# Patient Record
Sex: Female | Born: 1960
Health system: Southern US, Community
[De-identification: ages and names within clinical notes are randomized; demographics above are authoritative.]

## PROBLEM LIST (undated history)

## (undated) DIAGNOSIS — K219 Gastro-esophageal reflux disease without esophagitis: Secondary | ICD-10-CM

## (undated) DIAGNOSIS — E119 Type 2 diabetes mellitus without complications: Secondary | ICD-10-CM

## (undated) DIAGNOSIS — R011 Cardiac murmur, unspecified: Secondary | ICD-10-CM

## (undated) DIAGNOSIS — M1990 Unspecified osteoarthritis, unspecified site: Secondary | ICD-10-CM

## (undated) DIAGNOSIS — C50919 Malignant neoplasm of unspecified site of unspecified female breast: Secondary | ICD-10-CM

## (undated) DIAGNOSIS — I10 Essential (primary) hypertension: Secondary | ICD-10-CM

## (undated) DIAGNOSIS — E785 Hyperlipidemia, unspecified: Secondary | ICD-10-CM

## (undated) DIAGNOSIS — D649 Anemia, unspecified: Secondary | ICD-10-CM

## (undated) DIAGNOSIS — J45909 Unspecified asthma, uncomplicated: Secondary | ICD-10-CM

## (undated) DIAGNOSIS — N3289 Other specified disorders of bladder: Secondary | ICD-10-CM

## (undated) HISTORY — DX: Essential (primary) hypertension: I10

## (undated) HISTORY — DX: Gastro-esophageal reflux disease without esophagitis: K21.9

## (undated) HISTORY — DX: Malignant neoplasm of unspecified site of unspecified female breast: C50.919

## (undated) HISTORY — DX: Unspecified osteoarthritis, unspecified site: M19.90

## (undated) HISTORY — PX: PARTIAL HYSTERECTOMY: SHX80

## (undated) HISTORY — PX: BACK SURGERY: SHX140

## (undated) HISTORY — DX: Hyperlipidemia, unspecified: E78.5

## (undated) HISTORY — DX: Other specified disorders of bladder: N32.89

---
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
## 1996-06-14 HISTORY — PX: CHOLECYSTECTOMY: SHX55

## 1997-09-14 ENCOUNTER — Other Ambulatory Visit: Admission: RE | Admit: 1997-09-14 | Discharge: 1997-09-14 | Payer: Self-pay | Admitting: Family Medicine

## 1998-05-30 ENCOUNTER — Ambulatory Visit (HOSPITAL_COMMUNITY): Admission: RE | Admit: 1998-05-30 | Discharge: 1998-05-30 | Payer: Self-pay | Admitting: Neurosurgery

## 1998-05-30 ENCOUNTER — Encounter: Payer: Self-pay | Admitting: Neurosurgery

## 1998-11-11 ENCOUNTER — Other Ambulatory Visit: Admission: RE | Admit: 1998-11-11 | Discharge: 1998-11-11 | Payer: Self-pay

## 2000-05-02 ENCOUNTER — Other Ambulatory Visit: Admission: RE | Admit: 2000-05-02 | Discharge: 2000-05-02 | Payer: Self-pay | Admitting: Gynecology

## 2002-06-05 ENCOUNTER — Emergency Department (HOSPITAL_COMMUNITY): Admission: EM | Admit: 2002-06-05 | Discharge: 2002-06-06 | Payer: Self-pay | Admitting: Emergency Medicine

## 2003-02-01 ENCOUNTER — Other Ambulatory Visit: Admission: RE | Admit: 2003-02-01 | Discharge: 2003-02-01 | Payer: Self-pay | Admitting: Gynecology

## 2003-05-07 ENCOUNTER — Ambulatory Visit (HOSPITAL_COMMUNITY): Admission: RE | Admit: 2003-05-07 | Discharge: 2003-05-07 | Payer: Self-pay | Admitting: Anesthesiology

## 2004-09-18 ENCOUNTER — Inpatient Hospital Stay (HOSPITAL_COMMUNITY): Admission: RE | Admit: 2004-09-18 | Discharge: 2004-09-21 | Payer: Self-pay | Admitting: Neurosurgery

## 2005-01-16 ENCOUNTER — Ambulatory Visit: Payer: Self-pay | Admitting: Family Medicine

## 2005-04-30 ENCOUNTER — Encounter: Admission: RE | Admit: 2005-04-30 | Discharge: 2005-04-30 | Payer: Self-pay | Admitting: Neurosurgery

## 2006-06-07 ENCOUNTER — Emergency Department (HOSPITAL_COMMUNITY): Admission: EM | Admit: 2006-06-07 | Discharge: 2006-06-07 | Payer: Self-pay | Admitting: Emergency Medicine

## 2006-06-10 ENCOUNTER — Ambulatory Visit: Payer: Self-pay | Admitting: Internal Medicine

## 2006-06-10 LAB — CONVERTED CEMR LAB
ALT: 77 units/L — ABNORMAL HIGH (ref 0–40)
AST: 39 units/L — ABNORMAL HIGH (ref 0–37)
BUN: 11 mg/dL (ref 6–23)
Basophils Absolute: 0 10*3/uL (ref 0.0–0.1)
Basophils Relative: 0.9 % (ref 0.0–1.0)
CO2: 29 meq/L (ref 19–32)
Calcium: 9.4 mg/dL (ref 8.4–10.5)
Chloride: 103 meq/L (ref 96–112)
Chol/HDL Ratio, serum: 4.9
Cholesterol: 193 mg/dL (ref 0–200)
Creatinine, Ser: 0.8 mg/dL (ref 0.4–1.2)
Eosinophil percent: 2 % (ref 0.0–5.0)
FSH: 69.3 milliintl units/mL
GFR calc non Af Amer: 82 mL/min
Glomerular Filtration Rate, Af Am: 99 mL/min/{1.73_m2}
Glucose, Bld: 109 mg/dL — ABNORMAL HIGH (ref 70–99)
HCT: 43.2 % (ref 36.0–46.0)
HDL: 39.7 mg/dL (ref >39.0)
Hemoglobin: 15.2 g/dL — ABNORMAL HIGH (ref 12.0–15.0)
LDL Cholesterol: 128 mg/dL — ABNORMAL HIGH (ref 0–99)
Lymphocytes Relative: 29.7 % (ref 12.0–46.0)
MCHC: 35.2 g/dL (ref 30.0–36.0)
MCV: 93.8 fL (ref 78.0–100.0)
Monocytes Absolute: 0.3 10*3/uL (ref 0.2–0.7)
Monocytes Relative: 5.8 % (ref 3.0–11.0)
Neutro Abs: 2.8 10*3/uL (ref 1.4–7.7)
Neutrophils Relative %: 61.6 % (ref 43.0–77.0)
Platelets: 191 10*3/uL (ref 150–400)
Potassium: 4.3 meq/L (ref 3.5–5.1)
RBC: 4.6 M/uL (ref 3.87–5.11)
RDW: 12 % (ref 11.5–14.6)
Sodium: 140 meq/L (ref 135–145)
TSH: 1.7 microintl units/mL (ref 0.35–5.50)
Triglyceride fasting, serum: 129 mg/dL (ref 0–149)
VLDL: 26 mg/dL (ref 0–40)
WBC: 4.6 10*3/uL (ref 4.5–10.5)

## 2006-08-03 ENCOUNTER — Ambulatory Visit: Payer: Self-pay | Admitting: Internal Medicine

## 2006-08-03 LAB — CONVERTED CEMR LAB
ALT: 17 units/L (ref 0–40)
AST: 16 units/L (ref 0–37)
Albumin: 3.6 g/dL (ref 3.5–5.2)
Alkaline Phosphatase: 52 units/L (ref 39–117)
BUN: 10 mg/dL (ref 6–23)
Basophils Absolute: 0 10*3/uL (ref 0.0–0.1)
Basophils Relative: 0.8 % (ref 0.0–1.0)
Bilirubin, Direct: 0.2 mg/dL (ref 0.0–0.3)
CO2: 28 meq/L (ref 19–32)
Calcium: 9.3 mg/dL (ref 8.4–10.5)
Chloride: 105 meq/L (ref 96–112)
Cholesterol: 229 mg/dL (ref 0–200)
Creatinine, Ser: 0.7 mg/dL (ref 0.4–1.2)
Direct LDL: 175.7 mg/dL
Eosinophils Absolute: 0.1 10*3/uL (ref 0.0–0.6)
Eosinophils Relative: 1.2 % (ref 0.0–5.0)
GFR calc Af Amer: 115 mL/min
GFR calc non Af Amer: 95 mL/min
Glucose, Bld: 114 mg/dL — ABNORMAL HIGH (ref 70–99)
HCT: 44.3 % (ref 36.0–46.0)
HDL: 37.6 mg/dL — ABNORMAL LOW (ref >39.0)
Hemoglobin: 15.3 g/dL — ABNORMAL HIGH (ref 12.0–15.0)
Lymphocytes Relative: 21.7 % (ref 12.0–46.0)
MCHC: 34.4 g/dL (ref 30.0–36.0)
MCV: 94.8 fL (ref 78.0–100.0)
Monocytes Absolute: 0.4 10*3/uL (ref 0.2–0.7)
Monocytes Relative: 6.5 % (ref 3.0–11.0)
Neutro Abs: 4.2 10*3/uL (ref 1.4–7.7)
Neutrophils Relative %: 69.8 % (ref 43.0–77.0)
Platelets: 208 10*3/uL (ref 150–400)
Potassium: 4.1 meq/L (ref 3.5–5.1)
RBC: 4.68 M/uL (ref 3.87–5.11)
RDW: 12.8 % (ref 11.5–14.6)
Sodium: 140 meq/L (ref 135–145)
Total Bilirubin: 0.7 mg/dL (ref 0.3–1.2)
Total CHOL/HDL Ratio: 6.1
Total Protein: 5.9 g/dL — ABNORMAL LOW (ref 6.0–8.3)
Triglycerides: 148 mg/dL (ref 0–149)
VLDL: 30 mg/dL (ref 0–40)
WBC: 6 10*3/uL (ref 4.5–10.5)

## 2006-08-17 ENCOUNTER — Ambulatory Visit: Payer: Self-pay | Admitting: Internal Medicine

## 2006-08-30 ENCOUNTER — Ambulatory Visit: Payer: Self-pay | Admitting: Internal Medicine

## 2006-12-22 ENCOUNTER — Emergency Department (HOSPITAL_COMMUNITY): Admission: EM | Admit: 2006-12-22 | Discharge: 2006-12-22 | Payer: Self-pay | Admitting: Emergency Medicine

## 2007-01-04 DIAGNOSIS — K219 Gastro-esophageal reflux disease without esophagitis: Secondary | ICD-10-CM | POA: Insufficient documentation

## 2007-01-04 DIAGNOSIS — K8020 Calculus of gallbladder without cholecystitis without obstruction: Secondary | ICD-10-CM | POA: Insufficient documentation

## 2007-03-24 ENCOUNTER — Ambulatory Visit: Payer: Self-pay | Admitting: Internal Medicine

## 2007-03-24 DIAGNOSIS — N959 Unspecified menopausal and perimenopausal disorder: Secondary | ICD-10-CM | POA: Insufficient documentation

## 2007-03-24 DIAGNOSIS — G894 Chronic pain syndrome: Secondary | ICD-10-CM | POA: Insufficient documentation

## 2007-03-29 ENCOUNTER — Telehealth: Payer: Self-pay | Admitting: Internal Medicine

## 2007-04-06 ENCOUNTER — Telehealth: Payer: Self-pay | Admitting: Internal Medicine

## 2007-04-10 ENCOUNTER — Encounter: Payer: Self-pay | Admitting: Internal Medicine

## 2007-04-12 ENCOUNTER — Emergency Department (HOSPITAL_COMMUNITY): Admission: EM | Admit: 2007-04-12 | Discharge: 2007-04-12 | Payer: Self-pay | Admitting: Emergency Medicine

## 2007-04-13 ENCOUNTER — Emergency Department (HOSPITAL_COMMUNITY): Admission: EM | Admit: 2007-04-13 | Discharge: 2007-04-13 | Payer: Self-pay | Admitting: Emergency Medicine

## 2007-05-18 ENCOUNTER — Ambulatory Visit: Payer: Self-pay | Admitting: Internal Medicine

## 2007-05-19 LAB — CONVERTED CEMR LAB
Cholesterol: 252 mg/dL (ref 0–200)
Direct LDL: 189.9 mg/dL
HDL: 42 mg/dL (ref >39.0)
Total CHOL/HDL Ratio: 6
Triglycerides: 150 mg/dL — ABNORMAL HIGH (ref 0–149)
VLDL: 30 mg/dL (ref 0–40)

## 2007-06-15 HISTORY — PX: LEG SURGERY: SHX1003

## 2007-08-11 ENCOUNTER — Ambulatory Visit: Payer: Self-pay | Admitting: Internal Medicine

## 2007-08-21 ENCOUNTER — Telehealth: Payer: Self-pay | Admitting: Internal Medicine

## 2007-08-28 ENCOUNTER — Other Ambulatory Visit: Admission: RE | Admit: 2007-08-28 | Discharge: 2007-08-28 | Payer: Self-pay | Admitting: Gynecology

## 2007-09-30 ENCOUNTER — Inpatient Hospital Stay (HOSPITAL_COMMUNITY): Admission: EM | Admit: 2007-09-30 | Discharge: 2007-10-01 | Payer: Self-pay | Admitting: Emergency Medicine

## 2007-09-30 ENCOUNTER — Ambulatory Visit: Payer: Self-pay | Admitting: Internal Medicine

## 2007-09-30 DIAGNOSIS — F17200 Nicotine dependence, unspecified, uncomplicated: Secondary | ICD-10-CM | POA: Insufficient documentation

## 2007-10-02 ENCOUNTER — Ambulatory Visit: Payer: Self-pay | Admitting: Internal Medicine

## 2007-10-23 ENCOUNTER — Telehealth: Payer: Self-pay | Admitting: Internal Medicine

## 2008-04-26 ENCOUNTER — Inpatient Hospital Stay (HOSPITAL_COMMUNITY): Admission: EM | Admit: 2008-04-26 | Discharge: 2008-04-30 | Payer: Self-pay | Admitting: Emergency Medicine

## 2008-11-18 ENCOUNTER — Ambulatory Visit: Payer: Self-pay | Admitting: Women's Health

## 2008-11-18 ENCOUNTER — Encounter: Payer: Self-pay | Admitting: Women's Health

## 2008-11-18 ENCOUNTER — Other Ambulatory Visit: Admission: RE | Admit: 2008-11-18 | Discharge: 2008-11-18 | Payer: Self-pay | Admitting: Gynecology

## 2008-11-29 ENCOUNTER — Ambulatory Visit: Payer: Self-pay | Admitting: Internal Medicine

## 2008-11-29 DIAGNOSIS — I10 Essential (primary) hypertension: Secondary | ICD-10-CM | POA: Insufficient documentation

## 2008-12-10 LAB — CONVERTED CEMR LAB
BUN: 13 mg/dL (ref 6–23)
CO2: 29 meq/L (ref 19–32)
Calcium: 9.1 mg/dL (ref 8.4–10.5)
Chloride: 102 meq/L (ref 96–112)
Cholesterol: 227 mg/dL — ABNORMAL HIGH (ref 0–200)
Creatinine, Ser: 0.7 mg/dL (ref 0.4–1.2)
Direct LDL: 160.7 mg/dL
GFR calc non Af Amer: 93.98 mL/min (ref >60)
Glucose, Bld: 104 mg/dL — ABNORMAL HIGH (ref 70–99)
HDL: 33.9 mg/dL — ABNORMAL LOW (ref >39.00)
Potassium: 4.4 meq/L (ref 3.5–5.1)
Sodium: 139 meq/L (ref 135–145)
Total CHOL/HDL Ratio: 7
Triglycerides: 249 mg/dL — ABNORMAL HIGH (ref 0.0–149.0)
VLDL: 49.8 mg/dL — ABNORMAL HIGH (ref 0.0–40.0)

## 2009-01-06 ENCOUNTER — Emergency Department (HOSPITAL_COMMUNITY): Admission: EM | Admit: 2009-01-06 | Discharge: 2009-01-06 | Payer: Self-pay | Admitting: Emergency Medicine

## 2009-01-10 ENCOUNTER — Ambulatory Visit: Payer: Self-pay | Admitting: Internal Medicine

## 2009-02-13 ENCOUNTER — Encounter: Payer: Self-pay | Admitting: Internal Medicine

## 2009-03-25 ENCOUNTER — Telehealth: Payer: Self-pay | Admitting: Internal Medicine

## 2009-05-07 ENCOUNTER — Ambulatory Visit: Payer: Self-pay | Admitting: Internal Medicine

## 2009-05-14 ENCOUNTER — Telehealth: Payer: Self-pay | Admitting: Internal Medicine

## 2009-05-14 LAB — CONVERTED CEMR LAB
ALT: 15 units/L (ref 0–35)
AST: 13 units/L (ref 0–37)
Albumin: 3.6 g/dL (ref 3.5–5.2)
Alkaline Phosphatase: 64 units/L (ref 39–117)
BUN: 16 mg/dL (ref 6–23)
Bilirubin, Direct: 0 mg/dL (ref 0.0–0.3)
CO2: 30 meq/L (ref 19–32)
Calcium: 9.6 mg/dL (ref 8.4–10.5)
Chloride: 102 meq/L (ref 96–112)
Cholesterol: 147 mg/dL (ref 0–200)
Creatinine, Ser: 1.3 mg/dL — ABNORMAL HIGH (ref 0.4–1.2)
Direct LDL: 86.7 mg/dL
GFR calc non Af Amer: 45.92 mL/min (ref >60)
Glucose, Bld: 116 mg/dL — ABNORMAL HIGH (ref 70–99)
HDL: 32.7 mg/dL — ABNORMAL LOW (ref >39.00)
Potassium: 4.7 meq/L (ref 3.5–5.1)
Sodium: 140 meq/L (ref 135–145)
Total Bilirubin: 0.6 mg/dL (ref 0.3–1.2)
Total CHOL/HDL Ratio: 4
Total Protein: 6.8 g/dL (ref 6.0–8.3)
Triglycerides: 229 mg/dL — ABNORMAL HIGH (ref 0.0–149.0)
VLDL: 45.8 mg/dL — ABNORMAL HIGH (ref 0.0–40.0)

## 2009-08-04 ENCOUNTER — Encounter: Payer: Self-pay | Admitting: Internal Medicine

## 2009-08-05 ENCOUNTER — Ambulatory Visit: Payer: Self-pay | Admitting: Internal Medicine

## 2009-08-05 LAB — CONVERTED CEMR LAB
Blood in Urine, dipstick: NEGATIVE
Glucose, Urine, Semiquant: NEGATIVE
Ketones, urine, test strip: NEGATIVE
Nitrite: NEGATIVE
Protein, U semiquant: NEGATIVE
Specific Gravity, Urine: 1.025
Urobilinogen, UA: 1
WBC Urine, dipstick: NEGATIVE
pH: 6

## 2009-08-08 ENCOUNTER — Encounter: Admission: RE | Admit: 2009-08-08 | Discharge: 2009-08-08 | Payer: Self-pay | Admitting: Internal Medicine

## 2009-08-11 LAB — CONVERTED CEMR LAB
ALT: 19 units/L (ref 0–35)
AST: 18 units/L (ref 0–37)
Albumin: 3.5 g/dL (ref 3.5–5.2)
Alkaline Phosphatase: 67 units/L (ref 39–117)
BUN: 13 mg/dL (ref 6–23)
Basophils Absolute: 0 10*3/uL (ref 0.0–0.1)
Basophils Relative: 0.7 % (ref 0.0–3.0)
Bilirubin, Direct: 0.2 mg/dL (ref 0.0–0.3)
CO2: 31 meq/L (ref 19–32)
Calcium: 9.3 mg/dL (ref 8.4–10.5)
Chloride: 103 meq/L (ref 96–112)
Cholesterol: 137 mg/dL (ref 0–200)
Creatinine, Ser: 1.3 mg/dL — ABNORMAL HIGH (ref 0.4–1.2)
Eosinophils Absolute: 0.1 10*3/uL (ref 0.0–0.7)
Eosinophils Relative: 1.8 % (ref 0.0–5.0)
GFR calc non Af Amer: 45.88 mL/min (ref >60)
Glucose, Bld: 108 mg/dL — ABNORMAL HIGH (ref 70–99)
HCT: 42.6 % (ref 36.0–46.0)
HDL: 41 mg/dL (ref >39.00)
Hemoglobin: 14.1 g/dL (ref 12.0–15.0)
LDL Cholesterol: 61 mg/dL (ref 0–99)
Lymphocytes Relative: 20.8 % (ref 12.0–46.0)
Lymphs Abs: 1.2 10*3/uL (ref 0.7–4.0)
MCHC: 33.2 g/dL (ref 30.0–36.0)
MCV: 95.1 fL (ref 78.0–100.0)
Monocytes Absolute: 0.2 10*3/uL (ref 0.1–1.0)
Monocytes Relative: 3.7 % (ref 3.0–12.0)
Neutro Abs: 4.1 10*3/uL (ref 1.4–7.7)
Neutrophils Relative %: 73 % (ref 43.0–77.0)
Platelets: 167 10*3/uL (ref 150.0–400.0)
Potassium: 4.7 meq/L (ref 3.5–5.1)
RBC: 4.48 M/uL (ref 3.87–5.11)
RDW: 13.1 % (ref 11.5–14.6)
Sodium: 142 meq/L (ref 135–145)
TSH: 1.72 microintl units/mL (ref 0.35–5.50)
Total Bilirubin: 0.4 mg/dL (ref 0.3–1.2)
Total CHOL/HDL Ratio: 3
Total Protein: 6.9 g/dL (ref 6.0–8.3)
Triglycerides: 177 mg/dL — ABNORMAL HIGH (ref 0.0–149.0)
VLDL: 35.4 mg/dL (ref 0.0–40.0)
WBC: 5.6 10*3/uL (ref 4.5–10.5)

## 2009-09-09 ENCOUNTER — Ambulatory Visit: Payer: Self-pay | Admitting: Internal Medicine

## 2009-09-09 LAB — CONVERTED CEMR LAB
BUN: 13 mg/dL (ref 6–23)
CO2: 30 meq/L (ref 19–32)
Calcium: 8.9 mg/dL (ref 8.4–10.5)
Chloride: 103 meq/L (ref 96–112)
Creatinine, Ser: 1.2 mg/dL (ref 0.4–1.2)
GFR calc non Af Amer: 50.3 mL/min (ref >60)
Glucose, Bld: 113 mg/dL — ABNORMAL HIGH (ref 70–99)
Potassium: 4.9 meq/L (ref 3.5–5.1)
Sodium: 142 meq/L (ref 135–145)

## 2009-10-12 LAB — CONVERTED CEMR LAB: Pap Smear: NORMAL

## 2009-11-04 ENCOUNTER — Ambulatory Visit: Payer: Self-pay | Admitting: Internal Medicine

## 2009-11-05 ENCOUNTER — Encounter (INDEPENDENT_AMBULATORY_CARE_PROVIDER_SITE_OTHER): Payer: Self-pay | Admitting: *Deleted

## 2009-11-05 LAB — CONVERTED CEMR LAB
BUN: 15 mg/dL (ref 6–23)
CO2: 28 meq/L (ref 19–32)
Calcium: 8.9 mg/dL (ref 8.4–10.5)
Chloride: 105 meq/L (ref 96–112)
Creatinine, Ser: 1 mg/dL (ref 0.4–1.2)
GFR calc non Af Amer: 65.82 mL/min (ref >60)
Glucose, Bld: 100 mg/dL — ABNORMAL HIGH (ref 70–99)
Potassium: 4.9 meq/L (ref 3.5–5.1)
Sodium: 139 meq/L (ref 135–145)

## 2009-11-12 ENCOUNTER — Telehealth: Payer: Self-pay | Admitting: Internal Medicine

## 2009-11-17 ENCOUNTER — Encounter (INDEPENDENT_AMBULATORY_CARE_PROVIDER_SITE_OTHER): Payer: Self-pay | Admitting: *Deleted

## 2009-11-19 ENCOUNTER — Ambulatory Visit: Payer: Self-pay | Admitting: Gastroenterology

## 2010-02-05 ENCOUNTER — Ambulatory Visit: Payer: Self-pay | Admitting: Family Medicine

## 2010-03-12 ENCOUNTER — Other Ambulatory Visit: Admission: RE | Admit: 2010-03-12 | Discharge: 2010-03-12 | Payer: Self-pay | Admitting: Gynecology

## 2010-03-12 ENCOUNTER — Ambulatory Visit: Payer: Self-pay | Admitting: Women's Health

## 2010-04-20 ENCOUNTER — Ambulatory Visit: Payer: Self-pay | Admitting: Internal Medicine

## 2010-04-20 DIAGNOSIS — M542 Cervicalgia: Secondary | ICD-10-CM | POA: Insufficient documentation

## 2010-04-21 LAB — CONVERTED CEMR LAB
ALT: 16 units/L (ref 0–35)
AST: 14 units/L (ref 0–37)
Albumin: 3.1 g/dL — ABNORMAL LOW (ref 3.5–5.2)
Alkaline Phosphatase: 69 units/L (ref 39–117)
BUN: 13 mg/dL (ref 6–23)
Bilirubin, Direct: 0.2 mg/dL (ref 0.0–0.3)
CO2: 30 meq/L (ref 19–32)
Calcium: 9.1 mg/dL (ref 8.4–10.5)
Chloride: 105 meq/L (ref 96–112)
Cholesterol: 97 mg/dL (ref 0–200)
Creatinine, Ser: 0.9 mg/dL (ref 0.4–1.2)
GFR calc non Af Amer: 70.84 mL/min (ref >60)
Glucose, Bld: 105 mg/dL — ABNORMAL HIGH (ref 70–99)
HDL: 32.1 mg/dL — ABNORMAL LOW (ref >39.00)
LDL Cholesterol: 49 mg/dL (ref 0–99)
Potassium: 4.6 meq/L (ref 3.5–5.1)
Sodium: 143 meq/L (ref 135–145)
Total Bilirubin: 0.5 mg/dL (ref 0.3–1.2)
Total CHOL/HDL Ratio: 3
Total Protein: 5.5 g/dL — ABNORMAL LOW (ref 6.0–8.3)
Triglycerides: 78 mg/dL (ref 0.0–149.0)
VLDL: 15.6 mg/dL (ref 0.0–40.0)

## 2010-07-16 NOTE — Assessment & Plan Note (Signed)
Summary: fup mri-ok per ellen//ccm   Vital Signs:  Patient profile:   50 year old female Pulse rate:   78 / minute Resp:     12 per minute BP standing:   118 / 70  (left arm) Cuff size:   regular  Vitals Entered By: Gladis Riffle, RN (August 05, 2009 11:27 AM) CC: review MRI--has disc and we have paper report Is Patient Diabetic? No   Primary Care Provider:  Birdie Sons MD  CC:  review MRI--has disc and we have paper report.  History of Present Illness: [facility] ordered MRI---see report-reviewed with patient and husband. incidental finding of small right kidney  Preventive Screening-Counseling & Management  Alcohol-Tobacco     Smoking Status: current     Packs/Day: 1.0  Medications Prior to Update: 1)  Lyrica 75 Mg  Caps (Pregabalin) .... One By Mouth Qid 2)  Estradiol 2 Mg Tabs (Estradiol) .... Take 1 Tablet By Mouth Once A Day 3)  Zovirax 5 %  Crea (Acyclovir) .... Apply Qid For 5 Days. As Needed 4)  Oxycontin 40 Mg  Tb12 (Oxycodone Hcl) .... Take 1 Tablet By Mouth Three Times A Day For Chronic Pain 5)  Oxycodone-Acetaminophen 5-325 Mg  Tabs (Oxycodone-Acetaminophen) .... One Every 6-8 Hours As Needed Breakthrough Pain 6)  Provigil 200 Mg  Tabs (Modafinil) .... One and A Half Every Morning 7)  Lisinopril 20 Mg Tabs (Lisinopril) .Marland Kitchen.. 1 Tablet By Mouth Daily 8)  Lipitor 80 Mg Tabs (Atorvastatin Calcium) .... Take 1 Tab By Mouth Daily or As Direction 9)  Protonix 40 Mg  Tbec (Pantoprazole Sodium) .... Once Daily  Allergies: 1)  ! * Simvastatin 2)  Codeine Phosphate (Codeine Phosphate) 3)  Sulfamethoxazole (Sulfamethoxazole)  Past History:  Past Medical History: Last updated: 11/29/2008 Cholelithiasis GERD Hyperlipidemia Hypertension  Past Surgical History: Last updated: 11/29/2008 Cholecystectomy Hysterectomy ankle fracture  Family History: Last updated: 01/04/2007 Family History Diabetes 1st degree relative Family History  Hypertension Fam hx CAD  Social History: Last updated: 01/04/2007 Former Smoker Alcohol use-no Drug use-no  Risk Factors: Smoking Status: current (08/05/2009) Packs/Day: 1.0 (08/05/2009)  Physical Exam  General:  Well-developed,well-nourished,in no acute distress; alert,appropriate and cooperative throughout examination Head:  normocephalic and atraumatic.   Eyes:  pupils equal and pupils round.   Ears:  R ear normal and L ear normal.   Neck:  No deformities, masses, or tenderness noted. Chest Wall:  No deformities, masses, or tenderness noted. Lungs:  Normal respiratory effort, chest expands symmetrically. Lungs are clear to auscultation, no crackles or wheezes. Heart:  Normal rate and regular rhythm. S1 and S2 normal without gallop, murmur, click, rub or other extra sounds. Abdomen:  Bowel sounds positive,abdomen soft and non-tender without masses, organomegaly or hernias noted. overweight Msk:  No deformity or scoliosis noted of thoracic or lumbar spine.   Pulses:  R radial normal and L radial normal.   Extremities:  no edema Neurologic:  cranial nerves II-XII intact and gait normal.     Impression & Recommendations:  Problem # 1:  NONSPECIFIC ABN FINDING RAD & OTH EXAM [facility] (ICD-793.5) incidental finding of atrophic right kidney.  Needs further evaluation.  Will check ultrasound of the kidney.  Etiology is unclear.  Suspect atherosclerotic disease.  Advised complete smoking cessation. This was discussed with the patient and her husband. Orders: Venipuncture (78295) UA Dipstick w/o Micro (automated)  (81003) Radiology Referral (Radiology) TLB-BMP (Basic Metabolic Panel-BMET) (80048-METABOL) TLB-TSH (Thyroid Stimulating Hormone) (84443-TSH) TLB-Hepatic/Liver Function Pnl (  80076-HEPATIC) TLB-CBC Platelet - w/Differential (85025-CBCD) TLB-Lipid Panel (80061-LIPID)  Problem # 2:  TOBACCO USE (ICD-305.1)  Encouraged smoking cessation and discussed different methods  for smoking cessation.   Problem # 3:  HYPERTENSION (ICD-401.9) well-controlled.  Continue current medications. Her updated medication list for this problem includes:    Lisinopril 20 Mg Tabs (Lisinopril) .Marland Kitchen... 1 tablet by mouth daily  BP today: 118/70 Prior BP: 108/64 (05/07/2009)  Labs Reviewed: K+: 4.7 (05/07/2009) Creat: : 1.3 (05/07/2009)   Chol: 147 (05/07/2009)   HDL: 32.70 (05/07/2009)   LDL: DEL (05/18/2007)   TG: 229.0 (05/07/2009)  Complete Medication List: 1)  Lyrica 75 Mg Caps (Pregabalin) .... One by mouth qid 2)  Estradiol 2 Mg Tabs (Estradiol) .... Take 1 tablet by mouth once a day 3)  Zovirax 5 % Crea (Acyclovir) .... Apply qid for 5 days. as needed 4)  Oxycontin 40 Mg Tb12 (Oxycodone hcl) .... Take 1 tablet by mouth three times a day for chronic pain 5)  Oxycodone-acetaminophen 5-325 Mg Tabs (Oxycodone-acetaminophen) .... One every 6-8 hours as needed breakthrough pain 6)  Provigil 200 Mg Tabs (Modafinil) .... One and a half every morning 7)  Lisinopril 20 Mg Tabs (Lisinopril) .Marland Kitchen.. 1 tablet by mouth daily 8)  Lipitor 80 Mg Tabs (Atorvastatin calcium) .... Take 1 tab by mouth daily or as direction 9)  Protonix 40 Mg Tbec (Pantoprazole sodium) .... Once daily Prescriptions: LIPITOR 80 MG TABS (ATORVASTATIN CALCIUM) Take 1 tab by mouth daily or as direction  #90 x 3   Entered by:   Gladis Riffle, RN   Authorized by:   Birdie Sons MD   Signed by:   Gladis Riffle, RN on 08/05/2009   Method used:   Electronically to        [facility]* (mail-order)             ,          Ph: [phone]       Fax: [phone]   RxID:   [id number]   Laboratory Results   Urine Tests  Date/Time Recieved: August 05, 2009 3:42 PM  Date/Time Reported: August 05, 2009 3:42 PM   Routine Urinalysis   Color: yellow Appearance: Clear Glucose: negative   (Normal Range: Negative) Bilirubin: 1+   (Normal Range: Negative) Ketone: negative   (Normal Range: Negative) Spec.  Gravity: 1.025   (Normal Range: 1.003-1.035) Blood: negative   (Normal Range: Negative) pH: 6.0   (Normal Range: 5.0-8.0) Protein: negative   (Normal Range: Negative) Urobilinogen: 1.0   (Normal Range: 0-1) Nitrite: negative   (Normal Range: Negative) Leukocyte Esterace: negative   (Normal Range: Negative)    Comments: Wynona Canes, CMA  August 05, 2009 3:42 PM

## 2010-07-16 NOTE — Assessment & Plan Note (Signed)
Summary: 2 month f/up /db rsc per pt/njr/pt rescd per dr/ccm  Medications Added ZOVIRAX 5 %  CREA (ACYCLOVIR) apply qid for 5 days.        Vital Signs:  Patient Profile:   50 Years Old Female Height:     64 inches Weight:      175 pounds Temp:     98.3 degrees F oral Pulse rate:   80 / minute BP sitting:   122 / 74  (left arm)  Vitals Entered By: Gladis Riffle, RN (May 18, 2007 10:51 AM)                 Chief Complaint:  2 month rov--states all meds stopped but lyrica and premarin and has headaches and is chilled at night.  History of Present Illness: menopausal sxs.  --on premarin-doing much better  chronic pain syndrome---doing ok on lyrica  Current Allergies (reviewed today): CODEINE PHOSPHATE (CODEINE PHOSPHATE) SULFAMETHOXAZOLE (SULFAMETHOXAZOLE)  Past Medical History:    Reviewed history from 01/04/2007 and no changes required:       Cholelithiasis       GERD       Hyperlipidemia  Past Surgical History:    Reviewed history from 01/04/2007 and no changes required:       Cholecystectomy       Hysterectomy   Family History:    Reviewed history from 01/04/2007 and no changes required:       Family History Diabetes 1st degree relative       Family History Hypertension       Fam hx CAD  Social History:    Reviewed history from 01/04/2007 and no changes required:       Former Smoker       Alcohol use-no       Drug use-no     Physical Exam  General:     Well-developed,well-nourished,in no acute distress; alert,appropriate and cooperative throughout examination Head:     normocephalic and atraumatic.   Eyes:     pupils equal and pupils round.   Ears:     R ear normal and L ear normal.   Neck:     No deformities, masses, or tenderness noted. Chest Wall:     No deformities, masses, or tenderness noted. Abdomen:     Bowel sounds positive,abdomen soft and non-tender without masses, organomegaly or hernias noted. Msk:     No deformity or  scoliosis noted of thoracic or lumbar spine.      Impression & Recommendations:  Problem # 1:  MENOPAUSAL DISORDER (ICD-627.9) doing well--- The following medications were removed from the medication list:    Premarin 0.625 Mg Tabs (Estrogens conjugated) .Marland Kitchen... Take one by mouth daily  Her updated medication list for this problem includes:    Premarin 0.3 Mg Tabs (Estrogens conjugated) ..... Once daily   Problem # 2:  SYNDROME, CHRONIC PAIN (ICD-338.4) currently no meds and doing ok. She continues to suffer with headaches.   Problem # 3:  GERD (ICD-530.81) no sxs.   Complete Medication List: 1)  Lyrica 75 Mg Caps (Pregabalin) .... One by mouth tid 2)  Premarin 0.3 Mg Tabs (Estrogens conjugated) .... Once daily 3)  Zovirax 5 % Crea (Acyclovir) .... Apply qid for 5 days.  Other Orders: Venipuncture (29528) TLB-Lipid Panel (80061-LIPID)   Patient Instructions: 1)  Please schedule a follow-up appointment in 4 months.    Prescriptions: ZOVIRAX 5 %  CREA (ACYCLOVIR) apply qid for 5 days.  #1  gram x 3   Entered and Authorized by:   Birdie Sons MD   Signed by:   Birdie Sons MD on 05/18/2007   Method used:   Electronically sent to ...       [facility]  [phone]*       [address]       [place], [place]  [address]       Ph: [phone] or [phone]       Fax: [phone]   RxID:   [id number]  ]

## 2010-07-16 NOTE — Assessment & Plan Note (Signed)
Summary: concerned about elevated BP/dm   Vital Signs:  Patient profile:   50 year old female Height:      64 inches Weight:      198.5 pounds BMI:     34.20 Temp:     98.4 degrees F Pulse rate:   88 / minute Pulse rhythm:   regular Resp:     20 per minute BP sitting:   178 / 98  (left arm)  Vitals Entered By: Gladis Riffle, RN (November 29, 2008 11:34 AM)  Nutrition Counseling: Patient's BMI is greater than 25 and therefore counseled on weight management options.  Primary Care Provider:  Birdie Sons MD   History of Present Illness: pt with multiple medical problems---noncompliant with followup She is concerned with Blood Pressure---she has noted elevated BP for 6 months (has been seen at various physician offices since accident). She is seeing Dr. Rayburn Ma for her fractured ankle (also [facility]) BP has been 130-17-/80-90s No associated sxs, no neurologic deficit  All other systems reviewed and were negative   Current Problems (verified): 1)  Tobacco Use  (ICD-305.1) 2)  Hyperlipidemia  (ICD-272.4) 3)  Syndrome, Chronic Pain  (ICD-338.4) 4)  Menopausal Disorder  (ICD-627.9) 5)  Family History Diabetes 1st Degree Relative  (ICD-V18.0) 6)  Gerd  (ICD-530.81) 7)  Cholelithiasis  (ICD-574.20)  Current Medications (verified): 1)  Lyrica 75 Mg  Caps (Pregabalin) .... One By Mouth Qid 2)  Estradiol 2 Mg Tabs (Estradiol) .... Take 1 Tablet By Mouth Once A Day 3)  Zovirax 5 %  Crea (Acyclovir) .... Apply Qid For 5 Days. As Needed 4)  Oxycontin 40 Mg  Tb12 (Oxycodone Hcl) .... Take 1 Tablet By Mouth Three Times A Day For Chronic Pain 5)  Oxycodone-Acetaminophen 5-325 Mg  Tabs (Oxycodone-Acetaminophen) .... One Every 6-8 Hours As Needed Breakthrough Pain 6)  Provigil 200 Mg  Tabs (Modafinil) .... One and A Half Every Morning  Allergies: 1)  ! * Simvastatin 2)  Codeine Phosphate (Codeine Phosphate) 3)  Sulfamethoxazole (Sulfamethoxazole)  Comments:  Nurse/Medical  Assistant: discuaa BP;  130/86-190/96--stopped lipitor due to cost The patient's medications and allergies were reviewed with the patient and were updated in the Medication and Allergy Lists. Gladis Riffle, RN (November 29, 2008 11:39 AM)  Past History:  Family History: Last updated: 01/04/2007 Family History Diabetes 1st degree relative Family History Hypertension Fam hx CAD  Social History: Last updated: 01/04/2007 Former Smoker Alcohol use-no Drug use-no  Past Medical History: Cholelithiasis GERD Hyperlipidemia Hypertension  Past Surgical History: Cholecystectomy Hysterectomy ankle fracture  Physical Exam  General:  Well-developed,well-nourished,in no acute distress; alert,appropriate and cooperative throughout examination Head:  normocephalic and atraumatic.   Eyes:  pupils equal and pupils round.   Ears:  R ear normal and L ear normal.   Neck:  No deformities, masses, or tenderness noted. Chest Wall:  No deformities, masses, or tenderness noted. Lungs:  Normal respiratory effort, chest expands symmetrically. Lungs are clear to auscultation, no crackles or wheezes. Heart:  Normal rate and regular rhythm. S1 and S2 normal without gallop, murmur, click, rub or other extra sounds. Abdomen:  Bowel sounds positive,abdomen soft and non-tender without masses, organomegaly or hernias noted. Msk:  No deformity or scoliosis noted of thoracic or lumbar spine.   ankle brace left ankle Pulses:  R radial normal and L radial normal.     Impression & Recommendations:  Problem # 1:  HYPERTENSION (ICD-401.9)  not controlled BP today: 178/98 Prior BP: 100/70 (10/02/2007)  Labs Reviewed: K+: 4.1 (08/03/2006) Creat: : 0.7 (08/03/2006)   Chol: 252 (05/18/2007)   HDL: 42.0 (05/18/2007)   LDL: DEL (05/18/2007)   TG: 150 (05/18/2007)  Orders: TLB-BMP (Basic Metabolic Panel-BMET) (80048-METABOL)  Her updated medication list for this problem includes:    Lisinopril-hydrochlorothiazide  20-12.5 Mg Tabs (Lisinopril-hydrochlorothiazide) .Marland Kitchen... 1 tablet by mouth daily  Problem # 2:  TOBACCO USE (ICD-305.1)  Encouraged smoking cessation and discussed different methods for smoking cessation.   Problem # 3:  HYPERLIPIDEMIA (ICD-272.4)  noncomplaint with meds needs labs  The following medications were removed from the medication list:    Lipitor 40 Mg Tabs (Atorvastatin calcium) .Marland Kitchen... 1/2 every other day  Labs Reviewed: SGOT: 16 (08/03/2006)   SGPT: 17 (08/03/2006)   HDL:42.0 (05/18/2007), 37.6 (08/03/2006)  LDL:DEL (05/18/2007), DEL (08/03/2006)  Chol:252 (05/18/2007), 229 (08/03/2006)  Trig:150 (05/18/2007), 148 (08/03/2006)  Orders: Venipuncture (16109) TLB-Lipid Panel (80061-LIPID)  Complete Medication List: 1)  Lyrica 75 Mg Caps (Pregabalin) .... One by mouth qid 2)  Estradiol 2 Mg Tabs (Estradiol) .... Take 1 tablet by mouth once a day 3)  Zovirax 5 % Crea (Acyclovir) .... Apply qid for 5 days. as needed 4)  Oxycontin 40 Mg Tb12 (Oxycodone hcl) .... Take 1 tablet by mouth three times a day for chronic pain 5)  Oxycodone-acetaminophen 5-325 Mg Tabs (Oxycodone-acetaminophen) .... One every 6-8 hours as needed breakthrough pain 6)  Provigil 200 Mg Tabs (Modafinil) .... One and a half every morning 7)  Lisinopril-hydrochlorothiazide 20-12.5 Mg Tabs (Lisinopril-hydrochlorothiazide) .Marland Kitchen.. 1 tablet by mouth daily  Patient Instructions: 1)  6 weeks Prescriptions: LISINOPRIL-HYDROCHLOROTHIAZIDE 20-12.5 MG TABS (LISINOPRIL-HYDROCHLOROTHIAZIDE) 1 tablet by mouth daily  #90 x 3   Entered and Authorized by:   Birdie Sons MD   Signed by:   Birdie Sons MD on 11/29/2008   Method used:   [facility] to        [facility]  [phone][facility] (retail)       [address] [place], [place]  [address]       Ph: [phone] or [phone]       Fax: [phone]   RxID:   [id number]

## 2010-07-16 NOTE — Letter (Signed)
Summary: [employer]  [employer]   Imported By: Kassie Mends 10/17/2007 16:18:22  _____________________________________________________________________  External Attachment:    Type:   Image     Comment:   [employer]

## 2010-07-16 NOTE — Progress Notes (Signed)
Summary: nancy finish,Problems on Premarin   Phone Note Call from Patient Call back at [facility] Phone [phone]   Caller: Patient Call For: DR SWORDS Reason for Call: Acute Illness, Talk to Nurse Complaint: Headache Summary of Call: WAS SEEN ON 03/24/07 AND WAS GIVEN MEDS FOR HER HOT FLASHES. IT WAS 3MG . THAT DID NOT WORK. DR SWORDS INCREASED THE DOSAGE. NOW SHE IS GETTING BAD HEADACHES, THAT WON'T GO AWAY. SHE WENT BACK TO THE 3MG , HOPING THAT WOULD EASE HER HEADACHES. IT DID NOT. PLEASE ADVISE. Initial call taken by: Warnell Forester,  April 06, 2007 3:49 PM  Follow-up for Phone Call        10/10 OV, started on Premarin 0.3 mg and asked to increase to 0.625 mg on 19/15.  She developed headaches and went back down to 0.3 mg on 10/21 and her headache is getting worse, now feeling like a migraine, cannot get rid of the headache with tylenol, rest, etc. (Hotflashes were much better on the 0.625).  Pt plans to D/C the Premarin all together.  [facility] Follow-up by: Sid Falcon LPN,  April 06, 2007 4:34 PM  Additional Follow-up for Phone Call Additional follow up Details #1::        how is she today. if headache is still present then dcn 100 1 by mouth q6 hours as needed #20/0 Additional Follow-up by: Birdie Sons MD,  April 07, 2007 8:13 AM    Additional Follow-up for Phone Call Additional follow up Details #2::    LMTCB, need pharmacy if headache is not better ..................................................................Marland KitchenSid Falcon LPN  April 07, 2007 8:21 AM Pt called back, she already has Oxycodone and Oxycontin for pain

## 2010-07-16 NOTE — Letter (Signed)
Summary: [facility] Instructions  [facility]  [address] [place], [place] [address]   Phone: [phone]  Fax: [phone]       ITA FRITZSCHE    [birth year]    MRN: [id number]        Procedure Day Dorna Bloom: [facility]   12/10/09     Arrival Time: 10:30am      Procedure Time: 11:30am     Location of Procedure:                    _x _   [facility] (4th Floor)   PREPARATION FOR COLONOSCOPY WITH MOVIPREP   Starting 5 days prior to your procedure 12/05/09 do not eat nuts, seeds, popcorn, corn, beans, peas,  salads, or any raw vegetables.  Do not take any fiber supplements (e.g. Metamucil, Citrucel, and Benefiber).  THE DAY BEFORE YOUR PROCEDURE         DATE:  12/09/09   Tuesday  1.  Drink clear liquids the entire day-NO SOLID FOOD  2.  Do not drink anything colored red or purple.  Avoid juices with pulp.  No orange juice.  3.  Drink at least 64 oz. (8 glasses) of fluid/clear liquids during the day to prevent dehydration and help the prep work efficiently.  CLEAR LIQUIDS INCLUDE: Water Jello Ice Popsicles Tea (sugar ok, no milk/cream) Powdered fruit flavored drinks Coffee (sugar ok, no milk/cream) Gatorade Juice: apple, white grape, white cranberry  Lemonade Clear bullion, consomm, broth Carbonated beverages (any kind) Strained chicken noodle soup Hard Candy                             4.  In the morning, mix first dose of MoviPrep solution:    Empty 1 Pouch A and 1 Pouch B into the disposable container    Add lukewarm drinking water to the top line of the container. Mix to dissolve    Refrigerate (mixed solution should be used within 24 hrs)  5.  Begin drinking the prep at 5:00 p.m. The MoviPrep container is divided by 4 marks.   Every 15 minutes drink the solution down to the next mark (approximately 8 oz) until the full liter is complete.   6.  Follow completed prep with 16 oz of clear liquid of your choice (Nothing red or purple).   Continue to drink clear liquids until bedtime.  7.  Before going to bed, mix second dose of MoviPrep solution:    Empty 1 Pouch A and 1 Pouch B into the disposable container    Add lukewarm drinking water to the top line of the container. Mix to dissolve    Refrigerate  THE DAY OF YOUR PROCEDURE      DATE:   12/10/09   Wednesday  Beginning at  6:30am (5 hours before procedure):         1. Every 15 minutes, drink the solution down to the next mark (approx 8 oz) until the full liter is complete.  2. Follow completed prep with 16 oz. of clear liquid of your choice.    3. You may drink clear liquids until  9:30am  (Two Hours Before Your Procedure)   MEDICATION INSTRUCTIONS  Unless otherwise instructed, you should take regular prescription medications with a small sip of water   as early as possible the morning of your procedure.       OTHER INSTRUCTIONS  You will need  a responsible adult at least 52 years of age to accompany you and drive you home.   This person must remain in the waiting room during your procedure.  Wear loose fitting clothing that is easily removed.  Leave jewelry and other valuables at home.  However, you may wish to bring a book to read or  an iPod/MP3 player to listen to music as you wait for your procedure to start.  Remove all body piercing jewelry and leave at home.  Total time from sign-in until discharge is approximately 2-3 hours.  You should go home directly after your procedure and rest.  You can resume normal activities the  day after your procedure.  The day of your procedure you should not:   Drive   Make legal decisions   Operate machinery   Drink alcohol   Return to work  You will receive specific instructions about eating, activities and medications before you leave.    The above instructions have been reviewed and explained to me by  Wyona Almas RN  November 19, 2009 11:12 AM     I fully understand and can verbalize  these instructions _____________________________ Date _________

## 2010-07-16 NOTE — Assessment & Plan Note (Signed)
Summary: [name]/pt rsc/cjr   Vital Signs:  Patient profile:   50 year old female Weight:      194 pounds BMI:     33.42 Temp:     98.3 degrees F Pulse rate:   62 / minute Resp:     12 per minute BP sitting:   108 / 64  (left arm)  Vitals Entered By: Gladis Riffle, RN (May 07, 2009 7:48 AM)   Primary Care [name]:  Birdie Sons MD   History of Present Illness:  Follow-Up Visit      This is a 50 year old woman who presents for Follow-up visit.  The patient denies chest pain, palpitations, dizziness, syncope, edema, SOB, DOE, PND, and orthopnea.  Since the last visit the patient notes no new problems or concerns.  The patient reports taking meds as prescribed.  When questioned about possible medication side effects, the patient notes none.  She notes low BP---80-110/70s. She denies orthostatic sxs  All other systems reviewed and were negative   Preventive Screening-Counseling & Management  Alcohol-Tobacco     Smoking Status: current     Packs/Day: 1.0  Current Problems (verified): 1)  Hypertension  (ICD-401.9) 2)  Tobacco Use  (ICD-305.1) 3)  Hyperlipidemia  (ICD-272.4) 4)  Syndrome, Chronic Pain  (ICD-338.4) 5)  Menopausal Disorder  (ICD-627.9) 6)  Family History Diabetes 1st Degree Relative  (ICD-V18.0) 7)  Gerd  (ICD-530.81) 8)  Cholelithiasis  (ICD-574.20)  Current Medications (verified): 1)  Lyrica 75 Mg  Caps (Pregabalin) .... One By Mouth Qid 2)  Estradiol 2 Mg Tabs (Estradiol) .... Take 1 Tablet By Mouth Once A Day 3)  Zovirax 5 %  Crea (Acyclovir) .... Apply Qid For 5 Days. As Needed 4)  Oxycontin 40 Mg  Tb12 (Oxycodone Hcl) .... Take 1 Tablet By Mouth Three Times A Day For Chronic Pain 5)  Oxycodone-Acetaminophen 5-325 Mg  Tabs (Oxycodone-Acetaminophen) .... One Every 6-8 Hours As Needed Breakthrough Pain 6)  Provigil 200 Mg  Tabs (Modafinil) .... One and A Half Every Morning 7)  Lisinopril-Hydrochlorothiazide 20-12.5 Mg Tabs (Lisinopril-Hydrochlorothiazide)  .Marland Kitchen.. 1 Tablet By Mouth Daily 8)  Lipitor 80 Mg Tabs (Atorvastatin Calcium) .... Take 1 Tab By Mouth Daily or As Direction 9)  Protonix 40 Mg  Tbec (Pantoprazole Sodium) .... Once Daily  Allergies: 1)  ! * Simvastatin 2)  Codeine Phosphate (Codeine Phosphate) 3)  Sulfamethoxazole (Sulfamethoxazole)  Comments:  Nurse/Medical Assistant: recheck BP--BP 92/41-108/70 at home on wrist monitor--hers today 106/95 in office  The patient's medications and allergies were reviewed with the patient and were updated in the Medication and Allergy Lists. Gladis Riffle, RN (May 07, 2009 7:50 AM)  Past History:  Past Medical History: Last updated: 11/29/2008 Cholelithiasis GERD Hyperlipidemia Hypertension  Past Surgical History: Last updated: 11/29/2008 Cholecystectomy Hysterectomy ankle fracture  Family History: Last updated: 01/04/2007 Family History Diabetes 1st degree relative Family History Hypertension Fam hx CAD  Social History: Last updated: 01/04/2007 Former Smoker Alcohol use-no Drug use-no  Risk Factors: Smoking Status: current (05/07/2009) Packs/Day: 1.0 (05/07/2009)  Social History: Smoking Status:  current Packs/Day:  1.0  Review of Systems       All other systems reviewed and were negative   Physical Exam  General:  Well-developed,well-nourished,in no acute distress; alert,appropriate and cooperative throughout examination Head:  normocephalic and atraumatic.   Eyes:  pupils equal and pupils round.   Ears:  R ear normal and L ear normal.   Neck:  No deformities, masses, or tenderness  noted. Chest Wall:  No deformities, masses, or tenderness noted. Lungs:  Normal respiratory effort, chest expands symmetrically. Lungs are clear to auscultation, no crackles or wheezes. Heart:  Normal rate and regular rhythm. S1 and S2 normal without gallop, murmur, click, rub or other extra sounds. Abdomen:  Bowel sounds positive,abdomen soft and non-tender without masses,  organomegaly or hernias noted. overweight Msk:  No deformity or scoliosis noted of thoracic or lumbar spine.   Extremities:  No clubbing, cyanosis, edema, or deformity noted  Neurologic:  cranial nerves II-XII intact and gait normal.   Skin:  turgor normal and color normal.   Psych:  memory intact for recent and remote and good eye contact.     Impression & Recommendations:  Problem # 1:  HYPERTENSION (ICD-401.9)  probably overtreated change to lisinopril Her updated medication list for this problem includes:    Lisinopril 20 Mg Tabs (Lisinopril) .Marland Kitchen... 1 tablet by mouth daily  Orders: Venipuncture (04540) TLB-BMP (Basic Metabolic Panel-BMET) (80048-METABOL)  Problem # 2:  HYPERLIPIDEMIA (ICD-272.4) needs f/u   Her updated medication list for this problem includes:    Lipitor 80 Mg Tabs (Atorvastatin calcium) .Marland Kitchen... Take 1 tab by mouth daily or as direction  Problem # 3:  SYNDROME, CHRONIC PAIN (ICD-338.4) followed by pain clinic  Problem # 4:  TOBACCO USE (ICD-305.1)  Encouraged smoking cessation and discussed different methods for smoking cessation.   Problem # 5:  GERD (ICD-530.81) well controlled on meds continue current medications  Her updated medication list for this problem includes:    Protonix 40 Mg Tbec (Pantoprazole sodium) ..... Once daily She worries about memory loss---she says she forgets simple things: missing keys, forgetting to take meds to physicians (she does not have trouble with financial matters, school or driving) I am not concerned at this time  Complete Medication List: 1)  Lyrica 75 Mg Caps (Pregabalin) .... One by mouth qid 2)  Estradiol 2 Mg Tabs (Estradiol) .... Take 1 tablet by mouth once a day 3)  Zovirax 5 % Crea (Acyclovir) .... Apply qid for 5 days. as needed 4)  Oxycontin 40 Mg Tb12 (Oxycodone hcl) .... Take 1 tablet by mouth three times a day for chronic pain 5)  Oxycodone-acetaminophen 5-325 Mg Tabs (Oxycodone-acetaminophen) ....  One every 6-8 hours as needed breakthrough pain 6)  Provigil 200 Mg Tabs (Modafinil) .... One and a half every morning 7)  Lisinopril 20 Mg Tabs (Lisinopril) .Marland Kitchen.. 1 tablet by mouth daily 8)  Lipitor 80 Mg Tabs (Atorvastatin calcium) .... Take 1 tab by mouth daily or as direction 9)  Protonix 40 Mg Tbec (Pantoprazole sodium) .... Once daily  Other Orders: TLB-Lipid Panel (80061-LIPID) TLB-Hepatic/Liver Function Pnl (80076-HEPATIC)  Patient Instructions: 1)  Please schedule a follow-up appointment in 6 months. Prescriptions: LISINOPRIL 20 MG TABS (LISINOPRIL) 1 tablet by mouth daily  #90 x 3   Entered and Authorized by:   Birdie Sons MD   Signed by:   Birdie Sons MD on 05/07/2009   Method used:   Print then Give to Patient   RxID:   [id number]    Immunization History:  Influenza Immunization History:    Influenza:  historical (04/02/2009)

## 2010-07-16 NOTE — Progress Notes (Signed)
Summary: report on meds  Medications Added LIPITOR 40 MG  TABS (ATORVASTATIN CALCIUM) 1/2 every other day       Phone Note Call from Patient Call back at [phone]   Call For: SWORDS Summary of Call: 1) Vivelle patch not working for hot flashes, continuing it until she hears.  2) Simvastatin 40 mg making her sore, joints especially left arm & hip.  Off this until she hears back.  [facility].  Allergic to Sulfa & Codeine. Initial call taken by: Rudy Jew, RN,  August 21, 2007 8:53 AM  Follow-up for Phone Call        give vivelle one more week stop simvastatin  Lipitor 40 mg 1/2 by mouth every other day #30/3 Follow-up by: Birdie Sons MD,  August 21, 2007 10:42 AM  Additional Follow-up for Phone Call Additional follow up Details #1::        Patient aware.  Med sent to [facility]. Additional Follow-up by: Rudy Jew, RN,  August 21, 2007 11:19 AM   New Allergies: ! * SIMVASTATIN New/Updated Medications: LIPITOR 40 MG  TABS (ATORVASTATIN CALCIUM) 1/2 every other day New Allergies: ! * SIMVASTATIN  Prescriptions: LIPITOR 40 MG  TABS (ATORVASTATIN CALCIUM) 1/2 every other day  #30 x 3   Entered by:   Rudy Jew, RN   Authorized by:   Birdie Sons MD   Signed by:   Rudy Jew, RN on 08/21/2007   Method used:   Telephoned to ...       [facility]adene Pierini / [facility]       [address]       [place]       [place], [place]  [address]       Ph: [phone]       Fax: [phone]   RxID:   [id number]

## 2010-07-16 NOTE — Letter (Signed)
Summary: Previsit letter  [facility]  [address] [place], [place] [address]   Phone: [phone]  Fax: [phone]       11/05/2009 MRN: [id number]  [facility] [address] [place], [place]  [address]  Dear [employer],  Welcome to the Gastroenterology Division at [facility].    You are scheduled to see a nurse for your pre-procedure visit on 11-19-09 at 11:00a.m. on the 3rd floor at [facility], [address]  We ask that you try to arrive at our office 15 minutes prior to your appointment time to allow for check-in.  Your nurse visit will consist of discussing your medical and surgical history, your immediate family medical history, and your medications.    Please bring a complete list of all your medications or, if you prefer, bring the medication bottles and we will list them.  We will need to be aware of both prescribed and over the counter drugs.  We will need to know exact dosage information as well.  If you are on blood thinners (Coumadin, Plavix, Aggrenox, Ticlid, etc.) please call our office today/prior to your appointment, as we need to consult with your physician about holding your medication.   Please be prepared to read and sign documents such as consent forms, a financial agreement, and acknowledgement forms.  If necessary, and with your consent, a friend or relative is welcome to sit-in on the nurse visit with you.  Please bring your insurance card so that we may make a copy of it.  If your insurance requires a referral to see a specialist, please bring your referral form from your primary care physician.  No co-pay is required for this nurse visit.     If you cannot keep your appointment, please call [phone] to cancel or reschedule prior to your appointment date.  This allows [place] the opportunity to schedule an appointment for another patient in need of care.    Thank you for choosing [facility] for your medical  needs.  We appreciate the opportunity to care for you.  Please visit [place] at our website  to learn more about our practice.                     Sincerely.                                                                                                                   The Gastroenterology Division

## 2010-07-16 NOTE — Progress Notes (Signed)
Summary: prior auth pantoprazole good to 02/13/11  Phone Note Call from Patient   Caller: Patient Call For: Birdie Sons MD Summary of Call: needs refill pantoprazole.  Has tried OTC prilosec without relief. Initial call taken by: Gladis Riffle, RN,  March 25, 2009 2:52 PM  Follow-up for Phone Call        will need prior authorization. Follow-up by: Gladis Riffle, RN,  March 25, 2009 2:54 PM  Additional Follow-up for Phone Call Additional follow up Details #1::        called [facility] and they state she has prior authorization good until 02/13/11 and no Rx needed at present. Additional Follow-up by: Gladis Riffle, RN,  March 25, 2009 2:59 PM

## 2010-07-16 NOTE — Progress Notes (Signed)
Summary: UP DOSAGE ON MEDS?  Medications Added PREMARIN 0.625 MG  TABS (ESTROGENS CONJUGATED) take one by mouth daily       Phone Note Call from Patient Call back at [phone]   Caller: Patient Call For: SWORDS Summary of Call: WAS IN ON FRIDAY AND HAS TAKEN THE MEDS AND DOESN'T FEEL BETTER AFTER 4 DAYS  DR SWORDS SAID MIGHT NEED TO UP THE DOSE Initial call taken by: Roselle Locus,  March 29, 2007 1:16 PM  Follow-up for Phone Call        03/24/07 OV shows Premarin 0.3 tabs [facility]  [phone] ..................................................................Marland KitchenSid Falcon LPN  March 29, 2007 1:41 PM   Additional Follow-up for Phone Call Additional follow up Details #1::        take premarin 0.625 mg    Additional Follow-up by: Birdie Sons MD,  March 29, 2007 4:15 PM    Additional Follow-up for Phone Call Additional follow up Details #2::    New Rx sent electronically to her pharmacy, Pt informed Follow-up by: Sid Falcon LPN,  March 29, 2007 4:28 PM  New/Updated Medications: PREMARIN 0.625 MG  TABS (ESTROGENS CONJUGATED) take one by mouth daily   Prescriptions: PREMARIN 0.625 MG  TABS (ESTROGENS CONJUGATED) take one by mouth daily  #30 x 6   Entered by:   Sid Falcon LPN   Authorized by:   Birdie Sons MD   Signed by:   Sid Falcon LPN on 19/14/7829   Method used:   Electronically sent to ...       [facility]  [phone]*       [address]       [place], [place]  [address]       Ph: [phone] or [phone]       Fax: [phone]   RxID:   [id number]

## 2010-07-16 NOTE — Miscellaneous (Signed)
Summary: [facility] Previsit/prep  Clinical Lists Changes  Observations: Added new observation of ALLERGY REV: Done (11/19/2009 10:38)

## 2010-07-16 NOTE — Assessment & Plan Note (Signed)
Summary: left arm pain/mhf   Vital Signs:  Patient Profile:   50 Years Old Female Height:     64 inches Weight:      182 pounds Temp:     98.3 degrees F Pulse rate:   74 / minute BP sitting:   100 / 70  (left arm)  Vitals Entered By: Gladis Riffle, RN (October 02, 2007 3:19 PM)                 PCP:  Birdie Sons MD  Chief Complaint:  c/o left arm pain and states was in hospital overnight and told to keep this appt on discharge.  History of Present Illness: chest pain---see labs and d/c summary from hospital.  arm pain---ongoing for months---left arm. no weakness. no trauma. increase pain with abduction and forward flexion.   tobacco abuse---continues  Past Medical History: Cholelithiasis GERD Hyperlipidemia  Past Surgical History: Cholecystectomy Hysterectomy  Social History: Former Smoker Alcohol use-no Drug use-no  Family History: Family History Diabetes 1st degree relative Family History Hypertension Fam hx CAD     Current Allergies (reviewed today): ! * SIMVASTATIN CODEINE PHOSPHATE (CODEINE PHOSPHATE) SULFAMETHOXAZOLE (SULFAMETHOXAZOLE)        Impression & Recommendations:  Problem # 1:  CHEST PAIN (ICD-786.50) resolved Orders: Cardiology Referral (Cardiology)   Problem # 2:  HYPERLIPIDEMIA (ICD-272.4)  Her updated medication list for this problem includes:    Lipitor 40 Mg Tabs (Atorvastatin calcium) .Marland Kitchen... 1/2 every other day   Problem # 3:  TOBACCO USE (ICD-305.1) Encouraged smoking cessation and discussed different methods for smoking cessation.   Problem # 4:  GERD (ICD-530.81)  Diagnostics Reviewed:  Discussed lifestyle modifications, diet, antacids/medications, and preventive measures. Handout provided.   Problem # 5:  ARM PAIN (ICD-729.5) unclear etiology. Will check shoulder x-ray. She could have a radiculopathy. Total time with patient 25 minutes. Greater than half face-to-face with patient Orders: T-Shoulder Left Min 2  Views (73030TC)   Complete Medication List: 1)  Lyrica 75 Mg Caps (Pregabalin) .... One by mouth tid 2)  Vivelle-dot 0.1 Mg/24hr Pttw (Estradiol) .... Change twice weekly 3)  Zovirax 5 % Crea (Acyclovir) .... Apply qid for 5 days. as needed 4)  Oxycontin 40 Mg Tb12 (Oxycodone hcl) .... Take 1 tablet by mouth three times a day for chronic pain 5)  Oxycodone-acetaminophen 5-325 Mg Tabs (Oxycodone-acetaminophen) .... One every 6-8 hours as needed breakthrough pain 6)  Provigil 200 Mg Tabs (Modafinil) .... One every morning 7)  Lipitor 40 Mg Tabs (Atorvastatin calcium) .... 1/2 every other day 8)  Diclofenac Sodium 75 Mg Tbec (Diclofenac sodium) .... Take 1 tablet by mouth two times a day with food     Prescriptions: DICLOFENAC SODIUM 75 MG TBEC (DICLOFENAC SODIUM) Take 1 tablet by mouth two times a day with food  #30 x 0   Entered and Authorized by:   Birdie Sons MD   Signed by:   Birdie Sons MD on 10/02/2007   Method used:   Electronically sent to ...       [facility]*       [phone]       [place], [place]  [address]       Ph: [phone]       Fax: [phone]   RxID:   [id number]  ]

## 2010-07-16 NOTE — Assessment & Plan Note (Signed)
Summary: consult re: dissability issues/cjr   Vital Signs:  Patient profile:   50 year old female Weight:      210 pounds BMI:     36.18 Pulse rate:   72 / minute Pulse rhythm:   regular Resp:     12 per minute BP sitting:   114 / 78  (left arm) Cuff size:   regular  Vitals Entered By: Gladis Riffle, RN (Nov 04, 2009 8:20 AM)  Primary Care Provider:  Birdie Sons MD   History of Present Illness: Interested in disability. She has appt with SS in June She has been seeing [facility]. Pt states that she has leg pain with all acitivity including rest. She has MRI report---I have reviewed in our record. Leg pain is bilateral but L>R. she states legs "go numb" and hurt constantly---pain can be intense.   All other systems reviewed and were negative   Current Medications (verified): 1)  Lyrica 100 Mg Caps (Pregabalin) .... One Two Times A Day Plus Two At Bedtime 2)  Estradiol 2 Mg Tabs (Estradiol) .... Take 1 Tablet By Mouth Once A Day 3)  Zovirax 5 %  Crea (Acyclovir) .... Apply Qid For 5 Days. As Needed 4)  Oxycontin 40 Mg  Tb12 (Oxycodone Hcl) .... Take 1 Tablet By Mouth Three Times A Day For Chronic Pain 5)  Oxycodone-Acetaminophen 10-325 Mg Tabs (Oxycodone-Acetaminophen) .... One Every 6-8 Hours For Breakthrough Pain 6)  Provigil 200 Mg  Tabs (Modafinil) .... One and A Half Every Morning 7)  Lisinopril 20 Mg Tabs (Lisinopril) .Marland Kitchen.. 1 Tablet By Mouth Daily 8)  Lipitor 80 Mg Tabs (Atorvastatin Calcium) .... Take 1 Tab By Mouth Daily or As Direction 9)  Prilosec Otc 20 Mg Tbec (Omeprazole Magnesium) .... Once Daily 10)  Voltaren 1 % Gel (Diclofenac Sodium) .... As Needed 11)  Lidoderm 5 % Ptch (Lidocaine) .... As Needed  Allergies: 1)  ! * Simvastatin 2)  Codeine Phosphate (Codeine Phosphate) 3)  Sulfamethoxazole (Sulfamethoxazole)  Social History: Current Smoker--1ppd Alcohol use-no Drug use-no occupation: [employer]---currently not  working  Physical Exam  General:  alert and well-developed.   Head:  normocephalic and atraumatic.   Eyes:  pupils equal and pupils round.   Ears:  R ear normal and L ear normal.   Neck:  No deformities, masses, or tenderness noted. Chest Wall:  no deformities and no tenderness.   Lungs:  Normal respiratory effort, chest expands symmetrically. Lungs are clear to auscultation, no crackles or wheezes. Heart:  normal rate and regular rhythm.   Abdomen:  soft and non-tender.   Msk:  No deformity or scoliosis noted of thoracic or lumbar spine.   Pulses:  R radial normal and L radial normal.   Neurologic:  cranial nerves II-XII intact and gait normal.     Impression & Recommendations:  Problem # 1:  BACK PAIN (ICD-724.5) and leg pain---severe at times she is  undergoing the eval for disability--eval later next month Her updated medication list for this problem includes: continue current medications     Oxycontin 40 Mg Tb12 (Oxycodone hcl) .Marland Kitchen... Take 1 tablet by mouth three times a day for chronic pain    Oxycodone-acetaminophen 10-325 Mg Tabs (Oxycodone-acetaminophen) ..... One every 6-8 hours for breakthrough pain  Problem # 2:  PREVENTIVE HEALTH CARE (ICD-V70.0) tetatnus today GI referral  total time (all FTF) 30 minutes Orders: Gastroenterology Referral (GI)  Complete Medication List: 1)  Lyrica 100 Mg Caps (Pregabalin) .Marland KitchenMarland KitchenMarland Kitchen  One two times a day plus two at bedtime 2)  Estradiol 2 Mg Tabs (Estradiol) .... Take 1 tablet by mouth once a day 3)  Zovirax 5 % Crea (Acyclovir) .... Apply qid for 5 days. as needed 4)  Oxycontin 40 Mg Tb12 (Oxycodone hcl) .... Take 1 tablet by mouth three times a day for chronic pain 5)  Oxycodone-acetaminophen 10-325 Mg Tabs (Oxycodone-acetaminophen) .... One every 6-8 hours for breakthrough pain 6)  Provigil 200 Mg Tabs (Modafinil) .... One and a half every morning 7)  Lisinopril 20 Mg Tabs (Lisinopril) .Marland Kitchen.. 1 tablet by mouth daily 8)  Lipitor 80 Mg  Tabs (Atorvastatin calcium) .... Take 1 tab by mouth daily or as direction 9)  Prilosec Otc 20 Mg Tbec (Omeprazole magnesium) .... Once daily 10)  Voltaren 1 % Gel (Diclofenac sodium) .... As needed 11)  Lidoderm 5 % Ptch (Lidocaine) .... As needed  Other Orders: Venipuncture ([address]) TLB-BMP (Basic Metabolic Panel-BMET) (80048-METABOL)   Preventive Care Screening  Mammogram:    Date:  10/12/2009    Next Due:  10/2011    Results:  normal-pt's report   Pap Smear:    Date:  10/12/2009    Next Due:  10/2012    Results:  normal-pts report    Appended Document: consult re: dissability issues/cjr   Immunizations Administered:  Tetanus Vaccine:    Vaccine Type: Tdap    Site: right deltoid    Mfr: GlaxoSmithKline    Dose: 0.5 ml    Route: IM    Given by: Gladis Riffle, RN    Exp. Date: 09/06/2011    Lot #: [id number]

## 2010-07-16 NOTE — Medication Information (Signed)
Summary: Approval for Pantoprazole Sodium/[facility]  Approval for Pantoprazole Sodium/[facility]   Imported By: Maryln Gottron 03/25/2009 10:22:41  _____________________________________________________________________  External Attachment:    Type:   Image     Comment:   External Document

## 2010-07-16 NOTE — Assessment & Plan Note (Signed)
Summary: rash on hip, back of legs and back/cjr   Vital Signs:  Patient profile:   50 year old female Height:      64 inches (162.56 cm) Weight:      194 pounds (88.18 kg) O2 Sat:      94 % on Room air Temp:     98.4 degrees F (36.89 degrees C) oral Pulse rate:   96 / minute BP sitting:   124 / 82  (left arm) Cuff size:   regular  Vitals Entered By: Josph Macho RMA (February 05, 2010 11:18 AM)  O2 Flow:  Room air CC: Rash on hips, back of legs, and back x2 days, little itch, with burning/ CF Is Patient Diabetic? No   History of Present Illness: Patient in today for the sudden onset of hives 2 days ago. She had just started using Dial soap and developed a large urticarial lesion on her lateral right hip Tuesday night. Since then she has developed other lesions. A small one is noted on her left hip now. She has urticarial lesions on her back and under her arms as well. She has stopped using the soap but the lesions persist. No f/c/malaise/other new products. No CP/palp/SOB. She has increased her smoking lately and is up over a 1ppd.  Current Medications (verified): 1)  Lyrica 100 Mg Caps (Pregabalin) .... One Two Times A Day Plus Two At Bedtime 2)  Estradiol 2 Mg Tabs (Estradiol) .... Take 1 Tablet By Mouth Once A Day 3)  Zovirax 5 %  Crea (Acyclovir) .... Apply Qid For 5 Days. As Needed 4)  Oxycontin 40 Mg  Tb12 (Oxycodone Hcl) .... Take 1 Tablet By Mouth Three Times A Day For Chronic Pain 5)  Oxycodone-Acetaminophen 10-325 Mg Tabs (Oxycodone-Acetaminophen) .... One Every 6-8 Hours For Breakthrough Pain 6)  Provigil 200 Mg  Tabs (Modafinil) .... One and A Half Every Morning 7)  Lisinopril 20 Mg Tabs (Lisinopril) .Marland Kitchen.. 1 Tablet By Mouth Daily 8)  Lipitor 80 Mg Tabs (Atorvastatin Calcium) .... Take 1 Tab By Mouth Daily or As Direction 9)  Prilosec Otc 20 Mg Tbec (Omeprazole Magnesium) .... Once Daily 10)  Voltaren 1 % Gel (Diclofenac Sodium) .... As Needed 11)  Lidoderm 5 % Ptch  (Lidocaine) .... As Needed  Allergies (verified): 1)  ! * Simvastatin 2)  Codeine Phosphate (Codeine Phosphate) 3)  Sulfamethoxazole (Sulfamethoxazole)  Past History:  Past medical history reviewed for relevance to current acute and chronic problems. Social history (including risk factors) reviewed for relevance to current acute and chronic problems.  Past Medical History: Reviewed history from 11/29/2008 and no changes required. Cholelithiasis GERD Hyperlipidemia Hypertension  Social History: Reviewed history from 11/04/2009 and no changes required. Current Smoker--1ppd Alcohol use-no Drug use-no occupation: [employer]---currently not working  Review of Systems      See HPI  Physical Exam  General:  Well-developed,well-nourished,in no acute distress; alert,appropriate and cooperative throughout examination Mouth:  Oral mucosa and oropharynx without lesions or exudates.  Teeth in good repair. Neck:  No deformities, masses, or tenderness noted. Lungs:  Normal respiratory effort, chest expands symmetrically. Lungs are clear to auscultation, no crackles or wheezes. Heart:  Normal rate and regular rhythm. S1 and S2 normal without gallop, murmur, click, rub or other extra sounds. Abdomen:  Bowel sounds positive,abdomen soft and non-tender without masses, organomegaly or hernias noted. Extremities:  No clubbing, cyanosis, edema, or deformity noted  Skin:  Large raised urticarial lesion noted over lateral right hip,  roughly 5 cm in diameter, several patches on back and under b/l axillae Cervical Nodes:  No lymphadenopathy noted Psych:  Cognition and judgment appear intact. Alert and cooperative with normal attention span and concentration. No apparent delusions, illusions, hallucinations   Impression & Recommendations:  Problem # 1:  ALLERGIC URTICARIA (ICD-708.0)  Given DepoMedrol in office and started on Prednisone and Zyrtec Avoid Dial soap and any other  possible culprits, May use Witch Hazel Astringent to cleanse any urticarial lesions  Orders: Admin of Therapeutic Inj  intramuscular or subcutaneous (56213) Depo-Medrol 20mg  (J1020)  Problem # 2:  HYPERTENSION (ICD-401.9)  Her updated medication list for this problem includes:    Lisinopril 20 Mg Tabs (Lisinopril) .Marland Kitchen... 1 tablet by mouth daily Adequate control on current meds. No changes  Problem # 3:  TOBACCO USE (ICD-305.1)  Counselled regarding the need for decreased use and cessation, warned regarding the likelihood of numerous cancers, heart and lung disease, etc. She is encouraged to try Nicorette 4 mg lozenges as needed and attempt to wait at least 10 minutes when she has a craving. She expresses understanding. Counselled for greater than 3 minutes  Orders: Tobacco use cessation intermediate 3-10 minutes (99406)  Complete Medication List: 1)  Lyrica 100 Mg Caps (Pregabalin) .... One two times a day plus two at bedtime 2)  Estradiol 2 Mg Tabs (Estradiol) .... Take 1 tablet by mouth once a day 3)  Zovirax 5 % Crea (Acyclovir) .... Apply qid for 5 days. as needed 4)  Oxycontin 40 Mg Tb12 (Oxycodone hcl) .... Take 1 tablet by mouth three times a day for chronic pain 5)  Oxycodone-acetaminophen 10-325 Mg Tabs (Oxycodone-acetaminophen) .... One every 6-8 hours for breakthrough pain 6)  Provigil 200 Mg Tabs (Modafinil) .... One and a half every morning 7)  Lisinopril 20 Mg Tabs (Lisinopril) .Marland Kitchen.. 1 tablet by mouth daily 8)  Lipitor 80 Mg Tabs (Atorvastatin calcium) .... Take 1 tab by mouth daily or as direction 9)  Prilosec Otc 20 Mg Tbec (Omeprazole magnesium) .... Once daily 10)  Voltaren 1 % Gel (Diclofenac sodium) .... As needed 11)  Lidoderm 5 % Ptch (Lidocaine) .... As needed 12)  Prednisone 20 Mg Tabs (Prednisone) .... 2 tabs by mouth once daily x 5days 13)  Cetirizine Hcl 10 Mg Tabs (Cetirizine hcl) .Marland Kitchen.. 1 tab by mouth two times a day as needed allergies/urticaria  Other  Orders: Admin 1st Vaccine (08657) Flu Vaccine 58yrs + (84696)   Flu Vaccine Consent Questions     Do you have a history of severe allergic reactions to this vaccine? no    Any prior history of allergic reactions to egg and/or gelatin? no    Do you have a sensitivity to the preservative Thimersol? no    Do you have a past history of Guillan-Barre Syndrome? no    Do you currently have an acute febrile illness? no    Have you ever had a severe reaction to latex? no    Vaccine information given and explained to patient? yes    Are you currently pregnant? no    Lot Number:[id number]   Exp Date:12/12/2010   Site Given  Left Deltoid IM Other Orders: Admin 1st Vaccine (29528) Flu Vaccine 10yrs + (41324)  Patient Instructions: 1)  Please schedule a follow-up appointment as needed  if symptoms worsen or do not improve.  2)  May use Benadryl at bedtime if itching and insomnia is a problem. 3)  Avoid Dial soap and any offending  products Prescriptions: CETIRIZINE HCL 10 MG TABS (CETIRIZINE HCL) 1 tab by mouth two times a day as needed allergies/urticaria  #60 x 2   Entered and Authorized by:   Danise Edge MD   Signed by:   Danise Edge MD on 02/05/2010   Method used:   [facility] to        [facility] [address] [phone]* (retail)       [address]       [place], [place]  [address]       Ph: [phone] or [phone]       Fax: [phone]   RxID:   [id number] PREDNISONE 20 MG TABS (PREDNISONE) 2 tabs by mouth once daily x 5days  #10 x 0   Entered and Authorized by:   Danise Edge MD   Signed by:   Danise Edge MD on 02/05/2010   Method used:   [facility]        [facility] [address] [phone]* (retail)       [address]       [place], [place]  [address]       Ph: [phone] or [phone]       Fax: [phone]   RxID:   [id number]   .lbflu   Medication Administration  Injection # 1:    Medication:  Depo-Medrol 20mg     Diagnosis: ALLERGIC URTICARIA (ICD-708.0)    Route: IM    Site: RUOQ gluteus    Exp Date: 4/12    Lot #: [id number]    Mfr: Pharmacia    Comments: 20 mg given    Patient tolerated injection without complications    Given by: Josph Macho RMA (February 05, 2010 12:21 PM)  Orders Added: 1)  Admin 1st Vaccine [90471] 2)  Flu Vaccine 10yrs + [31517] 3)  Tobacco use cessation intermediate 3-10 minutes [99406] 4)  Admin of Therapeutic Inj  intramuscular or subcutaneous [96372] 5)  Depo-Medrol 20mg  [J1020] 6)  Est. Patient Level IV [61607]

## 2010-07-16 NOTE — Assessment & Plan Note (Signed)
Summary: 6 week roa//LH   Vital Signs:  Patient profile:   50 year old female Height:      64 inches Weight:      196 pounds BMI:     33.76 Temp:     98.2 degrees F oral Pulse rate:   80 / minute Resp:     14 per minute BP sitting:   146 / 92  (left arm)  Vitals Entered By: Willy Eddy, LPN (January 10, 2009 10:34 AM)  Primary Care Provider:  Birdie Sons MD  CC:  roa after starting lipitor 40 1/2 and lisinopril-hctz 20-12.5 last ov--to er for pylenephritis - rx with cipro 500 bid for 14 days and has completd pyridium .  History of Present Illness:  Follow-Up Visit      This is a 50 year old woman who presents for Follow-up visit.  The patient denies chest pain, palpitations, dizziness, syncope, low blood sugar symptoms, high blood sugar symptoms, edema, SOB, DOE, PND, and orthopnea.  Since the last visit the patient notes no new problems or concerns.  The patient reports taking meds as prescribed and not monitoring BP.  When questioned about possible medication side effects, the patient notes none.    All other systems reviewed and were negative   Current Problems (verified): 1)  Hypertension  (ICD-401.9) 2)  Tobacco Use  (ICD-305.1) 3)  Hyperlipidemia  (ICD-272.4) 4)  Syndrome, Chronic Pain  (ICD-338.4) 5)  Menopausal Disorder  (ICD-627.9) 6)  Family History Diabetes 1st Degree Relative  (ICD-V18.0) 7)  Gerd  (ICD-530.81) 8)  Cholelithiasis  (ICD-574.20)  Current Medications (verified): 1)  Lyrica 75 Mg  Caps (Pregabalin) .... One By Mouth Qid 2)  Estradiol 2 Mg Tabs (Estradiol) .... Take 1 Tablet By Mouth Once A Day 3)  Zovirax 5 %  Crea (Acyclovir) .... Apply Qid For 5 Days. As Needed 4)  Oxycontin 40 Mg  Tb12 (Oxycodone Hcl) .... Take 1 Tablet By Mouth Three Times A Day For Chronic Pain 5)  Oxycodone-Acetaminophen 5-325 Mg  Tabs (Oxycodone-Acetaminophen) .... One Every 6-8 Hours As Needed Breakthrough Pain 6)  Provigil 200 Mg  Tabs (Modafinil) .... One and A Half  Every Morning 7)  Lisinopril-Hydrochlorothiazide 20-12.5 Mg Tabs (Lisinopril-Hydrochlorothiazide) .Marland Kitchen.. 1 Tablet By Mouth Daily 8)  Lipitor 40 Mg Tabs (Atorvastatin Calcium) .... Take 1/2 Every Other Day  Allergies (verified): 1)  ! * Simvastatin 2)  Codeine Phosphate (Codeine Phosphate) 3)  Sulfamethoxazole (Sulfamethoxazole)  Past History:  Past Medical History: Last updated: 11/29/2008 Cholelithiasis GERD Hyperlipidemia Hypertension  Past Surgical History: Last updated: 11/29/2008 Cholecystectomy Hysterectomy ankle fracture  Family History: Last updated: 01/04/2007 Family History Diabetes 1st degree relative Family History Hypertension Fam hx CAD  Social History: Last updated: 01/04/2007 Former Smoker Alcohol use-no Drug use-no  Risk Factors: Smoking Status: quit (01/04/2007)  Review of Systems       All other systems reviewed and were negative   Physical Exam  General:  Well-developed,well-nourished,in no acute distress; alert,appropriate and cooperative throughout examination Head:  normocephalic and atraumatic.   Eyes:  pupils equal and pupils round.   Ears:  R ear normal and L ear normal.   Neck:  No deformities, masses, or tenderness noted. Chest Wall:  No deformities, masses, or tenderness noted. Lungs:  Normal respiratory effort, chest expands symmetrically. Lungs are clear to auscultation, no crackles or wheezes. Heart:  Normal rate and regular rhythm. S1 and S2 normal without gallop, murmur, click, rub or other extra sounds. Abdomen:  Bowel sounds positive,abdomen soft and non-tender without masses, organomegaly or hernias noted. Msk:  No deformity or scoliosis noted of thoracic or lumbar spine.   ankle brace left ankle Pulses:  R radial normal and L radial normal.   Neurologic:  alert & oriented X3 and cranial nerves II-XII intact.     Impression & Recommendations:  Problem # 1:  HYPERTENSION (ICD-401.9) Assessment Improved some  improved says she is cutting pill in 1/2  Her updated medication list for this problem includes:    Lisinopril-hydrochlorothiazide 20-12.5 Mg Tabs (Lisinopril-hydrochlorothiazide) .Marland Kitchen... 1 tablet by mouth daily  BP today: 146/92 Prior BP: 178/98 (11/29/2008)  Labs Reviewed: K+: 4.4 (11/29/2008) Creat: : 0.7 (11/29/2008)   Chol: 227 (11/29/2008)   HDL: 33.90 (11/29/2008)   LDL: DEL (05/18/2007)   TG: 249.0 (11/29/2008)  Problem # 2:  TOBACCO USE (ICD-305.1)  she understands need to quit not interested at this time  Orders: Tobacco use cessation intermediate 3-10 minutes (84132)  Problem # 3:  HYPERLIPIDEMIA (ICD-272.4) will change dose 1/2 of 80 mg  Her updated medication list for this problem includes:    Lipitor 80 Mg Tabs (Atorvastatin calcium) .Marland Kitchen... Take 1 tab by mouth daily or as direction  Labs Reviewed: SGOT: 16 (08/03/2006)   SGPT: 17 (08/03/2006)   HDL:33.90 (11/29/2008), 42.0 (05/18/2007)  LDL:DEL (05/18/2007), DEL (08/03/2006)  Chol:227 (11/29/2008), 252 (05/18/2007)  Trig:249.0 (11/29/2008), 150 (05/18/2007)  Problem # 4:  GERD (ICD-530.81)  she says previous use of prilosec was not helpful trial protonix  Her updated medication list for this problem includes:    Protonix 40 Mg Tbec (Pantoprazole sodium) ..... Once daily  Complete Medication List: 1)  Lyrica 75 Mg Caps (Pregabalin) .... One by mouth qid 2)  Estradiol 2 Mg Tabs (Estradiol) .... Take 1 tablet by mouth once a day 3)  Zovirax 5 % Crea (Acyclovir) .... Apply qid for 5 days. as needed 4)  Oxycontin 40 Mg Tb12 (Oxycodone hcl) .... Take 1 tablet by mouth three times a day for chronic pain 5)  Oxycodone-acetaminophen 5-325 Mg Tabs (Oxycodone-acetaminophen) .... One every 6-8 hours as needed breakthrough pain 6)  Provigil 200 Mg Tabs (Modafinil) .... One and a half every morning 7)  Lisinopril-hydrochlorothiazide 20-12.5 Mg Tabs (Lisinopril-hydrochlorothiazide) .Marland Kitchen.. 1 tablet by mouth daily 8)  Lipitor 80  Mg Tabs (Atorvastatin calcium) .... Take 1 tab by mouth daily or as direction 9)  Protonix 40 Mg Tbec (Pantoprazole sodium) .... Once daily  Patient Instructions: 1)  Please schedule a follow-up appointment in 3 months. 2)  Schedule urine culture for the week of August 16 Prescriptions: PROTONIX 40 MG  TBEC (PANTOPRAZOLE SODIUM) once daily  #30 x 3   Entered and Authorized by:   Birdie Sons MD   Signed by:   Birdie Sons MD on 01/10/2009   Method used:   [facility] to        [facility]  [phone]* (retail)       [address] [place], [place]  [address]       Ph: [phone] or [phone]       Fax: [phone]   RxID:   [id number] LIPITOR 80 MG TABS (ATORVASTATIN CALCIUM) Take 1 tab by mouth daily or as direction  #30 x 11   Entered and Authorized by:   Birdie Sons MD   Signed by:   Birdie Sons MD on 01/10/2009   Method used:   [facility]ronically to  [facility]  [phone]* (retail)       [address] [place], [place]  [address]       Ph: [phone] or [phone]       Fax: [phone]   RxID:   [id number]

## 2010-07-16 NOTE — Assessment & Plan Note (Signed)
Summary: left shoulder pain/ccm   Vital Signs:  Patient profile:   50 year old female Weight:      196 pounds Temp:     98.6 degrees F oral BP sitting:   142 / 84  (left arm) Cuff size:   regular  Vitals Entered By: Alfred Levins, CMA (April 20, 2010 9:23 AM) CC: lt shoulder pain x3 wks   Primary Care Provider:  Birdie Sons MD  CC:  lt shoulder pain x3 wks.  History of Present Illness: neck pain radiates to bilateral shoulders duration 3 weeks. no acute injury. sxs progressively worsened over the past 3 weeks.  no weakness, or sensory deficits. Pain stays the same with acitivity or rest. pain described as moderate to severe  All other systems reviewed and were negative ---she does report being back to work for one month  Current Problems (verified): 1)  Preventive Health Care  (ICD-V70.0) 2)  Hypertension  (ICD-401.9) 3)  Tobacco Use  (ICD-305.1) 4)  Hyperlipidemia  (ICD-272.4) 5)  Syndrome, Chronic Pain  (ICD-338.4) 6)  Menopausal Disorder  (ICD-627.9) 7)  Gerd  (ICD-530.81) 8)  Cholelithiasis  (ICD-574.20)  Current Medications (verified): 1)  Lyrica 100 Mg Caps (Pregabalin) .... One Two Times A Day Plus Two At Bedtime 2)  Estradiol 2 Mg Tabs (Estradiol) .... Take 1 Tablet By Mouth Once A Day 3)  Zovirax 5 %  Crea (Acyclovir) .... Apply Qid For 5 Days. As Needed 4)  Oxycontin 40 Mg  Tb12 (Oxycodone Hcl) .... Take 1 Tablet By Mouth Three Times A Day For Chronic Pain 5)  Oxycodone-Acetaminophen 10-325 Mg Tabs (Oxycodone-Acetaminophen) .... One Every 6-8 Hours For Breakthrough Pain 6)  Provigil 200 Mg  Tabs (Modafinil) .... One and A Half Every Morning 7)  Lisinopril 20 Mg Tabs (Lisinopril) .Marland Kitchen.. 1 Tablet By Mouth Daily 8)  Lipitor 80 Mg Tabs (Atorvastatin Calcium) .... Take 1 Tab By Mouth Daily or As Direction  Allergies (verified): 1)  ! * Simvastatin 2)  Codeine Phosphate (Codeine Phosphate) 3)  Sulfamethoxazole (Sulfamethoxazole)  Past History:  Past  Medical History: Last updated: 11/29/2008 Cholelithiasis GERD Hyperlipidemia Hypertension  Past Surgical History: Last updated: 11/29/2008 Cholecystectomy Hysterectomy ankle fracture  Family History: Last updated: 01/04/2007 Family History Diabetes 1st degree relative Family History Hypertension Fam hx CAD  Social History: Last updated: 11/04/2009 Current Smoker--1ppd Alcohol use-no Drug use-no occupation: [employer]---currently not working  Risk Factors: Smoking Status: current (08/05/2009) Packs/Day: 1.0 (08/05/2009)  Physical Exam  General:  overweight female in no acute distress. HEENT exam atraumatic, normocephalic symmetric her muscles are intact. She has full range of motion of her neck although winces with discomfort with any movement. Also has tenderness to palpation over the cervical spine. She winces even to mild palpation although I'm able to palpate deeply without eliciting more discomfort. Full range of motion of both shoulders. Deep tendon reflexes in the upper extremity is are normal. Strength is normal.   Impression & Recommendations:  Problem # 1:  NECK PAIN (ICD-723.1)  I suspect this is just muscular pain. I don't think any further evaluation necessary at this time except I will check a sedimentation rate on the off chance that this the beginning ofpolymyalgia rheumatica. She might benefit the most from physical therapy. We will arrange that. Her updated medication list for this problem includes:    Oxycontin 40 Mg Tb12 (Oxycodone hcl) .Marland Kitchen... Take 1 tablet by mouth three times a day for chronic pain    Oxycodone-acetaminophen  10-325 Mg Tabs (Oxycodone-acetaminophen) ..... One every 6-8 hours for breakthrough pain  all pain medications for her pain management program. She does have a long history of chronic pain syndrome. It's very possible that her neck pain is just a extension of her chronic pain syndrome.  Orders: Sedimentation Rate,  non-automated (11914) Venipuncture (78295) Physical Therapy Referral (PT)  Complete Medication List: 1)  Lyrica 100 Mg Caps (Pregabalin) .... One two times a day plus two at bedtime 2)  Estradiol 2 Mg Tabs (Estradiol) .... Take 1 tablet by mouth once a day 3)  Zovirax 5 % Crea (Acyclovir) .... Apply qid for 5 days. as needed 4)  Oxycontin 40 Mg Tb12 (Oxycodone hcl) .... Take 1 tablet by mouth three times a day for chronic pain 5)  Oxycodone-acetaminophen 10-325 Mg Tabs (Oxycodone-acetaminophen) .... One every 6-8 hours for breakthrough pain 6)  Provigil 200 Mg Tabs (Modafinil) .... One and a half every morning 7)  Lisinopril 20 Mg Tabs (Lisinopril) .Marland Kitchen.. 1 tablet by mouth daily 8)  Lipitor 80 Mg Tabs (Atorvastatin calcium) .... Take 1 tab by mouth daily or as direction  Other Orders: TLB-Lipid Panel (80061-LIPID) TLB-Hepatic/Liver Function Pnl (80076-HEPATIC) TLB-BMP (Basic Metabolic Panel-BMET) (80048-METABOL)   Orders Added: 1)  Est. Patient Level IV [62130] 2)  Sedimentation Rate, non-automated [85651] 3)  Venipuncture [86578] 4)  Physical Therapy Referral [PT] 5)  TLB-Lipid Panel [80061-LIPID] 6)  TLB-Hepatic/Liver Function Pnl [80076-HEPATIC] 7)  TLB-BMP (Basic Metabolic Panel-BMET) [80048-METABOL]  Appended Document: Orders Update     Clinical Lists Changes  Orders: Added new Service order of Specimen Handling (46962) - Signed      Appended Document: Orders Update     Clinical Lists Changes  Observations: Added new observation of COMMENTS2: Wynona Canes, CMA  April 20, 2010 12:05 PM  (04/20/2010 12:04)      Laboratory Results   Blood Tests   Date/Time Recieved: April 20, 2010 12:05 PM  Date/Time Reported: April 20, 2010 12:05 PM   SED rate: 20  Comments: Wynona Canes, CMA  April 20, 2010 12:05 PM

## 2010-07-16 NOTE — Progress Notes (Signed)
Summary: lab results  Phone Note Call from Patient Call back at [phone]   Caller: Patient Call For: Birdie Sons MD Summary of Call: pt would like lab results Initial call taken by: Heron Sabins,  May 14, 2009 11:00 AM  Follow-up for Phone Call        Patient notified.   See append to labs Follow-up by: Gladis Riffle, RN,  May 14, 2009 2:56 PM

## 2010-07-16 NOTE — Assessment & Plan Note (Signed)
Summary: fup on labs/ccm  Medications Added VIVELLE-DOT 0.025 MG/24HR  PTTW (ESTRADIOL) change 2 x weekly ZOVIRAX 5 %  CREA (ACYCLOVIR) apply qid for 5 days. as needed OXYCONTIN 40 MG  TB12 (OXYCODONE HCL) Take 1 tablet by mouth three times a day for chronic pain OXYCODONE-ACETAMINOPHEN 5-325 MG  TABS (OXYCODONE-ACETAMINOPHEN) one every 6-8 hours as needed breakthrough pain PROVIGIL 200 MG  TABS (MODAFINIL) one every morning SIMVASTATIN 40 MG TABS (SIMVASTATIN) Take one tablet at bedtime        Vital Signs:  Patient Profile:   50 Years Old Female Height:     64 inches Pulse rate:   64 / minute BP sitting:   114 / 60  (left arm)  Vitals Entered By: Gladis Riffle, RN (August 11, 2007 10:37 AM)                 Chief Complaint:  review cholsesterol.  History of Present Illness:  HYPERLIPIDEMIA (ICD-272.4)-currently on no meds SYNDROME, CHRONIC PAIN (ICD-338.4)---sees pain clinic---dr PHILLIPS MENOPAUSAL DISORDER (ICD-627.9)-still with a lot of hot flashe---not much better on low dose premarin.  FAMILY HISTORY DIABETES 1ST DEGREE RELATIVE (ICD-V18.0) GERD (ICD-530.81) CHOLELITHIASIS (ICD-574.20)  Past Medical History: Cholelithiasis GERD Hyperlipidemia  Social History: Former Smoker Alcohol use-no Drug use-no  Family History: Family History Diabetes 1st degree relative Family History Hypertension Fam hx CAD Social History: Former Smoker Alcohol use-no Drug use-no        Current Allergies (reviewed today): CODEINE PHOSPHATE (CODEINE PHOSPHATE) SULFAMETHOXAZOLE (SULFAMETHOXAZOLE)     Review of Systems       no other complaints in a complete ROS      Impression & Recommendations:  Problem # 1:  HYPERLIPIDEMIA (ICD-272.4) 20 minute discusion regarding treatment.  she understands side effects Labs Reviewed: Chol: 252 (05/18/2007)   HDL: 42.0 (05/18/2007)   LDL: DEL (05/18/2007)   TG: 150 (05/18/2007) SGOT: 16 (08/03/2006)   SGPT: 17  (08/03/2006)  Her updated medication list for this problem includes:    Simvastatin 40 Mg Tabs (Simvastatin) .Marland Kitchen... Take one tablet at bedtime   Problem # 2:  GERD (ICD-530.81)  Problem # 3:  SYNDROME, CHRONIC PAIN (ICD-338.4)  Problem # 4:  MENOPAUSAL DISORDER (ICD-627.9) change to low dose vivelle Her updated medication list for this problem includes:    Vivelle-dot 0.025 Mg/24hr Pttw (Estradiol) .Marland Kitchen... Change 2 x weekly   Complete Medication List: 1)  Lyrica 75 Mg Caps (Pregabalin) .... One by mouth tid 2)  Vivelle-dot 0.025 Mg/24hr Pttw (Estradiol) .... Change 2 x weekly 3)  Zovirax 5 % Crea (Acyclovir) .... Apply qid for 5 days. as needed 4)  Oxycontin 40 Mg Tb12 (Oxycodone hcl) .... Take 1 tablet by mouth three times a day for chronic pain 5)  Oxycodone-acetaminophen 5-325 Mg Tabs (Oxycodone-acetaminophen) .... One every 6-8 hours as needed breakthrough pain 6)  Provigil 200 Mg Tabs (Modafinil) .... One every morning 7)  Simvastatin 40 Mg Tabs (Simvastatin) .... Take one tablet at bedtime   Patient Instructions: 1)  Please schedule a follow-up appointment in 2 months. 2)  labs one week prior to visit 3)  lipids---272.4 4)  lfts-995.2 5)  bmet-995.2 6)  A1C-790.6 7)       Prescriptions: VIVELLE-DOT 0.025 MG/24HR  PTTW (ESTRADIOL) change 2 x weekly  #10 x 6   Entered and Authorized by:   Birdie Sons MD   Signed by:   Birdie Sons MD on 08/11/2007   Method used:   Print then Give to Patient  RxID:   [id number] SIMVASTATIN 40 MG TABS (SIMVASTATIN) Take one tablet at bedtime  #90 x 3   Entered and Authorized by:   Birdie Sons MD   Signed by:   Birdie Sons MD on 08/11/2007   Method used:   Print then Give to Patient   RxID:   [id number]  ]

## 2010-07-16 NOTE — Progress Notes (Signed)
Summary: disability letter  Phone Note Call from Patient Call back at [phone]   Caller: Patient--VM Call For: Birdie Sons MD Summary of Call: asking progress of letter for social security.  pls advise. Initial call taken by: Gladis Riffle, RN,  November 12, 2009 2:33 PM     Appended Document: disability letter she should go to social security first.  we will be happy to provide her or them with medical records  Appended Document: disability letter Left message on machine. Pt to call back.   Appended Document: disability letter Patient notified. She is unable to pick up as is out of town until [employer] with SS is Mon at [employer].  She will call back with fax number for social security if ov notes and MRI results necessary.

## 2010-07-16 NOTE — Progress Notes (Signed)
Summary: rx needs to go to [facility] Note From Pharmacy   Caller: [facility] Call For: [employer] of Call: the patient does not use [facility] in [place] as she lives in [place] the [facility] in [place] is fax [phone] Initial call taken by: Roselle Locus,  Oct 23, 2007 3:51 PM  Follow-up for Phone Call        Rx done to [facility] [place] Follow-up by: Gladis Riffle, RN,  Oct 23, 2007 6:10 PM      Prescriptions: DICLOFENAC SODIUM 75 MG  TBEC (DICLOFENAC SODIUM) one tab two times a day  #60 x 0   Entered by:   Gladis Riffle, RN   Authorized by:   Birdie Sons MD   Signed by:   Gladis Riffle, RN on 10/23/2007   Method used:   Telephoned to ...       Jadene Pierini / [facility]       [address]       [place], [place]  [address]       Ph: [phone]       Fax: [phone]   RxID:   [id number]  telephoned to mindy at [facility]

## 2010-07-16 NOTE — Progress Notes (Signed)
Summary: Rx request refill  Phone Note Call from Patient Call back at Home Phone [phone]   Caller: Patient Call For: Birdie Sons MD Summary of Call: Call from pt requesting a refill on Diclofenac 75 mg, one tab two times a day.  Pt reports she had X-rays of her left arm/shoulder on 4/20 and she is still waiting to hear back from the [place] office for the scheduling of the MRI.  She finished the anti-inflamitory and the pain is returning. [facility] Initial call taken by: Sid Falcon LPN,  Oct 23, 2007 8:47 AM  Follow-up for Phone Call        ok to refill se append --- she refused MRI Follow-up by: Birdie Sons MD,  Oct 23, 2007 11:08 AM  Additional Follow-up for Phone Call Additional follow up Details #1::        Rx sent electronically, l;eft message on pt home pnone.    New/Updated Medications: DICLOFENAC SODIUM 75 MG  TBEC (DICLOFENAC SODIUM) one tab two times a day   Prescriptions: DICLOFENAC SODIUM 75 MG  TBEC (DICLOFENAC SODIUM) one tab two times a day  #60 x 0   Entered by:   Sid Falcon LPN   Authorized by:   Birdie Sons MD   Signed by:   Sid Falcon LPN on 52/84/1324   Method used:   Electronically sent to ...       [facility]*       [phone]       [place], [place]  [address]       Ph: [phone]       Fax: [phone]   RxID:   [id number]

## 2010-08-10 ENCOUNTER — Observation Stay (HOSPITAL_COMMUNITY)
Admission: EM | Admit: 2010-08-10 | Discharge: 2010-08-11 | Disposition: A | Discharge status: Home / Self Care | Payer: BC Managed Care – PPO | Attending: Cardiology | Admitting: Cardiology

## 2010-08-10 ENCOUNTER — Emergency Department (HOSPITAL_COMMUNITY): Payer: BC Managed Care – PPO

## 2010-08-10 DIAGNOSIS — I10 Essential (primary) hypertension: Secondary | ICD-10-CM | POA: Insufficient documentation

## 2010-08-10 DIAGNOSIS — K219 Gastro-esophageal reflux disease without esophagitis: Secondary | ICD-10-CM | POA: Insufficient documentation

## 2010-08-10 DIAGNOSIS — E669 Obesity, unspecified: Secondary | ICD-10-CM | POA: Insufficient documentation

## 2010-08-10 DIAGNOSIS — F17200 Nicotine dependence, unspecified, uncomplicated: Secondary | ICD-10-CM | POA: Insufficient documentation

## 2010-08-10 DIAGNOSIS — Z8249 Family history of ischemic heart disease and other diseases of the circulatory system: Secondary | ICD-10-CM | POA: Insufficient documentation

## 2010-08-10 DIAGNOSIS — R079 Chest pain, unspecified: Principal | ICD-10-CM | POA: Insufficient documentation

## 2010-08-10 DIAGNOSIS — E785 Hyperlipidemia, unspecified: Secondary | ICD-10-CM | POA: Insufficient documentation

## 2010-08-10 LAB — PROTIME-INR
INR: 0.9 (ref 0.00–1.49)
Prothrombin Time: 12.4 seconds (ref 11.6–15.2)

## 2010-08-10 LAB — COMPREHENSIVE METABOLIC PANEL
ALT: 14 U/L (ref 0–35)
AST: 14 U/L (ref 0–37)
Albumin: 3.2 g/dL — ABNORMAL LOW (ref 3.5–5.2)
Alkaline Phosphatase: 57 U/L (ref 39–117)
BUN: 10 mg/dL (ref 6–23)
CO2: 29 mEq/L (ref 19–32)
Calcium: 8.6 mg/dL (ref 8.4–10.5)
Chloride: 102 mEq/L (ref 96–112)
Creatinine, Ser: 1.06 mg/dL (ref 0.4–1.2)
GFR calc Af Amer: >60 mL/min (ref >60)
GFR calc non Af Amer: 54 mL/min — ABNORMAL LOW (ref >60)
Glucose, Bld: 124 mg/dL — ABNORMAL HIGH (ref 70–99)
Potassium: 4 mEq/L (ref 3.5–5.1)
Sodium: 138 mEq/L (ref 135–145)
Total Bilirubin: 0.2 mg/dL — ABNORMAL LOW (ref 0.3–1.2)
Total Protein: 5.7 g/dL — ABNORMAL LOW (ref 6.0–8.3)

## 2010-08-10 LAB — DIFFERENTIAL
Basophils Absolute: 0 10*3/uL (ref 0.0–0.1)
Basophils Relative: 0 % (ref 0–1)
Eosinophils Absolute: 0.2 10*3/uL (ref 0.0–0.7)
Eosinophils Relative: 3 % (ref 0–5)
Lymphocytes Relative: 29 % (ref 12–46)
Lymphs Abs: 1.8 10*3/uL (ref 0.7–4.0)
Monocytes Absolute: 0.4 10*3/uL (ref 0.1–1.0)
Monocytes Relative: 6 % (ref 3–12)
Neutro Abs: 3.9 10*3/uL (ref 1.7–7.7)
Neutrophils Relative %: 62 % (ref 43–77)

## 2010-08-10 LAB — CK TOTAL AND CKMB (NOT AT ARMC)
CK, MB: 0.8 ng/mL (ref 0.3–4.0)
CK, MB: 0.8 ng/mL (ref 0.3–4.0)
CK, MB: 0.8 ng/mL (ref 0.3–4.0)
CK, MB: 0.8 ng/mL (ref 0.3–4.0)
Relative Index: INVALID (ref 0.0–2.5)
Relative Index: INVALID (ref 0.0–2.5)
Relative Index: INVALID (ref 0.0–2.5)
Relative Index: INVALID (ref 0.0–2.5)
Total CK: 42 U/L (ref 7–177)
Total CK: 36 U/L (ref 7–177)
Total CK: 37 U/L (ref 7–177)
Total CK: 38 U/L (ref 7–177)

## 2010-08-10 LAB — RAPID URINE DRUG SCREEN, HOSP PERFORMED
Amphetamines: NOT DETECTED
Barbiturates: NOT DETECTED
Benzodiazepines: NOT DETECTED
Cocaine: NOT DETECTED
Opiates: POSITIVE — AB
Tetrahydrocannabinol: NOT DETECTED

## 2010-08-10 LAB — HEMOGLOBIN A1C
Hgb A1c MFr Bld: 6 % — ABNORMAL HIGH (ref <5.7)
Mean Plasma Glucose: 126 mg/dL — ABNORMAL HIGH (ref <117)

## 2010-08-10 LAB — CBC
HCT: 43.1 % (ref 36.0–46.0)
Hemoglobin: 14.3 g/dL (ref 12.0–15.0)
MCH: 31 pg (ref 26.0–34.0)
MCHC: 33.2 g/dL (ref 30.0–36.0)
MCV: 93.5 fL (ref 78.0–100.0)
Platelets: 168 10*3/uL (ref 150–400)
RBC: 4.61 MIL/uL (ref 3.87–5.11)
RDW: 14.3 % (ref 11.5–15.5)
WBC: 6.3 10*3/uL (ref 4.0–10.5)

## 2010-08-10 LAB — TROPONIN I
Troponin I: <0.01 ng/mL (ref 0.00–0.06)
Troponin I: <0.01 ng/mL (ref 0.00–0.06)
Troponin I: <0.01 ng/mL (ref 0.00–0.06)
Troponin I: <0.01 ng/mL (ref 0.00–0.06)

## 2010-08-10 LAB — TSH: TSH: 2.137 u[IU]/mL (ref 0.350–4.500)

## 2010-08-10 LAB — LIPASE, BLOOD: Lipase: 20 U/L (ref 11–59)

## 2010-08-11 ENCOUNTER — Observation Stay (HOSPITAL_COMMUNITY): Payer: BC Managed Care – PPO

## 2010-08-11 DIAGNOSIS — R079 Chest pain, unspecified: Secondary | ICD-10-CM

## 2010-08-11 LAB — CARDIAC PANEL(CRET KIN+CKTOT+MB+TROPI)
CK, MB: 0.9 ng/mL (ref 0.3–4.0)
Relative Index: INVALID (ref 0.0–2.5)
Total CK: 40 U/L (ref 7–177)
Troponin I: 0.01 ng/mL (ref 0.00–0.06)

## 2010-08-11 LAB — CBC
HCT: 40.5 % (ref 36.0–46.0)
Hemoglobin: 13.2 g/dL (ref 12.0–15.0)
MCH: 30.5 pg (ref 26.0–34.0)
MCHC: 32.6 g/dL (ref 30.0–36.0)
MCV: 93.5 fL (ref 78.0–100.0)
Platelets: 160 10*3/uL (ref 150–400)
RBC: 4.33 MIL/uL (ref 3.87–5.11)
RDW: 14.2 % (ref 11.5–15.5)
WBC: 4.9 10*3/uL (ref 4.0–10.5)

## 2010-08-11 LAB — BASIC METABOLIC PANEL
BUN: 12 mg/dL (ref 6–23)
CO2: 29 mEq/L (ref 19–32)
Calcium: 8.5 mg/dL (ref 8.4–10.5)
Chloride: 102 mEq/L (ref 96–112)
Creatinine, Ser: 1.11 mg/dL (ref 0.4–1.2)
GFR calc Af Amer: >60 mL/min (ref >60)
GFR calc non Af Amer: 52 mL/min — ABNORMAL LOW (ref >60)
Glucose, Bld: 106 mg/dL — ABNORMAL HIGH (ref 70–99)
Potassium: 4.1 mEq/L (ref 3.5–5.1)
Sodium: 138 mEq/L (ref 135–145)

## 2010-08-11 LAB — LIPID PANEL
Cholesterol: 106 mg/dL (ref 0–200)
HDL: 35 mg/dL — ABNORMAL LOW (ref >39)
LDL Cholesterol: 45 mg/dL (ref 0–99)
Total CHOL/HDL Ratio: 3 RATIO
Triglycerides: 130 mg/dL (ref <150)
VLDL: 26 mg/dL (ref 0–40)

## 2010-08-11 MED ORDER — TECHNETIUM TC 99M TETROFOSMIN IV KIT
30.0000 | PACK | Freq: Once | INTRAVENOUS | Status: AC | PRN
  Administered 2010-08-11: 30 via INTRAVENOUS

## 2010-08-11 MED ORDER — TECHNETIUM TC 99M TETROFOSMIN IV KIT
10.0000 | PACK | Freq: Once | INTRAVENOUS | Status: AC | PRN
  Administered 2010-08-11: 10 via INTRAVENOUS

## 2010-08-11 NOTE — H&P (Addendum)
[name], SAMARIN NO.:  [id number]  MEDICAL RECORD NO.:  [id number]           PATIENT TYPE:  I  LOCATION:  3706                         FACILITY:  [facility]  PHYSICIAN:  Kule Gascoigne M. [place], M.D.  DATE OF BIRTH:  05/28/1961  DATE OF ADMISSION:  08/10/2010 DATE OF DISCHARGE:                             HISTORY & PHYSICAL   PRIMARY CARDIOLOGIST:  New to [facility].  PRIMARY MEDICAL DOCTOR:  Valetta Mole. Swords, MD  CHIEF COMPLAINT:  Chest pain.  HISTORY OF PRESENT ILLNESS:  [name] is a 50 year old female with history of ongoing tobacco abuse, hypertension, hyperlipidemia, and family history of coronary artery disease with no personal history of coronary artery disease.  She presents to [facility] today with complaints of substernal chest pain that also was felt in left axilla which she describes as a pressure-like sensation.  She did have some brief left arm pain.  This has been occurring for the past 3-4 days, more than once a day lasting approximately 10 minutes at a time.  It is not associated with any other symptoms.  Denies shortness of breath, nausea, vomiting, diaphoresis, syncope, or palpitations.  She feels the pain primarily when resting and does not feel that it is associated with exertion, meals, or inspiration.  She tried Prilosec with minimal relief, milk with no relief, and over-the-counter gas tablets with no relief. However, here in the ER states the GI cocktail relieved it completely. She feels better now in terms of her chest discomfort.  She is having some heartburn which she states is typical heartburn.  She notes that it is associated with eating, and it started occurring after she drank some tea.  No shortness of breath.  Enzymes are negative x2 and EKG is without acute changes.  PAST MEDICAL HISTORY: 1. GERD. 2. Hyperlipidemia. 3. Hypertension. 4. Left tib-fib fracture in 2009. 5. Chest pain, evaluation in 2009 when  the patient had a stress test.     She states she was walking, but the test was not sufficient and she     was going to be scheduled for a followup test, but this failed     through and she did not pursue it further. 6. Atrophic right kidney by ultrasound on August 08, 2009, and felt     nonspecific, but could reflect renal artery stenosis per Radiology     report, followed by Dr. Cato Mulligan per the patient.  7. 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 (The patient may require radial cath if she should ever need catheterization.) 8. Chronic pain  PAST SURGICAL HISTORY:  Cholecystectomy, partial hysterectomy, 2 back surgeries leading to nerve damage for which she takes chronic pain medication, and benign breast lumpectomy.  MEDICATIONS: 1. Lipitor 80 mg 1/2 tablet daily. 2. Lisinopril 20 mg 1/2 tablet daily. 3. Estradiol 2 mg daily. 4. OxyContin 40 mg t.i.d. 5. Hydrocodone/APAP 10/325 mg q.4 h. p.r.n. pain breakthrough. 6. Provigil 200 mg 1.5 tablets p.r.n. 7. Omeprazole 20 mg p.r.n.  ALLERGIES:  CODEINE and SULFA.  SOCIAL HISTORY:  [name] lives in [place] with her  husband.  She has 2 children.  She works as a Conservation officer, nature at [employer].  She has smoked 1- 1/2 to 2 packs daily since age 67.  She denies any alcohol or illicit drug use.  FAMILY HISTORY:  Mother died at age 26 and had diabetes, hypertension, and hyperlipidemia as well as cardiac stenting which was done sometime in her 60s.  She also had CHF.  She ultimately died from a staph infection after a stent was put in her renal arteries.  Her father is living at age 31 or so, and had stenting recently in the past 3 years to his heart.  Her brother and sister both have murmurs, which she thinks have been worked up with no further intervention.  REVIEW OF SYSTEMS:  No fevers or chills.  She gets hot flashes occasionally.  No nausea, vomiting, diarrhea, bright red blood per rectum, melena, or  hematemesis.  Positive for occasional urinary frequency.  All other systems are reviewed and negative.  LABORATORY DATA:  WBC 6.3, hemoglobin 14.3, hematocrit 43.1, and platelet count 168.  Sodium 138, potassium 4.0, chloride 102, CO2 of 29, glucose 124, BUN 10, and creatinine 1.06.  Please note that her CBC and CMET are somehow available in the ER [facility] electronic medical records, but not uploaded to e-chart yet.  Amylase is 20.  Cardiac enzymes negative x2.  RADIOLOGY:  Chest x-ray showed no acute cardiopulmonary disease.  EKG:  Normal sinus rhythm, low-voltage anteriorly with subtle T-wave inversion in V3, unchanged from prior.  PHYSICAL EXAMINATION:  VITAL SIGNS:  Temperature 97.4, pulse 60, respirations 18, blood pressure 117/88, pulse ox 95% on room air. GENERAL:  This is a pleasant white female in no acute distress. HEENT:  Normocephalic and atraumatic with extraocular movements are intact.  Clear sclerae.  Nares without discharge. NECK:  Supple without carotid bruit or JVD. HEART:  Auscultation of the heart reveals quiet heart sounds, but regular rate and rhythm with S1 and S2 without murmur, gallops, or rubs. LUNGS:  Decreased breath sounds throughout, but breathing is unlabored. No wheezes, rales, or rhonchi. ABDOMEN:  Soft, nontender, and nondistended with positive bowel sounds. No rebound.  No guarding.  No hepatosplenomegaly. EXTREMITIES:  Warm, dry, and without edema.  She has decreased but palpable pedal pulses bilaterally. NEUROLOGICAL:  She is alert and oriented x2 and responds questions appropriately with a normal affect.  ASSESSMENT/PLAN:  [name] was seen and examined by Dr. Swaziland and myself in the ER.  This is a 50 year old female with multiple cardiac risk factors including hypertension, hyperlipidemia, obesity, ongoing tobacco abuse, and family history of coronary artery disease who presents with a 2-week history of progressive chest pain worsening  over the last 3-4 days that she describes as a heartburn sensation without associated symptoms.  She has gotten complete relief here in the hospital with a GI cocktail.  The pain is not exertional, cardiac enzymes are negative x2 and EKG shows no acute changes.  Her prior stress test in 2009 was nondiagnostic.  At this time, we will admit the patient to the hospital and cycle cardiac enzymes to rule out myocardial infarction.  A PPI has been initiated along with other home medications per the ED physician already.  The initial plan was to pursue a dobutamine stress echocardiogram per the CDU protocol, but this is no longer being done by the ER, so she will be admitted for our service. This particular episode is not clearly anginal given its atypical nature, possibly  GI in nature.  However, given her risk factors, we will keep her n.p.o. after midnight and plan for Lexiscan Myoview in the morning.  If it is negative, she may be discharged home with focus on aggressive risk factor modification including tobacco cessation counseling which was provided in the ER.     Ronie Spies, P.A.C.   ______________________________ Astin Rape M. [place], M.D.    DD/MEDQ  D:  08/10/2010  T:  08/11/2010  Job:  [id number]  cc:   Valetta Mole. Swords, MD  Electronically Signed by Ronie Spies  on 08/11/2010 11:91:47 PM Electronically Signed by Giulietta Prokop [place] M.D. on 08/12/2010 04:52:33 PM

## 2010-08-12 ENCOUNTER — Observation Stay (HOSPITAL_COMMUNITY): Payer: Self-pay

## 2010-08-21 NOTE — Discharge Summary (Signed)
  NAMEALIVIA, [name] NO.:  [id number]  MEDICAL RECORD NO.:  [id number]           PATIENT TYPE:  I  LOCATION:  3706                         FACILITY:  [facility]  PHYSICIAN:  [name], M.D.  DATE OF BIRTH:  [birth year]  DATE OF ADMISSION:  08/10/2010 DATE OF DISCHARGE:  08/11/2010                              DISCHARGE SUMMARY   PROCEDURES:  Lexiscan Myoview.  PRIMARY FINAL DISCHARGE DIAGNOSIS:  Chest pain, cardiac enzymes negative for myocardial infarction, and Myoview negative for ischemia.  SECONDARY DIAGNOSES: 1. Tobacco use. 2. Hypertension. 3. Hyperlipidemia. 4. Obesity. 5. Family history of coronary artery disease. 6. Gastroesophageal reflux disease. 7. History of left tib-fib fracture in 2009. 8. History of chest pain in April 2009. 9. History of cholelithiasis. 10.Status post hysterectomy and left tib-fib repair as well as 2 back     surgeries and a benign lumpectomy. 11.Allergy or intolerance to CODEINE and SULFA.  TIME AT DISCHARGE:  33 minutes.  HOSPITAL COURSE:  [name] is a 50 year old female with no previous history of coronary artery disease.  She had chest pain and came to the hospital where she was admitted for further evaluation.  Her cardiac enzymes were negative for MI.  A lipid profile showed an HDL of 35, LDL of 45.  All other values within normal limits.  A hemoglobin A1c was 6.0.  Her albumin was mildly low and total protein was mildly low at 3.2 and 5.7 respectively.  On August 11, 2010, she had a [employer] that showed breast attenuation with no ischemia or infarct, and an EF of 54%.  Her symptoms had been relieved with a GI cocktail, so there is concern for reflux causing her symptoms.  Her omeprazole will be changed to Protonix 40 mg daily.  [name] is ambulating without chest pain or shortness of breath and considered stable for discharge, to follow up with primary care as an outpatient and with  Cardiology on a p.r.n. basis.  DISCHARGE INSTRUCTIONS:  She is to increase her activities slowly.  She is encouraged to stick to a low-carbohydrate, low-sodium, heart-healthy diet.  She is to follow up with Dr. Cato Morales and call for an appointment. She is to follow up with Dr. Swaziland as needed.  DISCHARGE MEDICATIONS: 1. Lisinopril 20 mg 1/2 tablet daily. 2. Provigil 200 mg 1-1/2 tablets q.a.m. p.r.n. 3. Lyrica 75 mg t.i.d. 4. Estradiol 2 mg a day. 5. Lipitor 80 mg 1/2 tablet daily. 6. Hydrocodone 10/325 one tablet q.4 h. p.r.n. 7. Roxicodone 40 mg t.i.d. as prior to admission. 8. Aspirin 81 mg daily. 9. Protonix 40 mg daily. 10.Omeprazole was discontinued.     Jessica Demark, PA-C   ______________________________ [name], M.D.    RB/MEDQ  D:  08/11/2010  T:  08/12/2010  Job:  [id number]  cc:   Jessica Mole. Swords, MD  Electronically Signed by Jessica Demark PA-C on 08/17/2010 07:00:00 AM Electronically Signed by [name] M.D. on 08/21/2010 11:17:18 AM

## 2010-09-07 ENCOUNTER — Other Ambulatory Visit: Payer: Self-pay | Admitting: Internal Medicine

## 2010-09-20 LAB — DIFFERENTIAL
Basophils Absolute: 0 10*3/uL (ref 0.0–0.1)
Basophils Relative: 0 % (ref 0–1)
Eosinophils Absolute: 0 10*3/uL (ref 0.0–0.7)
Eosinophils Relative: 0 % (ref 0–5)
Lymphocytes Relative: 9 % — ABNORMAL LOW (ref 12–46)
Lymphs Abs: 0.4 10*3/uL — ABNORMAL LOW (ref 0.7–4.0)
Monocytes Absolute: 0.1 10*3/uL (ref 0.1–1.0)
Monocytes Relative: 1 % — ABNORMAL LOW (ref 3–12)
Neutro Abs: 4.3 10*3/uL (ref 1.7–7.7)
Neutrophils Relative %: 90 % — ABNORMAL HIGH (ref 43–77)

## 2010-09-20 LAB — URINE CULTURE: Colony Count: 10000

## 2010-09-20 LAB — URINE MICROSCOPIC-ADD ON

## 2010-09-20 LAB — CBC
HCT: 38.7 % (ref 36.0–46.0)
Hemoglobin: 13.3 g/dL (ref 12.0–15.0)
MCHC: 34.3 g/dL (ref 30.0–36.0)
MCV: 91 fL (ref 78.0–100.0)
Platelets: 135 10*3/uL — ABNORMAL LOW (ref 150–400)
RBC: 4.26 MIL/uL (ref 3.87–5.11)
RDW: 13.3 % (ref 11.5–15.5)
WBC: 4.8 10*3/uL (ref 4.0–10.5)

## 2010-09-20 LAB — POCT I-STAT, CHEM 8
BUN: 18 mg/dL (ref 6–23)
Calcium, Ion: 1.13 mmol/L (ref 1.12–1.32)
Chloride: 99 mEq/L (ref 96–112)
Creatinine, Ser: 1.4 mg/dL — ABNORMAL HIGH (ref 0.4–1.2)
Glucose, Bld: 121 mg/dL — ABNORMAL HIGH (ref 70–99)
HCT: 40 % (ref 36.0–46.0)
Hemoglobin: 13.6 g/dL (ref 12.0–15.0)
Potassium: 4.1 mEq/L (ref 3.5–5.1)
Sodium: 137 mEq/L (ref 135–145)
TCO2: 25 mmol/L (ref 0–100)

## 2010-09-20 LAB — URINALYSIS, ROUTINE W REFLEX MICROSCOPIC
Bilirubin Urine: NEGATIVE
Glucose, UA: NEGATIVE mg/dL
Ketones, ur: NEGATIVE mg/dL
Nitrite: POSITIVE — AB
Protein, ur: 100 mg/dL — AB
Specific Gravity, Urine: 1.014 (ref 1.005–1.030)
Urobilinogen, UA: 1 mg/dL (ref 0.0–1.0)
pH: 6 (ref 5.0–8.0)

## 2010-10-06 ENCOUNTER — Other Ambulatory Visit: Payer: Self-pay | Admitting: Internal Medicine

## 2010-10-27 NOTE — Op Note (Signed)
NAMESALOMA, CADENA               ACCOUNT NO.:  [id number]   MEDICAL RECORD NO.:  [id number]          PATIENT TYPE:  INP   LOCATION:  1606                         FACILITY:  [facility]   PHYSICIAN:  Vanita Panda. Magnus Ivan, M.D.DATE OF BIRTH:  [birth year]   DATE OF PROCEDURE:  04/26/2008  DATE OF DISCHARGE:                               OPERATIVE REPORT   PREOPERATIVE DIAGNOSIS:  Left mid shaft tibia/fibula fracture.   POSTOPERATIVE DIAGNOSIS:  Left mid shaft tibia/fibula fracture.   PROCEDURE:  Intramedullary nail placement left tibia, reamed  intramedullary nail placement left tibia.   IMPLANTS:  Smith and Nephew 10 x 32 tibial nail with two proximal and  two distal interlocks.   SURGEON:  Kathryne Hitch, MD   ANESTHESIA:  1. Left leg popliteal block.  2. General.   ANTIBIOTICS:  One gram IV Ancef.   BLOOD LOSS:  Two hundred mL.   COMPLICATIONS:  None.   INDICATIONS:  Briefly Ms. Paugh is a 50 year old female who was injured  in a work-related accident today.  Some type of pallet with heavy  material fell on her left leg.  She was transported from work to the  [employer].  X-rays were obtained and she was found to  have a left tibia/fibula fracture.  The was in the mid shaft area.  This  with a closed fracture and she had no evidence of compartment syndrome.  It was recommended to treat this fracture that she undergo  intramedullary nail placement.  The risks and benefits of this were  explained to her in length.  She agreed to proceed with the surgery.   PROCEDURE IN DETAIL:  After informed consent was obtained the  appropriate left leg was marked.  She was brought to the operating room.  Prior to bringing her right to the operating room anesthesia was  obtained with a popliteal block.  In the operating room she was placed  supine on the flat Jackson radiolucent table.  General anesthesia was  then obtained.  A nonsterile tourniquet was placed  around her upper leg  and a Foley catheter was placed.  Her left leg was then prepped and  draped in its entirety with DuraPrep and sterile drapes.  A time-out was  called and she was  identified as the correct patient and the correct  left leg.  A radiolucent triangle was then placed under the knee and a  small midline incision was made at the inferior pole of the patella and  carried just distal.  I divided the soft tissue down to the patellar  tendon.  I then divided the peritenon and took a medial parapatellar  approach to the proximal tibia.  Using a guide pin under direct  fluoroscopic guidance I placed a guide pin from the proximal tibia down  to distally.  I then over reamed this with the initiating reamer to gain  access to the tibial canal.  A guide pin was then inserted in an  antegrade fashion from the proximal tibia crossing the fracture site and  down to the level of the  ankle in almost a center-center position.  I  then held the fracture in reduced position and reamed the tibial canal  in 5 mm increments starting from 8 mm up to 11 mm.  A measurement was  made off of the guide pin and I chose a 10 mm x 32 cm tibial nail.  This  was placed in an antegrade fashion over the guide rod and then the guide  rod was removed.  The tibia was held in reduced position, verified in  the AP and lateral planes under fluoroscopic guidance.  Using the  outrigger guide from the tibial nail I then placed two proximal  interlocks from medial to lateral direction.  I then removed the  outrigger guide and placed two distal interlocks from a medial to  lateral direction, all through separate stab incisions.  I then put the  knee and leg through a range of motion and the knee and ankle were found  to be stable.  The legs resumed a normal rotated position.  All the  wounds were then copiously irrigated and I closed the tibial incision  with the deep tissue with zero Vicryl followed by the peritenon  was zero  Vicryl, 2-0 Vicryl in subcutaneous tissue and interrupted staples on the  knee incision as well as the interlock incisions.  Xeroform followed by  well-padded dry dressing was applied.  The patient was awakened,  extubated and taken to the recovery room in stable condition.  All final  counts were correct and there were no complications noted.      Vanita Panda. Magnus Ivan, M.D.  Electronically Signed     CYB/MEDQ  D:  04/26/2008  T:  04/27/2008  Job:  [id number]

## 2010-10-27 NOTE — Discharge Summary (Signed)
[name], [name]               ACCOUNT NO.:  [id number]   MEDICAL RECORD NO.:  [id number]          PATIENT TYPE:  INP   LOCATION:  3707                         FACILITY:  [facility]   PHYSICIAN:  Bruce Rexene Edison. Swords, MD    DATE OF BIRTH:  1961-03-07   DATE OF ADMISSION:  09/30/2007  DATE OF DISCHARGE:  10/01/2007                               DISCHARGE SUMMARY   DISCHARGE DIAGNOSES:  1. Chest pain.  2. Chronic pain syndrome.  3. Cholelithiasis.  4. Gastroesophageal reflux disease.  5. Hyperlipidemia.  6. Status post hysterectomy.   DISCHARGE MEDICATIONS:  1. Aspirin 81 mg p.o. daily.  2. Lyrica 75 mg t.i.d.  3. Vivelle-Dot 1 mg change twice weekly.  4. OxyContin 40 mg t.i.d.  5. Percocet p.r.n.  6. Provigil 200 mg p.o. daily.  7. Lipitor 40 mg one-half tablet every other day.   FOLLOWUP:  Plan with Dr. Cato Mulligan tomorrow.   CONDITION ON DISCHARGE:  Improved.   HOSPITAL COURSE:  The patient availed the hospital service on September 30, 2007.  The patient presented with chest pain.  See admission of her  detail.  Cardiac enzymes remained negative.  EKG remained negative.  The  patient is discharged home in stable condition.  She has followup plans  with me tomorrow to further evaluate possible etiology of chest  discomfort and arm discomfort.      Bruce Rexene Edison Swords, MD  Electronically Signed     BHS/MEDQ  D:  10/01/2007  T:  10/02/2007  Job:  [id number]

## 2010-10-28 ENCOUNTER — Ambulatory Visit (INDEPENDENT_AMBULATORY_CARE_PROVIDER_SITE_OTHER): Payer: BC Managed Care – PPO | Admitting: Women's Health

## 2010-10-28 DIAGNOSIS — R82998 Other abnormal findings in urine: Secondary | ICD-10-CM

## 2010-10-28 DIAGNOSIS — N390 Urinary tract infection, site not specified: Secondary | ICD-10-CM

## 2010-10-30 NOTE — Discharge Summary (Signed)
NAMESAPPHIRE, TYGART               ACCOUNT NO.:  [id number]   MEDICAL RECORD NO.:  [id number]          PATIENT TYPE:  INP   LOCATION:  1523                         FACILITY:  [facility]   PHYSICIAN:  Vanita Panda. Magnus Ivan, M.D.DATE OF BIRTH:  1960-11-21   DATE OF ADMISSION:  04/26/2008  DATE OF DISCHARGE:  04/30/2008                               DISCHARGE SUMMARY   ADMITTING DIAGNOSIS:  Left tibia/fibular fracture.   DISCHARGE DIAGNOSIS:  Left tibia/fibular fracture.   PROCEDURES:  Intramedullary nail placement left tib-fib fracture.   HOSPITAL COURSE:  Briefly, [name] is a 50 year old female who was  injured at work when a heavy palate fell on her left leg sustaining a  significant blow against the leg.  She was brought to the [facility]  emergency room via EMS.  She was found to have a left midshaft tib-fib  fracture.  It was recommended she undergo intramedullary nail placement  due to the nature of the injury.  She was taken to the operating room on  the day of admission where she underwent intramedullary nail placement  into the left tibia without complications.  Postoperatively, she was  admitted to the orthopedic floor bed for convalescence.  She began  working with physical therapy with weightbearing as tolerated and a Cam  walker.  Her hospital stay was prolonged secondary to considerable pain.  She did have a history of chronic pain in general.  This did make it  difficult for examining the motor and sensory of her foot due to  persistent numbness that was preexisting from back surgery.  However,  there was concern that some of this related to her injury.  By the day  of discharge she was finally transitioned to oral pain medications and  tolerating a regular diet, and it was felt that she could be discharged  safely to home with home health physical therapy.   DISPOSITION:  To home.   DISCHARGE INSTRUCTIONS:  While she is at home she will continue all the  medications that she was on prior to admission, and these can be seen in  her medical reconciliation form.  She is on chronic pain management, and  she will be given oral immediate release pain medications including  oxycodone to take to bridge her prior to seeing her chronic pain  specialist.  Home health physical therapy will be set up to continue to  work with balance, gait training, coordination, and weightbearing as  tolerated.  Follow-up in my office will be established in 2 weeks post  discharge.  She will remain out of work until further follow-up as well.      Vanita Panda. Magnus Ivan, M.D.  Electronically Signed     CYB/MEDQ  D:  05/16/2008  T:  05/17/2008  Job:  [id number]

## 2010-10-30 NOTE — Discharge Summary (Signed)
NAMEMOLLIE, ROSSANO               ACCOUNT NO.:  [id number]   MEDICAL RECORD NO.:  [id number]          PATIENT TYPE:  INP   LOCATION:  3020                         FACILITY:  [facility]   PHYSICIAN:  Stefani Dama, M.D.  DATE OF BIRTH:  07-11-1960   DATE OF ADMISSION:  09/18/2004  DATE OF DISCHARGE:  09/21/2004                                 DISCHARGE SUMMARY   ADMISSION DIAGNOSIS:  Lumbar spondylosis and lumbar radiculopathy at L4-L5  and L5-S1.   DISCHARGE DIAGNOSES:  1.  Lumbar spondylosis and lumbar radiculopathy at L4-L5 and L5-S1.  2.  Asthmatic bronchitis, chronic.   CONDITION ON DISCHARGE:  Improving.   HOSPITAL COURSE:  [name] is a 50 year old individual who has had  significant back pain and bilateral leg pain.  She has evidence of severe  spondylitic disease in the lower lumbar spine and was advised regarding  surgical decompression.  She apparently had surgery some 20 years ago.  She  was taken to the operating room on September 18, 2004, where she underwent  decompression with posterior interbody arthrodesis from L4-L5 and L5-S1.  Pedicle screw fixation was performed from L4 to sacrum.  Postoperatively,  she was mobilized on postop day #1 and #2.  Foley catheter was removed.  She  has been tolerating oral pain medication in the form of hydrocodone and is  given a prescription for this at the current time.  Her incision is clean  and dry.  Dressing has been removed.  She has been advised as to her  postoperative activities and will be seen in the office in about 3 weeks  period of time.   DISCHARGE MEDICATIONS:  1.  Vicodin ES, #50, without refills.  2.  Valium 5 mg, #40, without refills.      HJE/MEDQ  D:  09/21/2004  T:  09/21/2004  Job:  [id number]

## 2010-10-30 NOTE — Op Note (Signed)
[name], [name] NO.:  [id number]   MEDICAL RECORD NO.:  [id number]          PATIENT TYPE:  INP   LOCATION:  2899                         FACILITY:  [facility]   PHYSICIAN:  Hilda Lias, M.D.   DATE OF BIRTH:  [birth year]   DATE OF PROCEDURE:  09/18/2004  DATE OF DISCHARGE:                                 OPERATIVE REPORT   PREOPERATIVE DIAGNOSIS:  Degenerative disc disease at L4-L5 and L5-S1 with  chronic radiculopathy and status post lumbar surgery more than 25 years ago.   POSTOPERATIVE DIAGNOSIS:  Degenerative disc disease at L4-L5 and L5-S1 with  chronic radiculopathy and status post lumbar surgery more than 25 years ago.   PROCEDURE:  Bilateral L4-L5 laminectomy and facetectomy, interbody fusion  with allograft and BMP, pedicle screws from L4 to S1 bilaterally,  posterolateral arthrodesis, Cell Saver, C-arm.   SURGEON:  Hilda Lias, M.D.   ASSISTANT:  Coletta Memos, M.D.   CLINICAL HISTORY:  The patient is being admitted because of back pain with  radiation to both legs.  The patient, more than 20 years ago, underwent  surgery.  She has a midline scar from T12 down to S1.  Unknown what type of  surgery she had.  Nevertheless, she has back pain which is constant.  X-ray  shows severe degenerative disc disease at L4-L5 and L5-S1.  I told this lady  we would try conservative treatment, but she wanted to proceed with surgery.   PROCEDURE:  The patient was taken to the OR.  A midline incision following  the previous one was made through the skin and subcutaneous tissue.  The  muscle was retracted.  Indeed, the patient has arthropathy and it was  difficult to differentiate the spinous process.  Nevertheless, after  dissection, we removed the spinous process of L4-L5.  We went laterally and  removed the lamina and the facet of L4 and L5.  The patient had quite a bit  of scar tissue mostly on the left side.  To enter the disc space at L4-L5  and L5-S1 it  was quite difficult to the point that we had to drill to get  into the space.  On the left side, we found quite a bit of adhesions and it  was quite difficult to mobilize the thecal sac.  Nevertheless, we were able  to get into the disc space at L4-L5 and L5-S1 and total gross discectomy was  achieved.  Then, insertion of allograft, 8 by 22, was done bilaterally.  Having good position of the bone graft at both disc spaces, we used a mix of  BMP as well as autograft to fill up the lateral disc.  There was no contact,  whatsoever, of the BMP and the dural sac.  We used the C-arm and identified  the pedicle of L4, L5, and S1.  Pedicle probe was done followed by tap and a  total of six screws, two 45 length and the other 40, 6.5 diameter.  X-ray  done in the from and lateral views showed the pedicle screws were in good  position.  Nevertheless, using the hook, we were able to see that the  pedicle screws were within the pedicle, itself.  There was not any  compromise with the nerve root.  Then, a rod from the pedicle screw from L4  down to S1 was placed bilaterally and both were united using a crosslink.  Laterally, we removed the periosteum of the transverse process of L4-L5 and  the ala of the sacrum.  A  mix of BMP and autograft was used to fill up the canal laterally.  From then  on, the area was irrigated.  Valsalva maneuver was negative.  Fentanyl was  left in the dural space and the wound was closed with Vicryl and Steri-  Strips.  The patient did very well and she is going to go to the recovery  room.      EB/MEDQ  D:  09/18/2004  T:  09/18/2004  Job:  [id number]

## 2011-03-09 LAB — POCT I-STAT, CHEM 8
BUN: 10
BUN: 11
Calcium, Ion: 1.03 — ABNORMAL LOW
Calcium, Ion: 1.18
Chloride: 103
Chloride: 105
Creatinine, Ser: 0.8
Creatinine, Ser: 1
Glucose, Bld: 83
Glucose, Bld: 84
HCT: 47 — ABNORMAL HIGH
HCT: 48 — ABNORMAL HIGH
Hemoglobin: 16 — ABNORMAL HIGH
Hemoglobin: 16.3 — ABNORMAL HIGH
Potassium: 4.2
Potassium: 8.2
Sodium: 135
Sodium: 138
TCO2: 28
TCO2: 29

## 2011-03-09 LAB — CARDIAC PANEL(CRET KIN+CKTOT+MB+TROPI)
CK, MB: 0.8
Relative Index: INVALID
Total CK: 34
Troponin I: 0.01

## 2011-03-09 LAB — CBC
HCT: 44.4
Hemoglobin: 15.1 — ABNORMAL HIGH
MCHC: 34
MCV: 91.3
Platelets: 160
RBC: 4.86
RDW: 13.5
WBC: 7

## 2011-03-09 LAB — POCT CARDIAC MARKERS
CKMB, poc: <1 — ABNORMAL LOW
CKMB, poc: <1 — ABNORMAL LOW
Myoglobin, poc: 29.1
Myoglobin, poc: 30
Operator id: 196461
Operator id: 196461
Troponin i, poc: <0.05
Troponin i, poc: <0.05

## 2011-03-09 LAB — DIFFERENTIAL
Basophils Absolute: 0.1
Basophils Relative: 1
Eosinophils Absolute: 0.1
Eosinophils Relative: 1
Lymphocytes Relative: 27
Lymphs Abs: 1.9
Monocytes Absolute: 0.4
Monocytes Relative: 5
Neutro Abs: 4.6
Neutrophils Relative %: 66

## 2011-03-09 LAB — CK TOTAL AND CKMB (NOT AT ARMC)
CK, MB: 0.7
Relative Index: INVALID
Total CK: 49

## 2011-03-09 LAB — PROTIME-INR
INR: 0.9
Prothrombin Time: 12.3

## 2011-03-09 LAB — TROPONIN I: Troponin I: <0.01

## 2011-03-09 LAB — D-DIMER, QUANTITATIVE: D-Dimer, Quant: 0.27

## 2011-03-16 LAB — BASIC METABOLIC PANEL
BUN: 10
CO2: 26
Calcium: 8.7
Chloride: 107
Creatinine, Ser: 0.71
GFR calc Af Amer: >60
GFR calc non Af Amer: >60
Glucose, Bld: 119 — ABNORMAL HIGH
Potassium: 4
Sodium: 138

## 2011-03-16 LAB — TYPE AND SCREEN
ABO/RH(D): A POS
Antibody Screen: NEGATIVE

## 2011-03-16 LAB — DIFFERENTIAL
Basophils Absolute: 0
Basophils Relative: 0
Eosinophils Absolute: 0
Eosinophils Relative: 0
Lymphocytes Relative: 9 — ABNORMAL LOW
Lymphs Abs: 0.9
Monocytes Absolute: 0.2
Monocytes Relative: 2 — ABNORMAL LOW
Neutro Abs: 8.2 — ABNORMAL HIGH
Neutrophils Relative %: 88 — ABNORMAL HIGH

## 2011-03-16 LAB — ABO/RH: ABO/RH(D): A POS

## 2011-03-16 LAB — CBC
HCT: 35 — ABNORMAL LOW
HCT: 42.2
Hemoglobin: 11.9 — ABNORMAL LOW
Hemoglobin: 14
MCHC: 33.3
MCHC: 33.3
MCV: 93.2
MCV: 93.4
Platelets: 154
Platelets: 172
RBC: 3.74 — ABNORMAL LOW
RBC: 4.52
RDW: 13.6
RDW: 13.7
WBC: 7.7
WBC: 9.3

## 2011-03-16 LAB — APTT: aPTT: 24

## 2011-03-16 LAB — PROTIME-INR
INR: 1
Prothrombin Time: 12.8

## 2011-03-21 ENCOUNTER — Other Ambulatory Visit: Payer: Self-pay | Admitting: Women's Health

## 2011-03-24 LAB — COMPREHENSIVE METABOLIC PANEL
ALT: 20
ALT: 20
AST: 15
AST: 16
Albumin: 3.5
Albumin: 3.6
Alkaline Phosphatase: 64
Alkaline Phosphatase: 64
BUN: 10
BUN: 9
CO2: 25
CO2: 27
Calcium: 9.4
Calcium: 9.6
Chloride: 106
Chloride: 106
Creatinine, Ser: 0.68
Creatinine, Ser: 0.77
GFR calc Af Amer: >60
GFR calc Af Amer: >60
GFR calc non Af Amer: >60
GFR calc non Af Amer: >60
Glucose, Bld: 132 — ABNORMAL HIGH
Glucose, Bld: 134 — ABNORMAL HIGH
Potassium: 3.7
Potassium: 4.3
Sodium: 140
Sodium: 141
Total Bilirubin: 0.3
Total Bilirubin: 0.5
Total Protein: 6.2
Total Protein: 6.2

## 2011-03-24 LAB — CBC
HCT: 43.1
HCT: 43.9
Hemoglobin: 14.8
Hemoglobin: 14.9
MCHC: 33.9
MCHC: 34.3
MCV: 89.9
MCV: 91
Platelets: 204
Platelets: 209
RBC: 4.8
RBC: 4.83
RDW: 13.4
RDW: 13.6
WBC: 5.9
WBC: 7.5

## 2011-03-24 LAB — DIFFERENTIAL
Basophils Absolute: 0
Basophils Absolute: 0
Basophils Relative: 0
Basophils Relative: 0
Eosinophils Absolute: 0
Eosinophils Absolute: 0
Eosinophils Relative: 0
Eosinophils Relative: 0
Lymphocytes Relative: 13
Lymphocytes Relative: 14
Lymphs Abs: 0.8
Lymphs Abs: 1
Monocytes Absolute: 0.2
Monocytes Absolute: 0.3
Monocytes Relative: 3
Monocytes Relative: 4
Neutro Abs: 4.9
Neutro Abs: 6.1
Neutrophils Relative %: 81 — ABNORMAL HIGH
Neutrophils Relative %: 84 — ABNORMAL HIGH

## 2011-03-24 LAB — URINALYSIS, ROUTINE W REFLEX MICROSCOPIC
Bilirubin Urine: NEGATIVE
Glucose, UA: NEGATIVE
Hgb urine dipstick: NEGATIVE
Ketones, ur: NEGATIVE
Nitrite: NEGATIVE
Protein, ur: NEGATIVE
Specific Gravity, Urine: 1.014
Urobilinogen, UA: 0.2
pH: 7.5

## 2011-03-24 LAB — LIPASE, BLOOD: Lipase: 53

## 2011-03-24 LAB — CK TOTAL AND CKMB (NOT AT ARMC)
CK, MB: 1.1
Relative Index: INVALID
Total CK: 78

## 2011-03-24 LAB — URINE MICROSCOPIC-ADD ON

## 2011-04-23 ENCOUNTER — Other Ambulatory Visit (HOSPITAL_COMMUNITY)
Admission: RE | Admit: 2011-04-23 | Discharge: 2011-04-23 | Disposition: A | Discharge status: Home / Self Care | Payer: BC Managed Care – PPO | Source: Ambulatory Visit | Attending: Women's Health | Admitting: Women's Health

## 2011-04-23 ENCOUNTER — Encounter: Payer: Self-pay | Admitting: Women's Health

## 2011-04-23 ENCOUNTER — Ambulatory Visit (INDEPENDENT_AMBULATORY_CARE_PROVIDER_SITE_OTHER): Payer: BC Managed Care – PPO | Admitting: Women's Health

## 2011-04-23 VITALS — BP 130/80 | Ht 65.0 in | Wt 180.0 lb

## 2011-04-23 DIAGNOSIS — Z23 Encounter for immunization: Secondary | ICD-10-CM

## 2011-04-23 DIAGNOSIS — Z01419 Encounter for gynecological examination (general) (routine) without abnormal findings: Secondary | ICD-10-CM | POA: Insufficient documentation

## 2011-04-23 DIAGNOSIS — E785 Hyperlipidemia, unspecified: Secondary | ICD-10-CM | POA: Insufficient documentation

## 2011-04-23 DIAGNOSIS — N951 Menopausal and female climacteric states: Secondary | ICD-10-CM

## 2011-04-23 DIAGNOSIS — Z780 Asymptomatic menopausal state: Secondary | ICD-10-CM

## 2011-04-23 MED ORDER — ESTRADIOL 0.075 MG/24HR TD PTWK: 1.0000 | MEDICATED_PATCH | TRANSDERMAL | Status: DC

## 2011-04-23 NOTE — Progress Notes (Signed)
[name] March 10, 1961 [id number]    History:    The patient presents for annual exam.  Husband had MI this year.   Past medical history, past surgical history, family history and social history were all reviewed and documented in the EPIC chart.   ROS:  A  ROS was performed and pertinent positives and negatives are included in the history.  Exam:  Filed Vitals:   04/23/11 1055  BP: 130/80    General appearance:  Normal Head/Neck:  Normal, without cervical or supraclavicular adenopathy. Thyroid:  Symmetrical, normal in size, without palpable masses or nodularity. Respiratory  Effort:  Normal  Auscultation:  Clear without wheezing or rhonchi Cardiovascular  Auscultation:  Regular rate, without rubs, murmurs or gallops  Edema/varicosities:  Not grossly evident Abdominal  Soft,nontender, without masses, guarding or rebound.  Liver/spleen:  No organomegaly noted  Hernia:  None appreciated  Skin  Inspection:  Grossly normal  Palpation:  Grossly normal Neurologic/psychiatric  Orientation:  Normal with appropriate conversation.  Mood/affect:  Normal  Genitourinary    Breasts: Examined lying and sitting.     Right: Without masses, retractions, discharge or axillary adenopathy.     Left: Without masses, retractions, discharge or axillary adenopathy.   Inguinal/mons:  Normal without inguinal adenopathy  External genitalia:  Normal  BUS/Urethra/Skene's glands:  Normal  Bladder:  Normal  Vagina:  Normal  Cervix:  Normal  Uterus:  Absent  Adnexa/parametria:     Rt: Without masses or tenderness.   Lt: Without masses or tenderness.  Anus and perineum: Normal  Digital rectal exam: Normal sphincter tone without palpated masses or tenderness  Assessment/Plan:  50 y.o.  for annual exam. Postmenopausal on estradiol 1.5 with occasional hot flushes. History of a supracervical hysterectomy in 98 for dysmenorrhea and menorrhagia. History of  normal Paps. Last mammogram several  years ago, normal. Declines colonoscopy, states can't afford has to pay full amount. DEXA normal in 09, T score of -0.9 at spine.  Postmenopausal on ERT  Hypertension/hyperlipidemia- primary care meds and labs Smoker  Plan: UA, vitamin D, Pap. Will try estradiol 0.075 patch weekly and discontinue estradiol 1.5 by mouth daily. Reviewed risks of blood clots, strokes, breast cancer with ERT, also reviewed less liver toxicity since on several other medications with patches. SBEs, annual mammogram, strongly encouraged to schedule mammogram, reviewed importance of annual screening. Home Hemoccult cards given. Aware of the importance of colonoscopy, no family history, reviewed if changes in elimination, or blood in stool to call office. DEXA will schedule here, home safety and fall prevention reviewed. Exercise, calcium rich diet, no smoking or decrease smoking and vitamin D 2000 daily encouraged. Flu shot given today.    Harrington Challenger [facility], 11:43 AM 04/23/2011

## 2011-04-24 LAB — VITAMIN D 25 HYDROXY (VIT D DEFICIENCY, FRACTURES): Vit D, 25-Hydroxy: 27 ng/mL — ABNORMAL LOW (ref 30–89)

## 2011-04-25 ENCOUNTER — Other Ambulatory Visit: Payer: Self-pay | Admitting: Internal Medicine

## 2011-04-26 MED ORDER — LISINOPRIL 20 MG PO TABS: 20.0000 mg | ORAL_TABLET | Freq: Every day | ORAL | Status: DC

## 2011-04-26 MED ORDER — VITAMIN D (ERGOCALCIFEROL) 1.25 MG (50000 UNIT) PO CAPS: 50000.0000 [IU] | ORAL_CAPSULE | ORAL | Status: DC

## 2011-04-26 NOTE — Telephone Encounter (Signed)
Addended by: Alfred Levins D on: 04/26/2011 10:42 AM   Modules accepted: Orders

## 2011-04-26 NOTE — Telephone Encounter (Signed)
Pt need is out of lisinopril

## 2011-04-26 NOTE — Progress Notes (Signed)
Addended by: Harrington Challenger on: 04/26/2011 05:22 PM   Modules accepted: Orders, Level of Service

## 2011-04-26 NOTE — Telephone Encounter (Signed)
Pt scheduled appt for 12/24and I sent in electronic prescription to pharmacy

## 2011-04-26 NOTE — Telephone Encounter (Signed)
Pt needs appt

## 2011-04-26 NOTE — Telephone Encounter (Signed)
Left message for pt to call back to schedule appt

## 2011-04-28 ENCOUNTER — Telehealth: Payer: Self-pay | Admitting: *Deleted

## 2011-04-28 NOTE — Telephone Encounter (Signed)
Pharmacy called to clarify patch strength for patient.  One strength was put in pc and another rx was written and given to patient at visit with another strength.  Clarified with pharmacy to fill rx.

## 2011-05-10 ENCOUNTER — Telehealth: Payer: Self-pay

## 2011-05-10 MED ORDER — ESTRADIOL 2 MG PO TABS: 2.0000 mg | ORAL_TABLET | Freq: Every day | ORAL | Status: DC

## 2011-05-10 NOTE — Telephone Encounter (Signed)
RX DID NOT E-SCRIBE SO I CALLED IT IN.

## 2011-05-10 NOTE — Telephone Encounter (Signed)
PT DOES NOT THINK HORMONE PATCHES ARE HELPING HER AT ALL AND WANTS TO GO BACK TO HER ESTRADIOL RX BY MOUTH.

## 2011-05-10 NOTE — Telephone Encounter (Signed)
[name], please call patient, had been on estradiol 2 mg by mouth daily please call in for her, #30 with 12 refills.Marland Kitchen

## 2011-05-10 NOTE — Telephone Encounter (Signed)
Pt. Notified of [name]'s note and sent rx to [facility]. Pt. Wants 90 day supply so I changed in p.c.

## 2011-05-28 ENCOUNTER — Telehealth: Payer: Self-pay | Admitting: Internal Medicine

## 2011-05-28 MED ORDER — ALPRAZOLAM 0.25 MG PO TABS: 0.2500 mg | ORAL_TABLET | Freq: Every day | ORAL | Status: DC | PRN

## 2011-05-28 NOTE — Telephone Encounter (Signed)
Alprazolam 0.25 mg po qd prn anxiety. #10/0 refills 

## 2011-05-28 NOTE — Telephone Encounter (Signed)
Pt has ov 06-07-11. Pt is requesting xanax call into [facility] [address]. Pt father passed away on 11-04-22.

## 2011-05-28 NOTE — Telephone Encounter (Signed)
rx called in, pt aware 

## 2011-05-31 ENCOUNTER — Other Ambulatory Visit: Payer: Self-pay | Admitting: *Deleted

## 2011-05-31 MED ORDER — ALPRAZOLAM 0.25 MG PO TABS: 0.2500 mg | ORAL_TABLET | Freq: Every day | ORAL | Status: AC | PRN

## 2011-06-07 ENCOUNTER — Encounter: Payer: Self-pay | Admitting: Internal Medicine

## 2011-06-07 ENCOUNTER — Ambulatory Visit (INDEPENDENT_AMBULATORY_CARE_PROVIDER_SITE_OTHER): Payer: BC Managed Care – PPO | Admitting: Internal Medicine

## 2011-06-07 DIAGNOSIS — Z0000 Encounter for general adult medical examination without abnormal findings: Secondary | ICD-10-CM

## 2011-06-07 DIAGNOSIS — F419 Anxiety disorder, unspecified: Secondary | ICD-10-CM | POA: Insufficient documentation

## 2011-06-07 DIAGNOSIS — I10 Essential (primary) hypertension: Secondary | ICD-10-CM

## 2011-06-07 DIAGNOSIS — E785 Hyperlipidemia, unspecified: Secondary | ICD-10-CM

## 2011-06-07 DIAGNOSIS — F17200 Nicotine dependence, unspecified, uncomplicated: Secondary | ICD-10-CM

## 2011-06-07 DIAGNOSIS — K219 Gastro-esophageal reflux disease without esophagitis: Secondary | ICD-10-CM

## 2011-06-07 DIAGNOSIS — F411 Generalized anxiety disorder: Secondary | ICD-10-CM

## 2011-06-07 LAB — BASIC METABOLIC PANEL
BUN: 12 mg/dL (ref 6–23)
CO2: 30 mEq/L (ref 19–32)
Calcium: 9.3 mg/dL (ref 8.4–10.5)
Chloride: 101 mEq/L (ref 96–112)
Creatinine, Ser: 1 mg/dL (ref 0.4–1.2)
GFR: 58.93 mL/min — ABNORMAL LOW (ref >60.00)
Glucose, Bld: 107 mg/dL — ABNORMAL HIGH (ref 70–99)
Potassium: 4.6 mEq/L (ref 3.5–5.1)
Sodium: 139 mEq/L (ref 135–145)

## 2011-06-07 LAB — LIPID PANEL
Cholesterol: 138 mg/dL (ref 0–200)
HDL: 39.7 mg/dL (ref >39.00)
LDL Cholesterol: 75 mg/dL (ref 0–99)
Total CHOL/HDL Ratio: 3
Triglycerides: 119 mg/dL (ref 0.0–149.0)
VLDL: 23.8 mg/dL (ref 0.0–40.0)

## 2011-06-07 LAB — CBC WITH DIFFERENTIAL/PLATELET
Basophils Absolute: 0 10*3/uL (ref 0.0–0.1)
Basophils Relative: 0.5 % (ref 0.0–3.0)
Eosinophils Absolute: 0.1 10*3/uL (ref 0.0–0.7)
Eosinophils Relative: 2 % (ref 0.0–5.0)
HCT: 42.8 % (ref 36.0–46.0)
Hemoglobin: 14.4 g/dL (ref 12.0–15.0)
Lymphocytes Relative: 24.5 % (ref 12.0–46.0)
Lymphs Abs: 1.6 10*3/uL (ref 0.7–4.0)
MCHC: 33.7 g/dL (ref 30.0–36.0)
MCV: 92.2 fl (ref 78.0–100.0)
Monocytes Absolute: 0.4 10*3/uL (ref 0.1–1.0)
Monocytes Relative: 5.6 % (ref 3.0–12.0)
Neutro Abs: 4.4 10*3/uL (ref 1.4–7.7)
Neutrophils Relative %: 67.4 % (ref 43.0–77.0)
Platelets: 167 10*3/uL (ref 150.0–400.0)
RBC: 4.64 Mil/uL (ref 3.87–5.11)
RDW: 14.1 % (ref 11.5–14.6)
WBC: 6.6 10*3/uL (ref 4.5–10.5)

## 2011-06-07 LAB — HEPATIC FUNCTION PANEL
ALT: 13 U/L (ref 0–35)
AST: 12 U/L (ref 0–37)
Albumin: 3.7 g/dL (ref 3.5–5.2)
Alkaline Phosphatase: 69 U/L (ref 39–117)
Bilirubin, Direct: 0 mg/dL (ref 0.0–0.3)
Total Bilirubin: 0.6 mg/dL (ref 0.3–1.2)
Total Protein: 6.7 g/dL (ref 6.0–8.3)

## 2011-06-07 LAB — TSH: TSH: 1.22 u[IU]/mL (ref 0.35–5.50)

## 2011-06-07 MED ORDER — LISINOPRIL 5 MG PO TABS: 5.0000 mg | ORAL_TABLET | Freq: Every day | ORAL | Status: DC

## 2011-06-07 MED ORDER — SERTRALINE HCL 50 MG PO TABS: 50.0000 mg | ORAL_TABLET | Freq: Every day | ORAL | Status: DC

## 2011-06-07 NOTE — Assessment & Plan Note (Signed)
She understands need to quit She is not motivated at this time

## 2011-06-07 NOTE — Progress Notes (Signed)
Patient ID: [name], female   DOB: 1960-08-22, 50 y.o.   MRN: [id number] Note weight loss Patient Active Problem List  Diagnoses  . HYPERLIPIDEMIA--tolerating lipitor  . TOBACCO USE-has not quit  .   Marland Kitchen HYPERTENSION---no sxs on meds  . GERD---not having any sxs on PPI   She admits to anxiety---lots of stress at work and at home. Just feels nervous at times. She likes to be alone.  Past Medical History  Diagnosis Date  . Arthritis   . Smoker   . Chronic leg pain   . Hypertension   . Hyperlipidemia     History   Social History  . Marital Status: Married    Spouse Name: N/A    Number of Children: N/A  . Years of Education: N/A   Occupational History  . Not on file.   Social History Main Topics  . Smoking status: Current Everyday Smoker -- 1.0 packs/day for 37 years    Types: Cigarettes  . Smokeless tobacco: Never Used  . Alcohol Use: No  . Drug Use: No  . Sexually Active: No   Other Topics Concern  . Not on file   Social History Narrative  . No narrative on file    Past Surgical History  Procedure Date  . Partial hysterectomy   . Back surgery 1989, 1990, 2006  . Cholecystectomy 1998  . Leg surgery 2009    work related injury  . Breast lumpectomy 1976    bi-lat , both benign     Family History  Problem Relation Age of Onset  . Diabetes Mother   . Hypertension Mother   . Heart disease Mother   . Hypertension Father   . Diabetes Father   . Heart disease Father     Allergies  Allergen Reactions  . Codeine Phosphate     REACTION: unspecified  . Simvastatin     REACTION: muscle \\T \ joint pains  . Sulfamethoxazole     REACTION: unspecified    Current Outpatient Prescriptions on File Prior to Visit  Medication Sig Dispense Refill  . ALPRAZolam (XANAX) 0.25 MG tablet Take 1 tablet (0.25 mg total) by mouth daily as needed for anxiety.  10 tablet  0  . atorvastatin (LIPITOR) 80 MG tablet TAKE 1 TABLET DAILY OR AS DIRECTED  90 tablet  2  .  estradiol (ESTRACE) 2 MG tablet Take 1 tablet (2 mg total) by mouth daily.  90 tablet  3  . HYDROcodone-acetaminophen (NORCO) 10-325 MG per tablet Take 1 tablet by mouth every 6 (six) hours as needed.        Marland Kitchen lisinopril (PRINIVIL,ZESTRIL) 20 MG tablet Take 1 tablet (20 mg total) by mouth daily.  90 tablet  0  . omeprazole (PRILOSEC) 10 MG capsule Take 10 mg by mouth daily.        Marland Kitchen oxyCODONE (OXYCONTIN) 40 MG 12 hr tablet Take 40 mg by mouth 3 (three) times daily.        . pregabalin (LYRICA) 75 MG capsule Take 75 mg by mouth 3 (three) times daily.        . Vitamin D, Ergocalciferol, (DRISDOL) 50000 UNITS CAPS Take 1 capsule (50,000 Units total) by mouth every 7 (seven) days.  30 capsule  0     patient denies chest pain, shortness of breath, orthopnea. Denies lower extremity edema, abdominal pain, change in appetite, change in bowel movements. Patient denies rashes, musculoskeletal complaints. No other specific complaints in a complete review of systems.  BP 102/68  Pulse 72  Temp(Src) 98 F (36.7 C) (Oral)  Wt 175 lb (79.379 kg)  LMP 04/22/1997  Well-developed well-nourished female in no acute distress. HEENT exam atraumatic, normocephalic, extraocular muscles are intact. Neck is supple. No jugular venous distention no thyromegaly. Chest clear to auscultation without increased work of breathing. Cardiac exam S1 and S2 are regular. Abdominal exam active bowel sounds, soft, nontender. Extremities no edema.

## 2011-06-07 NOTE — Assessment & Plan Note (Signed)
Needs labs Check today 

## 2011-06-07 NOTE — Assessment & Plan Note (Signed)
Note bp is low--likely needs less meds Decrease lisinopril She is only taking 1/2 of 20 mg at this time Will stop lisinopril

## 2011-06-10 ENCOUNTER — Telehealth: Payer: Self-pay | Admitting: Internal Medicine

## 2011-06-10 NOTE — Telephone Encounter (Signed)
Labs mailed, pt aware

## 2011-06-10 NOTE — Telephone Encounter (Signed)
Pt called req to get a copy of her labs, mailed to home address. Notify pt when this has been done. Pls also include her bp reading.

## 2011-06-11 ENCOUNTER — Encounter: Payer: Self-pay | Admitting: Gynecology

## 2011-06-11 ENCOUNTER — Ambulatory Visit (INDEPENDENT_AMBULATORY_CARE_PROVIDER_SITE_OTHER): Payer: BC Managed Care – PPO | Admitting: Gynecology

## 2011-06-11 VITALS — BP 128/80

## 2011-06-11 DIAGNOSIS — R8789 Other abnormal findings in specimens from female genital organs: Secondary | ICD-10-CM

## 2011-06-11 DIAGNOSIS — N882 Stricture and stenosis of cervix uteri: Secondary | ICD-10-CM

## 2011-06-11 DIAGNOSIS — Z79890 Hormone replacement therapy (postmenopausal): Secondary | ICD-10-CM

## 2011-06-11 MED ORDER — PROGESTERONE MICRONIZED 100 MG PO CAPS: 100.0000 mg | ORAL_CAPSULE | Freq: Every day | ORAL | Status: DC

## 2011-06-11 NOTE — Patient Instructions (Signed)
Follow up for biopsy results. Start progesterone daily as we discussed.

## 2011-06-11 NOTE — Progress Notes (Addendum)
Patient presents in referral from [place] due to benign-appearing glandular cells on Pap smear. The Pap smear was sent in as a vaginal Pap smear. She does have a history of a supracervical hysterectomy in the past. She also is on ERT of estradiol for menopausal symptoms which are unacceptable to her.    Exam with Elane Fritz chaperone present Pelvic: External BUS vagina normal. Cervix normal. Bimanual without masses or tenderness small cervical stump noted. Adnexa without masses or tenderness  Colposcopy with acetic acid cleansed adequate with endocervical speculum. No abnormalities seen. A paracervical block was placed 1% lidocaine total of 8 cc. Anterior lip of the cervix was grasped with a single-tooth tenaculum and the cervical canal probed was noted to be relatively short. A vigorous ECC performed.  Assessment and plan: I suspect the glandular cells are normal endocervical cells and that the lab was unaware that she had a supracervical hysterectomy and that was a pure vaginal smear. She has been on estrogen replacement unopposed. I reviewed with her with supracervical hysterectomies it may be prudent to add progesterone for endometrial protection in case there are lower uterine segment endometrial cells present. She has done no bleeding of the last several years on estrogen. She will follow up for her biopsy results, assuming negative I have suggested we start progesterone regimen. Her is options to include intermittent monthly or every other month withdrawal versus daily were reviewed and she prefer a daily dose.  I prescribed Prometrium 100 mg nightly. I reviewed HRT in general to include the WHI study. Increased risk of stroke heart attack DVT as well as increased risk of breast cancer reviewed. Lowest dose for shortest period of time ACOG and NAMS recommendations were reviewed and she understands all this accepts this and wants to continue on HRT.

## 2011-06-14 ENCOUNTER — Ambulatory Visit: Payer: BC Managed Care – PPO | Admitting: Gynecology

## 2011-06-14 ENCOUNTER — Telehealth: Payer: Self-pay | Admitting: *Deleted

## 2011-06-14 NOTE — Telephone Encounter (Signed)
Pt informed with the below note, she will make appointment with GI for colonoscopy.

## 2011-06-14 NOTE — Telephone Encounter (Signed)
Message copied by Aura Camps on Mon Jun 14, 2011  8:43 AM ------      Message from: Dara Lords      Created: Fri Jun 11, 2011  5:25 PM       Call patient and tell her that we never received her guaiac card in that she is welcome to come by and let us redo this or follow up with gastroenterology to arrange colonoscopy screening regardless. Document the patient was contacted with this message.      ----- Message -----         From: Erik Obey         Sent: 06/11/2011   3:53 PM           To: Dara Lords, MD            I called cytology office and they will amend the report and send to [place]. I also checked with the lab and they have no record of guaiac card in office. Do want pt to do another card?            ----- Message -----         From: Dara Lords, MD         Sent: 06/11/2011   2:37 PM           To: Erik Obey            Call cytology in reference to the patient's Pap smear. It was listed as a vaginal source when in reality she had a supracervical hysterectomy and has a cervix present. They noted benign glandular cells and I wonder if these were not endocervical cells and that the true reading of the Pap smear would be negative adequate for a supracervical hysterectomy. See if they would amend the report as such.            Also the patient reports mailing in guaiac cards for stool like check and I see no report in the Epic chart. GU check with the lab to see if they receive them and let me know what the results are. If they are in Epic let me know where to find them.

## 2011-06-22 ENCOUNTER — Telehealth: Payer: Self-pay | Admitting: Internal Medicine

## 2011-06-22 NOTE — Telephone Encounter (Signed)
Pt called and said that she can not tolerate zoloft.   Pt has sinus inf and is req to get med to clear it up. Pt has tried Careers adviser and Claritin but nothing has helped. [facility] [address].

## 2011-06-23 NOTE — Telephone Encounter (Signed)
D/c zoloft (sertraline). Try paroxetine 20 mg po qd

## 2011-06-23 NOTE — Telephone Encounter (Signed)
L/m on pts cell phone that she will need to be seen by another provider for the sinus infection.  Do you want to change the zoloft?

## 2011-06-23 NOTE — Telephone Encounter (Signed)
Pt decline ov. Pt will go to urgent care if needed

## 2011-06-24 MED ORDER — PAROXETINE HCL 20 MG PO TABS: 20.0000 mg | ORAL_TABLET | ORAL | Status: DC

## 2011-06-24 NOTE — Telephone Encounter (Signed)
Pt aware, rx sent in electronically 

## 2011-10-08 ENCOUNTER — Encounter: Payer: BC Managed Care – PPO | Admitting: Gastroenterology

## 2011-10-12 ENCOUNTER — Telehealth: Payer: Self-pay | Admitting: Internal Medicine

## 2011-10-12 MED ORDER — PANTOPRAZOLE SODIUM 40 MG PO TBEC: 40.0000 mg | DELAYED_RELEASE_TABLET | Freq: Every day | ORAL | Status: DC

## 2011-10-12 NOTE — Telephone Encounter (Signed)
Ok to switch to Protonix 40 mg once daily x5

## 2011-10-12 NOTE — Telephone Encounter (Signed)
Patient called stating that she is taking the over the counter prilosec and would like to know if she can have an rx for protonix call in instead. Please advise.

## 2011-10-27 ENCOUNTER — Encounter: Payer: Self-pay | Admitting: Internal Medicine

## 2011-12-09 ENCOUNTER — Other Ambulatory Visit: Payer: Self-pay | Admitting: Women's Health

## 2011-12-09 ENCOUNTER — Telehealth: Payer: Self-pay | Admitting: *Deleted

## 2011-12-09 DIAGNOSIS — N390 Urinary tract infection, site not specified: Secondary | ICD-10-CM

## 2011-12-09 MED ORDER — CIPROFLOXACIN HCL 500 MG PO TABS: 500.0000 mg | ORAL_TABLET | Freq: Two times a day (BID) | ORAL | Status: DC

## 2011-12-09 NOTE — Telephone Encounter (Signed)
Pt calling c/o UTI s/s burning with urination, lower back pressure x 4 days now, she works 12 hour shift and said she can't make OV due to this. Pt would like rx for UTI. Please advise

## 2011-12-09 NOTE — Telephone Encounter (Signed)
Telephone call to review request. States has had problems with UTIs that would become severe and would be treated at the hospital. States has increased frequency, urgency, pain and burning with urination and a low backache. Denies a fever or vaginal discharge. History of a hysterectomy. Allergic to sulfa. Will call and Cipro 500 twice a day for 3 days. We'll check a test of cure urine in 2 weeks. States unable to have an office visit today or tomorrow due to work.

## 2012-02-10 ENCOUNTER — Other Ambulatory Visit: Payer: Self-pay | Admitting: *Deleted

## 2012-02-10 MED ORDER — ATORVASTATIN CALCIUM 80 MG PO TABS: 80.0000 mg | ORAL_TABLET | Freq: Every day | ORAL | Status: DC

## 2012-04-03 ENCOUNTER — Encounter (HOSPITAL_COMMUNITY): Payer: Self-pay | Admitting: *Deleted

## 2012-04-03 ENCOUNTER — Emergency Department (HOSPITAL_COMMUNITY)
Admission: EM | Admit: 2012-04-03 | Discharge: 2012-04-03 | Disposition: A | Discharge status: Home / Self Care | Payer: BC Managed Care – PPO | Attending: Emergency Medicine | Admitting: Emergency Medicine

## 2012-04-03 DIAGNOSIS — R0602 Shortness of breath: Secondary | ICD-10-CM | POA: Insufficient documentation

## 2012-04-03 DIAGNOSIS — F17200 Nicotine dependence, unspecified, uncomplicated: Secondary | ICD-10-CM | POA: Insufficient documentation

## 2012-04-03 DIAGNOSIS — K2100 Gastro-esophageal reflux disease with esophagitis, without bleeding: Secondary | ICD-10-CM | POA: Insufficient documentation

## 2012-04-03 DIAGNOSIS — R0989 Other specified symptoms and signs involving the circulatory and respiratory systems: Secondary | ICD-10-CM | POA: Insufficient documentation

## 2012-04-03 DIAGNOSIS — R29818 Other symptoms and signs involving the nervous system: Secondary | ICD-10-CM | POA: Insufficient documentation

## 2012-04-03 DIAGNOSIS — G8929 Other chronic pain: Secondary | ICD-10-CM | POA: Insufficient documentation

## 2012-04-03 DIAGNOSIS — E785 Hyperlipidemia, unspecified: Secondary | ICD-10-CM | POA: Insufficient documentation

## 2012-04-03 DIAGNOSIS — I10 Essential (primary) hypertension: Secondary | ICD-10-CM | POA: Insufficient documentation

## 2012-04-03 DIAGNOSIS — M79609 Pain in unspecified limb: Secondary | ICD-10-CM | POA: Insufficient documentation

## 2012-04-03 DIAGNOSIS — M7918 Myalgia, other site: Secondary | ICD-10-CM

## 2012-04-03 DIAGNOSIS — M129 Arthropathy, unspecified: Secondary | ICD-10-CM | POA: Insufficient documentation

## 2012-04-03 LAB — POCT I-STAT TROPONIN I: Troponin i, poc: 0 ng/mL (ref 0.00–0.08)

## 2012-04-03 LAB — PRO B NATRIURETIC PEPTIDE: Pro B Natriuretic peptide (BNP): 49.1 pg/mL (ref 0–125)

## 2012-04-03 MED ORDER — ESOMEPRAZOLE MAGNESIUM 40 MG PO PACK: 40.0000 mg | PACK | Freq: Every day | ORAL | Status: DC

## 2012-04-03 NOTE — ED Notes (Signed)
Pt c/o dull, burning, 7/10 intermittent cp radiating from center of chest to left upper arm and circling from left rib cage to lower breast lasting 10-15 mins. Pt states that last Saturday she experienced dull, burning, 10/10 cp directly in center of chest after eating lasted an hour. Pt A&Ox4, ambulates independently. Currently no active cp. Pt denies sob, weakness.

## 2012-04-03 NOTE — ED Provider Notes (Signed)
I saw and evaluated the patient, reviewed the resident's note and I agree with the findings and plan. Pt w cp earlier, hx gerd. Chest cta.   Suzi Roots, MD 04/03/12 [phone]

## 2012-04-03 NOTE — ED Notes (Signed)
Acuity changed as pt tells me that she struggles with constant acid reflux and thinks this may re related to the acid.  Labs still entered

## 2012-04-03 NOTE — ED Notes (Signed)
Called for pt, no answer

## 2012-04-03 NOTE — ED Notes (Signed)
Pt began having left chest pain a hour ago.  Pt denies any n/v or diaphoresis with this.  Pt has had sob with this also.

## 2012-04-03 NOTE — ED Provider Notes (Signed)
History     CSN: [id number]  Arrival date & time 04/03/12  1420   First MD Initiated Contact with Patient 04/03/12 1557      Chief Complaint  Patient presents with  . Chest Pain    (Consider location/radiation/quality/duration/timing/severity/associated sxs/prior treatment) HPI CC: Chest pain  Chest pain: Started on Saturday after eating some potato chips. Pain was burning in nature and lasted approximately 1hr. Improves after tums and prilosec. Pain is located mid sternal w/ radiation up her chest and a small amount under her L breast. Pain occurred a few times on Sunday and was not associated w/ meals. Went to pain clinic today and still w/ pain, and was told to come to hospital. Pt w/ significant h/o "heart burn for many years." Denies palpitations, syncope, diaphoresis, dizziness, HA.   Continues to smoke 1ppd. Down from 2ppd  Past Medical History  Diagnosis Date  . Arthritis   . Smoker   . Chronic leg pain   . Hypertension   . Hyperlipidemia     Past Surgical History  Procedure Date  . Partial hysterectomy   . Back surgery 1989, 1990, 2006  . Cholecystectomy 1998  . Leg surgery 2009    work related injury  . Breast lumpectomy 1976    bi-lat , both benign     Family History  Problem Relation Age of Onset  . Diabetes Mother   . Hypertension Mother   . Heart disease Mother   . Hypertension Father   . Diabetes Father   . Heart disease Father     History  Substance Use Topics  . Smoking status: Current Every Day Smoker -- 1.0 packs/day for 37 years    Types: Cigarettes  . Smokeless tobacco: Never Used  . Alcohol Use: No    OB History    Grav Para Term Preterm Abortions TAB SAB Ect Mult Living   3 2   1  1   2       Review of Systems  All other systems reviewed and are negative.    Allergies  Codeine phosphate; Simvastatin; and Sulfamethoxazole  Home Medications   Current Outpatient Rx  Name Route Sig Dispense Refill  . ASPIRIN 81 MG PO  TABS Oral Take 81 mg by mouth daily.    . ATORVASTATIN CALCIUM 80 MG PO TABS Oral Take 40 mg by mouth daily.    . CHLORZOXAZONE 750 MG PO TABS Oral Take 750 mg by mouth at bedtime.    Marland Kitchen VITAMIN D 2000 UNITS PO TABS Oral Take 2,000 Units by mouth daily.    Marland Kitchen ESTRADIOL 2 MG PO TABS Oral Take 2 mg by mouth daily.    Marland Kitchen GARCINIA CAMBOGIA-CHROMIUM PO Oral Take 2 tablets by mouth 2 (two) times daily.    Marland Kitchen LISINOPRIL 5 MG PO TABS Oral Take 5 mg by mouth daily.    Marland Kitchen LORATADINE 10 MG PO TABS Oral Take 10 mg by mouth 2 (two) times daily as needed. For allergies    . OXYCODONE HCL ER 10 MG PO TB12 Oral Take 10 mg by mouth 3 (three) times daily.    . OXYCODONE HCL ER 40 MG PO TB12 Oral Take 40 mg by mouth 3 (three) times daily.      Marland Kitchen PREGABALIN 75 MG PO CAPS Oral Take 75 mg by mouth 3 (three) times daily.       BP 133/71  Pulse 72  Temp 98 F (36.7 C) (Oral)  Resp 14  Ht  5\' 5"  (1.651 m)  Wt 195 lb (88.451 kg)  BMI 32.45 kg/m2  SpO2 95%  LMP 04/22/1997  Physical Exam  Nursing note and vitals reviewed. Constitutional: She is oriented to person, place, and time. She appears well-developed and well-nourished. No distress.  HENT:  Head: Normocephalic and atraumatic.  Right Ear: External ear normal.  Left Ear: External ear normal.  Eyes: Pupils are equal, round, and reactive to light.  Neck: Normal range of motion.  Cardiovascular: Normal rate, regular rhythm and intact distal pulses.        Carotid bruits bilaterally on auscultation.  Pulmonary/Chest: Effort normal and breath sounds normal. No respiratory distress. She has no wheezes. She has no rales. She exhibits tenderness (mild chest discomfort on palpation).  Abdominal: Soft. Normal appearance and bowel sounds are normal. She exhibits no distension.  Musculoskeletal: Normal range of motion.  Neurological: She is alert and oriented to person, place, and time. No cranial nerve deficit.  Skin: Skin is warm and dry. No rash noted.    Psychiatric: She has a normal mood and affect. Her behavior is normal.    ED Course  Procedures (including critical care time)   Labs Reviewed  PRO B NATRIURETIC PEPTIDE  POCT I-STAT TROPONIN I   No results found.   No diagnosis found.    MDM  51yo F w/ PMHx of tobacco use, chronic pain, HTN, GERD, HLD, and anxiety w/ CP that is likely secondary to Reflux Esophagitis and musculoskeletal pain. Troponins, EKG and ProBNP all reasurring that no cardiac involvement. Previous cardiac workups noted in chart that have all been negative. - Stable for DC - Nexium for heart burn - tylenol for pain - Handout given - Pt to f/u w/ PCP regarding possible GI evaluation - Pt to f/u w/ PCP regarding possible carotid bruits.        Ozella Rocks, MD 04/03/12 [phone]

## 2012-04-28 ENCOUNTER — Other Ambulatory Visit: Payer: Self-pay | Admitting: Internal Medicine

## 2012-05-12 ENCOUNTER — Other Ambulatory Visit: Payer: Self-pay | Admitting: Women's Health

## 2012-05-15 NOTE — Telephone Encounter (Signed)
rx called in KW 

## 2012-05-22 ENCOUNTER — Encounter: Payer: Self-pay | Admitting: Women's Health

## 2012-05-22 ENCOUNTER — Ambulatory Visit (INDEPENDENT_AMBULATORY_CARE_PROVIDER_SITE_OTHER): Payer: BC Managed Care – PPO | Admitting: Women's Health

## 2012-05-22 ENCOUNTER — Telehealth: Payer: Self-pay | Admitting: *Deleted

## 2012-05-22 ENCOUNTER — Other Ambulatory Visit (HOSPITAL_COMMUNITY)
Admission: RE | Admit: 2012-05-22 | Discharge: 2012-05-22 | Disposition: A | Discharge status: Home / Self Care | Payer: BC Managed Care – PPO | Source: Ambulatory Visit | Attending: Women's Health | Admitting: Women's Health

## 2012-05-22 VITALS — BP 144/82 | Ht 65.0 in | Wt 197.0 lb

## 2012-05-22 DIAGNOSIS — Z1151 Encounter for screening for human papillomavirus (HPV): Secondary | ICD-10-CM | POA: Insufficient documentation

## 2012-05-22 DIAGNOSIS — R350 Frequency of micturition: Secondary | ICD-10-CM

## 2012-05-22 DIAGNOSIS — Z01419 Encounter for gynecological examination (general) (routine) without abnormal findings: Secondary | ICD-10-CM

## 2012-05-22 DIAGNOSIS — Z780 Asymptomatic menopausal state: Secondary | ICD-10-CM

## 2012-05-22 MED ORDER — ESTRADIOL 2 MG PO TABS: 2.0000 mg | ORAL_TABLET | Freq: Every day | ORAL | Status: DC

## 2012-05-22 MED ORDER — URIBEL 118 MG PO CAPS: 118.0000 mg | ORAL_CAPSULE | Freq: Four times a day (QID) | ORAL | Status: DC

## 2012-05-22 NOTE — Progress Notes (Addendum)
TYLISA ALCIVAR 09/11/1960 [id number]    History:    The patient presents for annual exam. Complaint of urinary frequency, urgency and hesitancy both day and night for years. History of a supracervical  Hysterectomy/dysmenorrhea/back pain in 1998 on estradiol 2 mg with continued hot flushes. Estradiol patch in the past without good relief. History of glandular cells on Pap 2012 with negative biopsy with colposcopy per Dr. Audie Box. Smokes one pack per day.  History of benign breast cysts, breast lumpectomy in 1976. Colonoscopy 2013 with negative polyp. DEXA 2009 normal/T score at spine -0.9. Bilateral hip average 0.1. Primary care manages hypertension, hypercholesterolemia and pain.   Past medical history, past surgical history, family history and social history were all reviewed and documented in the EPIC chart. Had back surgery in 1989 and 2006, chronic back and leg pain.. Works 4p-12,  poor sleep habits tends to sleep 6a to 2 PM. Poor relationship with Victorino Dike age 58. Marchelle Folks 24 doing well.   ROS:  A  ROS was performed and pertinent positives and negatives are included in the history.  Exam:  Filed Vitals:   05/22/12 1006  BP: 144/82    General appearance:  Normal Head/Neck:  Normal, without cervical or supraclavicular adenopathy. Thyroid:  Symmetrical, normal in size, without palpable masses or nodularity. Respiratory  Effort:  Normal  Auscultation:  Clear without wheezing or rhonchi Cardiovascular  Auscultation:  Regular rate, without rubs, murmurs or gallops  Edema/varicosities:  Not grossly evident Abdominal  Soft,nontender, without masses, guarding or rebound.  Liver/spleen:  No organomegaly noted  Hernia:  None appreciated  Skin  Inspection:  Grossly normal  Palpation:  Grossly normal Neurologic/psychiatric  Orientation:  Normal with appropriate conversation.  Mood/affect:  Normal  Genitourinary    Breasts: Examined lying and sitting.     Right: Without masses,  retractions, discharge or axillary adenopathy.     Left: Without masses, retractions, discharge or axillary adenopathy.   Inguinal/mons:  Normal without inguinal adenopathy  External genitalia:  Normal  BUS/Urethra/Skene's glands:  Normal  Bladder:  Normal  Vagina:  Normal  Cervix:  Normal  Uterus:  Absent  Adnexa/parametria:     Rt: Without masses or tenderness.   Lt: Without masses or tenderness.  Anus and perineum: Normal  Digital rectal exam: Normal sphincter tone without palpated masses or tenderness  Assessment/Plan:  51 y.o. MWF G2 P2 for annual exam with urinary frequency complaints.  Urinary frequency, hesitancy, urgency without pain. Continued hot flushes on ERT Hypertension/hypercholesterolemia/chronic pain-primary care Abdominal Obesity Smoker-one pack per day  Plan: Urodynamics discussed. States had relief with uribel, will try again and if symptoms resolve will continue, if not will schedule urodynamics. Uribel 118 mg qid prescription, proper use, given and reviewed. Long discussion on healthier lifestyle, sleep hygiene, weight loss, decrease/quit smoking. Labs and meds at primary care. Estradiol 2 mg prescription, proper use, reviewed risks of blood clots, strokes, breast cancer. Instructed to schedule mammogram which is overdue. Vitamin D 2000 daily encouraged, calcium rich diet, increase exercise and decrease calories for weight loss encouraged.  UA: Pap, reviewed new Pap screening guidelines. DEXA next year.   Harrington Challenger [facility], 12:37 PM 05/22/2012

## 2012-05-22 NOTE — Telephone Encounter (Signed)
Pt was seen today and was told to call if urbiel Rx too expensive at $56.00 per month. Pt would like something cheaper.

## 2012-05-22 NOTE — Telephone Encounter (Signed)
Telephone call, states will check if coupon applicable. If not will try azo over-the-counter.

## 2012-05-23 LAB — URINALYSIS W MICROSCOPIC + REFLEX CULTURE
Bacteria, UA: NONE SEEN
Bilirubin Urine: NEGATIVE
Casts: NONE SEEN
Crystals: NONE SEEN
Glucose, UA: NEGATIVE mg/dL
Hgb urine dipstick: NEGATIVE
Ketones, ur: NEGATIVE mg/dL
Leukocytes, UA: NEGATIVE
Nitrite: NEGATIVE
Protein, ur: NEGATIVE mg/dL
Specific Gravity, Urine: 1.008 (ref 1.005–1.030)
Urobilinogen, UA: 0.2 mg/dL (ref 0.0–1.0)
pH: 7 (ref 5.0–8.0)

## 2012-06-16 ENCOUNTER — Encounter: Payer: Self-pay | Admitting: Women's Health

## 2012-06-21 ENCOUNTER — Other Ambulatory Visit: Payer: Self-pay | Admitting: Internal Medicine

## 2012-07-29 ENCOUNTER — Other Ambulatory Visit: Payer: Self-pay | Admitting: Internal Medicine

## 2012-08-02 ENCOUNTER — Encounter: Payer: Self-pay | Admitting: Internal Medicine

## 2012-08-02 ENCOUNTER — Other Ambulatory Visit: Payer: Self-pay | Admitting: Internal Medicine

## 2012-08-02 ENCOUNTER — Ambulatory Visit (INDEPENDENT_AMBULATORY_CARE_PROVIDER_SITE_OTHER): Payer: BC Managed Care – PPO | Admitting: Internal Medicine

## 2012-08-02 VITALS — BP 124/68 | HR 84 | Temp 97.9°F | Ht 65.0 in | Wt 199.0 lb

## 2012-08-02 DIAGNOSIS — K219 Gastro-esophageal reflux disease without esophagitis: Secondary | ICD-10-CM

## 2012-08-02 DIAGNOSIS — G894 Chronic pain syndrome: Secondary | ICD-10-CM

## 2012-08-02 DIAGNOSIS — E785 Hyperlipidemia, unspecified: Secondary | ICD-10-CM

## 2012-08-02 DIAGNOSIS — F17200 Nicotine dependence, unspecified, uncomplicated: Secondary | ICD-10-CM

## 2012-08-02 DIAGNOSIS — E669 Obesity, unspecified: Secondary | ICD-10-CM

## 2012-08-02 DIAGNOSIS — I10 Essential (primary) hypertension: Secondary | ICD-10-CM

## 2012-08-02 LAB — CBC WITH DIFFERENTIAL/PLATELET
Basophils Absolute: 0 10*3/uL (ref 0.0–0.1)
Basophils Relative: 0.5 % (ref 0.0–3.0)
Eosinophils Absolute: 0.1 10*3/uL (ref 0.0–0.7)
Eosinophils Relative: 2.4 % (ref 0.0–5.0)
HCT: 40 % (ref 36.0–46.0)
Hemoglobin: 13.1 g/dL (ref 12.0–15.0)
Lymphocytes Relative: 23.8 % (ref 12.0–46.0)
Lymphs Abs: 1.5 10*3/uL (ref 0.7–4.0)
MCHC: 32.7 g/dL (ref 30.0–36.0)
MCV: 90.3 fl (ref 78.0–100.0)
Monocytes Absolute: 0.4 10*3/uL (ref 0.1–1.0)
Monocytes Relative: 6.1 % (ref 3.0–12.0)
Neutro Abs: 4.2 10*3/uL (ref 1.4–7.7)
Neutrophils Relative %: 67.2 % (ref 43.0–77.0)
Platelets: 174 10*3/uL (ref 150.0–400.0)
RBC: 4.43 Mil/uL (ref 3.87–5.11)
RDW: 15.3 % — ABNORMAL HIGH (ref 11.5–14.6)
WBC: 6.2 10*3/uL (ref 4.5–10.5)

## 2012-08-02 LAB — BASIC METABOLIC PANEL
BUN: 10 mg/dL (ref 6–23)
CO2: 29 mEq/L (ref 19–32)
Calcium: 8.7 mg/dL (ref 8.4–10.5)
Chloride: 103 mEq/L (ref 96–112)
Creatinine, Ser: 1 mg/dL (ref 0.4–1.2)
GFR: 60 mL/min — ABNORMAL LOW (ref >60.00)
Glucose, Bld: 142 mg/dL — ABNORMAL HIGH (ref 70–99)
Potassium: 4.1 mEq/L (ref 3.5–5.1)
Sodium: 137 mEq/L (ref 135–145)

## 2012-08-02 LAB — HEPATIC FUNCTION PANEL
ALT: 16 U/L (ref 0–35)
AST: 17 U/L (ref 0–37)
Albumin: 3.3 g/dL — ABNORMAL LOW (ref 3.5–5.2)
Alkaline Phosphatase: 62 U/L (ref 39–117)
Bilirubin, Direct: 0 mg/dL (ref 0.0–0.3)
Total Bilirubin: 0.4 mg/dL (ref 0.3–1.2)
Total Protein: 6.1 g/dL (ref 6.0–8.3)

## 2012-08-02 LAB — LIPID PANEL
Cholesterol: 136 mg/dL (ref 0–200)
HDL: 37.1 mg/dL — ABNORMAL LOW (ref >39.00)
LDL Cholesterol: 67 mg/dL (ref 0–99)
Total CHOL/HDL Ratio: 4
Triglycerides: 159 mg/dL — ABNORMAL HIGH (ref 0.0–149.0)
VLDL: 31.8 mg/dL (ref 0.0–40.0)

## 2012-08-02 LAB — TSH: TSH: 3.26 u[IU]/mL (ref 0.35–5.50)

## 2012-08-02 MED ORDER — ESOMEPRAZOLE MAGNESIUM 40 MG PO CPDR: 40.0000 mg | DELAYED_RELEASE_CAPSULE | Freq: Every day | ORAL | Status: DC

## 2012-08-02 MED ORDER — VARENICLINE TARTRATE 0.5 MG X 11 & 1 MG X 42 PO MISC: ORAL | Status: AC

## 2012-08-02 MED ORDER — VARENICLINE TARTRATE 1 MG PO TABS
1.0000 mg | ORAL_TABLET | Freq: Two times a day (BID) | ORAL | Status: AC
Start: 2012-08-30 — End: 2012-11-30

## 2012-08-02 NOTE — Assessment & Plan Note (Signed)
Well-controlled on current meds 

## 2012-08-02 NOTE — Progress Notes (Signed)
Patient ID: [name], female   DOB: 1961/01/23, 52 y.o.   MRN: [id number] htn- tolerating meds  GERD- doing well on meds  Chronic back pain-- doing ok on lyrica  TOBACCO abuse- she is interested in quitting  Past Medical History  Diagnosis Date  . Arthritis   . Smoker   . Chronic leg pain   . Hypertension   . Hyperlipidemia     History   Social History  . Marital Status: Married    Spouse Name: N/A    Number of Children: N/A  . Years of Education: N/A   Occupational History  . Not on file.   Social History Main Topics  . Smoking status: Current Every Day Smoker -- 1.00 packs/day for 37 years    Types: Cigarettes  . Smokeless tobacco: Never Used  . Alcohol Use: No  . Drug Use: No  . Sexually Active: No   Other Topics Concern  . Not on file   Social History Narrative  . No narrative on file    Past Surgical History  Procedure Laterality Date  . Partial hysterectomy    . Back surgery  1989, 1990, 2006  . Cholecystectomy  1998  . Leg surgery  2009    work related injury  . Breast lumpectomy  1976    bi-lat , both benign     Family History  Problem Relation Age of Onset  . Diabetes Mother   . Hypertension Mother   . Heart disease Mother   . Hypertension Father   . Diabetes Father   . Heart disease Father     Allergies  Allergen Reactions  . Codeine Phosphate     REACTION: unspecified  . Simvastatin     REACTION: muscle \\T \ joint pains  . Sulfamethoxazole     REACTION: unspecified    Current Outpatient Prescriptions on File Prior to Visit  Medication Sig Dispense Refill  . aspirin 81 MG tablet Take 81 mg by mouth daily.      Marland Kitchen atorvastatin (LIPITOR) 80 MG tablet Take 40 mg by mouth daily.      . Chlorzoxazone (LORZONE) 750 MG TABS Take 750 mg by mouth at bedtime.      . Cholecalciferol (VITAMIN D) 2000 UNITS tablet Take 2,000 Units by mouth daily.      Marland Kitchen esomeprazole (NEXIUM) 40 MG packet Take 40 mg by mouth daily before breakfast.  14  each  0  . estradiol (ESTRACE) 2 MG tablet Take 1 tablet (2 mg total) by mouth daily.  90 tablet  4  . GARCINIA CAMBOGIA-CHROMIUM PO Take 2 tablets by mouth 2 (two) times daily.      Marland Kitchen lisinopril (PRINIVIL,ZESTRIL) 5 MG tablet Take 5 mg by mouth daily.      Marland Kitchen lisinopril (PRINIVIL,ZESTRIL) 5 MG tablet TAKE 1 TABLET (5 MG TOTAL) BY MOUTH DAILY.  30 tablet  0  . loratadine (CLARITIN) 10 MG tablet Take 10 mg by mouth 2 (two) times daily as needed. For allergies      . Meth-Hyo-M Bl-Na Phos-Ph Sal (URIBEL) 118 MG CAPS Take 1 capsule (118 mg total) by mouth 4 (four) times daily.  120 capsule  12  . oxyCODONE (OXYCONTIN) 10 MG 12 hr tablet Take 10 mg by mouth 3 (three) times daily.      Marland Kitchen oxyCODONE (OXYCONTIN) 40 MG 12 hr tablet Take 40 mg by mouth 3 (three) times daily.        . pantoprazole (PROTONIX) 40 MG  tablet TAKE 1 TABLET (40 MG TOTAL) BY MOUTH DAILY.  30 tablet  5  . pregabalin (LYRICA) 75 MG capsule Take 75 mg by mouth 3 (three) times daily.        No current facility-administered medications on file prior to visit.     patient denies chest pain, shortness of breath, orthopnea. Denies lower extremity edema, abdominal pain, change in appetite, change in bowel movements. Patient denies rashes, musculoskeletal complaints. No other specific complaints in a complete review of systems.   LMP 04/22/1997  Well-developed well-nourished female in no acute distress. HEENT exam atraumatic, normocephalic, extraocular muscles are intact. Neck is supple. No jugular venous distention no thyromegaly. Chest clear to auscultation without increased work of breathing. Cardiac exam S1 and S2 are regular. Abdominal exam active bowel sounds, soft, nontender. Extremities no edema. Neurologic exam she is alert without any motor sensory deficits. Gait is normal.

## 2012-08-02 NOTE — Addendum Note (Signed)
Addended by: Lindley Magnus on: 08/02/2012 09:15 AM   Modules accepted: Orders

## 2012-08-02 NOTE — Assessment & Plan Note (Addendum)
She is interested in quitting- Discussed chantix- she is accepting all risks

## 2012-08-02 NOTE — Assessment & Plan Note (Signed)
Needs lab work She has gained weight-- discussed need for low calorie diet, daily exercise

## 2012-08-02 NOTE — Assessment & Plan Note (Signed)
Continue same meds

## 2012-08-02 NOTE — Assessment & Plan Note (Signed)
Has f/u regularly with pain clinic

## 2012-08-03 ENCOUNTER — Ambulatory Visit: Payer: BC Managed Care – PPO

## 2012-08-03 DIAGNOSIS — E119 Type 2 diabetes mellitus without complications: Secondary | ICD-10-CM

## 2012-08-03 LAB — HEMOGLOBIN A1C: Hgb A1c MFr Bld: 6.2 % (ref 4.6–6.5)

## 2012-08-16 ENCOUNTER — Telehealth: Payer: Self-pay | Admitting: Internal Medicine

## 2012-08-16 NOTE — Telephone Encounter (Signed)
Pt had lab work drawn on 2-19 requesting results

## 2012-08-17 NOTE — Telephone Encounter (Signed)
Gave pt normal lab results and mailed copy to pt

## 2012-08-28 ENCOUNTER — Other Ambulatory Visit: Payer: Self-pay | Admitting: Internal Medicine

## 2012-09-25 ENCOUNTER — Other Ambulatory Visit: Payer: Self-pay | Admitting: Internal Medicine

## 2012-10-29 ENCOUNTER — Other Ambulatory Visit: Payer: Self-pay | Admitting: Internal Medicine

## 2012-11-24 ENCOUNTER — Other Ambulatory Visit: Payer: Self-pay | Admitting: Internal Medicine

## 2012-12-31 ENCOUNTER — Other Ambulatory Visit: Payer: Self-pay | Admitting: Internal Medicine

## 2013-01-20 ENCOUNTER — Other Ambulatory Visit: Payer: Self-pay | Admitting: Internal Medicine

## 2013-04-08 ENCOUNTER — Other Ambulatory Visit: Payer: Self-pay | Admitting: Internal Medicine

## 2013-05-06 ENCOUNTER — Other Ambulatory Visit: Payer: Self-pay | Admitting: Internal Medicine

## 2013-06-02 ENCOUNTER — Other Ambulatory Visit: Payer: Self-pay | Admitting: Internal Medicine

## 2013-06-12 ENCOUNTER — Other Ambulatory Visit: Payer: Self-pay | Admitting: Internal Medicine

## 2013-06-18 ENCOUNTER — Telehealth: Payer: Self-pay | Admitting: Internal Medicine

## 2013-06-18 NOTE — Telephone Encounter (Signed)
Patient called stating she was told by pharmacy that she need to schedule an appointment but she didn't  Know why, pt stated she is almost out of her medication and cant wait to schedule an appointment

## 2013-06-18 NOTE — Telephone Encounter (Signed)
Pt has not been seen since 08/02/12.  Needs to make an appt.  We can give her enough refills to get her through to appt

## 2013-06-19 ENCOUNTER — Other Ambulatory Visit: Payer: Self-pay | Admitting: Internal Medicine

## 2013-06-19 MED ORDER — VARENICLINE TARTRATE 0.5 MG X 11 & 1 MG X 42 PO MISC: ORAL | Status: DC

## 2013-06-19 MED ORDER — ATORVASTATIN CALCIUM 80 MG PO TABS: 40.0000 mg | ORAL_TABLET | Freq: Every day | ORAL | Status: DC

## 2013-06-19 MED ORDER — LISINOPRIL 5 MG PO TABS: ORAL_TABLET | ORAL | Status: DC

## 2013-06-19 NOTE — Telephone Encounter (Signed)
rx sent in electronically 

## 2013-06-19 NOTE — Addendum Note (Signed)
Addended by: Townsend Roger D on: 06/19/2013 02:50 PM   Modules accepted: Orders

## 2013-06-19 NOTE — Telephone Encounter (Signed)
Pt has scheduled appt for 2/23. Can you call in  varenicline (CHANTIX ) 1 MG tablet lisinopril (PRINIVIL,ZESTRIL) 5 MG tablet Pharm: [facility] [address] Send the  To  atorvastatin (LIPITOR) 80 MG tablet to [facility]

## 2013-07-06 ENCOUNTER — Telehealth: Payer: Self-pay | Admitting: Internal Medicine

## 2013-07-06 MED ORDER — ESOMEPRAZOLE MAGNESIUM 40 MG PO CPDR: DELAYED_RELEASE_CAPSULE | ORAL | Status: DC

## 2013-07-06 NOTE — Telephone Encounter (Signed)
Pt needs new rx nexium 40 mg #90 w/[facility]

## 2013-07-06 NOTE — Telephone Encounter (Signed)
appt on 08/06/13, rx sent in electronically

## 2013-08-01 ENCOUNTER — Other Ambulatory Visit: Payer: Self-pay

## 2013-08-01 DIAGNOSIS — Z780 Asymptomatic menopausal state: Secondary | ICD-10-CM

## 2013-08-01 MED ORDER — ESTRADIOL 2 MG PO TABS: 2.0000 mg | ORAL_TABLET | Freq: Every day | ORAL | Status: DC

## 2013-08-05 NOTE — Progress Notes (Signed)
Patient ID: [name], female   DOB: 01-12-1961, 53 y.o.   MRN: [id number] htn- tolerating meds  GERD- doing well on meds  Chronic back pain-- doing ok on lyrica  TOBACCO abuse- she is interested in quitting  Past Medical History  Diagnosis Date  . Arthritis   . Smoker   . Chronic leg pain   . Hypertension   . Hyperlipidemia     History   Social History  . Marital Status: Married    Spouse Name: N/A    Number of Children: N/A  . Years of Education: N/A   Occupational History  . Not on file.   Social History Main Topics  . Smoking status: Current Every Day Smoker -- 1.00 packs/day for 37 years    Types: Cigarettes  . Smokeless tobacco: Never Used  . Alcohol Use: No  . Drug Use: No  . Sexual Activity: No   Other Topics Concern  . Not on file   Social History Narrative  . No narrative on file    Past Surgical History  Procedure Laterality Date  . Partial hysterectomy    . Back surgery  1989, 1990, 2006  . Cholecystectomy  1998  . Leg surgery  2009    work related injury  . Breast lumpectomy  1976    bi-lat , both benign     Family History  Problem Relation Age of Onset  . Diabetes Mother   . Hypertension Mother   . Heart disease Mother   . Hypertension Father   . Diabetes Father   . Heart disease Father     Allergies  Allergen Reactions  . Codeine Phosphate     REACTION: unspecified  . Simvastatin     REACTION: muscle \\T \ joint pains  . Sulfamethoxazole     REACTION: unspecified    Current Outpatient Prescriptions on File Prior to Visit  Medication Sig Dispense Refill  . aspirin 81 MG tablet Take 81 mg by mouth daily.      Marland Kitchen atorvastatin (LIPITOR) 80 MG tablet Take 0.5 tablets (40 mg total) by mouth daily.  30 tablet  1  . Cholecalciferol (VITAMIN D) 2000 UNITS tablet Take 2,000 Units by mouth daily.      Marland Kitchen esomeprazole (NEXIUM) 40 MG capsule TAKE 1 CAPSULE (40 MG TOTAL) BY MOUTH DAILY.  90 capsule  0  . estradiol (ESTRACE) 2 MG tablet  Take 1 tablet (2 mg total) by mouth daily.  90 tablet  0  . lisinopril (PRINIVIL,ZESTRIL) 5 MG tablet TAKE 1 TABLET BY MOUTH EVERY DAY  30 tablet  1  . loratadine (CLARITIN) 10 MG tablet Take 10 mg by mouth 2 (two) times daily as needed. For allergies      . Meth-Hyo-M Bl-Na Phos-Ph Sal (URIBEL) 118 MG CAPS Take 1 capsule (118 mg total) by mouth 4 (four) times daily.  120 capsule  12  . oxyCODONE (OXYCONTIN) 10 MG 12 hr tablet Take 10 mg by mouth 3 (three) times daily.      Marland Kitchen oxyCODONE (OXYCONTIN) 40 MG 12 hr tablet Take 40 mg by mouth 3 (three) times daily.        . pregabalin (LYRICA) 75 MG capsule Take 75 mg by mouth 3 (three) times daily.       . varenicline (CHANTIX STARTING MONTH PAK) 0.5 MG X 11 & 1 MG X 42 tablet As directed  53 tablet  0   No current facility-administered medications on file prior to visit.  patient denies chest pain, shortness of breath, orthopnea. Denies lower extremity edema, abdominal pain, change in appetite, change in bowel movements. Patient denies rashes, musculoskeletal complaints. No other specific complaints in a complete review of systems.   LMP 04/22/1997  Well-developed well-nourished female in no acute distress. HEENT exam atraumatic, normocephalic, extraocular muscles are intact. Neck is supple. No jugular venous distention no thyromegaly. Chest clear to auscultation without increased work of breathing. Cardiac exam S1 and S2 are regular. Abdominal exam active bowel sounds, soft, nontender. Extremities no edema. Neurologic exam she is alert without any motor sensory deficits. Gait is normal.

## 2013-08-05 NOTE — Assessment & Plan Note (Signed)
Needs f/u labs 

## 2013-08-05 NOTE — Assessment & Plan Note (Signed)
She understands need to quit smoking 

## 2013-08-05 NOTE — Assessment & Plan Note (Signed)
Fair control Advised aggressive diet and weight loss polan Advised smoking cessation

## 2013-08-06 ENCOUNTER — Ambulatory Visit (INDEPENDENT_AMBULATORY_CARE_PROVIDER_SITE_OTHER): Payer: BC Managed Care – PPO | Admitting: Internal Medicine

## 2013-08-06 ENCOUNTER — Encounter: Payer: Self-pay | Admitting: Internal Medicine

## 2013-08-06 VITALS — BP 138/80 | HR 88 | Temp 97.6°F | Ht 65.0 in | Wt 185.0 lb

## 2013-08-06 DIAGNOSIS — I10 Essential (primary) hypertension: Secondary | ICD-10-CM

## 2013-08-06 DIAGNOSIS — R739 Hyperglycemia, unspecified: Secondary | ICD-10-CM

## 2013-08-06 DIAGNOSIS — R7309 Other abnormal glucose: Secondary | ICD-10-CM

## 2013-08-06 DIAGNOSIS — F17200 Nicotine dependence, unspecified, uncomplicated: Secondary | ICD-10-CM

## 2013-08-06 DIAGNOSIS — E785 Hyperlipidemia, unspecified: Secondary | ICD-10-CM

## 2013-08-06 LAB — LIPID PANEL
Cholesterol: 133 mg/dL (ref 0–200)
HDL: 44.3 mg/dL (ref >39.00)
LDL Cholesterol: 70 mg/dL (ref 0–99)
Total CHOL/HDL Ratio: 3
Triglycerides: 95 mg/dL (ref 0.0–149.0)
VLDL: 19 mg/dL (ref 0.0–40.0)

## 2013-08-06 LAB — HEPATIC FUNCTION PANEL
ALT: 15 U/L (ref 0–35)
AST: 12 U/L (ref 0–37)
Albumin: 3.4 g/dL — ABNORMAL LOW (ref 3.5–5.2)
Alkaline Phosphatase: 59 U/L (ref 39–117)
Bilirubin, Direct: 0 mg/dL (ref 0.0–0.3)
Total Bilirubin: 0.7 mg/dL (ref 0.3–1.2)
Total Protein: 6.2 g/dL (ref 6.0–8.3)

## 2013-08-06 LAB — BASIC METABOLIC PANEL
BUN: 7 mg/dL (ref 6–23)
CO2: 30 mEq/L (ref 19–32)
Calcium: 8.8 mg/dL (ref 8.4–10.5)
Chloride: 103 mEq/L (ref 96–112)
Creatinine, Ser: 0.9 mg/dL (ref 0.4–1.2)
GFR: 71.82 mL/min (ref >60.00)
Glucose, Bld: 86 mg/dL (ref 70–99)
Potassium: 4 mEq/L (ref 3.5–5.1)
Sodium: 139 mEq/L (ref 135–145)

## 2013-08-06 LAB — HEMOGLOBIN A1C: Hgb A1c MFr Bld: 6.2 % (ref 4.6–6.5)

## 2013-08-06 NOTE — Progress Notes (Signed)
Pre visit review using our clinic review tool, if applicable. No additional management support is needed unless otherwise documented below in the visit note. 

## 2013-08-07 ENCOUNTER — Telehealth: Payer: Self-pay | Admitting: Internal Medicine

## 2013-08-07 NOTE — Telephone Encounter (Signed)
Relevant patient education mailed to patient.  

## 2013-08-13 ENCOUNTER — Other Ambulatory Visit: Payer: Self-pay | Admitting: *Deleted

## 2013-08-13 MED ORDER — ATORVASTATIN CALCIUM 80 MG PO TABS: 40.0000 mg | ORAL_TABLET | Freq: Every day | ORAL | Status: DC

## 2013-08-13 MED ORDER — LISINOPRIL 5 MG PO TABS: ORAL_TABLET | ORAL | Status: DC

## 2013-08-17 ENCOUNTER — Ambulatory Visit (INDEPENDENT_AMBULATORY_CARE_PROVIDER_SITE_OTHER): Payer: BC Managed Care – PPO | Admitting: Women's Health

## 2013-08-17 ENCOUNTER — Other Ambulatory Visit (HOSPITAL_COMMUNITY)
Admission: RE | Admit: 2013-08-17 | Discharge: 2013-08-17 | Disposition: A | Discharge status: Home / Self Care | Payer: BC Managed Care – PPO | Source: Ambulatory Visit | Attending: Gynecology | Admitting: Gynecology

## 2013-08-17 ENCOUNTER — Encounter: Payer: Self-pay | Admitting: Women's Health

## 2013-08-17 VITALS — BP 136/82 | Ht 64.0 in | Wt 183.6 lb

## 2013-08-17 DIAGNOSIS — K649 Unspecified hemorrhoids: Secondary | ICD-10-CM

## 2013-08-17 DIAGNOSIS — Z780 Asymptomatic menopausal state: Secondary | ICD-10-CM

## 2013-08-17 DIAGNOSIS — Z01419 Encounter for gynecological examination (general) (routine) without abnormal findings: Secondary | ICD-10-CM

## 2013-08-17 MED ORDER — HYDROCORTISONE ACE-PRAMOXINE 2.5-1 % RE CREA: 3.0000 "application " | TOPICAL_CREAM | Freq: Three times a day (TID) | RECTAL | Status: DC

## 2013-08-17 MED ORDER — ESTRADIOL 2 MG PO TABS: 2.0000 mg | ORAL_TABLET | Freq: Every day | ORAL | Status: DC

## 2013-08-17 NOTE — Addendum Note (Signed)
Addended by: Alen Blew on: 08/17/2013 12:41 PM   Modules accepted: Orders

## 2013-08-17 NOTE — Progress Notes (Signed)
[name] 07-11-1960 [id number]    History:    Presents for annual exam.  Postmenopausal supracervical hysterectomy 1998 for dysmenorrhea and menorrhagia on estradiol 2 mg daily with occasional hot flushes. 2012 glandular cells noted on Pap with negative colposcopy and biopsy. 2013 negative polyp on: For repeat 2016. 2009 DEXA T score -0.9 spine hip average 0.1. Hypertension/ hypercholesteremia/chronic pain management managed by primary care. Normal mammogram history. Has lost 15 pounds in the past year with diet and exercise.   Past medical history, past surgical history, family history and social history were all reviewed and documented in the EPIC chart. Works night shift. Back surgery 1989 & 2006, rod in leg from injury causing chronic pain. 2 daughters [name] 35 estranged/alcohol abuse, [name] 27 doing well.  ROS:  A  ROS was performed and pertinent positives and negatives are included.  Exam:  Filed Vitals:   08/17/13 0956  BP: 136/82    General appearance:  Normal Thyroid:  Symmetrical, normal in size, without palpable masses or nodularity. Respiratory  Auscultation:  Clear without wheezing or rhonchi Cardiovascular  Auscultation:  Regular rate, without rubs, murmurs or gallops  Edema/varicosities:  Not grossly evident Abdominal  Soft,nontender, without masses, guarding or rebound.  Liver/spleen:  No organomegaly noted  Hernia:  None appreciated  Skin  Inspection:  Grossly normal   Breasts: Examined lying and sitting.     Right: Without masses, retractions, discharge or axillary adenopathy.     Left: Without masses, retractions, discharge or axillary adenopathy. Gentitourinary   Inguinal/mons:  Normal without inguinal adenopathy  External genitalia:  Normal  BUS/Urethra/Skene's glands:  Normal  Vagina:  Normal  Cervix:  Normal  Uterus:  Absent  Adnexa/parametria:     Rt: Without masses or tenderness.   Lt: Without masses or tenderness.  Anus and  perineum:External hemorrhoids   Digital rectal exam: Normal sphincter tone without palpated masses or tenderness  Assessment/Plan:  53 y.o.MWF G2P2  for annual exam.    Supracervical hysterectomy on HRT Hypertension/hypercholesterolemia/chronic pain/GERD-primary care manages labs and meds Smoker  Plan: SBE's, continue annual mammogram, 3-D tomography reviewed and encouraged history of dense breast. Increase regular exercise is able, calcium rich diet, vitamin D 2000 daily encouraged. Repeat DEXA this year, will schedule. Vitamin D, Pap. Pap normal 2013, history of glandular cells on Pap 2012. Estradiol 2 mg by mouth daily, reviewed risks of blood clots, strokes, states still has hot flushes. Aware of hazards of smoking trying to cut back.    Huel Cote [facility] [place], 10:31 AM 08/17/2013

## 2013-08-17 NOTE — Patient Instructions (Signed)
Health Recommendations for Postmenopausal Women Respected and ongoing research has looked at the most common causes of death, disability, and poor quality of life in postmenopausal women. The causes include heart disease, diseases of blood vessels, diabetes, depression, cancer, and bone loss (osteoporosis). Many things can be done to help lower the chances of developing these and other common problems: CARDIOVASCULAR DISEASE Heart Disease: A heart attack is a medical emergency. Know the signs and symptoms of a heart attack. Below are things women can do to reduce their risk for heart disease.   Do not smoke. If you smoke, quit.  Aim for a healthy weight. Being overweight causes many preventable deaths. Eat a healthy and balanced diet and drink an adequate amount of liquids.  Get moving. Make a commitment to be more physically active. Aim for 30 minutes of activity on most, if not all days of the week.  Eat for heart health. Choose a diet that is low in saturated fat and cholesterol and eliminate trans fat. Include whole grains, vegetables, and fruits. Read and understand the labels on food containers before buying.  Know your numbers. Ask your caregiver to check your blood pressure, cholesterol (total, HDL, LDL, triglycerides) and blood glucose. Work with your caregiver on improving your entire clinical picture.  High blood pressure. Limit or stop your table salt intake (try salt substitute and food seasonings). Avoid salty foods and drinks. Read labels on food containers before buying. Eating well and exercising can help control high blood pressure. STROKE  Stroke is a medical emergency. Stroke may be the result of a blood clot in a blood vessel in the brain or by a brain hemorrhage (bleeding). Know the signs and symptoms of a stroke. To lower the risk of developing a stroke:  Avoid fatty foods.  Quit smoking.  Control your diabetes, blood pressure, and irregular heart rate. THROMBOPHLEBITIS  (BLOOD CLOT) OF THE LEG  Becoming overweight and leading a stationary lifestyle may also contribute to developing blood clots. Controlling your diet and exercising will help lower the risk of developing blood clots. CANCER SCREENING  Breast Cancer: Take steps to reduce your risk of breast cancer.  You should practice "breast self-awareness." This means understanding the normal appearance and feel of your breasts and should include breast self-examination. Any changes detected, no matter how small, should be reported to your caregiver.  After age 40, you should have a clinical breast exam (CBE) every year.  Starting at age 40, you should consider having a mammogram (breast X-ray) every year.  If you have a family history of breast cancer, talk to your caregiver about genetic screening.  If you are at high risk for breast cancer, talk to your caregiver about having an MRI and a mammogram every year.  Intestinal or Stomach Cancer: Tests to consider are a rectal exam, fecal occult blood, sigmoidoscopy, and colonoscopy. Women who are high risk may need to be screened at an earlier age and more often.  Cervical Cancer:  Beginning at age 30, you should have a Pap test every 3 years as long as the past 3 Pap tests have been normal.  If you have had past treatment for cervical cancer or a condition that could lead to cancer, you need Pap tests and screening for cancer for at least 20 years after your treatment.  If you had a hysterectomy for a problem that was not cancer or a condition that could lead to cancer, then you no longer need Pap tests.    If you are between ages 65 and 70, and you have had normal Pap tests going back 10 years, you no longer need Pap tests.  If Pap tests have been discontinued, risk factors (such as a new sexual partner) need to be reassessed to determine if screening should be resumed.  Some medical problems can increase the chance of getting cervical cancer. In these  cases, your caregiver may recommend more frequent screening and Pap tests.  Uterine Cancer: If you have vaginal bleeding after reaching menopause, you should notify your caregiver.  Ovarian cancer: Other than yearly pelvic exams, there are no reliable tests available to screen for ovarian cancer at this time except for yearly pelvic exams.  Lung Cancer: Yearly chest X-rays can detect lung cancer and should be done on high risk women, such as cigarette smokers and women with chronic lung disease (emphysema).  Skin Cancer: A complete body skin exam should be done at your yearly examination. Avoid overexposure to the sun and ultraviolet light lamps. Use a strong sun block cream when in the sun. All of these things are important in lowering the risk of skin cancer. MENOPAUSE Menopause Symptoms: Hormone therapy products are effective for treating symptoms associated with menopause:  Moderate to severe hot flashes.  Night sweats.  Mood swings.  Headaches.  Tiredness.  Loss of sex drive.  Insomnia.  Other symptoms. Hormone replacement carries certain risks, especially in older women. Women who use or are thinking about using estrogen or estrogen with progestin treatments should discuss that with their caregiver. Your caregiver will help you understand the benefits and risks. The ideal dose of hormone replacement therapy is not known. The Food and Drug Administration (FDA) has concluded that hormone therapy should be used only at the lowest doses and for the shortest amount of time to reach treatment goals.  OSTEOPOROSIS Protecting Against Bone Loss and Preventing Fracture: If you use hormone therapy for prevention of bone loss (osteoporosis), the risks for bone loss must outweigh the risk of the therapy. Ask your caregiver about other medications known to be safe and effective for preventing bone loss and fractures. To guard against bone loss or fractures, the following is recommended:  If  you are less than age 50, take 1000 mg of calcium and at least 600 mg of Vitamin D per day.  If you are greater than age 50 but less than age 70, take 1200 mg of calcium and at least 600 mg of Vitamin D per day.  If you are greater than age 70, take 1200 mg of calcium and at least 800 mg of Vitamin D per day. Smoking and excessive alcohol intake increases the risk of osteoporosis. Eat foods rich in calcium and vitamin D and do weight bearing exercises several times a week as your caregiver suggests. DIABETES Diabetes Melitus: If you have Type I or Type 2 diabetes, you should keep your blood sugar under control with diet, exercise and recommended medication. Avoid too many sweets, starchy and fatty foods. Being overweight can make control more difficult. COGNITION AND MEMORY Cognition and Memory: Menopausal hormone therapy is not recommended for the prevention of cognitive disorders such as Alzheimer's disease or memory loss.  DEPRESSION  Depression may occur at any age, but is common in elderly women. The reasons may be because of physical, medical, social (loneliness), or financial problems and needs. If you are experiencing depression because of medical problems and control of symptoms, talk to your caregiver about this. Physical activity and   exercise may help with mood and sleep. Community and volunteer involvement may help your sense of value and worth. If you have depression and you feel that the problem is getting worse or becoming severe, talk to your caregiver about treatment options that are best for you. ACCIDENTS  Accidents are common and can be serious in the elderly woman. Prepare your house to prevent accidents. Eliminate throw rugs, place hand bars in the bath, shower and toilet areas. Avoid wearing high heeled shoes or walking on wet, snowy, and icy areas. Limit or stop driving if you have vision or hearing problems, or you feel you are unsteady with you movements and  reflexes. HEPATITIS C Hepatitis C is a type of viral infection affecting the liver. It is spread mainly through contact with blood from an infected person. It can be treated, but if left untreated, it can lead to severe liver damage over years. Many people who are infected do not know that the virus is in their blood. If you are a "baby-boomer", it is recommended that you have one screening test for Hepatitis C. IMMUNIZATIONS  Several immunizations are important to consider having during your senior years, including:   Tetanus, diptheria, and pertussis booster shot.  Influenza every year before the flu season begins.  Pneumonia vaccine.  Shingles vaccine.  Others as indicated based on your specific needs. Talk to your caregiver about these. Document Released: 07/23/2005 Document Revised: 05/17/2012 Document Reviewed: 03/18/2008 [employer] Patient Information 2014 [employer].  

## 2013-08-18 LAB — VITAMIN D 25 HYDROXY (VIT D DEFICIENCY, FRACTURES): Vit D, 25-Hydroxy: 69 ng/mL (ref 30–89)

## 2013-08-20 ENCOUNTER — Encounter: Payer: Self-pay | Admitting: Women's Health

## 2013-08-29 ENCOUNTER — Other Ambulatory Visit: Payer: Self-pay | Admitting: Internal Medicine

## 2013-09-19 ENCOUNTER — Other Ambulatory Visit: Payer: Self-pay | Admitting: Internal Medicine

## 2013-10-31 ENCOUNTER — Telehealth: Payer: Self-pay | Admitting: Internal Medicine

## 2013-10-31 NOTE — Telephone Encounter (Signed)
Pt is having dental surgery on 11-07-13 and per her dentist Doristine Church she must stop her ASA 3 prior day.  Pt would like confirmation

## 2014-03-29 ENCOUNTER — Other Ambulatory Visit: Payer: Self-pay

## 2014-04-15 ENCOUNTER — Encounter: Payer: Self-pay | Admitting: Women's Health

## 2014-09-19 ENCOUNTER — Encounter: Payer: Self-pay | Admitting: Women's Health

## 2014-09-20 ENCOUNTER — Encounter: Payer: Self-pay | Admitting: Women's Health

## 2014-09-20 ENCOUNTER — Ambulatory Visit (INDEPENDENT_AMBULATORY_CARE_PROVIDER_SITE_OTHER): Payer: BLUE CROSS/BLUE SHIELD | Admitting: Women's Health

## 2014-09-20 VITALS — BP 132/84 | Ht 65.0 in | Wt 179.0 lb

## 2014-09-20 DIAGNOSIS — Z01419 Encounter for gynecological examination (general) (routine) without abnormal findings: Secondary | ICD-10-CM

## 2014-09-20 DIAGNOSIS — Z780 Asymptomatic menopausal state: Secondary | ICD-10-CM | POA: Diagnosis not present

## 2014-09-20 MED ORDER — ESTRADIOL 2 MG PO TABS: 2.0000 mg | ORAL_TABLET | Freq: Every day | ORAL | Status: DC

## 2014-09-20 NOTE — Progress Notes (Signed)
[name] 1960-12-31 [id number]    History:    Presents for annual exam.  Postmenopausal/supracervical hysterectomy/dysmenorrhea and menorrhagia on estradiol 2 mg daily for hot flashes. 2012 glandular cells on Pap, negative colposcopy and biopsy. 2013, 2015 negative Pap. Normal mammogram history. 2009 DEXA T score -0.9 spine, hip average 0.1. 2013 polyp on colonoscopy. HTN, hypercholesteremia, chronic pain, all managed by PCP. Recently had the flu. Smoker, using Chantix to quit.  Past medical history, past surgical history, family history and social history were all reviewed and documented in the EPIC chart. Works night shift. 5 sisters and 2 daughters, estranged  from oldest daughter [name] history of alcohol abuse, [name] doing well.   ROS:  A ROS was performed and pertinent positives and negatives are included.  Exam:  Filed Vitals:   09/20/54 1114  BP: 132/84    General appearance:  Normal Thyroid:  Symmetrical, normal in size, without palpable masses or nodularity. Respiratory  Auscultation:  Clear without wheezing or rhonchi Cardiovascular  Auscultation:  Regular rate, without rubs, murmurs or gallops  Edema/varicosities:  Not grossly evident Abdominal  Soft,nontender, without masses, guarding or rebound.  Liver/spleen:  No organomegaly noted  Hernia:  None appreciated  Skin  Inspection:  Grossly normal   Breasts: Examined lying and sitting.     Right: Without masses, retractions, discharge or axillary adenopathy.     Left: Without masses, retractions, discharge or axillary adenopathy. Gentitourinary   Inguinal/mons:  Normal without inguinal adenopathy  External genitalia:  Normal  BUS/Urethra/Skene's glands:  Normal  Vagina:  Normal  Cervix:  Normal  Uterus:  Absent  Adnexa/parametria:     Rt: Without masses or tenderness.   Lt: Without masses or tenderness.  Anus and perineum: Normal  Digital rectal exam: Normal sphincter tone without palpated masses or  tenderness  Assessment/Plan:  54 y.o. MWF G2P2 for annual exam.   Postmenopausal/supracervical hysterectomy /estradiol 2 mg daily 2012 glandular cells on Pap with negative C&B HTN/hypercholesterolemia/chronic back pain - PCP manages labs and meds Smoker Obesity  Plan: SBEs,  Recommended 3D mammogram,  history of dense breast tissue, instructed to schedule DEXA, continue vitamin D 2000 daily. Instructed to schedule colonoscopy for follow-up of polyp. Estradiol 2 mg daily prescription given, reviewed risks of blood clots, strokes and breast cancer. Women's health initiative, risks reviewed importance to decrease, taper off, states has had increased hot flashes when trying to stop HRT Encouraged to continue with Chantix for smoking cessation. Prevnar 13/Pneumovax recommended. Continue to increase regular exercise, decrease calories for continued weight loss encouraged. UA, Pap with HR HPV typing. New screening guidelines reviewed.    [name] WHNP, 11:40 AM 09/20/2014

## 2014-09-20 NOTE — Patient Instructions (Signed)
Health Recommendations for Postmenopausal Women Respected and ongoing research has looked at the most common causes of death, disability, and poor quality of life in postmenopausal women. The causes include heart disease, diseases of blood vessels, diabetes, depression, cancer, and bone loss (osteoporosis). Many things can be done to help lower the chances of developing these and other common problems. CARDIOVASCULAR DISEASE Heart Disease: A heart attack is a medical emergency. Know the signs and symptoms of a heart attack. Below are things women can do to reduce their risk for heart disease.   Do not smoke. If you smoke, quit.  Aim for a healthy weight. Being overweight causes many preventable deaths. Eat a healthy and balanced diet and drink an adequate amount of liquids.  Get moving. Make a commitment to be more physically active. Aim for 30 minutes of activity on most, if not all days of the week.  Eat for heart health. Choose a diet that is low in saturated fat and cholesterol and eliminate trans fat. Include whole grains, vegetables, and fruits. Read and understand the labels on food containers before buying.  Know your numbers. Ask your caregiver to check your blood pressure, cholesterol (total, HDL, LDL, triglycerides) and blood glucose. Work with your caregiver on improving your entire clinical picture.  High blood pressure. Limit or stop your table salt intake (try salt substitute and food seasonings). Avoid salty foods and drinks. Read labels on food containers before buying. Eating well and exercising can help control high blood pressure. STROKE  Stroke is a medical emergency. Stroke may be the result of a blood clot in a blood vessel in the brain or by a brain hemorrhage (bleeding). Know the signs and symptoms of a stroke. To lower the risk of developing a stroke:  Avoid fatty foods.  Quit smoking.  Control your diabetes, blood pressure, and irregular heart rate. THROMBOPHLEBITIS  (BLOOD CLOT) OF THE LEG  Becoming overweight and leading a stationary lifestyle may also contribute to developing blood clots. Controlling your diet and exercising will help lower the risk of developing blood clots. CANCER SCREENING  Breast Cancer: Take steps to reduce your risk of breast cancer.  You should practice "breast self-awareness." This means understanding the normal appearance and feel of your breasts and should include breast self-examination. Any changes detected, no matter how small, should be reported to your caregiver.  After age 40, you should have a clinical breast exam (CBE) every year.  Starting at age 40, you should consider having a mammogram (breast X-ray) every year.  If you have a family history of breast cancer, talk to your caregiver about genetic screening.  If you are at high risk for breast cancer, talk to your caregiver about having an MRI and a mammogram every year.  Intestinal or Stomach Cancer: Tests to consider are a rectal exam, fecal occult blood, sigmoidoscopy, and colonoscopy. Women who are high risk may need to be screened at an earlier age and more often.  Cervical Cancer:  Beginning at age 30, you should have a Pap test every 3 years as long as the past 3 Pap tests have been normal.  If you have had past treatment for cervical cancer or a condition that could lead to cancer, you need Pap tests and screening for cancer for at least 20 years after your treatment.  If you had a hysterectomy for a problem that was not cancer or a condition that could lead to cancer, then you no longer need Pap tests.    If you are between ages 65 and 70, and you have had normal Pap tests going back 10 years, you no longer need Pap tests.  If Pap tests have been discontinued, risk factors (such as a new sexual partner) need to be reassessed to determine if screening should be resumed.  Some medical problems can increase the chance of getting cervical cancer. In these  cases, your caregiver may recommend more frequent screening and Pap tests.  Uterine Cancer: If you have vaginal bleeding after reaching menopause, you should notify your caregiver.  Ovarian Cancer: Other than yearly pelvic exams, there are no reliable tests available to screen for ovarian cancer at this time except for yearly pelvic exams.  Lung Cancer: Yearly chest X-rays can detect lung cancer and should be done on high risk women, such as cigarette smokers and women with chronic lung disease (emphysema).  Skin Cancer: A complete body skin exam should be done at your yearly examination. Avoid overexposure to the sun and ultraviolet light lamps. Use a strong sun block cream when in the sun. All of these things are important for lowering the risk of skin cancer. MENOPAUSE Menopause Symptoms: Hormone therapy products are effective for treating symptoms associated with menopause:  Moderate to severe hot flashes.  Night sweats.  Mood swings.  Headaches.  Tiredness.  Loss of sex drive.  Insomnia.  Other symptoms. Hormone replacement carries certain risks, especially in older women. Women who use or are thinking about using estrogen or estrogen with progestin treatments should discuss that with their caregiver. Your caregiver will help you understand the benefits and risks. The ideal dose of hormone replacement therapy is not known. The Food and Drug Administration (FDA) has concluded that hormone therapy should be used only at the lowest doses and for the shortest amount of time to reach treatment goals.  OSTEOPOROSIS Protecting Against Bone Loss and Preventing Fracture If you use hormone therapy for prevention of bone loss (osteoporosis), the risks for bone loss must outweigh the risk of the therapy. Ask your caregiver about other medications known to be safe and effective for preventing bone loss and fractures. To guard against bone loss or fractures, the following is recommended:  If  you are younger than age 50, take 1000 mg of calcium and at least 600 mg of Vitamin D per day.  If you are older than age 50 but younger than age 70, take 1200 mg of calcium and at least 600 mg of Vitamin D per day.  If you are older than age 70, take 1200 mg of calcium and at least 800 mg of Vitamin D per day. Smoking and excessive alcohol intake increases the risk of osteoporosis. Eat foods rich in calcium and vitamin D and do weight bearing exercises several times a week as your caregiver suggests. DIABETES Diabetes Mellitus: If you have type I or type 2 diabetes, you should keep your blood sugar under control with diet, exercise, and recommended medication. Avoid starchy and fatty foods, and too many sweets. Being overweight can make diabetes control more difficult. COGNITION AND MEMORY Cognition and Memory: Menopausal hormone therapy is not recommended for the prevention of cognitive disorders such as Alzheimer's disease or memory loss.  DEPRESSION  Depression may occur at any age, but it is common in elderly women. This may be because of physical, medical, social (loneliness), or financial problems and needs. If you are experiencing depression because of medical problems and control of symptoms, talk to your caregiver about this. Physical   activity and exercise may help with mood and sleep. Community and volunteer involvement may improve your sense of value and worth. If you have depression and you feel that the problem is getting worse or becoming severe, talk to your caregiver about which treatment options are best for you. ACCIDENTS  Accidents are common and can be serious in elderly woman. Prepare your house to prevent accidents. Eliminate throw rugs, place hand bars in bath, shower, and toilet areas. Avoid wearing high heeled shoes or walking on wet, snowy, and icy areas. Limit or stop driving if you have vision or hearing problems, or if you feel you are unsteady with your movements and  reflexes. HEPATITIS C Hepatitis C is a type of viral infection affecting the liver. It is spread mainly through contact with blood from an infected person. It can be treated, but if left untreated, it can lead to severe liver damage over the years. Many people who are infected do not know that the virus is in their blood. If you are a "baby-boomer", it is recommended that you have one screening test for Hepatitis C. IMMUNIZATIONS  Several immunizations are important to consider having during your senior years, including:   Tetanus, diphtheria, and pertussis booster shot.  Influenza every year before the flu season begins.  Pneumonia vaccine.  Shingles vaccine.  Others, as indicated based on your specific needs. Talk to your caregiver about these. Document Released: 07/23/2005 Document Revised: 10/15/2013 Document Reviewed: 03/18/2008 [employer] Patient Information 2015 [employer]. This information is not intended to replace advice given to you by your health care provider. Make sure you discuss any questions you have with your health care provider.  

## 2014-10-24 ENCOUNTER — Telehealth: Payer: Self-pay | Admitting: *Deleted

## 2014-10-24 MED ORDER — NITROFURANTOIN MONOHYD MACRO 100 MG PO CAPS: 100.0000 mg | ORAL_CAPSULE | Freq: Two times a day (BID) | ORAL | Status: DC

## 2014-10-24 NOTE — Telephone Encounter (Signed)
(  You are back up MD) pt called c/o burning with urination, urgency, has no insurance and no money for OV, requesting RX. Please advise

## 2014-10-24 NOTE — Telephone Encounter (Signed)
Okay for Macrobid 100 mg twice a day 7 days 

## 2014-10-24 NOTE — Telephone Encounter (Signed)
Pt aware Rx sent.  

## 2015-04-23 ENCOUNTER — Encounter: Payer: Self-pay | Admitting: Neurology

## 2015-04-23 ENCOUNTER — Ambulatory Visit (INDEPENDENT_AMBULATORY_CARE_PROVIDER_SITE_OTHER): Payer: BLUE CROSS/BLUE SHIELD | Admitting: Neurology

## 2015-04-23 ENCOUNTER — Encounter: Payer: Self-pay | Admitting: *Deleted

## 2015-04-23 ENCOUNTER — Telehealth: Payer: Self-pay | Admitting: Neurology

## 2015-04-23 VITALS — Ht 65.0 in | Wt 182.0 lb

## 2015-04-23 DIAGNOSIS — G629 Polyneuropathy, unspecified: Secondary | ICD-10-CM | POA: Diagnosis not present

## 2015-04-23 DIAGNOSIS — R208 Other disturbances of skin sensation: Secondary | ICD-10-CM | POA: Insufficient documentation

## 2015-04-23 DIAGNOSIS — M792 Neuralgia and neuritis, unspecified: Secondary | ICD-10-CM | POA: Diagnosis not present

## 2015-04-23 MED ORDER — LIDOCAINE 5 % EX PTCH: 3.0000 | MEDICATED_PATCH | CUTANEOUS | Status: DC

## 2015-04-23 NOTE — Telephone Encounter (Signed)
Ins has been contacted and provided with clinical info, asking that they grant an exception and cover this medication.  Request is currently under review Ref # [id number]

## 2015-04-23 NOTE — Telephone Encounter (Signed)
[name], patient's lidoderm patch needs prior auth. Can you help? thanks

## 2015-04-23 NOTE — Telephone Encounter (Signed)
[employer] [place] has approved the request for coverage on Lidoderm Patches effective until 04/21/2017, or until the policy changes or is terminated.  Ins has notified the patient of this decision as well.

## 2015-04-23 NOTE — Progress Notes (Signed)
[facility]    Provider:  Dr Jaynee Eagles Referring Provider: Lisabeth Pick, MD Primary Care Physician:  Chancy Hurter, MD  CC:  Shoulder pain  HPI:  [name] is a 54 y.o. female here as a referral from Dr. Leanne Chang for shoulder pain. PMHx Migraine and HLD. She has shoulder pain below the scapula. It is stabbing. It is tingling and burning. She also has hip pain in the right hip, she has arthritis. Pain in the shoulder started a month ago. She woke up with it. No inciting factors. Wosr in the mornings when she gets up. She has tried Voltaren gel, she tried topical lidocaine, OTC analgesics have not helped. She describes it as numbness, tingling and burning. The patch really helps. She works 12 hours and cannot repeatedly apply gel on her back and she cannot reach to her back bu herself. Symptoms are stable but continuous. She did not notice any lesions or skin rash. She takes oxy and it does not help. She has had multiple back surgeries by Dr. Joya Salm. Gabapentin did not help. She is on Lyrica. She has seen orthopaedics for the shoulder pain and there is no damage. She has had EMG/NCS as well. Flexeril has not helped her pain either. TENS didn't help. The only thing that works is the patch.   Review of Systems: Patient complains of symptoms per HPI as well as the following symptoms: feeling hot, feeling cold, flushing, joint pain, cramps, joint swelling, aching muscles, numbness, weakness, shift work, restless legs, nt enough sleep, decreased energy, urination problems. Pertinent negatives per HPI. All others negative.   Social History   Social History  . Marital Status: Married    Spouse Name: [name]   . Number of Children: 2  . Years of Education: 9   Occupational History  . [employer]    Social History Main Topics  . Smoking status: Current Every Day Smoker -- 1.00 packs/day for 37 years    Types: Cigarettes  . Smokeless tobacco: Never Used  . Alcohol  Use: No  . Drug Use: No  . 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 Not on file   Social History Narrative   Lives at home with husband   Caffeine use: none    Family History  Problem Relation Age of Onset  . Diabetes Mother   . Hypertension Mother   . Heart disease Mother   . Hypertension Father   . Diabetes Father   . Heart disease Father   . Neuropathy Neg Hx     Past Medical History  Diagnosis Date  . Arthritis   . Hyperlipidemia   . Hypertension   . GERD (gastroesophageal reflux disease)   . Bladder spasms     Past Surgical History  Procedure Laterality Date  . Partial hysterectomy    . Back surgery  1989, 1990, 2006  . Cholecystectomy  1998  . Leg surgery  2009    work related injury  . Breast lumpectomy  1976    bi-lat , both benign     Current Outpatient Prescriptions  Medication Sig Dispense Refill  . ALPRAZolam (XANAX) 0.25 MG tablet Take 0.25 mg by mouth 2 (two) times daily as needed.  2  . aspirin 81 MG tablet Take 81 mg by mouth every other day.     Marland Kitchen atorvastatin (LIPITOR) 80 MG tablet Take 0.5 tablets (40  mg total) by mouth daily. 90 tablet 3  . Cholecalciferol (VITAMIN D) 2000 UNITS tablet Take 2,000 Units by mouth daily.    Marland Kitchen dexlansoprazole (DEXILANT) 60 MG capsule Take 60 mg by mouth daily.    Marland Kitchen estradiol (ESTRACE) 2 MG tablet Take 1 tablet (2 mg total) by mouth daily. 90 tablet 4  . Lido-Capsaicin-Men-Methyl Sal (MEDI-PATCH-LIDOCAINE) 0.5-0.035-5-20 % PTCH Apply 1 patch topically.    Marland Kitchen lisinopril (PRINIVIL,ZESTRIL) 5 MG tablet TAKE 1 TABLET BY MOUTH EVERY DAY 30 tablet 1  . loratadine (CLARITIN) 10 MG tablet Take 10 mg by mouth 2 (two) times daily as needed. For allergies    . OXYCONTIN 40 MG 12 hr tablet Take 40 mg by mouth every 8 (eight) hours as needed.  0  . pregabalin (LYRICA) 75 MG capsule Take 75 mg by mouth 3 (three) times daily.     . varenicline (CHANTIX) 0.5 MG tablet Take  0.5 mg by mouth 2 (two) times daily.    . vitamin B-12 (CYANOCOBALAMIN) 1000 MCG tablet Take 1,000 mcg by mouth daily.    Marland Kitchen lidocaine (LIDODERM) 5 % Place 3 patches onto the skin daily. Remove & Discard patch within 12 hours or as directed by MD 90 patch 11  . nitroGLYCERIN (NITRODUR - DOSED IN MG/24 HR) 0.2 mg/hr patch Place 1 patch onto the skin.  0   No current facility-administered medications for this visit.    Allergies as of 04/23/2015 - Review Complete 04/23/2015  Allergen Reaction Noted  . Codeine phosphate  02/22/2005  . Simvastatin  08/21/2007  . Sulfamethoxazole  02/22/2005    Vitals: Ht 5\' 5"  (1.651 m)  Wt 182 lb (82.555 kg)  BMI 30.29 kg/m2  LMP 04/22/1997 Last Weight:  Wt Readings from Last 1 Encounters:  04/23/15 182 lb (82.555 kg)   Last Height:   Ht Readings from Last 1 Encounters:  04/23/15 5\' 5"  (1.651 m)    Physical exam: Exam: Gen: NAD, conversant, well nourised, obese, well groomed                     CV: RRR, no MRG. No Carotid Bruits. No peripheral edema, warm, nontender Eyes: Conjunctivae clear without exudates or hemorrhage MSK: left scapular pain on palpation, allodynia,,hyperalgesia.  Neuro: Detailed Neurologic Exam  Speech:    Speech is normal; fluent and spontaneous with normal comprehension.  Cognition:    The patient is oriented to person, place, and time;     recent and remote memory intact;     language fluent;     normal attention, concentration,     fund of knowledge Cranial Nerves:    The pupils are equal, round, and reactive to light. The fundi are normal and spontaneous venous pulsations are present. Visual fields are full to finger confrontation. Extraocular movements are intact. Trigeminal sensation is intact and the muscles of mastication are normal. The face is symmetric. The palate elevates in the midline. Hearing intact. Voice is normal. Shoulder shrug is normal. The tongue has normal motion without fasciculations.    Coordination:    Normal finger to nose and heel to shin. Normal rapid alternating movements.   Gait:    No ataxia  Motor Observation:    No asymmetry, no atrophy, and no involuntary movements noted. Tone:    Normal muscle tone.    Posture:    Posture is normal. normal erect    Strength: Left foot drop otherwise strength is V/V in the upper and lower  limbs.      Sensation: intact to LT     Reflex Exam:  DTR's: Hyporeflexia in the lowers. Toes:    The toes are downgoing bilaterally.   Clonus:    Clonus is absent.      Assessment/Plan:  54 year old with left-sided neuritis in the scapular area. Recommend lidoderm patches. F/u as needed.   Sarina Ill, MD  [facility] [address] [place], [place] [address]  Phone [phone] Fax [phone]

## 2015-04-23 NOTE — Patient Instructions (Signed)
Overall you are doing fairly well but I do want to suggest a few things today:   Remember to drink plenty of fluid, eat healthy meals and do not skip any meals. Try to eat protein with a every meal and eat a healthy snack such as fruit or nuts in between meals. Try to keep a regular sleep-wake schedule and try to exercise daily, particularly in the form of walking, 20-30 minutes a day, if you can.   As far as your medications are concerned, I would like to suggest: Lidoderm patch 12 hours on, 12 hours off  I would like to see you back as needed, sooner if we need to. Please call us with any interim questions, concerns, problems, updates or refill requests.   Please also call us for any test results so we can go over those with you on the phone.  My clinical assistant and will answer any of your questions and relay your messages to me and also relay most of my messages to you.   Our phone number is [phone]. We also have an after hours call service for urgent matters and there is a physician on-call for urgent questions. For any emergencies you know to call 911 or go to the nearest emergency room

## 2015-04-24 NOTE — Telephone Encounter (Signed)
I called the patient to advise.  Got no answer, left message.

## 2015-04-24 NOTE — Telephone Encounter (Signed)
[place] !!! Would you let patient know please?

## 2015-07-25 ENCOUNTER — Other Ambulatory Visit: Payer: Self-pay | Admitting: Gynecology

## 2015-09-24 DIAGNOSIS — M79605 Pain in left leg: Secondary | ICD-10-CM | POA: Diagnosis not present

## 2015-09-24 DIAGNOSIS — Z79891 Long term (current) use of opiate analgesic: Secondary | ICD-10-CM | POA: Diagnosis not present

## 2015-09-24 DIAGNOSIS — M961 Postlaminectomy syndrome, not elsewhere classified: Secondary | ICD-10-CM | POA: Diagnosis not present

## 2015-09-24 DIAGNOSIS — G894 Chronic pain syndrome: Secondary | ICD-10-CM | POA: Diagnosis not present

## 2015-10-08 ENCOUNTER — Other Ambulatory Visit: Payer: Self-pay

## 2015-10-08 DIAGNOSIS — R252 Cramp and spasm: Secondary | ICD-10-CM | POA: Diagnosis not present

## 2015-10-08 DIAGNOSIS — Z720 Tobacco use: Secondary | ICD-10-CM | POA: Diagnosis not present

## 2015-10-08 DIAGNOSIS — R0989 Other specified symptoms and signs involving the circulatory and respiratory systems: Secondary | ICD-10-CM | POA: Diagnosis not present

## 2015-10-08 DIAGNOSIS — Z780 Asymptomatic menopausal state: Secondary | ICD-10-CM

## 2015-10-08 DIAGNOSIS — Z8639 Personal history of other endocrine, nutritional and metabolic disease: Secondary | ICD-10-CM | POA: Diagnosis not present

## 2015-10-08 MED ORDER — ESTRADIOL 2 MG PO TABS: 2.0000 mg | ORAL_TABLET | Freq: Every day | ORAL | Status: DC

## 2015-10-13 ENCOUNTER — Other Ambulatory Visit: Payer: Self-pay | Admitting: Internal Medicine

## 2015-10-13 DIAGNOSIS — R0989 Other specified symptoms and signs involving the circulatory and respiratory systems: Secondary | ICD-10-CM

## 2015-10-21 ENCOUNTER — Other Ambulatory Visit: Payer: Self-pay

## 2015-11-19 DIAGNOSIS — M79605 Pain in left leg: Secondary | ICD-10-CM | POA: Diagnosis not present

## 2015-11-19 DIAGNOSIS — Z79891 Long term (current) use of opiate analgesic: Secondary | ICD-10-CM | POA: Diagnosis not present

## 2015-11-19 DIAGNOSIS — G894 Chronic pain syndrome: Secondary | ICD-10-CM | POA: Diagnosis not present

## 2015-11-19 DIAGNOSIS — M961 Postlaminectomy syndrome, not elsewhere classified: Secondary | ICD-10-CM | POA: Diagnosis not present

## 2016-02-02 DIAGNOSIS — Z79891 Long term (current) use of opiate analgesic: Secondary | ICD-10-CM | POA: Diagnosis not present

## 2016-02-02 DIAGNOSIS — M79605 Pain in left leg: Secondary | ICD-10-CM | POA: Diagnosis not present

## 2016-02-02 DIAGNOSIS — M961 Postlaminectomy syndrome, not elsewhere classified: Secondary | ICD-10-CM | POA: Diagnosis not present

## 2016-02-02 DIAGNOSIS — G894 Chronic pain syndrome: Secondary | ICD-10-CM | POA: Diagnosis not present

## 2016-04-09 DIAGNOSIS — Z23 Encounter for immunization: Secondary | ICD-10-CM | POA: Diagnosis not present

## 2016-04-16 DIAGNOSIS — M961 Postlaminectomy syndrome, not elsewhere classified: Secondary | ICD-10-CM | POA: Diagnosis not present

## 2016-04-16 DIAGNOSIS — G894 Chronic pain syndrome: Secondary | ICD-10-CM | POA: Diagnosis not present

## 2016-04-16 DIAGNOSIS — M79605 Pain in left leg: Secondary | ICD-10-CM | POA: Diagnosis not present

## 2016-04-16 DIAGNOSIS — Z79891 Long term (current) use of opiate analgesic: Secondary | ICD-10-CM | POA: Diagnosis not present

## 2016-05-29 DIAGNOSIS — J019 Acute sinusitis, unspecified: Secondary | ICD-10-CM | POA: Diagnosis not present

## 2016-05-29 DIAGNOSIS — R05 Cough: Secondary | ICD-10-CM | POA: Diagnosis not present

## 2016-06-02 DIAGNOSIS — M79642 Pain in left hand: Secondary | ICD-10-CM | POA: Diagnosis not present

## 2016-06-02 DIAGNOSIS — M79641 Pain in right hand: Secondary | ICD-10-CM | POA: Diagnosis not present

## 2016-06-15 DIAGNOSIS — Z79891 Long term (current) use of opiate analgesic: Secondary | ICD-10-CM | POA: Diagnosis not present

## 2016-06-15 DIAGNOSIS — G894 Chronic pain syndrome: Secondary | ICD-10-CM | POA: Diagnosis not present

## 2016-06-15 DIAGNOSIS — M79605 Pain in left leg: Secondary | ICD-10-CM | POA: Diagnosis not present

## 2016-06-15 DIAGNOSIS — M961 Postlaminectomy syndrome, not elsewhere classified: Secondary | ICD-10-CM | POA: Diagnosis not present

## 2016-07-09 DIAGNOSIS — R197 Diarrhea, unspecified: Secondary | ICD-10-CM | POA: Diagnosis not present

## 2016-08-09 ENCOUNTER — Other Ambulatory Visit: Payer: Self-pay | Admitting: Neurology

## 2016-08-09 DIAGNOSIS — M792 Neuralgia and neuritis, unspecified: Secondary | ICD-10-CM

## 2016-08-09 DIAGNOSIS — G629 Polyneuropathy, unspecified: Secondary | ICD-10-CM

## 2016-08-09 DIAGNOSIS — R208 Other disturbances of skin sensation: Secondary | ICD-10-CM

## 2016-08-19 DIAGNOSIS — G894 Chronic pain syndrome: Secondary | ICD-10-CM | POA: Diagnosis not present

## 2016-08-19 DIAGNOSIS — M79605 Pain in left leg: Secondary | ICD-10-CM | POA: Diagnosis not present

## 2016-08-19 DIAGNOSIS — Z79891 Long term (current) use of opiate analgesic: Secondary | ICD-10-CM | POA: Diagnosis not present

## 2016-08-19 DIAGNOSIS — M961 Postlaminectomy syndrome, not elsewhere classified: Secondary | ICD-10-CM | POA: Diagnosis not present

## 2016-08-19 DIAGNOSIS — Z0001 Encounter for general adult medical examination with abnormal findings: Secondary | ICD-10-CM | POA: Diagnosis not present

## 2016-08-19 DIAGNOSIS — E559 Vitamin D deficiency, unspecified: Secondary | ICD-10-CM | POA: Diagnosis not present

## 2016-08-19 DIAGNOSIS — R739 Hyperglycemia, unspecified: Secondary | ICD-10-CM | POA: Diagnosis not present

## 2016-08-27 DIAGNOSIS — I10 Essential (primary) hypertension: Secondary | ICD-10-CM | POA: Diagnosis not present

## 2016-08-27 DIAGNOSIS — E7800 Pure hypercholesterolemia, unspecified: Secondary | ICD-10-CM | POA: Diagnosis not present

## 2016-08-27 DIAGNOSIS — Z0000 Encounter for general adult medical examination without abnormal findings: Secondary | ICD-10-CM | POA: Diagnosis not present

## 2016-08-27 DIAGNOSIS — K219 Gastro-esophageal reflux disease without esophagitis: Secondary | ICD-10-CM | POA: Diagnosis not present

## 2016-10-05 ENCOUNTER — Other Ambulatory Visit: Payer: Self-pay | Admitting: Neurology

## 2016-10-05 DIAGNOSIS — G629 Polyneuropathy, unspecified: Secondary | ICD-10-CM

## 2016-10-05 DIAGNOSIS — M792 Neuralgia and neuritis, unspecified: Secondary | ICD-10-CM

## 2016-10-05 DIAGNOSIS — R208 Other disturbances of skin sensation: Secondary | ICD-10-CM

## 2016-10-11 DIAGNOSIS — I10 Essential (primary) hypertension: Secondary | ICD-10-CM | POA: Diagnosis not present

## 2016-10-11 DIAGNOSIS — M549 Dorsalgia, unspecified: Secondary | ICD-10-CM | POA: Diagnosis not present

## 2016-10-11 DIAGNOSIS — R252 Cramp and spasm: Secondary | ICD-10-CM | POA: Diagnosis not present

## 2016-10-15 DIAGNOSIS — Z79891 Long term (current) use of opiate analgesic: Secondary | ICD-10-CM | POA: Diagnosis not present

## 2016-10-15 DIAGNOSIS — G894 Chronic pain syndrome: Secondary | ICD-10-CM | POA: Diagnosis not present

## 2016-10-15 DIAGNOSIS — M961 Postlaminectomy syndrome, not elsewhere classified: Secondary | ICD-10-CM | POA: Diagnosis not present

## 2016-10-15 DIAGNOSIS — M79605 Pain in left leg: Secondary | ICD-10-CM | POA: Diagnosis not present

## 2016-12-01 DIAGNOSIS — M79642 Pain in left hand: Secondary | ICD-10-CM | POA: Diagnosis not present

## 2016-12-01 DIAGNOSIS — M79641 Pain in right hand: Secondary | ICD-10-CM | POA: Diagnosis not present

## 2016-12-09 DIAGNOSIS — M549 Dorsalgia, unspecified: Secondary | ICD-10-CM | POA: Diagnosis not present

## 2016-12-09 DIAGNOSIS — M79643 Pain in unspecified hand: Secondary | ICD-10-CM | POA: Diagnosis not present

## 2016-12-09 DIAGNOSIS — M1990 Unspecified osteoarthritis, unspecified site: Secondary | ICD-10-CM | POA: Diagnosis not present

## 2016-12-09 DIAGNOSIS — M48061 Spinal stenosis, lumbar region without neurogenic claudication: Secondary | ICD-10-CM | POA: Diagnosis not present

## 2016-12-09 DIAGNOSIS — M7989 Other specified soft tissue disorders: Secondary | ICD-10-CM | POA: Diagnosis not present

## 2016-12-10 DIAGNOSIS — M79605 Pain in left leg: Secondary | ICD-10-CM | POA: Diagnosis not present

## 2016-12-10 DIAGNOSIS — G894 Chronic pain syndrome: Secondary | ICD-10-CM | POA: Diagnosis not present

## 2016-12-10 DIAGNOSIS — M961 Postlaminectomy syndrome, not elsewhere classified: Secondary | ICD-10-CM | POA: Diagnosis not present

## 2016-12-10 DIAGNOSIS — Z79891 Long term (current) use of opiate analgesic: Secondary | ICD-10-CM | POA: Diagnosis not present

## 2016-12-13 DIAGNOSIS — N3001 Acute cystitis with hematuria: Secondary | ICD-10-CM | POA: Diagnosis not present

## 2016-12-13 DIAGNOSIS — R300 Dysuria: Secondary | ICD-10-CM | POA: Diagnosis not present

## 2016-12-22 DIAGNOSIS — N390 Urinary tract infection, site not specified: Secondary | ICD-10-CM | POA: Diagnosis not present

## 2016-12-22 DIAGNOSIS — R319 Hematuria, unspecified: Secondary | ICD-10-CM | POA: Diagnosis not present

## 2017-01-22 DIAGNOSIS — L239 Allergic contact dermatitis, unspecified cause: Secondary | ICD-10-CM | POA: Diagnosis not present

## 2017-02-04 DIAGNOSIS — M79605 Pain in left leg: Secondary | ICD-10-CM | POA: Diagnosis not present

## 2017-02-04 DIAGNOSIS — Z79891 Long term (current) use of opiate analgesic: Secondary | ICD-10-CM | POA: Diagnosis not present

## 2017-02-04 DIAGNOSIS — G894 Chronic pain syndrome: Secondary | ICD-10-CM | POA: Diagnosis not present

## 2017-02-04 DIAGNOSIS — M961 Postlaminectomy syndrome, not elsewhere classified: Secondary | ICD-10-CM | POA: Diagnosis not present

## 2017-04-01 DIAGNOSIS — M961 Postlaminectomy syndrome, not elsewhere classified: Secondary | ICD-10-CM | POA: Diagnosis not present

## 2017-04-01 DIAGNOSIS — G894 Chronic pain syndrome: Secondary | ICD-10-CM | POA: Diagnosis not present

## 2017-04-01 DIAGNOSIS — M79605 Pain in left leg: Secondary | ICD-10-CM | POA: Diagnosis not present

## 2017-04-01 DIAGNOSIS — Z79891 Long term (current) use of opiate analgesic: Secondary | ICD-10-CM | POA: Diagnosis not present

## 2017-05-27 DIAGNOSIS — Z79891 Long term (current) use of opiate analgesic: Secondary | ICD-10-CM | POA: Diagnosis not present

## 2017-05-27 DIAGNOSIS — G894 Chronic pain syndrome: Secondary | ICD-10-CM | POA: Diagnosis not present

## 2017-05-27 DIAGNOSIS — M79605 Pain in left leg: Secondary | ICD-10-CM | POA: Diagnosis not present

## 2017-05-27 DIAGNOSIS — M961 Postlaminectomy syndrome, not elsewhere classified: Secondary | ICD-10-CM | POA: Diagnosis not present

## 2017-07-22 DIAGNOSIS — Z79891 Long term (current) use of opiate analgesic: Secondary | ICD-10-CM | POA: Diagnosis not present

## 2017-07-22 DIAGNOSIS — M961 Postlaminectomy syndrome, not elsewhere classified: Secondary | ICD-10-CM | POA: Diagnosis not present

## 2017-07-22 DIAGNOSIS — G894 Chronic pain syndrome: Secondary | ICD-10-CM | POA: Diagnosis not present

## 2017-07-22 DIAGNOSIS — M79605 Pain in left leg: Secondary | ICD-10-CM | POA: Diagnosis not present

## 2017-08-29 DIAGNOSIS — N390 Urinary tract infection, site not specified: Secondary | ICD-10-CM | POA: Diagnosis not present

## 2017-08-29 DIAGNOSIS — E7800 Pure hypercholesterolemia, unspecified: Secondary | ICD-10-CM | POA: Diagnosis not present

## 2017-08-29 DIAGNOSIS — R7301 Impaired fasting glucose: Secondary | ICD-10-CM | POA: Diagnosis not present

## 2017-08-29 DIAGNOSIS — E559 Vitamin D deficiency, unspecified: Secondary | ICD-10-CM | POA: Diagnosis not present

## 2017-08-29 DIAGNOSIS — I10 Essential (primary) hypertension: Secondary | ICD-10-CM | POA: Diagnosis not present

## 2017-09-02 DIAGNOSIS — R7301 Impaired fasting glucose: Secondary | ICD-10-CM | POA: Diagnosis not present

## 2017-09-02 DIAGNOSIS — E559 Vitamin D deficiency, unspecified: Secondary | ICD-10-CM | POA: Diagnosis not present

## 2017-09-02 DIAGNOSIS — Z0000 Encounter for general adult medical examination without abnormal findings: Secondary | ICD-10-CM | POA: Diagnosis not present

## 2017-09-19 DIAGNOSIS — M79605 Pain in left leg: Secondary | ICD-10-CM | POA: Diagnosis not present

## 2017-09-19 DIAGNOSIS — Z79891 Long term (current) use of opiate analgesic: Secondary | ICD-10-CM | POA: Diagnosis not present

## 2017-09-19 DIAGNOSIS — M961 Postlaminectomy syndrome, not elsewhere classified: Secondary | ICD-10-CM | POA: Diagnosis not present

## 2017-09-19 DIAGNOSIS — G894 Chronic pain syndrome: Secondary | ICD-10-CM | POA: Diagnosis not present

## 2017-09-22 DIAGNOSIS — N390 Urinary tract infection, site not specified: Secondary | ICD-10-CM | POA: Diagnosis not present

## 2017-11-28 DIAGNOSIS — G894 Chronic pain syndrome: Secondary | ICD-10-CM | POA: Diagnosis not present

## 2017-11-28 DIAGNOSIS — Z79891 Long term (current) use of opiate analgesic: Secondary | ICD-10-CM | POA: Diagnosis not present

## 2017-11-28 DIAGNOSIS — I10 Essential (primary) hypertension: Secondary | ICD-10-CM | POA: Diagnosis not present

## 2017-11-28 DIAGNOSIS — M961 Postlaminectomy syndrome, not elsewhere classified: Secondary | ICD-10-CM | POA: Diagnosis not present

## 2017-11-28 DIAGNOSIS — R062 Wheezing: Secondary | ICD-10-CM | POA: Diagnosis not present

## 2017-11-28 DIAGNOSIS — J189 Pneumonia, unspecified organism: Secondary | ICD-10-CM | POA: Diagnosis not present

## 2017-11-28 DIAGNOSIS — M79605 Pain in left leg: Secondary | ICD-10-CM | POA: Diagnosis not present

## 2018-01-30 DIAGNOSIS — G894 Chronic pain syndrome: Secondary | ICD-10-CM | POA: Diagnosis not present

## 2018-01-30 DIAGNOSIS — Z79891 Long term (current) use of opiate analgesic: Secondary | ICD-10-CM | POA: Diagnosis not present

## 2018-01-30 DIAGNOSIS — M961 Postlaminectomy syndrome, not elsewhere classified: Secondary | ICD-10-CM | POA: Diagnosis not present

## 2018-01-30 DIAGNOSIS — M79605 Pain in left leg: Secondary | ICD-10-CM | POA: Diagnosis not present

## 2018-03-01 DIAGNOSIS — M48062 Spinal stenosis, lumbar region with neurogenic claudication: Secondary | ICD-10-CM | POA: Diagnosis not present

## 2018-03-09 ENCOUNTER — Other Ambulatory Visit: Payer: Self-pay | Admitting: Neurological Surgery

## 2018-03-09 DIAGNOSIS — M48062 Spinal stenosis, lumbar region with neurogenic claudication: Secondary | ICD-10-CM

## 2018-03-18 ENCOUNTER — Ambulatory Visit
Admission: RE | Admit: 2018-03-18 | Discharge: 2018-03-18 | Disposition: A | Discharge status: Home / Self Care | Payer: Self-pay | Source: Ambulatory Visit | Attending: Neurological Surgery | Admitting: Neurological Surgery

## 2018-03-18 DIAGNOSIS — M48062 Spinal stenosis, lumbar region with neurogenic claudication: Secondary | ICD-10-CM

## 2018-03-18 DIAGNOSIS — M48061 Spinal stenosis, lumbar region without neurogenic claudication: Secondary | ICD-10-CM | POA: Diagnosis not present

## 2018-03-30 DIAGNOSIS — I10 Essential (primary) hypertension: Secondary | ICD-10-CM | POA: Diagnosis not present

## 2018-03-30 DIAGNOSIS — Z6834 Body mass index (BMI) 34.0-34.9, adult: Secondary | ICD-10-CM | POA: Diagnosis not present

## 2018-03-30 DIAGNOSIS — M5416 Radiculopathy, lumbar region: Secondary | ICD-10-CM | POA: Diagnosis not present

## 2018-08-01 DIAGNOSIS — Z79891 Long term (current) use of opiate analgesic: Secondary | ICD-10-CM | POA: Diagnosis not present

## 2018-08-01 DIAGNOSIS — G894 Chronic pain syndrome: Secondary | ICD-10-CM | POA: Diagnosis not present

## 2018-09-07 DIAGNOSIS — E559 Vitamin D deficiency, unspecified: Secondary | ICD-10-CM | POA: Diagnosis not present

## 2018-09-07 DIAGNOSIS — Z0000 Encounter for general adult medical examination without abnormal findings: Secondary | ICD-10-CM | POA: Diagnosis not present

## 2018-09-12 DIAGNOSIS — R739 Hyperglycemia, unspecified: Secondary | ICD-10-CM | POA: Diagnosis not present

## 2018-09-12 DIAGNOSIS — D649 Anemia, unspecified: Secondary | ICD-10-CM | POA: Diagnosis not present

## 2018-09-12 DIAGNOSIS — R7301 Impaired fasting glucose: Secondary | ICD-10-CM | POA: Diagnosis not present

## 2018-09-12 DIAGNOSIS — D508 Other iron deficiency anemias: Secondary | ICD-10-CM | POA: Diagnosis not present

## 2018-09-12 DIAGNOSIS — Z0000 Encounter for general adult medical examination without abnormal findings: Secondary | ICD-10-CM | POA: Diagnosis not present

## 2018-09-12 DIAGNOSIS — R310 Gross hematuria: Secondary | ICD-10-CM | POA: Diagnosis not present

## 2018-09-12 DIAGNOSIS — N390 Urinary tract infection, site not specified: Secondary | ICD-10-CM | POA: Diagnosis not present

## 2018-10-02 DIAGNOSIS — D508 Other iron deficiency anemias: Secondary | ICD-10-CM | POA: Diagnosis not present

## 2018-10-02 DIAGNOSIS — N3001 Acute cystitis with hematuria: Secondary | ICD-10-CM | POA: Diagnosis not present

## 2018-10-02 DIAGNOSIS — R7989 Other specified abnormal findings of blood chemistry: Secondary | ICD-10-CM | POA: Diagnosis not present

## 2018-10-02 DIAGNOSIS — N390 Urinary tract infection, site not specified: Secondary | ICD-10-CM | POA: Diagnosis not present

## 2018-10-03 DIAGNOSIS — N3001 Acute cystitis with hematuria: Secondary | ICD-10-CM | POA: Diagnosis not present

## 2018-10-03 DIAGNOSIS — E7800 Pure hypercholesterolemia, unspecified: Secondary | ICD-10-CM | POA: Diagnosis not present

## 2018-10-03 DIAGNOSIS — I10 Essential (primary) hypertension: Secondary | ICD-10-CM | POA: Diagnosis not present

## 2018-10-03 DIAGNOSIS — D508 Other iron deficiency anemias: Secondary | ICD-10-CM | POA: Diagnosis not present

## 2018-11-07 DIAGNOSIS — E875 Hyperkalemia: Secondary | ICD-10-CM | POA: Diagnosis not present

## 2018-11-07 DIAGNOSIS — D509 Iron deficiency anemia, unspecified: Secondary | ICD-10-CM | POA: Diagnosis not present

## 2018-11-07 DIAGNOSIS — E7800 Pure hypercholesterolemia, unspecified: Secondary | ICD-10-CM | POA: Diagnosis not present

## 2018-11-07 DIAGNOSIS — I10 Essential (primary) hypertension: Secondary | ICD-10-CM | POA: Diagnosis not present

## 2018-11-07 DIAGNOSIS — R7989 Other specified abnormal findings of blood chemistry: Secondary | ICD-10-CM | POA: Diagnosis not present

## 2018-11-21 DIAGNOSIS — G894 Chronic pain syndrome: Secondary | ICD-10-CM | POA: Diagnosis not present

## 2018-11-21 DIAGNOSIS — M961 Postlaminectomy syndrome, not elsewhere classified: Secondary | ICD-10-CM | POA: Diagnosis not present

## 2018-11-21 DIAGNOSIS — Z79891 Long term (current) use of opiate analgesic: Secondary | ICD-10-CM | POA: Diagnosis not present

## 2018-11-21 DIAGNOSIS — M25551 Pain in right hip: Secondary | ICD-10-CM | POA: Diagnosis not present

## 2019-01-16 DIAGNOSIS — Z79891 Long term (current) use of opiate analgesic: Secondary | ICD-10-CM | POA: Diagnosis not present

## 2019-01-16 DIAGNOSIS — M961 Postlaminectomy syndrome, not elsewhere classified: Secondary | ICD-10-CM | POA: Diagnosis not present

## 2019-01-16 DIAGNOSIS — G894 Chronic pain syndrome: Secondary | ICD-10-CM | POA: Diagnosis not present

## 2019-01-16 DIAGNOSIS — M25551 Pain in right hip: Secondary | ICD-10-CM | POA: Diagnosis not present

## 2019-02-01 DIAGNOSIS — R232 Flushing: Secondary | ICD-10-CM | POA: Diagnosis not present

## 2019-02-01 DIAGNOSIS — D508 Other iron deficiency anemias: Secondary | ICD-10-CM | POA: Diagnosis not present

## 2019-02-01 DIAGNOSIS — E7800 Pure hypercholesterolemia, unspecified: Secondary | ICD-10-CM | POA: Diagnosis not present

## 2019-02-01 DIAGNOSIS — R7301 Impaired fasting glucose: Secondary | ICD-10-CM | POA: Diagnosis not present

## 2019-02-01 DIAGNOSIS — I10 Essential (primary) hypertension: Secondary | ICD-10-CM | POA: Diagnosis not present

## 2019-02-01 DIAGNOSIS — E039 Hypothyroidism, unspecified: Secondary | ICD-10-CM | POA: Diagnosis not present

## 2019-03-13 ENCOUNTER — Encounter: Payer: Self-pay | Admitting: Gynecology

## 2019-03-30 DIAGNOSIS — M25551 Pain in right hip: Secondary | ICD-10-CM | POA: Diagnosis not present

## 2019-03-30 DIAGNOSIS — Z79891 Long term (current) use of opiate analgesic: Secondary | ICD-10-CM | POA: Diagnosis not present

## 2019-03-30 DIAGNOSIS — Z23 Encounter for immunization: Secondary | ICD-10-CM | POA: Diagnosis not present

## 2019-03-30 DIAGNOSIS — G894 Chronic pain syndrome: Secondary | ICD-10-CM | POA: Diagnosis not present

## 2019-03-30 DIAGNOSIS — M961 Postlaminectomy syndrome, not elsewhere classified: Secondary | ICD-10-CM | POA: Diagnosis not present

## 2019-06-01 DIAGNOSIS — G894 Chronic pain syndrome: Secondary | ICD-10-CM | POA: Diagnosis not present

## 2019-06-01 DIAGNOSIS — Z79891 Long term (current) use of opiate analgesic: Secondary | ICD-10-CM | POA: Diagnosis not present

## 2019-06-01 DIAGNOSIS — M25551 Pain in right hip: Secondary | ICD-10-CM | POA: Diagnosis not present

## 2019-06-01 DIAGNOSIS — M961 Postlaminectomy syndrome, not elsewhere classified: Secondary | ICD-10-CM | POA: Diagnosis not present

## 2019-08-31 DIAGNOSIS — G894 Chronic pain syndrome: Secondary | ICD-10-CM | POA: Diagnosis not present

## 2019-08-31 DIAGNOSIS — Z79891 Long term (current) use of opiate analgesic: Secondary | ICD-10-CM | POA: Diagnosis not present

## 2019-08-31 DIAGNOSIS — M961 Postlaminectomy syndrome, not elsewhere classified: Secondary | ICD-10-CM | POA: Diagnosis not present

## 2019-08-31 DIAGNOSIS — M150 Primary generalized (osteo)arthritis: Secondary | ICD-10-CM | POA: Diagnosis not present

## 2019-09-03 ENCOUNTER — Ambulatory Visit: Payer: BC Managed Care – PPO | Admitting: Women's Health

## 2019-09-03 ENCOUNTER — Encounter: Payer: Self-pay | Admitting: Women's Health

## 2019-09-03 ENCOUNTER — Other Ambulatory Visit: Payer: Self-pay

## 2019-09-03 VITALS — BP 128/80 | Ht 65.0 in | Wt 206.0 lb

## 2019-09-03 DIAGNOSIS — Z01419 Encounter for gynecological examination (general) (routine) without abnormal findings: Secondary | ICD-10-CM | POA: Diagnosis not present

## 2019-09-03 NOTE — Patient Instructions (Addendum)
Vit D 2000 iu daily Mammogram at [facility] shingrex vaccine  2 series shingles vaccine Health Maintenance for Postmenopausal Women Menopause is a normal process in which your ability to get pregnant comes to an end. This process happens slowly over many months or years, usually between the ages of 81 and 53. Menopause is complete when you have missed your menstrual periods for 12 months. It is important to talk with your health care provider about some of the most common conditions that affect women after menopause (postmenopausal women). These include heart disease, cancer, and bone loss (osteoporosis). Adopting a healthy lifestyle and getting preventive care can help to promote your health and wellness. The actions you take can also lower your chances of developing some of these common conditions. What should I know about menopause? During menopause, you may get a number of symptoms, such as:  Hot flashes. These can be moderate or severe.  Night sweats.  Decrease in sex drive.  Mood swings.  Headaches.  Tiredness.  Irritability.  Memory problems.  Insomnia. Choosing to treat or not to treat these symptoms is a decision that you make with your health care provider. Do I need hormone replacement therapy?  Hormone replacement therapy is effective in treating symptoms that are caused by menopause, such as hot flashes and night sweats.  Hormone replacement carries certain risks, especially as you become older. If you are thinking about using estrogen or estrogen with progestin, discuss the benefits and risks with your health care provider. What is my risk for heart disease and stroke? The risk of heart disease, heart attack, and stroke increases as you age. One of the causes may be a change in the body's hormones during menopause. This can affect how your body uses dietary fats, triglycerides, and cholesterol. Heart attack and stroke are medical emergencies. There are many things that you  can do to help prevent heart disease and stroke. Watch your blood pressure  High blood pressure causes heart disease and increases the risk of stroke. This is more likely to develop in people who have high blood pressure readings, are of African descent, or are overweight.  Have your blood pressure checked: ? Every 3-5 years if you are 73-58 years of age. ? Every year if you are 93 years old or older. Eat a healthy diet   Eat a diet that includes plenty of vegetables, fruits, low-fat dairy products, and lean protein.  Do not eat a lot of foods that are high in solid fats, added sugars, or sodium. Get regular exercise Get regular exercise. This is one of the most important things you can do for your health. Most adults should:  Try to exercise for at least 150 minutes each week. The exercise should increase your heart rate and make you sweat (moderate-intensity exercise).  Try to do strengthening exercises at least twice each week. Do these in addition to the moderate-intensity exercise.  Spend less time sitting. Even light physical activity can be beneficial. Other tips  Work with your health care provider to achieve or maintain a healthy weight.  Do not use any products that contain nicotine or tobacco, such as cigarettes, e-cigarettes, and chewing tobacco. If you need help quitting, ask your health care provider.  Know your numbers. Ask your health care provider to check your cholesterol and your blood sugar (glucose). Continue to have your blood tested as directed by your health care provider. Do I need screening for cancer? Depending on your health history and family  history, you may need to have cancer screening at different stages of your life. This may include screening for:  Breast cancer.  Cervical cancer.  Lung cancer.  Colorectal cancer. What is my risk for osteoporosis? After menopause, you may be at increased risk for osteoporosis. Osteoporosis is a condition in  which bone destruction happens more quickly than new bone creation. To help prevent osteoporosis or the bone fractures that can happen because of osteoporosis, you may take the following actions:  If you are 94-46 years old, get at least 1,000 mg of calcium and at least 600 mg of vitamin D per day.  If you are older than age 21 but younger than age 54, get at least 1,200 mg of calcium and at least 600 mg of vitamin D per day.  If you are older than age 36, get at least 1,200 mg of calcium and at least 800 mg of vitamin D per day. Smoking and drinking excessive alcohol increase the risk of osteoporosis. Eat foods that are rich in calcium and vitamin D, and do weight-bearing exercises several times each week as directed by your health care provider. How does menopause affect my mental health? Depression may occur at any age, but it is more common as you become older. Common symptoms of depression include:  Low or sad mood.  Changes in sleep patterns.  Changes in appetite or eating patterns.  Feeling an overall lack of motivation or enjoyment of activities that you previously enjoyed.  Frequent crying spells. Talk with your health care provider if you think that you are experiencing depression. General instructions See your health care provider for regular wellness exams and vaccines. This may include:  Scheduling regular health, dental, and eye exams.  Getting and maintaining your vaccines. These include: ? Influenza vaccine. Get this vaccine each year before the flu season begins. ? Pneumonia vaccine. ? Shingles vaccine. ? Tetanus, diphtheria, and pertussis (Tdap) booster vaccine. Your health care provider may also recommend other immunizations. Tell your health care provider if you have ever been abused or do not feel safe at home. Summary  Menopause is a normal process in which your ability to get pregnant comes to an end.  This condition causes hot flashes, night sweats,  decreased interest in sex, mood swings, headaches, or lack of sleep.  Treatment for this condition may include hormone replacement therapy.  Take actions to keep yourself healthy, including exercising regularly, eating a healthy diet, watching your weight, and checking your blood pressure and blood sugar levels.  Get screened for cancer and depression. Make sure that you are up to date with all your vaccines. This information is not intended to replace advice given to you by your health care provider. Make sure you discuss any questions you have with your health care provider. Document Revised: 05/24/2018 Document Reviewed: 05/24/2018 Elsevier Patient Education  2020 [employer].

## 2019-09-03 NOTE — Progress Notes (Signed)
[name] 1961-02-03 [id number]    History:    Presents for annual exam.  Last here 2016.  Supracervical hysterectomy on no HRT.  Normal Pap and mammogram history.  Overdue for mammogram last mammogram 2013.  2009 T score -0.9 at spine.  2013 benign colon polyp.  Primary care manages hypertension, hypercholesteremia and GERD.  Past medical history, past surgical history, family history and social history were all reviewed and documented in the EPIC chart.  History of back surgery and has a rod in her left leg from work injury.  2 daughters both doing well one grandson.  Numerous family members with hypertension.  ROS:  A ROS was performed and pertinent positives and negatives are included.  Exam:  Vitals:   09/03/19 1532  BP: 128/80  Weight: 206 lb (93.4 kg)  Height: 5\' 5"  (1.651 m)   Body mass index is 34.28 kg/m.   General appearance:  Normal Thyroid:  Symmetrical, normal in size, without palpable masses or nodularity. Respiratory  Auscultation:  Clear without wheezing or rhonchi Cardiovascular  Auscultation:  Regular rate, without rubs, murmurs or gallops  Edema/varicosities:  Not grossly evident Abdominal  Soft,nontender, without masses, guarding or rebound.  Liver/spleen:  No organomegaly noted  Hernia:  None appreciated  Skin  Inspection:  Grossly normal   Breasts: Examined lying and sitting.     Right: Without masses, retractions, discharge or axillary adenopathy.     Left: Without masses, retractions, discharge or axillary adenopathy. Gentitourinary   Inguinal/mons:  Normal without inguinal adenopathy  External genitalia:  Normal  BUS/Urethra/Skene's glands:  Normal  Vagina:  Normal  Cervix:  Normal  Uterus: Absent  Adnexa/parametria:     Rt: Without masses or tenderness.   Lt: Without masses or tenderness.  Anus and perineum: Normal  Digital rectal exam: Normal sphincter tone without palpated masses or tenderness  Assessment/Plan:  59 y.o. MWF G3 P2  for annual exam with no GYN complaints.  Supracervical hysterectomy for benign reasons Hypertension, hypercholesteremia-primary care manages labs and meds 2013 benign colon polyp 10-year follow-up Osteopenia without elevated FRAX Obesity Chronic back pain orthopedist managing  Plan: We will schedule DEXA when schedules mammogram at [facility].  Reviewed importance of SBEs, annual screening mammograms.  Self-care, leisure activities encouraged.  Decrease calorie/carbs, increase regular weightbearing/cardio type exercise and balance type exercise such as yoga.  Pap normal 2015, Pap with HR HPV typing.    [place], 3:55 PM 09/03/2019

## 2019-09-04 LAB — PAP, TP IMAGING W/ HPV RNA, RFLX HPV TYPE 16,18/45: HPV DNA High Risk: NOT DETECTED

## 2019-09-11 DIAGNOSIS — E875 Hyperkalemia: Secondary | ICD-10-CM | POA: Diagnosis not present

## 2019-09-11 DIAGNOSIS — E559 Vitamin D deficiency, unspecified: Secondary | ICD-10-CM | POA: Diagnosis not present

## 2019-09-11 DIAGNOSIS — N390 Urinary tract infection, site not specified: Secondary | ICD-10-CM | POA: Diagnosis not present

## 2019-09-11 DIAGNOSIS — I10 Essential (primary) hypertension: Secondary | ICD-10-CM | POA: Diagnosis not present

## 2019-09-11 DIAGNOSIS — E7800 Pure hypercholesterolemia, unspecified: Secondary | ICD-10-CM | POA: Diagnosis not present

## 2019-09-11 DIAGNOSIS — R7301 Impaired fasting glucose: Secondary | ICD-10-CM | POA: Diagnosis not present

## 2019-09-18 DIAGNOSIS — E1165 Type 2 diabetes mellitus with hyperglycemia: Secondary | ICD-10-CM | POA: Diagnosis not present

## 2019-09-18 DIAGNOSIS — N390 Urinary tract infection, site not specified: Secondary | ICD-10-CM | POA: Diagnosis not present

## 2019-09-18 DIAGNOSIS — Z0000 Encounter for general adult medical examination without abnormal findings: Secondary | ICD-10-CM | POA: Diagnosis not present

## 2019-10-18 DIAGNOSIS — I10 Essential (primary) hypertension: Secondary | ICD-10-CM | POA: Diagnosis not present

## 2019-10-18 DIAGNOSIS — E7800 Pure hypercholesterolemia, unspecified: Secondary | ICD-10-CM | POA: Diagnosis not present

## 2019-10-18 DIAGNOSIS — E1165 Type 2 diabetes mellitus with hyperglycemia: Secondary | ICD-10-CM | POA: Diagnosis not present

## 2019-10-29 DIAGNOSIS — M150 Primary generalized (osteo)arthritis: Secondary | ICD-10-CM | POA: Diagnosis not present

## 2019-10-29 DIAGNOSIS — M961 Postlaminectomy syndrome, not elsewhere classified: Secondary | ICD-10-CM | POA: Diagnosis not present

## 2019-10-29 DIAGNOSIS — G894 Chronic pain syndrome: Secondary | ICD-10-CM | POA: Diagnosis not present

## 2019-10-29 DIAGNOSIS — Z79891 Long term (current) use of opiate analgesic: Secondary | ICD-10-CM | POA: Diagnosis not present

## 2019-12-19 DIAGNOSIS — I10 Essential (primary) hypertension: Secondary | ICD-10-CM | POA: Diagnosis not present

## 2019-12-19 DIAGNOSIS — L239 Allergic contact dermatitis, unspecified cause: Secondary | ICD-10-CM | POA: Diagnosis not present

## 2019-12-27 DIAGNOSIS — M961 Postlaminectomy syndrome, not elsewhere classified: Secondary | ICD-10-CM | POA: Diagnosis not present

## 2019-12-27 DIAGNOSIS — G894 Chronic pain syndrome: Secondary | ICD-10-CM | POA: Diagnosis not present

## 2019-12-27 DIAGNOSIS — M150 Primary generalized (osteo)arthritis: Secondary | ICD-10-CM | POA: Diagnosis not present

## 2019-12-27 DIAGNOSIS — Z79891 Long term (current) use of opiate analgesic: Secondary | ICD-10-CM | POA: Diagnosis not present

## 2020-02-28 DIAGNOSIS — G894 Chronic pain syndrome: Secondary | ICD-10-CM | POA: Diagnosis not present

## 2020-02-28 DIAGNOSIS — M961 Postlaminectomy syndrome, not elsewhere classified: Secondary | ICD-10-CM | POA: Diagnosis not present

## 2020-02-28 DIAGNOSIS — M150 Primary generalized (osteo)arthritis: Secondary | ICD-10-CM | POA: Diagnosis not present

## 2020-02-28 DIAGNOSIS — Z79891 Long term (current) use of opiate analgesic: Secondary | ICD-10-CM | POA: Diagnosis not present

## 2020-04-15 DIAGNOSIS — B372 Candidiasis of skin and nail: Secondary | ICD-10-CM | POA: Diagnosis not present

## 2020-04-15 DIAGNOSIS — E7800 Pure hypercholesterolemia, unspecified: Secondary | ICD-10-CM | POA: Diagnosis not present

## 2020-04-15 DIAGNOSIS — D509 Iron deficiency anemia, unspecified: Secondary | ICD-10-CM | POA: Diagnosis not present

## 2020-04-15 DIAGNOSIS — E1165 Type 2 diabetes mellitus with hyperglycemia: Secondary | ICD-10-CM | POA: Diagnosis not present

## 2020-04-24 DIAGNOSIS — E7800 Pure hypercholesterolemia, unspecified: Secondary | ICD-10-CM | POA: Diagnosis not present

## 2020-04-24 DIAGNOSIS — I10 Essential (primary) hypertension: Secondary | ICD-10-CM | POA: Diagnosis not present

## 2020-04-24 DIAGNOSIS — E1165 Type 2 diabetes mellitus with hyperglycemia: Secondary | ICD-10-CM | POA: Diagnosis not present

## 2020-04-24 DIAGNOSIS — Z23 Encounter for immunization: Secondary | ICD-10-CM | POA: Diagnosis not present

## 2020-05-01 DIAGNOSIS — G894 Chronic pain syndrome: Secondary | ICD-10-CM | POA: Diagnosis not present

## 2020-05-01 DIAGNOSIS — M961 Postlaminectomy syndrome, not elsewhere classified: Secondary | ICD-10-CM | POA: Diagnosis not present

## 2020-05-01 DIAGNOSIS — M150 Primary generalized (osteo)arthritis: Secondary | ICD-10-CM | POA: Diagnosis not present

## 2020-05-01 DIAGNOSIS — Z79891 Long term (current) use of opiate analgesic: Secondary | ICD-10-CM | POA: Diagnosis not present

## 2020-06-05 DIAGNOSIS — M791 Myalgia, unspecified site: Secondary | ICD-10-CM | POA: Diagnosis not present

## 2020-06-05 DIAGNOSIS — I10 Essential (primary) hypertension: Secondary | ICD-10-CM | POA: Diagnosis not present

## 2020-06-05 DIAGNOSIS — E1165 Type 2 diabetes mellitus with hyperglycemia: Secondary | ICD-10-CM | POA: Diagnosis not present

## 2020-06-05 DIAGNOSIS — E7800 Pure hypercholesterolemia, unspecified: Secondary | ICD-10-CM | POA: Diagnosis not present

## 2020-06-11 DIAGNOSIS — I10 Essential (primary) hypertension: Secondary | ICD-10-CM | POA: Diagnosis not present

## 2020-07-13 ENCOUNTER — Inpatient Hospital Stay (HOSPITAL_COMMUNITY)
Admission: EM | Admit: 2020-07-13 | Discharge: 2020-07-16 | DRG: 177 | Disposition: A | Discharge status: Home / Self Care | Payer: BC Managed Care – PPO | Attending: Internal Medicine | Admitting: Internal Medicine

## 2020-07-13 ENCOUNTER — Emergency Department (HOSPITAL_COMMUNITY): Payer: BC Managed Care – PPO

## 2020-07-13 ENCOUNTER — Encounter (HOSPITAL_COMMUNITY): Payer: Self-pay | Admitting: Family Medicine

## 2020-07-13 DIAGNOSIS — Z885 Allergy status to narcotic agent status: Secondary | ICD-10-CM | POA: Diagnosis not present

## 2020-07-13 DIAGNOSIS — N289 Disorder of kidney and ureter, unspecified: Secondary | ICD-10-CM

## 2020-07-13 DIAGNOSIS — R29810 Facial weakness: Secondary | ICD-10-CM | POA: Diagnosis not present

## 2020-07-13 DIAGNOSIS — Z9049 Acquired absence of other specified parts of digestive tract: Secondary | ICD-10-CM

## 2020-07-13 DIAGNOSIS — F419 Anxiety disorder, unspecified: Secondary | ICD-10-CM | POA: Diagnosis present

## 2020-07-13 DIAGNOSIS — U071 COVID-19: Principal | ICD-10-CM | POA: Diagnosis present

## 2020-07-13 DIAGNOSIS — J9600 Acute respiratory failure, unspecified whether with hypoxia or hypercapnia: Secondary | ICD-10-CM

## 2020-07-13 DIAGNOSIS — E785 Hyperlipidemia, unspecified: Secondary | ICD-10-CM | POA: Diagnosis not present

## 2020-07-13 DIAGNOSIS — E1165 Type 2 diabetes mellitus with hyperglycemia: Secondary | ICD-10-CM | POA: Diagnosis not present

## 2020-07-13 DIAGNOSIS — Z833 Family history of diabetes mellitus: Secondary | ICD-10-CM

## 2020-07-13 DIAGNOSIS — G9340 Encephalopathy, unspecified: Secondary | ICD-10-CM | POA: Diagnosis present

## 2020-07-13 DIAGNOSIS — F17210 Nicotine dependence, cigarettes, uncomplicated: Secondary | ICD-10-CM | POA: Diagnosis not present

## 2020-07-13 DIAGNOSIS — Z8249 Family history of ischemic heart disease and other diseases of the circulatory system: Secondary | ICD-10-CM | POA: Diagnosis not present

## 2020-07-13 DIAGNOSIS — Z882 Allergy status to sulfonamides status: Secondary | ICD-10-CM

## 2020-07-13 DIAGNOSIS — E669 Obesity, unspecified: Secondary | ICD-10-CM | POA: Diagnosis not present

## 2020-07-13 DIAGNOSIS — Z6834 Body mass index (BMI) 34.0-34.9, adult: Secondary | ICD-10-CM | POA: Diagnosis not present

## 2020-07-13 DIAGNOSIS — G9341 Metabolic encephalopathy: Secondary | ICD-10-CM | POA: Diagnosis present

## 2020-07-13 DIAGNOSIS — T380X5A Adverse effect of glucocorticoids and synthetic analogues, initial encounter: Secondary | ICD-10-CM | POA: Diagnosis not present

## 2020-07-13 DIAGNOSIS — J1282 Pneumonia due to coronavirus disease 2019: Secondary | ICD-10-CM | POA: Diagnosis not present

## 2020-07-13 DIAGNOSIS — Z888 Allergy status to other drugs, medicaments and biological substances status: Secondary | ICD-10-CM

## 2020-07-13 DIAGNOSIS — R29818 Other symptoms and signs involving the nervous system: Secondary | ICD-10-CM | POA: Diagnosis not present

## 2020-07-13 DIAGNOSIS — J9601 Acute respiratory failure with hypoxia: Secondary | ICD-10-CM | POA: Diagnosis present

## 2020-07-13 DIAGNOSIS — I639 Cerebral infarction, unspecified: Secondary | ICD-10-CM | POA: Diagnosis not present

## 2020-07-13 DIAGNOSIS — I10 Essential (primary) hypertension: Secondary | ICD-10-CM | POA: Diagnosis not present

## 2020-07-13 DIAGNOSIS — K219 Gastro-esophageal reflux disease without esophagitis: Secondary | ICD-10-CM | POA: Diagnosis present

## 2020-07-13 DIAGNOSIS — J3489 Other specified disorders of nose and nasal sinuses: Secondary | ICD-10-CM | POA: Diagnosis not present

## 2020-07-13 DIAGNOSIS — Y92239 Unspecified place in hospital as the place of occurrence of the external cause: Secondary | ICD-10-CM | POA: Diagnosis not present

## 2020-07-13 DIAGNOSIS — G894 Chronic pain syndrome: Secondary | ICD-10-CM | POA: Diagnosis present

## 2020-07-13 DIAGNOSIS — D72810 Lymphocytopenia: Secondary | ICD-10-CM | POA: Diagnosis present

## 2020-07-13 DIAGNOSIS — E1169 Type 2 diabetes mellitus with other specified complication: Secondary | ICD-10-CM

## 2020-07-13 DIAGNOSIS — Z7984 Long term (current) use of oral hypoglycemic drugs: Secondary | ICD-10-CM | POA: Diagnosis not present

## 2020-07-13 DIAGNOSIS — Z79899 Other long term (current) drug therapy: Secondary | ICD-10-CM | POA: Diagnosis not present

## 2020-07-13 DIAGNOSIS — R404 Transient alteration of awareness: Secondary | ICD-10-CM | POA: Diagnosis not present

## 2020-07-13 DIAGNOSIS — R4182 Altered mental status, unspecified: Secondary | ICD-10-CM | POA: Diagnosis not present

## 2020-07-13 LAB — DIFFERENTIAL
Abs Immature Granulocytes: 0.02 10*3/uL (ref 0.00–0.07)
Basophils Absolute: 0 10*3/uL (ref 0.0–0.1)
Basophils Relative: 0 %
Eosinophils Absolute: 0 10*3/uL (ref 0.0–0.5)
Eosinophils Relative: 1 %
Immature Granulocytes: 1 %
Lymphocytes Relative: 15 %
Lymphs Abs: 0.6 10*3/uL — ABNORMAL LOW (ref 0.7–4.0)
Monocytes Absolute: 0.3 10*3/uL (ref 0.1–1.0)
Monocytes Relative: 7 %
Neutro Abs: 2.9 10*3/uL (ref 1.7–7.7)
Neutrophils Relative %: 76 %

## 2020-07-13 LAB — I-STAT VENOUS BLOOD GAS, ED
Acid-Base Excess: 5 mmol/L — ABNORMAL HIGH (ref 0.0–2.0)
Bicarbonate: 32 mmol/L — ABNORMAL HIGH (ref 20.0–28.0)
Calcium, Ion: 1.03 mmol/L — ABNORMAL LOW (ref 1.15–1.40)
HCT: 44 % (ref 36.0–46.0)
Hemoglobin: 15 g/dL (ref 12.0–15.0)
O2 Saturation: 33 %
Potassium: 4.9 mmol/L (ref 3.5–5.1)
Sodium: 137 mmol/L (ref 135–145)
TCO2: 34 mmol/L — ABNORMAL HIGH (ref 22–32)
pCO2, Ven: 56.6 mmHg (ref 44.0–60.0)
pH, Ven: 7.36 (ref 7.250–7.430)
pO2, Ven: 22 mmHg — CL (ref 32.0–45.0)

## 2020-07-13 LAB — CBC
HCT: 44.1 % (ref 36.0–46.0)
Hemoglobin: 14.5 g/dL (ref 12.0–15.0)
MCH: 29.9 pg (ref 26.0–34.0)
MCHC: 32.9 g/dL (ref 30.0–36.0)
MCV: 90.9 fL (ref 80.0–100.0)
Platelets: 163 10*3/uL (ref 150–400)
RBC: 4.85 MIL/uL (ref 3.87–5.11)
RDW: 14 % (ref 11.5–15.5)
WBC: 3.8 10*3/uL — ABNORMAL LOW (ref 4.0–10.5)
nRBC: 0 % (ref 0.0–0.2)

## 2020-07-13 LAB — PROCALCITONIN: Procalcitonin: 0.45 ng/mL

## 2020-07-13 LAB — I-STAT CHEM 8, ED
BUN: 17 mg/dL (ref 8–23)
Calcium, Ion: 1.13 mmol/L — ABNORMAL LOW (ref 1.15–1.40)
Chloride: 98 mmol/L (ref 98–111)
Creatinine, Ser: 1.1 mg/dL — ABNORMAL HIGH (ref 0.44–1.00)
Glucose, Bld: 142 mg/dL — ABNORMAL HIGH (ref 70–99)
HCT: 45 % (ref 36.0–46.0)
Hemoglobin: 15.3 g/dL — ABNORMAL HIGH (ref 12.0–15.0)
Potassium: 4.9 mmol/L (ref 3.5–5.1)
Sodium: 136 mmol/L (ref 135–145)
TCO2: 29 mmol/L (ref 22–32)

## 2020-07-13 LAB — COMPREHENSIVE METABOLIC PANEL
ALT: 34 U/L (ref 0–44)
AST: 38 U/L (ref 15–41)
Albumin: 3 g/dL — ABNORMAL LOW (ref 3.5–5.0)
Alkaline Phosphatase: 61 U/L (ref 38–126)
Anion gap: 10 (ref 5–15)
BUN: 14 mg/dL (ref 8–23)
CO2: 29 mmol/L (ref 22–32)
Calcium: 8.6 mg/dL — ABNORMAL LOW (ref 8.9–10.3)
Chloride: 97 mmol/L — ABNORMAL LOW (ref 98–111)
Creatinine, Ser: 1.27 mg/dL — ABNORMAL HIGH (ref 0.44–1.00)
GFR, Estimated: 48 mL/min — ABNORMAL LOW (ref >60)
Glucose, Bld: 154 mg/dL — ABNORMAL HIGH (ref 70–99)
Potassium: 4.9 mmol/L (ref 3.5–5.1)
Sodium: 136 mmol/L (ref 135–145)
Total Bilirubin: 0.6 mg/dL (ref 0.3–1.2)
Total Protein: 6.7 g/dL (ref 6.5–8.1)

## 2020-07-13 LAB — BRAIN NATRIURETIC PEPTIDE: B Natriuretic Peptide: 55.5 pg/mL (ref 0.0–100.0)

## 2020-07-13 LAB — LACTATE DEHYDROGENASE: LDH: 213 U/L — ABNORMAL HIGH (ref 98–192)

## 2020-07-13 LAB — APTT: aPTT: 21 seconds — ABNORMAL LOW (ref 24–36)

## 2020-07-13 LAB — PROTIME-INR
INR: 1 (ref 0.8–1.2)
Prothrombin Time: 12.9 seconds (ref 11.4–15.2)

## 2020-07-13 LAB — CBG MONITORING, ED: Glucose-Capillary: 134 mg/dL — ABNORMAL HIGH (ref 70–99)

## 2020-07-13 LAB — FERRITIN: Ferritin: 354 ng/mL — ABNORMAL HIGH (ref 11–307)

## 2020-07-13 LAB — LACTIC ACID, PLASMA: Lactic Acid, Venous: 0.9 mmol/L (ref 0.5–1.9)

## 2020-07-13 LAB — FIBRINOGEN: Fibrinogen: 690 mg/dL — ABNORMAL HIGH (ref 210–475)

## 2020-07-13 LAB — TRIGLYCERIDES: Triglycerides: 114 mg/dL (ref <150)

## 2020-07-13 LAB — SARS CORONAVIRUS 2 BY RT PCR (HOSPITAL ORDER, PERFORMED IN ~~LOC~~ HOSPITAL LAB): SARS Coronavirus 2: POSITIVE — AB

## 2020-07-13 LAB — D-DIMER, QUANTITATIVE: D-Dimer, Quant: 1.69 ug/mL-FEU — ABNORMAL HIGH (ref 0.00–0.50)

## 2020-07-13 LAB — C-REACTIVE PROTEIN: CRP: 16.9 mg/dL — ABNORMAL HIGH (ref <1.0)

## 2020-07-13 LAB — HIV ANTIBODY (ROUTINE TESTING W REFLEX): HIV Screen 4th Generation wRfx: NONREACTIVE

## 2020-07-13 MED ORDER — METHYLPREDNISOLONE SODIUM SUCC 125 MG IJ SOLR
60.0000 mg | Freq: Two times a day (BID) | INTRAMUSCULAR | Status: DC
  Administered 2020-07-13 – 2020-07-16 (×6): 60 mg via INTRAVENOUS
  Filled 2020-07-13 (×6): qty 2

## 2020-07-13 MED ORDER — SODIUM CHLORIDE 0.9 % IV SOLN
100.0000 mg | Freq: Every day | INTRAVENOUS | Status: DC
  Administered 2020-07-14 – 2020-07-16 (×3): 100 mg via INTRAVENOUS
  Filled 2020-07-13 (×3): qty 20

## 2020-07-13 MED ORDER — ONDANSETRON HCL 4 MG PO TABS: 4.0000 mg | ORAL_TABLET | Freq: Four times a day (QID) | ORAL | Status: DC | PRN

## 2020-07-13 MED ORDER — SODIUM CHLORIDE 0.9 % IV SOLN
200.0000 mg | Freq: Once | INTRAVENOUS | Status: AC
  Administered 2020-07-13: 200 mg via INTRAVENOUS
  Filled 2020-07-13: qty 200

## 2020-07-13 MED ORDER — ONDANSETRON HCL 4 MG/2ML IJ SOLN: 4.0000 mg | Freq: Four times a day (QID) | INTRAMUSCULAR | Status: DC | PRN

## 2020-07-13 MED ORDER — SODIUM CHLORIDE 0.9 % IV SOLN: 250.0000 mL | INTRAVENOUS | Status: DC | PRN

## 2020-07-13 MED ORDER — BARICITINIB 2 MG PO TABS
2.0000 mg | ORAL_TABLET | Freq: Every day | ORAL | Status: DC
  Filled 2020-07-13: qty 1

## 2020-07-13 MED ORDER — SODIUM CHLORIDE 0.9% FLUSH
3.0000 mL | Freq: Once | INTRAVENOUS | Status: AC
  Administered 2020-07-13: 3 mL via INTRAVENOUS

## 2020-07-13 MED ORDER — ENOXAPARIN SODIUM 40 MG/0.4ML ~~LOC~~ SOLN
40.0000 mg | Freq: Every day | SUBCUTANEOUS | Status: DC
  Administered 2020-07-13 – 2020-07-16 (×4): 40 mg via SUBCUTANEOUS
  Filled 2020-07-13 (×4): qty 0.4

## 2020-07-13 MED ORDER — PANTOPRAZOLE SODIUM 40 MG PO TBEC
40.0000 mg | DELAYED_RELEASE_TABLET | Freq: Every day | ORAL | Status: DC
  Administered 2020-07-13 – 2020-07-16 (×4): 40 mg via ORAL
  Filled 2020-07-13 (×4): qty 1

## 2020-07-13 MED ORDER — OXYCODONE HCL ER 10 MG PO T12A
20.0000 mg | EXTENDED_RELEASE_TABLET | Freq: Two times a day (BID) | ORAL | Status: DC
  Administered 2020-07-13 – 2020-07-16 (×6): 20 mg via ORAL
  Filled 2020-07-13 (×6): qty 2

## 2020-07-13 MED ORDER — METHYLPREDNISOLONE SODIUM SUCC 125 MG IJ SOLR
50.0000 mg | Freq: Two times a day (BID) | INTRAMUSCULAR | Status: DC
  Administered 2020-07-13: 50 mg via INTRAVENOUS
  Filled 2020-07-13: qty 2

## 2020-07-13 MED ORDER — PREDNISONE 20 MG PO TABS: 50.0000 mg | ORAL_TABLET | Freq: Every day | ORAL | Status: DC

## 2020-07-13 MED ORDER — ACETAMINOPHEN 325 MG PO TABS
650.0000 mg | ORAL_TABLET | Freq: Four times a day (QID) | ORAL | Status: DC | PRN
  Administered 2020-07-14: 650 mg via ORAL
  Filled 2020-07-13 (×2): qty 2

## 2020-07-13 NOTE — ED Notes (Signed)
Pt transported to CT scan.

## 2020-07-13 NOTE — Progress Notes (Signed)
PROGRESS NOTE                                                                             PROGRESS NOTE                                                                                                                                                                                                             Patient Demographics:    [name], is a 60 y.o. female, DOB - 11-03-1960, SWF:[id number]  Outpatient Primary MD for the patient is Deland Pretty, MD    LOS - 0  Admit date - 07/13/2020    No chief complaint on file.      Brief Narrative    This is a no charge note as patient was seen and admitted earlier today, imaging, labs and chart were reviewed, patient was seen and examined.  HPI: [name] is a 60 y.o. female with medical history significant for hypertension, chronic pain, and anxiety, now presenting to the emergency department after she became poorly responsive at home. Patient reportedly tested positive for COVID-19 at home on 07/07/2020, has been experiencing fatigue, and general malaise, fevers, and shortness of breath. Her husband went to get her some Tylenol for fever and then found her to be unresponsive. He called EMS and patient was found to have an oxygen saturation of 50%. She was placed on 15 L/min of supplemental oxygen and brought into the ED. Patient is waking up in the emergency department and reports that her primary symptom over the past week has been fatigue but she also has some general aches and malaise and shortness of breath. She denies chest pain, headache, leg swelling or tenderness, or focal numbness or weakness.  ED Course: Upon arrival to the ED, patient is found to be febrile to 38.3 C, saturating mid 90s on 6 L/min of supplemental oxygen, slightly tachypneic, and with stable blood pressure. EKG features sinus rhythm. Noncontrast head CT negative for acute intracranial abnormality. Chest x-ray notable for  interval development of  bilateral patchy airspace opacities. Chemistry panel features a creatinine 1.27. CBC notable for a WBC of 3800 with ALC of 570. Blood cultures were collected in the ED and the patient was started on supplemental oxygen and remdesivir. Confirmatory Covid test is pending.   Subjective:    [name] today ports she is feeling better, her dyspnea has improved.   Assessment  & Plan :    Principal Problem:   Acute respiratory failure due to COVID-19 [facility]) Active Problems:   SYNDROME, CHRONIC PAIN   Essential hypertension   Anxiety   Acute encephalopathy   Renal insufficiency  Acute Hypoxic Resp. Failure due to Acute Covid 19 Viral Pneumonitis during the ongoing 2020 Covid 19 Pandemic  -reports she Vaccinated with Moderna, but did not receive a booster - Patient reportedly tested positive for COVID at home on 1/24, has had fatigue, fevers, and malaise, became poorly responsive tonight, was saturating 50% with EMS, and has new patchy airspace opacities on CXR  - Remains on 6 L nasal cannula. -Continue with IV Solu-Medrol. -Continue with remdesivir -Will be started on baricitini -Encouraged the patient to sit up in chair in the daytime use I-S and flutter valve for pulmonary toiletry and then prone in bed when at night.  Will advance activity and titrate down oxygen as possible.  Actemra/Baricitinib  off label use - patient was told that if COVID-19 pneumonitis gets worse we might potentially use Actemra off label, patient denies any known history of active diverticulitis, tuberculosis or hepatitis, understands the risks and benefits and wants to proceed with Actemra treatment if required.     SpO2: 92 % O2 Flow Rate (L/min): 6 L/min  Recent Labs  Lab 07/13/20 0208 07/13/20 0306 07/13/20 0330 07/13/20 0436 07/13/20 0609  WBC 3.8*  --   --   --   --   PLT 163  --   --   --   --   CRP  --  16.9*  --   --   --   BNP  --   --   --   --  55.5  DDIMER  --   1.69*  --   --   --   PROCALCITON  --  0.45  --   --   --   AST 38  --   --   --   --   ALT 34  --   --   --   --   ALKPHOS 61  --   --   --   --   BILITOT 0.6  --   --   --   --   ALBUMIN 3.0*  --   --   --   --   INR 1.0  --   --   --   --   LATICACIDVEN  --   --   --  0.9  --   SARSCOV2NAA  --   --  POSITIVE*  --   --        ABG     Component Value Date/Time   HCO3 32.0 (H) 07/13/2020 0220   TCO2 34 (H) 07/13/2020 0220   O2SAT 33.0 07/13/2020 0220     Acute encephalopathy  - resolved.  Anxiety  - Holding sedating medications until she is more alert    Chronic pain  - No pain complaints on admission, hold sedating medications until she is more alert     Hypertension  - BP at goal, treat as-needed  only for now    Renal insufficiency  - SCr is 1.27 on admission, up from 0.9 in [id number]  - Uncertain chronicity, hold lisinopril for now and monitor    Lymphocytopenia  - ALC is 570 on admission  - Likely due to COVID  - Check HIV, monitor     Condition - Extremely Guarded  Code Status :  full  Consults  :  none  Disposition Plan  :    Status is: Inpatient  Remains inpatient appropriate because:IV treatments appropriate due to intensity of illness or inability to take PO   Dispo: The patient is from: Home              Anticipated d/c is to: Home              Anticipated d/c date is: 3 days              Patient currently is not medically stable to d/c.   Difficult to place patient No      DVT Prophylaxis  :  Lovenox  Lab Results  Component Value Date   PLT 163 07/13/2020    Diet :  Diet Order            Diet Heart Room service appropriate? Yes; Fluid consistency: Thin  Diet effective now                  Inpatient Medications  Scheduled Meds: . enoxaparin (LOVENOX) injection  40 mg Subcutaneous Daily  . methylPREDNISolone (SOLU-MEDROL) injection  60 mg Intravenous BID  . pantoprazole  40 mg Oral Daily   Continuous Infusions: .  sodium chloride    . [START ON 07/14/2020] remdesivir 100 mg in NS 100 mL     PRN Meds:.sodium chloride, acetaminophen, ondansetron **OR** ondansetron (ZOFRAN) IV  Antibiotics  :    Anti-infectives (From admission, onward)   Start     Dose/Rate Route Frequency Ordered Stop   07/14/20 1000  remdesivir 100 mg in sodium chloride 0.9 % 100 mL IVPB       "Followed by" Linked Group Details   100 mg 200 mL/hr over 30 Minutes Intravenous Daily 07/13/20 0332 07/18/20 0959   07/13/20 0400  remdesivir 200 mg in sodium chloride 0.9% 250 mL IVPB       "Followed by" Linked Group Details   200 mg 580 mL/hr over 30 Minutes Intravenous Once 07/13/20 0332 07/13/20 0429        Emeline Gins Quran Vasco M.D on 07/13/2020 at 4:09 PM  To page go to [email or web address]  [facility] -  Office  [phone]     Objective:   Vitals:   07/13/20 1130 07/13/20 1215 07/13/20 1300 07/13/20 1400  BP: 109/68 122/72 103/70 (!) 104/54  Pulse: 72 74 70 71  Resp: 14 13 17 12   Temp:      TempSrc:      SpO2: 96% 94% 96% 92%  Weight:        Wt Readings from Last 3 Encounters:  07/13/20 93.4 kg  09/03/19 93.4 kg  04/23/15 82.6 kg    No intake or output data in the 24 hours ending 07/13/20 1609   Physical Exam  Awake Alert, No new F.N deficits, Normal affect Symmetrical Chest wall movement, Good air movement bilaterally, CTAB RRR,No Gallops,Rubs or new Murmurs, No Parasternal Heave +ve B.Sounds, Abd Soft, No tenderness, No organomegaly appriciated, No rebound - guarding or rigidity. No Cyanosis, Clubbing or edema, No new Rash or bruise  Data Review:    CBC Recent Labs  Lab 07/13/20 0208 07/13/20 0215 07/13/20 0220  WBC 3.8*  --   --   HGB 14.5 15.3* 15.0  HCT 44.1 45.0 44.0  PLT 163  --   --   MCV 90.9  --   --   MCH 29.9  --   --   MCHC 32.9  --   --   RDW 14.0  --   --   LYMPHSABS 0.6*  --   --   MONOABS 0.3  --   --   EOSABS 0.0  --   --   BASOSABS 0.0  --   --     Recent Labs   Lab 07/13/20 0208 07/13/20 0215 07/13/20 0220 07/13/20 0306 07/13/20 0436 07/13/20 0609  NA 136 136 137  --   --   --   K 4.9 4.9 4.9  --   --   --   CL 97* 98  --   --   --   --   CO2 29  --   --   --   --   --   GLUCOSE 154* 142*  --   --   --   --   BUN 14 17  --   --   --   --   CREATININE 1.27* 1.10*  --   --   --   --   CALCIUM 8.6*  --   --   --   --   --   AST 38  --   --   --   --   --   ALT 34  --   --   --   --   --   ALKPHOS 61  --   --   --   --   --   BILITOT 0.6  --   --   --   --   --   ALBUMIN 3.0*  --   --   --   --   --   CRP  --   --   --  16.9*  --   --   DDIMER  --   --   --  1.69*  --   --   PROCALCITON  --   --   --  0.45  --   --   LATICACIDVEN  --   --   --   --  0.9  --   INR 1.0  --   --   --   --   --   BNP  --   --   --   --   --  55.5    ------------------------------------------------------------------------------------------------------------------ Recent Labs    07/13/20 0306  TRIG 114    Lab Results  Component Value Date   HGBA1C 6.2 08/06/2013   ------------------------------------------------------------------------------------------------------------------ No results for input(s): TSH, T4TOTAL, T3FREE, THYROIDAB in the last 72 hours.  Invalid input(s): FREET3  Cardiac Enzymes No results for input(s): CKMB, TROPONINI, MYOGLOBIN in the last 168 hours.  Invalid input(s): CK ------------------------------------------------------------------------------------------------------------------    Component Value Date/Time   BNP 55.5 07/13/2020 0609    Micro Results Recent Results (from the past 240 hour(s))  Blood Culture (routine x 2)     Status: None (Preliminary result)   Collection Time: 07/13/20  3:06 AM   Specimen: BLOOD  Result Value Ref Range Status   Specimen Description BLOOD SITE NOT SPECIFIED  Final   Special Requests  Final    BOTTLES DRAWN AEROBIC AND ANAEROBIC Blood Culture results may not be optimal due to an  excessive volume of blood received in culture bottles   Culture   Final    NO GROWTH < 12 HOURS Performed at [facility] [address], [place], [place] [address]    Report Status PENDING  Incomplete  Blood Culture (routine x 2)     Status: None (Preliminary result)   Collection Time: 07/13/20  3:07 AM   Specimen: BLOOD  Result Value Ref Range Status   Specimen Description BLOOD SITE NOT SPECIFIED  Final   Special Requests   Final    BOTTLES DRAWN AEROBIC ONLY Blood Culture results may not be optimal due to an inadequate volume of blood received in culture bottles   Culture   Final    NO GROWTH < 12 HOURS Performed at [facility], [address], [place], [place] [address]    Report Status PENDING  Incomplete  SARS Coronavirus 2 by RT PCR (hospital order, performed in [facility] hospital lab) Nasopharyngeal Nasopharyngeal Swab     Status: Abnormal   Collection Time: 07/13/20  3:30 AM   Specimen: Nasopharyngeal Swab  Result Value Ref Range Status   SARS Coronavirus 2 POSITIVE (A) NEGATIVE Final    Comment: RESULT CALLED TO, READ BACK BY AND VERIFIED WITH: K. ISLEY,RN 0501 07/13/2020 T. TYSOR (NOTE) SARS-CoV-2 target nucleic acids are DETECTED  SARS-CoV-2 RNA is generally detectable in upper respiratory specimens  during the acute phase of infection.  Positive results are indicative  of the presence of the identified virus, but do not rule out bacterial infection or co-infection with other pathogens not detected by the test.  Clinical correlation with patient history and  other diagnostic information is necessary to determine patient infection status.  The expected result is negative.  Fact Sheet for Patients:   StrictlyIdeas.no   Fact Sheet for Healthcare Providers:   BankingDealers.co.za    This test is not yet approved or cleared by the [place] FDA and  has been authorized for detection and/or diagnosis  of SARS-CoV-2 by FDA under an Emergency Use Authorization (EUA).  This EUA will remain in effect (meaning this  test can be used) for the duration of  the COVID-19 declaration under Section 564(b)(1) of the Act, 21 U.S.C. section 360-bbb-3(b)(1), unless the authorization is terminated or revoked sooner.  Performed at [facility], [place] [address], [place], [place] [address]     Radiology Reports DG Chest [place] 1 View  Result Date: 07/13/2020 CLINICAL DATA:  Stroke EXAM: PORTABLE CHEST 1 VIEW.  Patient is rotated. COMPARISON:  Chest x-ray 08/10/2010 FINDINGS: Prominent cardiac silhouette which may be due to AP portable technique. Otherwise the heart size and mediastinal contours are unchanged. Poor delineation of the ascending aortic arch may be due to rotation. Interval development of bilateral lower lung zone patchy airspace opacities. No pulmonary edema. No pleural effusion. No pneumothorax. No acute osseous abnormality. Suggestion of right neck atherosclerotic plaque. IMPRESSION: 1. Interval development of bilateral lower lung zone patchy airspace opacities could represent infection versus inflammation (aspiration pneumonia). COVID 19 infection not excluded. 2. Poor delineation of the ascending aortic arch may be due to rotation. Consider repeat chest x-ray. Electronically Signed   By: Iven Finn M.D.   On: 07/13/2020 03:07   CT HEAD CODE STROKE WO CONTRAST  Result Date: 07/13/2020 CLINICAL DATA:  Code stroke. Initial evaluation for acute unresponsiveness.  EXAM: CT HEAD WITHOUT CONTRAST TECHNIQUE: Contiguous axial images were obtained from the base of the skull through the vertex without intravenous contrast. COMPARISON:  None. FINDINGS: Brain: Cerebral volume within normal limits. No acute intracranial hemorrhage. No acute large vessel territory infarct. No mass lesion, midline shift or mass effect. No hydrocephalus or extra-axial fluid collection. Vascular: No hyperdense vessel.  Skull: Scalp soft tissues and calvarium within normal limits. Sinuses/Orbits: Globes and orbital soft tissues within normal limits. Scattered mucosal thickening noted throughout the paranasal sinuses. No mastoid effusion. Other: None. ASPECTS [facility] Stroke Program Early CT Score) - Ganglionic level infarction (caudate, lentiform nuclei, internal capsule, insula, M1-M3 cortex): 7 - Supraganglionic infarction (M4-M6 cortex): 3 Total score (0-10 with 10 being normal): 10 IMPRESSION: 1. No acute intracranial infarct or other abnormality. 2. ASPECTS is 10. These results were communicated to Dr. Cheral Marker at 2:34 amon 1/30/2022by text page via the [facility] messaging system. Electronically Signed   By: Jeannine Boga M.D.   On: 07/13/2020 02:34

## 2020-07-13 NOTE — ED Notes (Signed)
Meal tray given to patient.

## 2020-07-13 NOTE — Progress Notes (Signed)
Report received from ED RN.   Asked RN to give oxy that was due at [place] before sending patient up, d/t staff having to move her around when she gets to the unit. Per RN she will give it.

## 2020-07-13 NOTE — ED Notes (Signed)
Date and time results received: 07/13/20 0501 (use smartphrase ".now" to insert current time)  Test: Covid Critical Value: Positive  Name of Provider Notified: Opyd  Orders Received? Or Actions Taken?: No orders received.

## 2020-07-13 NOTE — H&P (Signed)
History and Physical    [name] UQJ:[id number] DOB: [birth year] DOA: 07/13/2020  PCP: Deland Pretty, MD   Patient coming from: Home   Chief Complaint: COVID positive, poorly responsive   HPI: [name] is a 60 y.o. female with medical history significant for hypertension, chronic pain, and anxiety, now presenting to the emergency department after she became poorly responsive at home. Patient reportedly tested positive for COVID-19 at home on 07/07/2020, has been experiencing fatigue, and general malaise, fevers, and shortness of breath. Her husband went to get her some Tylenol for fever and then found her to be unresponsive. He called EMS and patient was found to have an oxygen saturation of 50%. She was placed on 15 L/min of supplemental oxygen and brought into the ED. Patient is waking up in the emergency department and reports that her primary symptom over the past week has been fatigue but she also has some general aches and malaise and shortness of breath. She denies chest pain, headache, leg swelling or tenderness, or focal numbness or weakness.  ED Course: Upon arrival to the ED, patient is found to be febrile to 38.3 C, saturating mid 90s on 6 L/min of supplemental oxygen, slightly tachypneic, and with stable blood pressure. EKG features sinus rhythm. Noncontrast head CT negative for acute intracranial abnormality. Chest x-ray notable for interval development of bilateral patchy airspace opacities. Chemistry panel features a creatinine 1.27. CBC notable for a WBC of 3800 with ALC of 570. Blood cultures were collected in the ED and the patient was started on supplemental oxygen and remdesivir. Confirmatory Covid test is pending.  Review of Systems:  All other systems reviewed and apart from HPI, are negative.  Past Medical History:  Diagnosis Date  . Arthritis   . Bladder spasms   . GERD (gastroesophageal reflux disease)   . Hyperlipidemia   . Hypertension     Past  Surgical History:  Procedure Laterality Date  . [place], 2006  . BREAST LUMPECTOMY  1976   bi-lat , both benign   . CHOLECYSTECTOMY  1998  . LEG SURGERY  2009   work related injury  . PARTIAL HYSTERECTOMY      Social History:   reports that she has been smoking cigarettes. She has a 37.00 pack-year smoking history. She has never used smokeless tobacco. She reports that she does not drink alcohol and does not use drugs.  Allergies  Allergen Reactions  . Codeine Phosphate     REACTION: unspecified  . Simvastatin     REACTION: muscle \\T \ joint pains  . Sulfamethoxazole     REACTION: unspecified    Family History  Problem Relation Age of Onset  . Diabetes Mother   . Hypertension Mother   . Heart disease Mother   . Hypertension Father   . Diabetes Father   . Heart disease Father   . Neuropathy Neg Hx      Prior to Admission medications   Medication Sig Start Date End Date Taking? Authorizing Provider  ALPRAZolam (XANAX) 0.25 MG tablet Take 0.25 mg by mouth 2 (two) times daily as needed. 02/20/15   [provider]  Cholecalciferol (VITAMIN D) 2000 UNITS tablet Take 2,000 Units by mouth daily.    [provider]  dexlansoprazole (DEXILANT) 60 MG capsule Take 60 mg by mouth daily.    [provider]  estradiol (ESTRACE) 2 MG tablet Take 1 tablet (2 mg total) by mouth daily. 10/08/15   Young,  Candiss Norse, NP  Lido-Capsaicin-Men-Methyl Sal (MEDI-PATCH-LIDOCAINE) 0.5-0.035-5-20 % PTCH Apply 1 patch topically.    [provider]  lidocaine (LIDODERM) 5 % Place 3 patches onto the skin daily. Remove & Discard patch within 12 hours or as directed by MD 04/23/15   Melvenia Beam, MD  lisinopril (PRINIVIL,ZESTRIL) 5 MG tablet TAKE 1 TABLET BY MOUTH EVERY DAY 08/29/13   Swords, Darrick Penna, MD  loratadine (CLARITIN) 10 MG tablet Take 10 mg by mouth 2 (two) times daily as needed. For allergies    [provider]  nitroGLYCERIN (NITRODUR -  DOSED IN MG/24 HR) 0.2 mg/hr patch Place 1 patch onto the skin. 03/27/15   [provider]  OXYCONTIN 40 MG 12 hr tablet Take 40 mg by mouth every 8 (eight) hours as needed. 04/17/15   [provider]  pregabalin (LYRICA) 75 MG capsule Take 75 mg by mouth 3 (three) times daily.     [provider]  varenicline (CHANTIX) 0.5 MG tablet Take 0.5 mg by mouth 2 (two) times daily.    [provider]  vitamin B-12 (CYANOCOBALAMIN) 1000 MCG tablet Take 1,000 mcg by mouth daily.    [provider]    Physical Exam: Vitals:   07/13/20 0225  BP: 114/78  Pulse: 99  Resp: (!) 21  Temp: (!) 101 F (38.3 C)  TempSrc: Oral  SpO2: 96%    Constitutional: NAD, calm  Eyes: PERTLA, lids and conjunctivae normal ENMT: Mucous membranes are moist. Posterior pharynx clear of any exudate or lesions.   Neck: normal, supple, no masses, no thyromegaly Respiratory: Fine rales bilaterally. No accessory muscle use.  Cardiovascular: S1 & S2 heard, regular rate and rhythm. No extremity edema.  Abdomen: No distension, no tenderness, soft. Bowel sounds active.  Musculoskeletal: no clubbing / cyanosis. No joint deformity upper and lower extremities.   Skin: no significant rashes, lesions, ulcers. Warm, dry, well-perfused. Neurologic: CN 2-12 grossly intact. Sensation intact. Strength 5/5 in all 4 limbs.  Psychiatric: Somnolent, wakes to voice, oriented to person only initially but later able to describe recent events. Pleasant and cooperative.    Labs and Imaging on Admission: I have personally reviewed following labs and imaging studies  CBC: Recent Labs  Lab 07/13/20 0208 07/13/20 0215 07/13/20 0220  WBC 3.8*  --   --   NEUTROABS 2.9  --   --   HGB 14.5 15.3* 15.0  HCT 44.1 45.0 44.0  MCV 90.9  --   --   PLT 163  --   --    Basic Metabolic Panel: Recent Labs  Lab 07/13/20 0208 07/13/20 0215 07/13/20 0220  NA 136 136 137  K 4.9 4.9 4.9  CL 97* 98  --    CO2 29  --   --   GLUCOSE 154* 142*  --   BUN 14 17  --   CREATININE 1.27* 1.10*  --   CALCIUM 8.6*  --   --    GFR: CrCl cannot be calculated (Unknown ideal weight.). Liver Function Tests: Recent Labs  Lab 07/13/20 0208  AST 38  ALT 34  ALKPHOS 61  BILITOT 0.6  PROT 6.7  ALBUMIN 3.0*   No results for input(s): LIPASE, AMYLASE in the last 168 hours. No results for input(s): AMMONIA in the last 168 hours. Coagulation Profile: Recent Labs  Lab 07/13/20 0208  INR 1.0   Cardiac Enzymes: No results for input(s): CKTOTAL, CKMB, CKMBINDEX, TROPONINI in the last 168 hours. BNP (last  3 results) No results for input(s): PROBNP in the last 8760 hours. HbA1C: No results for input(s): HGBA1C in the last 72 hours. CBG: Recent Labs  Lab 07/13/20 0209  GLUCAP 134*   Lipid Profile: Recent Labs    07/13/20 0306  TRIG 114   Thyroid Function Tests: No results for input(s): TSH, T4TOTAL, FREET4, T3FREE, THYROIDAB in the last 72 hours. Anemia Panel: No results for input(s): VITAMINB12, FOLATE, FERRITIN, TIBC, IRON, RETICCTPCT in the last 72 hours. Urine analysis:    Component Value Date/Time   COLORURINE YELLOW 05/22/2012 1101   APPEARANCEUR CLEAR 05/22/2012 1101   LABSPEC 1.008 05/22/2012 1101   PHURINE 7.0 05/22/2012 1101   GLUCOSEU NEG 05/22/2012 1101   HGBUR NEG 05/22/2012 1101   HGBUR negative 08/05/2009 1114   BILIRUBINUR NEG 05/22/2012 1101   KETONESUR NEG 05/22/2012 1101   PROTEINUR NEG 05/22/2012 1101   UROBILINOGEN 0.2 05/22/2012 1101   NITRITE NEG 05/22/2012 1101   LEUKOCYTESUR NEG 05/22/2012 1101   Sepsis Labs: @LABRCNTIP (procalcitonin:4,lacticidven:4) )No results found for this or any previous visit (from the past 240 hour(s)).   Radiological Exams on Admission: DG Chest Port 1 View  Result Date: 07/13/2020 CLINICAL DATA:  Stroke EXAM: PORTABLE CHEST 1 VIEW.  Patient is rotated. COMPARISON:  Chest x-ray 08/10/2010 FINDINGS: Prominent cardiac  silhouette which may be due to AP portable technique. Otherwise the heart size and mediastinal contours are unchanged. Poor delineation of the ascending aortic arch may be due to rotation. Interval development of bilateral lower lung zone patchy airspace opacities. No pulmonary edema. No pleural effusion. No pneumothorax. No acute osseous abnormality. Suggestion of right neck atherosclerotic plaque. IMPRESSION: 1. Interval development of bilateral lower lung zone patchy airspace opacities could represent infection versus inflammation (aspiration pneumonia). COVID 19 infection not excluded. 2. Poor delineation of the ascending aortic arch may be due to rotation. Consider repeat chest x-ray. Electronically Signed   By: Iven Finn M.D.   On: 07/13/2020 03:07   CT HEAD CODE STROKE WO CONTRAST  Result Date: 07/13/2020 CLINICAL DATA:  Code stroke. Initial evaluation for acute unresponsiveness. EXAM: CT HEAD WITHOUT CONTRAST TECHNIQUE: Contiguous axial images were obtained from the base of the skull through the vertex without intravenous contrast. COMPARISON:  None. FINDINGS: Brain: Cerebral volume within normal limits. No acute intracranial hemorrhage. No acute large vessel territory infarct. No mass lesion, midline shift or mass effect. No hydrocephalus or extra-axial fluid collection. Vascular: No hyperdense vessel. Skull: Scalp soft tissues and calvarium within normal limits. Sinuses/Orbits: Globes and orbital soft tissues within normal limits. Scattered mucosal thickening noted throughout the paranasal sinuses. No mastoid effusion. Other: None. ASPECTS [facility] Stroke Program Early CT Score) - Ganglionic level infarction (caudate, lentiform nuclei, internal capsule, insula, M1-M3 cortex): 7 - Supraganglionic infarction (M4-M6 cortex): 3 Total score (0-10 with 10 being normal): 10 IMPRESSION: 1. No acute intracranial infarct or other abnormality. 2. ASPECTS is 10. These results were communicated to Dr. Cheral Marker  at 2:34 amon 1/30/2022by text page via the [facility] messaging system. Electronically Signed   By: Jeannine Boga M.D.   On: 07/13/2020 02:34    EKG: Independently reviewed. Sinus rhythm.   Assessment/Plan  1. Acute hypoxic respiratory failure suspected secondary to COVID-19  - Patient reportedly tested positive for COVID at home on 1/24, has had fatigue, fevers, and malaise, became poorly responsive tonight, was saturating 50% with EMS, and has new patchy airspace opacities on CXR  - Confirmatory COVID test pending  - She was started on  supplemental O2 and remdesivir in ED  - Continue remdesivir, start systemic steroids, check procalcitonin, CRP, and d-dimer, continue supplemental O2    2. Acute encephalopathy  - Presents after an episode of unresponsiveness, was saturating 50% with EMS, had no acute findings on head CT and no focal neuro deficits on exam, and is improving with supplemental O2  - She remains somnolent but wakes easily and is able to provide some history by time of admission  - Likely due to profound hypoxia, medications (benzodiazepines and opiates) may have been contributing  - Hold sedating medications for now, continue supplemental O2, and extend workup if she does not continue to improve   3. Anxiety  - Holding sedating medications until she is more alert    4. Chronic pain  - No pain complaints on admission, hold sedating medications until she is more alert    5. Hypertension  - BP at goal, treat as-needed only for now    6. Renal insufficiency  - SCr is 1.27 on admission, up from 0.9 in [id number]  - Uncertain chronicity, hold lisinopril for now and monitor    7. Lymphocytopenia  - ALC is 570 on admission  - Likely due to COVID  - Check HIV, monitor   DVT prophylaxis: Lovenox  Code Status: Full  Level of Care: Level of care: Progressive Family Communication: Husband updated from ED  Disposition Plan:  Patient is from: home  Anticipated d/c is to:  TBD Anticipated d/c date is: 07/17/20 Patient currently: Pending improvement in respiratory and mental status Consults called: None  Admission status: Inpatient     Vianne Bulls, MD [facility]  07/13/2020, 4:23 AM

## 2020-07-13 NOTE — ED Notes (Signed)
Pt's spouse updated on current status. Pt s/w spouse on the phone.

## 2020-07-13 NOTE — ED Notes (Signed)
Dr.Mesner and neurologist advised that code stroke can be cancelled at this time.

## 2020-07-13 NOTE — ED Notes (Signed)
2nd attempt at report. RN states she has to call back.

## 2020-07-13 NOTE — ED Notes (Signed)
3rd attempt at report. Was told RN is in urgent situation and cannot take report at this time.

## 2020-07-13 NOTE — ED Notes (Signed)
Attempted report. Will try again in 44min

## 2020-07-13 NOTE — ED Triage Notes (Signed)
EMS reports that pt is from home. Was diagnosed with covid on 01/24 - Husband reported pt was normal around [phone]. Approximately 2 hours later, pt was not responding to him. On EMS arrival - pt was 50% on RA. Pt was not responding. No verbal response.  Placed on nonrebreather at 15lpm. O2 increased to 96%. BP - 130/90, HR - 110, CBG - 140.

## 2020-07-14 ENCOUNTER — Other Ambulatory Visit: Payer: Self-pay

## 2020-07-14 DIAGNOSIS — G9340 Encephalopathy, unspecified: Secondary | ICD-10-CM

## 2020-07-14 LAB — COMPREHENSIVE METABOLIC PANEL
ALT: 32 U/L (ref 0–44)
AST: 36 U/L (ref 15–41)
Albumin: 2.5 g/dL — ABNORMAL LOW (ref 3.5–5.0)
Alkaline Phosphatase: 45 U/L (ref 38–126)
Anion gap: 10 (ref 5–15)
BUN: 17 mg/dL (ref 8–23)
CO2: 29 mmol/L (ref 22–32)
Calcium: 9 mg/dL (ref 8.9–10.3)
Chloride: 98 mmol/L (ref 98–111)
Creatinine, Ser: 0.98 mg/dL (ref 0.44–1.00)
GFR, Estimated: >60 mL/min (ref >60)
Glucose, Bld: 214 mg/dL — ABNORMAL HIGH (ref 70–99)
Potassium: 4.8 mmol/L (ref 3.5–5.1)
Sodium: 137 mmol/L (ref 135–145)
Total Bilirubin: 0.5 mg/dL (ref 0.3–1.2)
Total Protein: 5.9 g/dL — ABNORMAL LOW (ref 6.5–8.1)

## 2020-07-14 LAB — MRSA PCR SCREENING: MRSA by PCR: NEGATIVE

## 2020-07-14 LAB — CBC WITH DIFFERENTIAL/PLATELET
Abs Immature Granulocytes: 0.03 10*3/uL (ref 0.00–0.07)
Basophils Absolute: 0 10*3/uL (ref 0.0–0.1)
Basophils Relative: 0 %
Eosinophils Absolute: 0 10*3/uL (ref 0.0–0.5)
Eosinophils Relative: 0 %
HCT: 38.9 % (ref 36.0–46.0)
Hemoglobin: 12.9 g/dL (ref 12.0–15.0)
Immature Granulocytes: 1 %
Lymphocytes Relative: 9 %
Lymphs Abs: 0.4 10*3/uL — ABNORMAL LOW (ref 0.7–4.0)
MCH: 29.2 pg (ref 26.0–34.0)
MCHC: 33.2 g/dL (ref 30.0–36.0)
MCV: 88 fL (ref 80.0–100.0)
Monocytes Absolute: 0.2 10*3/uL (ref 0.1–1.0)
Monocytes Relative: 5 %
Neutro Abs: 3.8 10*3/uL (ref 1.7–7.7)
Neutrophils Relative %: 85 %
Platelets: 185 10*3/uL (ref 150–400)
RBC: 4.42 MIL/uL (ref 3.87–5.11)
RDW: 13.3 % (ref 11.5–15.5)
WBC: 4.5 10*3/uL (ref 4.0–10.5)
nRBC: 0 % (ref 0.0–0.2)

## 2020-07-14 LAB — GLUCOSE, CAPILLARY
Glucose-Capillary: 270 mg/dL — ABNORMAL HIGH (ref 70–99)
Glucose-Capillary: 223 mg/dL — ABNORMAL HIGH (ref 70–99)

## 2020-07-14 LAB — HEMOGLOBIN A1C
Hgb A1c MFr Bld: 6.7 % — ABNORMAL HIGH (ref 4.8–5.6)
Mean Plasma Glucose: 145.59 mg/dL

## 2020-07-14 LAB — C-REACTIVE PROTEIN: CRP: 15.3 mg/dL — ABNORMAL HIGH (ref <1.0)

## 2020-07-14 LAB — D-DIMER, QUANTITATIVE: D-Dimer, Quant: 1.32 ug/mL-FEU — ABNORMAL HIGH (ref 0.00–0.50)

## 2020-07-14 MED ORDER — BARICITINIB 2 MG PO TABS
4.0000 mg | ORAL_TABLET | Freq: Every day | ORAL | Status: DC
  Administered 2020-07-14 – 2020-07-16 (×3): 4 mg via ORAL
  Filled 2020-07-14 (×3): qty 2

## 2020-07-14 MED ORDER — ALPRAZOLAM 0.25 MG PO TABS
0.2500 mg | ORAL_TABLET | Freq: Two times a day (BID) | ORAL | Status: DC | PRN
  Administered 2020-07-14 – 2020-07-15 (×3): 0.25 mg via ORAL
  Filled 2020-07-14 (×3): qty 1

## 2020-07-14 MED ORDER — PANTOPRAZOLE SODIUM 40 MG PO TBEC: 40.0000 mg | DELAYED_RELEASE_TABLET | Freq: Every day | ORAL | Status: DC

## 2020-07-14 MED ORDER — AMLODIPINE BESYLATE 10 MG PO TABS
10.0000 mg | ORAL_TABLET | Freq: Every day | ORAL | Status: DC
  Administered 2020-07-14 – 2020-07-16 (×3): 10 mg via ORAL
  Filled 2020-07-14 (×3): qty 1

## 2020-07-14 MED ORDER — PREGABALIN 100 MG PO CAPS
100.0000 mg | ORAL_CAPSULE | Freq: Two times a day (BID) | ORAL | Status: DC
  Administered 2020-07-14 – 2020-07-16 (×6): 100 mg via ORAL
  Filled 2020-07-14 (×6): qty 1

## 2020-07-14 MED ORDER — VENLAFAXINE HCL ER 37.5 MG PO CP24
37.5000 mg | ORAL_CAPSULE | Freq: Every day | ORAL | Status: DC
  Administered 2020-07-14 – 2020-07-16 (×3): 37.5 mg via ORAL
  Filled 2020-07-14 (×3): qty 1

## 2020-07-14 MED ORDER — INSULIN ASPART 100 UNIT/ML ~~LOC~~ SOLN
0.0000 [IU] | Freq: Three times a day (TID) | SUBCUTANEOUS | Status: DC
  Administered 2020-07-14: 8 [IU] via SUBCUTANEOUS
  Administered 2020-07-15 – 2020-07-16 (×4): 3 [IU] via SUBCUTANEOUS

## 2020-07-14 MED ORDER — EZETIMIBE 10 MG PO TABS
10.0000 mg | ORAL_TABLET | Freq: Every day | ORAL | Status: DC
  Administered 2020-07-14 – 2020-07-15 (×2): 10 mg via ORAL
  Filled 2020-07-14 (×4): qty 1

## 2020-07-14 MED ORDER — INSULIN ASPART 100 UNIT/ML ~~LOC~~ SOLN
0.0000 [IU] | Freq: Every day | SUBCUTANEOUS | Status: DC
  Administered 2020-07-14 – 2020-07-15 (×2): 2 [IU] via SUBCUTANEOUS

## 2020-07-14 NOTE — Progress Notes (Signed)
Her eGFR>60, we will adjust her baricitinib to 41m qday. FYI messaged Dr. [facility]  MOnnie Boer PharmD, BCIDP, AAHIVP, CPP Infectious Disease Pharmacist 07/14/2020 9:01 AM

## 2020-07-14 NOTE — Plan of Care (Signed)
Patient is currently resting in bed. OOB ambulating to BR. VSS. Currently on 5L [place]. Chronic pain, oxy Scheduled. Call bell within reach.  Problem: Education: Goal: Knowledge of risk factors and measures for prevention of condition will improve Outcome: Progressing   Problem: Coping: Goal: Psychosocial and spiritual needs will be supported Outcome: Progressing   Problem: Respiratory: Goal: Will maintain a patent airway Outcome: Progressing Goal: Complications related to the disease process, condition or treatment will be avoided or minimized Outcome: Progressing

## 2020-07-14 NOTE — Progress Notes (Signed)
PROGRESS NOTE                                                                             PROGRESS NOTE                                                                                                                                                                                                             Patient Demographics:    [name], is a 60 y.o. female, DOB - 12-Jan-1961, [id number]  Outpatient Primary MD for the patient is Deland Pretty, MD    LOS - 1  Admit date - 07/13/2020    No chief complaint on file.      Brief Narrative    HPI: [name] is a 60 y.o. female with medical history significant for hypertension, chronic pain, and anxiety, now presenting to the emergency department after she became poorly responsive at home. Patient reportedly tested positive for COVID-19 at home on 07/07/2020, has been experiencing fatigue, and general malaise, fevers, and shortness of breath. Her husband went to get her some Tylenol for fever and then found her to be unresponsive. He called EMS and patient was found to have an oxygen saturation of 50%. She was placed on 15 L/min of supplemental oxygen and brought into the ED. Patient is waking up in the emergency department and reports that her primary symptom over the past week has been fatigue but she also has some general aches and malaise and shortness of breath. She denies chest pain, headache, leg swelling or tenderness, or focal numbness or weakness.  ED Course: Upon arrival to the ED, patient is found to be febrile to 38.3 C, saturating mid 90s on 6 L/min of supplemental oxygen, slightly tachypneic, and with stable blood pressure. EKG features sinus rhythm. Noncontrast head CT negative for acute intracranial abnormality. Chest x-ray notable for interval development of bilateral patchy airspace opacities. Chemistry panel features a creatinine 1.27. CBC notable for a WBC of 3800 with ALC of 570.  Blood cultures were collected in  the ED and the patient was started on supplemental oxygen and remdesivir. Confirmatory Covid test is pending.   Subjective:    [name] today reports her dyspnea has improved from yesterday, denies any chest pain, fever or chills   Assessment  & Plan :    Principal Problem:   Acute respiratory failure due to COVID-19 [facility]) Active Problems:   SYNDROME, CHRONIC PAIN   Essential hypertension   Anxiety   Acute encephalopathy   Renal insufficiency  Acute Hypoxic Resp. Failure due to Acute Covid 19 Viral Pneumonitis during the ongoing 2020 Covid 19 Pandemic  -reports she Vaccinated with Moderna, but did not receive a booster - Patient reportedly tested positive for COVID at home on 1/24, has had fatigue, fevers, and malaise, became poorly responsive, was saturating 50% with EMS, and has new patchy airspace opacities on CXR  - 6 L nasal cannula, this has improved this morning she is on 4 L. -Continue with IV Solu-Medrol. -Continue with remdesivir -Continue with baricitinib, dose adjusted to renal function -Encouraged the patient to sit up in chair in the daytime use I-S and flutter valve for pulmonary toiletry and then prone in bed when at night.  Will advance activity and titrate down oxygen as possible.  Actemra/Baricitinib  off label use - patient was told that if COVID-19 pneumonitis gets worse we might potentially use Actemra off label, patient denies any known history of active diverticulitis, tuberculosis or hepatitis, understands the risks and benefits and wants to proceed with Actemra treatment if required.     SpO2: 93 % O2 Flow Rate (L/min): 4 L/min  Recent Labs  Lab 07/13/20 0208 07/13/20 0306 07/13/20 0330 07/13/20 0436 07/13/20 0609 07/14/20 0233  WBC 3.8*  --   --   --   --  4.5  PLT 163  --   --   --   --  185  CRP  --  16.9*  --   --   --  15.3*  BNP  --   --   --   --  55.5  --   DDIMER  --  1.69*  --   --   --  1.32*   PROCALCITON  --  0.45  --   --   --   --   AST 38  --   --   --   --  36  ALT 34  --   --   --   --  32  ALKPHOS 61  --   --   --   --  45  BILITOT 0.6  --   --   --   --  0.5  ALBUMIN 3.0*  --   --   --   --  2.5*  INR 1.0  --   --   --   --   --   LATICACIDVEN  --   --   --  0.9  --   --   SARSCOV2NAA  --   --  POSITIVE*  --   --   --        ABG     Component Value Date/Time   HCO3 32.0 (H) 07/13/2020 0220   TCO2 34 (H) 07/13/2020 0220   O2SAT 33.0 07/13/2020 0220     Acute Metabolic encephalopathy  - related to COVID-19 of pneumonia, as well possibly due to hypoxia - resolved.  Anxiety  - Home meds   Chronic pain  - Resume home regimen at  a lower dose given she came with altered mental status   Hypertension  - started  to increase, resume her Norvasc  Renal insufficiency  - SCr is 1.27 on admission, up from 0.9 in [id number]  - Uncertain chronicity, hold lisinopril for now and monitor    Lymphocytopenia  - Due to Covid - HIV nonreactive  Hyperglycemia -Due to Covid and steroids, will start on sliding scale.   Condition - Extremely Guarded  Family communication: Patient is awake alert and appropriate and coherent, I have explained to her in details, and answered all her questions.  Code Status :  full  Consults  :  none  Disposition Plan  :    Status is: Inpatient  Remains inpatient appropriate because:IV treatments appropriate due to intensity of illness or inability to take PO   Dispo: The patient is from: Home              Anticipated d/c is to: Home              Anticipated d/c date is: 3 days              Patient currently is not medically stable to d/c.   Difficult to place patient No      DVT Prophylaxis  :  Lovenox  Lab Results  Component Value Date   PLT 185 07/14/2020    Diet :  Diet Order            Diet Heart Room service appropriate? Yes; Fluid consistency: Thin  Diet effective now                  Inpatient  Medications  Scheduled Meds: . baricitinib  4 mg Oral Daily  . enoxaparin (LOVENOX) injection  40 mg Subcutaneous Daily  . methylPREDNISolone (SOLU-MEDROL) injection  60 mg Intravenous BID  . oxyCODONE  20 mg Oral Q12H  . pantoprazole  40 mg Oral Daily  . pregabalin  100 mg Oral BID   Continuous Infusions: . sodium chloride    . remdesivir 100 mg in NS 100 mL 100 mg (07/14/20 0925)   PRN Meds:.sodium chloride, acetaminophen, ALPRAZolam, ondansetron **OR** ondansetron (ZOFRAN) IV  Antibiotics  :    Anti-infectives (From admission, onward)   Start     Dose/Rate Route Frequency Ordered Stop   07/14/20 1000  remdesivir 100 mg in sodium chloride 0.9 % 100 mL IVPB       "Followed by" Linked Group Details   100 mg 200 mL/hr over 30 Minutes Intravenous Daily 07/13/20 0332 07/18/20 0959   07/13/20 0400  remdesivir 200 mg in sodium chloride 0.9% 250 mL IVPB       "Followed by" Linked Group Details   200 mg 580 mL/hr over 30 Minutes Intravenous Once 07/13/20 0332 07/13/20 0429        Emeline Gins Elgergawy M.D on 07/14/2020 at 1:46 PM  To page go to [email or web address]  [facility] -  Office  [phone]     Objective:   Vitals:   07/14/20 0415 07/14/20 0910 07/14/20 1055 07/14/20 1209  BP:    (!) 143/79  Pulse:  75 83 83  Resp:    17  Temp:    97.7 F (36.5 C)  TempSrc:    Oral  SpO2: 93% 95% 93% 93%  Weight:      Height:        Wt Readings from Last 3 Encounters:  07/13/20 93.4 kg  09/03/19 93.4 kg  04/23/15  82.6 kg    No intake or output data in the 24 hours ending 07/14/20 1346   Physical Exam  Awake Alert, No new F.N deficits, Normal affect Symmetrical Chest wall movement, Good air movement bilaterally, CTAB RRR,No Gallops,Rubs or new Murmurs, No Parasternal Heave +ve B.Sounds, Abd Soft, No tenderness, No organomegaly appriciated, No rebound - guarding or rigidity. No Cyanosis, Clubbing or edema, No new Rash or bruise      Data Review:    CBC Recent  Labs  Lab 07/13/20 0208 07/13/20 0215 07/13/20 0220 07/14/20 0233  WBC 3.8*  --   --  4.5  HGB 14.5 15.3* 15.0 12.9  HCT 44.1 45.0 44.0 38.9  PLT 163  --   --  185  MCV 90.9  --   --  88.0  MCH 29.9  --   --  29.2  MCHC 32.9  --   --  33.2  RDW 14.0  --   --  13.3  LYMPHSABS 0.6*  --   --  0.4*  MONOABS 0.3  --   --  0.2  EOSABS 0.0  --   --  0.0  BASOSABS 0.0  --   --  0.0    Recent Labs  Lab 07/13/20 0208 07/13/20 0215 07/13/20 0220 07/13/20 0306 07/13/20 0436 07/13/20 0609 07/14/20 0233  NA 136 136 137  --   --   --  137  K 4.9 4.9 4.9  --   --   --  4.8  CL 97* 98  --   --   --   --  98  CO2 29  --   --   --   --   --  29  GLUCOSE 154* 142*  --   --   --   --  214*  BUN 14 17  --   --   --   --  17  CREATININE 1.27* 1.10*  --   --   --   --  0.98  CALCIUM 8.6*  --   --   --   --   --  9.0  AST 38  --   --   --   --   --  36  ALT 34  --   --   --   --   --  32  ALKPHOS 61  --   --   --   --   --  45  BILITOT 0.6  --   --   --   --   --  0.5  ALBUMIN 3.0*  --   --   --   --   --  2.5*  CRP  --   --   --  16.9*  --   --  15.3*  DDIMER  --   --   --  1.69*  --   --  1.32*  PROCALCITON  --   --   --  0.45  --   --   --   LATICACIDVEN  --   --   --   --  0.9  --   --   INR 1.0  --   --   --   --   --   --   BNP  --   --   --   --   --  55.5  --     ------------------------------------------------------------------------------------------------------------------ Recent Labs    07/13/20 0306  TRIG 114    Lab  Results  Component Value Date   HGBA1C 6.2 08/06/2013   ------------------------------------------------------------------------------------------------------------------ No results for input(s): TSH, T4TOTAL, T3FREE, THYROIDAB in the last 72 hours.  Invalid input(s): FREET3  Cardiac Enzymes No results for input(s): CKMB, TROPONINI, MYOGLOBIN in the last 168 hours.  Invalid input(s):  CK ------------------------------------------------------------------------------------------------------------------    Component Value Date/Time   BNP 55.5 07/13/2020 0609    Micro Results Recent Results (from the past 240 hour(s))  Blood Culture (routine x 2)     Status: None (Preliminary result)   Collection Time: 07/13/20  3:06 AM   Specimen: BLOOD  Result Value Ref Range Status   Specimen Description BLOOD SITE NOT SPECIFIED  Final   Special Requests   Final    BOTTLES DRAWN AEROBIC AND ANAEROBIC Blood Culture results may not be optimal due to an excessive volume of blood received in culture bottles   Culture   Final    NO GROWTH < 12 HOURS Performed at [facility], [address], [place], [place] [address]    Report Status PENDING  Incomplete  Blood Culture (routine x 2)     Status: None (Preliminary result)   Collection Time: 07/13/20  3:07 AM   Specimen: BLOOD  Result Value Ref Range Status   Specimen Description BLOOD SITE NOT SPECIFIED  Final   Special Requests   Final    BOTTLES DRAWN AEROBIC ONLY Blood Culture results may not be optimal due to an inadequate volume of blood received in culture bottles   Culture   Final    NO GROWTH < 12 HOURS Performed at [facility], [address], [place], [place] [address]    Report Status PENDING  Incomplete  SARS Coronavirus 2 by RT PCR (hospital order, performed in [facility] hospital lab) Nasopharyngeal Nasopharyngeal Swab     Status: Abnormal   Collection Time: 07/13/20  3:30 AM   Specimen: Nasopharyngeal Swab  Result Value Ref Range Status   SARS Coronavirus 2 POSITIVE (A) NEGATIVE Final    Comment: RESULT CALLED TO, READ BACK BY AND VERIFIED WITH: K. ISLEY,RN 0501 07/13/2020 T. TYSOR (NOTE) SARS-CoV-2 target nucleic acids are DETECTED  SARS-CoV-2 RNA is generally detectable in upper respiratory specimens  during the acute phase of infection.  Positive results are indicative  of the presence of the  identified virus, but do not rule out bacterial infection or co-infection with other pathogens not detected by the test.  Clinical correlation with patient history and  other diagnostic information is necessary to determine patient infection status.  The expected result is negative.  Fact Sheet for Patients:   StrictlyIdeas.no   Fact Sheet for Healthcare Providers:   BankingDealers.co.za    This test is not yet approved or cleared by the [place] FDA and  has been authorized for detection and/or diagnosis of SARS-CoV-2 by FDA under an Emergency Use Authorization (EUA).  This EUA will remain in effect (meaning this  test can be used) for the duration of  the COVID-19 declaration under Section 564(b)(1) of the Act, 21 U.S.C. section 360-bbb-3(b)(1), unless the authorization is terminated or revoked sooner.  Performed at [facility], [place] [address] [place],  [address]   MRSA PCR Screening     Status: None   Collection Time: 07/14/20  3:59 AM   Specimen: Nasal Mucosa; Nasopharyngeal  Result Value Ref Range Status   MRSA by PCR NEGATIVE NEGATIVE Final    Comment:  The GeneXpert MRSA Assay (FDA approved for NASAL specimens only), is one component of a comprehensive MRSA colonization surveillance program. It is not intended to diagnose MRSA infection nor to guide or monitor treatment for MRSA infections. Performed at [facility], [place] [address], [place], [place] [address]     Radiology Reports DG Chest [place] 1 View  Result Date: 07/13/2020 CLINICAL DATA:  Stroke EXAM: PORTABLE CHEST 1 VIEW.  Patient is rotated. COMPARISON:  Chest x-ray 08/10/2010 FINDINGS: Prominent cardiac silhouette which may be due to AP portable technique. Otherwise the heart size and mediastinal contours are unchanged. Poor delineation of the ascending aortic arch may be due to rotation. Interval development of bilateral lower  lung zone patchy airspace opacities. No pulmonary edema. No pleural effusion. No pneumothorax. No acute osseous abnormality. Suggestion of right neck atherosclerotic plaque. IMPRESSION: 1. Interval development of bilateral lower lung zone patchy airspace opacities could represent infection versus inflammation (aspiration pneumonia). COVID 19 infection not excluded. 2. Poor delineation of the ascending aortic arch may be due to rotation. Consider repeat chest x-ray. Electronically Signed   By: Iven Finn M.D.   On: 07/13/2020 03:07   CT HEAD CODE STROKE WO CONTRAST  Result Date: 07/13/2020 CLINICAL DATA:  Code stroke. Initial evaluation for acute unresponsiveness. EXAM: CT HEAD WITHOUT CONTRAST TECHNIQUE: Contiguous axial images were obtained from the base of the skull through the vertex without intravenous contrast. COMPARISON:  None. FINDINGS: Brain: Cerebral volume within normal limits. No acute intracranial hemorrhage. No acute large vessel territory infarct. No mass lesion, midline shift or mass effect. No hydrocephalus or extra-axial fluid collection. Vascular: No hyperdense vessel. Skull: Scalp soft tissues and calvarium within normal limits. Sinuses/Orbits: Globes and orbital soft tissues within normal limits. Scattered mucosal thickening noted throughout the paranasal sinuses. No mastoid effusion. Other: None. ASPECTS [facility] Stroke Program Early CT Score) - Ganglionic level infarction (caudate, lentiform nuclei, internal capsule, insula, M1-M3 cortex): 7 - Supraganglionic infarction (M4-M6 cortex): 3 Total score (0-10 with 10 being normal): 10 IMPRESSION: 1. No acute intracranial infarct or other abnormality. 2. ASPECTS is 10. These results were communicated to Dr. Cheral Marker at 2:34 amon 1/30/2022by text page via the [facility] messaging system. Electronically Signed   By: Jeannine Boga M.D.   On: 07/13/2020 02:34

## 2020-07-14 NOTE — ED Provider Notes (Signed)
[facility] Provider Note   CSN: [id number] Arrival date & time: 07/13/20  0204     History No chief complaint on file.   [name] is a 60 y.o. female.  60 year old female who presents to the emergency department today secondary to a stroke alert.  It sounds like the patient husband said that she was diagnosed with Covid recently.  She was in relatively normal state of health but over about 30 minutes prior to arrival she suddenly became unresponsive.  Called EMS.  Secondary to that suddenness she was called as a code stroke but they found her oxygen to be 50% so started on oxygen.  On the way here her oxygen improved and her mental status apparently improved in route as well.  History is limited secondary to altered mental status.  Still not really communicating verbally.  The history is provided by the EMS personnel and the spouse. No language interpreter was used.       Past Medical History:  Diagnosis Date  . Arthritis   . Bladder spasms   . GERD (gastroesophageal reflux disease)   . Hyperlipidemia   . Hypertension     Patient Active Problem List   Diagnosis Date Noted  . Acute respiratory failure due to COVID-19 ([place]) 07/13/2020  . Acute encephalopathy 07/13/2020  . Renal insufficiency 07/13/2020  . Neuritis 04/23/2015  . Neuralgia 04/23/2015  . Neuropathy 04/23/2015  . Hyperalgesia 04/23/2015  . Allodynia 04/23/2015  . Obese 08/02/2012  . Anxiety 06/07/2011  . Hyperlipidemia   . NECK PAIN 04/20/2010  . Essential hypertension 11/29/2008  . TOBACCO USE 09/30/2007  . SYNDROME, CHRONIC PAIN 03/24/2007  . MENOPAUSAL DISORDER 03/24/2007  . GERD 01/04/2007  . CHOLELITHIASIS 01/04/2007    Past Surgical History:  Procedure Laterality Date  . [place], 2006  . BREAST LUMPECTOMY  1976   bi-lat , both benign   . CHOLECYSTECTOMY  1998  . LEG SURGERY  2009   work related injury  . PARTIAL HYSTERECTOMY       OB  History    Gravida  3   Para  2   Term      Preterm      AB  1   Living  2     SAB  1   IAB      Ectopic      Multiple      Live Births              Family History  Problem Relation Age of Onset  . Diabetes Mother   . Hypertension Mother   . Heart disease Mother   . Hypertension Father   . Diabetes Father   . Heart disease Father   . Neuropathy Neg Hx     Social History   Tobacco Use  . Smoking status: Current Every Day Smoker    Packs/day: 1.00    Years: 37.00    Pack years: 37.00    Types: Cigarettes  . Smokeless tobacco: Never Used  Substance Use Topics  . Alcohol use: No  . Drug use: No    Home Medications Prior to Admission medications   Medication Sig Start Date End Date Taking? Authorizing Provider  ALPRAZolam (XANAX) 0.25 MG tablet Take 0.25 mg by mouth 2 (two) times daily as needed for anxiety. 02/20/15  Yes [provider]  amLODipine (NORVASC) 10 MG tablet Take 10 mg by mouth daily. 05/05/20  Yes  [provider]  dexlansoprazole (DEXILANT) 60 MG capsule Take 60 mg by mouth daily.   Yes [provider]  ezetimibe (ZETIA) 10 MG tablet Take 10 mg by mouth daily.   Yes [provider]  lidocaine (LIDODERM) 5 % Place 3 patches onto the skin daily. Remove & Discard patch within 12 hours or as directed by MD Patient taking differently: Place 3 patches onto the skin daily as needed (pain). 04/23/15  Yes Melvenia Beam, MD  loratadine (CLARITIN) 10 MG tablet Take 10 mg by mouth daily. For allergies   Yes [provider]  metFORMIN (GLUCOPHAGE-XR) 500 MG 24 hr tablet Take 500 mg by mouth 2 (two) times daily. 06/09/20  Yes [provider]  oxyCODONE ER (XTAMPZA ER) 36 MG C12A Take 36 mg by mouth in the morning and at bedtime.   Yes [provider]  OZEMPIC, 0.25 OR 0.5 MG/DOSE, 2 MG/1.5ML SOPN Inject 2 mg into the skin once a week. Saturday 06/23/20  Yes [provider]  pregabalin  (LYRICA) 100 MG capsule Take 100 mg by mouth 2 (two) times daily.   Yes [provider]  venlafaxine XR (EFFEXOR-XR) 37.5 MG 24 hr capsule Take 37.5 mg by mouth daily.   Yes [provider]  estradiol (ESTRACE) 2 MG tablet Take 1 tablet (2 mg total) by mouth daily. Patient not taking: No sig reported 10/08/15   Huel Cote, NP  lisinopril (PRINIVIL,ZESTRIL) 5 MG tablet TAKE 1 TABLET BY MOUTH EVERY DAY Patient not taking: No sig reported 08/29/13   Swords, Darrick Penna, MD    Allergies    Codeine phosphate, Simvastatin, and Sulfamethoxazole  Review of Systems   Review of Systems  Unable to perform ROS: Mental status change    Physical Exam Updated Vital Signs BP 132/73 (BP Location: Right Arm)   Pulse 81   Temp 98 F (36.7 C) (Oral)   Resp 19   Ht 5\' 5"  (1.651 m)   Wt 93.4 kg   LMP 04/22/1997   SpO2 93%   BMI 34.27 kg/m   Physical Exam Vitals and nursing note reviewed.  Constitutional:      Appearance: She is well-developed and well-nourished.  HENT:     Head: Normocephalic and atraumatic.     Nose: No congestion or rhinorrhea.     Mouth/Throat:     Mouth: Mucous membranes are moist.     Pharynx: Oropharynx is clear.  Eyes:     Pupils: Pupils are equal, round, and reactive to light.  Cardiovascular:     Rate and Rhythm: Normal rate and regular rhythm.  Pulmonary:     Effort: No respiratory distress.     Breath sounds: No stridor.  Abdominal:     General: Abdomen is flat. There is no distension.  Musculoskeletal:        General: No swelling or tenderness. Normal range of motion.     Cervical back: Normal range of motion.  Skin:    General: Skin is warm and dry.  Neurological:     General: No focal deficit present.     Mental Status: She is alert and oriented to person, place, and time.     ED Results / Procedures / Treatments   Labs (all labs ordered are listed, but only abnormal results are displayed) Labs Reviewed  SARS CORONAVIRUS 2 BY RT  PCR (HOSPITAL ORDER, [facility]) - Abnormal; Notable for the following components:  Result Value   SARS Coronavirus 2 POSITIVE (*)    All other components within normal limits  APTT - Abnormal; Notable for the following components:   aPTT 21 (*)    All other components within normal limits  CBC - Abnormal; Notable for the following components:   WBC 3.8 (*)    All other components within normal limits  DIFFERENTIAL - Abnormal; Notable for the following components:   Lymphs Abs 0.6 (*)    All other components within normal limits  COMPREHENSIVE METABOLIC PANEL - Abnormal; Notable for the following components:   Chloride 97 (*)    Glucose, Bld 154 (*)    Creatinine, Ser 1.27 (*)    Calcium 8.6 (*)    Albumin 3.0 (*)    GFR, Estimated 48 (*)    All other components within normal limits  D-DIMER, QUANTITATIVE (NOT AT [facility]) - Abnormal; Notable for the following components:   D-Dimer, Quant 1.69 (*)    All other components within normal limits  LACTATE DEHYDROGENASE - Abnormal; Notable for the following components:   LDH 213 (*)    All other components within normal limits  FERRITIN - Abnormal; Notable for the following components:   Ferritin 354 (*)    All other components within normal limits  FIBRINOGEN - Abnormal; Notable for the following components:   Fibrinogen 690 (*)    All other components within normal limits  C-REACTIVE PROTEIN - Abnormal; Notable for the following components:   CRP 16.9 (*)    All other components within normal limits  CBC WITH DIFFERENTIAL/PLATELET - Abnormal; Notable for the following components:   Lymphs Abs 0.4 (*)    All other components within normal limits  C-REACTIVE PROTEIN - Abnormal; Notable for the following components:   CRP 15.3 (*)    All other components within normal limits  COMPREHENSIVE METABOLIC PANEL - Abnormal; Notable for the following components:   Glucose, Bld 214 (*)    Total Protein 5.9 (*)     Albumin 2.5 (*)    All other components within normal limits  D-DIMER, QUANTITATIVE (NOT AT [facility]) - Abnormal; Notable for the following components:   D-Dimer, Quant 1.32 (*)    All other components within normal limits  I-STAT CHEM 8, ED - Abnormal; Notable for the following components:   Creatinine, Ser 1.10 (*)    Glucose, Bld 142 (*)    Calcium, Ion 1.13 (*)    Hemoglobin 15.3 (*)    All other components within normal limits  CBG MONITORING, ED - Abnormal; Notable for the following components:   Glucose-Capillary 134 (*)    All other components within normal limits  I-STAT VENOUS BLOOD GAS, ED - Abnormal; Notable for the following components:   pO2, Ven 22.0 (*)    Bicarbonate 32.0 (*)    TCO2 34 (*)    Acid-Base Excess 5.0 (*)    Calcium, Ion 1.03 (*)    All other components within normal limits  CULTURE, BLOOD (ROUTINE X 2)  CULTURE, BLOOD (ROUTINE X 2)  MRSA PCR SCREENING  PROTIME-INR  LACTIC ACID, PLASMA  PROCALCITONIN  TRIGLYCERIDES  BRAIN NATRIURETIC PEPTIDE  HIV ANTIBODY (ROUTINE TESTING W REFLEX)    EKG EKG Interpretation  Date/Time:  Sunday July 13 2020 02:34:06 EST Ventricular Rate:  98 PR Interval:    QRS Duration: 113 QT Interval:  355 QTC Calculation: 454 R Axis:   43 Text Interpretation: Sinus rhythm Borderline intraventricular conduction delay improved ST changes compared  to 2013 Confirmed by Merrily Pew [id number]) on 07/13/2020 3:23:31 AM   Radiology DG Chest Port 1 View  Result Date: 07/13/2020 CLINICAL DATA:  Stroke EXAM: PORTABLE CHEST 1 VIEW.  Patient is rotated. COMPARISON:  Chest x-ray 08/10/2010 FINDINGS: Prominent cardiac silhouette which may be due to AP portable technique. Otherwise the heart size and mediastinal contours are unchanged. Poor delineation of the ascending aortic arch may be due to rotation. Interval development of bilateral lower lung zone patchy airspace opacities. No pulmonary edema. No pleural effusion. No  pneumothorax. No acute osseous abnormality. Suggestion of right neck atherosclerotic plaque. IMPRESSION: 1. Interval development of bilateral lower lung zone patchy airspace opacities could represent infection versus inflammation (aspiration pneumonia). COVID 19 infection not excluded. 2. Poor delineation of the ascending aortic arch may be due to rotation. Consider repeat chest x-ray. Electronically Signed   By: Iven Finn M.D.   On: 07/13/2020 03:07   CT HEAD CODE STROKE WO CONTRAST  Result Date: 07/13/2020 CLINICAL DATA:  Code stroke. Initial evaluation for acute unresponsiveness. EXAM: CT HEAD WITHOUT CONTRAST TECHNIQUE: Contiguous axial images were obtained from the base of the skull through the vertex without intravenous contrast. COMPARISON:  None. FINDINGS: Brain: Cerebral volume within normal limits. No acute intracranial hemorrhage. No acute large vessel territory infarct. No mass lesion, midline shift or mass effect. No hydrocephalus or extra-axial fluid collection. Vascular: No hyperdense vessel. Skull: Scalp soft tissues and calvarium within normal limits. Sinuses/Orbits: Globes and orbital soft tissues within normal limits. Scattered mucosal thickening noted throughout the paranasal sinuses. No mastoid effusion. Other: None. ASPECTS [facility] Stroke Program Early CT Score) - Ganglionic level infarction (caudate, lentiform nuclei, internal capsule, insula, M1-M3 cortex): 7 - Supraganglionic infarction (M4-M6 cortex): 3 Total score (0-10 with 10 being normal): 10 IMPRESSION: 1. No acute intracranial infarct or other abnormality. 2. ASPECTS is 10. These results were communicated to Dr. Cheral Marker at 2:34 amon 1/30/2022by text page via the [facility] messaging system. Electronically Signed   By: Jeannine Boga M.D.   On: 07/13/2020 02:34    Procedures .Critical Care Performed by: Merrily Pew, MD Authorized by: Merrily Pew, MD   Critical care provider statement:    Critical care time  (minutes):  45   Critical care was necessary to treat or prevent imminent or life-threatening deterioration of the following conditions:  Respiratory failure   Critical care was time spent personally by me on the following activities:  Discussions with consultants, evaluation of patient's response to treatment, examination of patient, ordering and performing treatments and interventions, ordering and review of laboratory studies, ordering and review of radiographic studies, pulse oximetry, re-evaluation of patient's condition, obtaining history from patient or surrogate and review of old charts     Medications Ordered in ED Medications  remdesivir 200 mg in sodium chloride 0.9% 250 mL IVPB (0 mg Intravenous Stopped 07/13/20 0429)    Followed by  remdesivir 100 mg in sodium chloride 0.9 % 100 mL IVPB (has no administration in time range)  enoxaparin (LOVENOX) injection 40 mg (40 mg Subcutaneous Given 07/13/20 0959)  0.9 %  sodium chloride infusion (has no administration in time range)  acetaminophen (TYLENOL) tablet 650 mg (650 mg Oral Given 07/14/20 0413)  ondansetron (ZOFRAN) tablet 4 mg (has no administration in time range)    Or  ondansetron (ZOFRAN) injection 4 mg (has no administration in time range)  methylPREDNISolone sodium succinate (SOLU-MEDROL) 125 mg/2 mL injection 60 mg (60 mg Intravenous Given 07/13/20 2248)  pantoprazole (PROTONIX)  EC tablet 40 mg (40 mg Oral Given 07/13/20 0958)  baricitinib (OLUMIANT) tablet 2 mg (has no administration in time range)  oxyCODONE (OXYCONTIN) 12 hr tablet 20 mg (20 mg Oral Given 07/13/20 1950)  ALPRAZolam (XANAX) tablet 0.25 mg (0.25 mg Oral Given 07/14/20 0027)  pregabalin (LYRICA) capsule 100 mg (100 mg Oral Given 07/14/20 0027)  sodium chloride flush (NS) 0.9 % injection 3 mL (3 mLs Intravenous Given 07/13/20 0443)    ED Course  I have reviewed the triage vital signs and the nursing notes.  Pertinent labs & imaging results that were available  during my care of the patient were reviewed by me and considered in my medical decision making (see chart for details).    MDM Rules/Calculators/A&P                          covid pneumonitis with hypoxic resp failure.  As her mental status seems to be improving with oxygen I think it is unlikely to be a stroke.  This time we will continue to treat her Covid.  Will admit to hospitalist for the same.  Final Clinical Impression(s) / ED Diagnoses Final diagnoses:  None    Rx / DC Orders ED Discharge Orders    None       Maurya Nethery, Corene Cornea, MD 07/14/20 [phone]

## 2020-07-15 LAB — COMPREHENSIVE METABOLIC PANEL
ALT: 33 U/L (ref 0–44)
AST: 28 U/L (ref 15–41)
Albumin: 2.5 g/dL — ABNORMAL LOW (ref 3.5–5.0)
Alkaline Phosphatase: 41 U/L (ref 38–126)
Anion gap: 11 (ref 5–15)
BUN: 22 mg/dL (ref 8–23)
CO2: 26 mmol/L (ref 22–32)
Calcium: 8.8 mg/dL — ABNORMAL LOW (ref 8.9–10.3)
Chloride: 99 mmol/L (ref 98–111)
Creatinine, Ser: 0.8 mg/dL (ref 0.44–1.00)
GFR, Estimated: >60 mL/min (ref >60)
Glucose, Bld: 196 mg/dL — ABNORMAL HIGH (ref 70–99)
Potassium: 4.3 mmol/L (ref 3.5–5.1)
Sodium: 136 mmol/L (ref 135–145)
Total Bilirubin: 0.5 mg/dL (ref 0.3–1.2)
Total Protein: 5.8 g/dL — ABNORMAL LOW (ref 6.5–8.1)

## 2020-07-15 LAB — CBC WITH DIFFERENTIAL/PLATELET
Abs Immature Granulocytes: 0.03 10*3/uL (ref 0.00–0.07)
Basophils Absolute: 0 10*3/uL (ref 0.0–0.1)
Basophils Relative: 0 %
Eosinophils Absolute: 0 10*3/uL (ref 0.0–0.5)
Eosinophils Relative: 0 %
HCT: 38.3 % (ref 36.0–46.0)
Hemoglobin: 12.8 g/dL (ref 12.0–15.0)
Immature Granulocytes: 1 %
Lymphocytes Relative: 13 %
Lymphs Abs: 0.6 10*3/uL — ABNORMAL LOW (ref 0.7–4.0)
MCH: 29.2 pg (ref 26.0–34.0)
MCHC: 33.4 g/dL (ref 30.0–36.0)
MCV: 87.2 fL (ref 80.0–100.0)
Monocytes Absolute: 0.3 10*3/uL (ref 0.1–1.0)
Monocytes Relative: 5 %
Neutro Abs: 4.1 10*3/uL (ref 1.7–7.7)
Neutrophils Relative %: 81 %
Platelets: 223 10*3/uL (ref 150–400)
RBC: 4.39 MIL/uL (ref 3.87–5.11)
RDW: 13.1 % (ref 11.5–15.5)
WBC: 5 10*3/uL (ref 4.0–10.5)
nRBC: 0 % (ref 0.0–0.2)

## 2020-07-15 LAB — GLUCOSE, CAPILLARY
Glucose-Capillary: 173 mg/dL — ABNORMAL HIGH (ref 70–99)
Glucose-Capillary: 189 mg/dL — ABNORMAL HIGH (ref 70–99)
Glucose-Capillary: 183 mg/dL — ABNORMAL HIGH (ref 70–99)

## 2020-07-15 LAB — D-DIMER, QUANTITATIVE: D-Dimer, Quant: 0.58 ug/mL-FEU — ABNORMAL HIGH (ref 0.00–0.50)

## 2020-07-15 LAB — C-REACTIVE PROTEIN: CRP: 5 mg/dL — ABNORMAL HIGH (ref <1.0)

## 2020-07-15 NOTE — Evaluation (Signed)
Physical Therapy Evaluation Patient Details Name: [name] MRN: [id number] DOB: 12-22-1960 Today's Date: 07/15/2020   History of Present Illness  60 y.o. female with medical history significant for hypertension, chronic pain, and anxiety, now presenting to the emergency department after she became poorly responsive at home. Patient reportedly tested positive for COVID-19 at home on 07/07/2020, has been experiencing fatigue, and general malaise, fevers, and shortness of breath. Pt's husband found her to be unresponsive and called EMS and pt found to have O2 saturation of 50%O2. In ED patient is found to be febrile to 38.3 C, saturating mid 90s on 6 L/min of supplemental oxygen, slightly tachypneic, and with stable blood pressure. Chest x-ray notable for interval development of bilateral patchy airspace opacities. Admitted 1/30 for treatment of Acute hypoxic respiratory failure suspected secondary to COVID-19 and acute encephalopathy.  Clinical Impression  PTA pt living with husband in single story home with one step to enter. Pt completely independent working in a [employer], [employer]. Pt is currently limited in safe mobility by increased O2 demand and generalized weakness. Pt is mod I for all mobility. Pt educated in progression of ambulation and activity to improve strength and endurance when she is at home. Pt has no further PT or equipment needs. PT signing off.     Follow Up Recommendations No PT follow up;Supervision for mobility/OOB    Equipment Recommendations  None recommended by PT       Precautions / Restrictions Precautions Precautions: None Restrictions Weight Bearing Restrictions: No      Mobility  Bed Mobility               General bed mobility comments: sitting EoB on entry    Transfers Overall transfer level: Modified independent Equipment used: None             General transfer comment: good power up increased effort to self steady, no outside assist  required  Ambulation/Gait Ambulation/Gait assistance: Modified independent (Device/Increase time) Gait Distance (Feet): 150 Feet Assistive device: None Gait Pattern/deviations: Step-through pattern;Decreased step length - right;Decreased step length - left;Drifts right/left Gait velocity: slowed Gait velocity interpretation: 1.31 - 2.62 ft/sec, indicative of limited community ambulator General Gait Details: ambulates with slow, mildly unsteady gait, not requiring assist but drifts L and R with distance ambulation     Balance Overall balance assessment: Mild deficits observed, not formally tested                                           Pertinent Vitals/Pain Pain Assessment: No/denies pain    Home Living Family/patient expects to be discharged to:: Private residence Living Arrangements: Spouse/significant other Available Help at Discharge: Family;Available 24 hours/day Type of Home: House Home Access: Stairs to enter   [employer] of Steps: 1 Home Layout: One level Home Equipment: None      Prior Function Level of Independence: Independent         Comments: working in [employer]        Extremity/Trunk Assessment   Upper Extremity Assessment Upper Extremity Assessment: Generalized weakness    Lower Extremity Assessment Lower Extremity Assessment: Generalized weakness    Cervical / Trunk Assessment Cervical / Trunk Assessment: Normal  Communication   Communication: No difficulties  Cognition Arousal/Alertness: Awake/alert Behavior During Therapy: WFL for tasks assessed/performed Overall Cognitive Status: Within Functional Limits for  tasks assessed                                        General Comments General comments (skin integrity, edema, etc.): SaO2 on RA 90%O2, requires 3L via [place] for SaO2 to remain at 90%O2        Assessment/Plan    PT Assessment Patent does not need any further  PT services         PT Goals (Current goals can be found in the Care Plan section)  Acute Rehab PT Goals Patient Stated Goal: get back to work PT Goal Formulation: With patient Time For Goal Achievement: 07/29/20 Potential to Achieve Goals: Good     AM-PAC PT "6 Clicks" Mobility  Outcome Measure Help needed turning from your back to your side while in a flat bed without using bedrails?: None Help needed moving from lying on your back to sitting on the side of a flat bed without using bedrails?: None Help needed moving to and from a bed to a chair (including a wheelchair)?: None Help needed standing up from a chair using your arms (e.g., wheelchair or bedside chair)?: None Help needed to walk in hospital room?: None Help needed climbing 3-5 steps with a railing? : None 6 Click Score: 24    End of Session Equipment Utilized During Treatment: Oxygen Activity Tolerance: Patient tolerated treatment well Patient left: in bed;with call bell/phone within reach Nurse Communication: Mobility status;Other (comment) (walking O2 saturation) PT Visit Diagnosis: Muscle weakness (generalized) (M62.81)    Time: 3790-2409 PT Time Calculation (min) (ACUTE ONLY): 24 min   Charges:   PT Evaluation $PT Eval Moderate Complexity: 1 Mod PT Treatments $Therapeutic Exercise: 8-22 mins        Lemoine Goyne B. Migdalia Dk PT, DPT Acute Rehabilitation Services Pager [phone] Office [phone]   [place] 07/15/2020, 1:18 PM

## 2020-07-15 NOTE — Plan of Care (Signed)
VSS. On 3L [place]. OOB ambulating to BR. No complaints overnight. Call bell within reach.   Problem: Education: Goal: Knowledge of risk factors and measures for prevention of condition will improve Outcome: Progressing   Problem: Coping: Goal: Psychosocial and spiritual needs will be supported Outcome: Progressing   Problem: Respiratory: Goal: Will maintain a patent airway Outcome: Progressing Goal: Complications related to the disease process, condition or treatment will be avoided or minimized Outcome: Progressing

## 2020-07-15 NOTE — Progress Notes (Signed)
PROGRESS NOTE                                                                             PROGRESS NOTE                                                                                                                                                                                                             Patient Demographics:    [name], is a 60 y.o. female, DOB - Jan 26, 1961, [id number]  Outpatient Primary MD for the patient is Jessica Pretty, MD    LOS - 2  Admit date - 07/13/2020    No chief complaint on file.      Brief Narrative     [name] is a 60 y.o. female with medical history significant for hypertension, chronic pain, and anxiety, is vaccinated but not boosted. Patient reportedly tested positive for COVID-19 at home on 07/07/2020, patient admitted for hypoxia due to COVID-19 of pneumonia.   Subjective:    [name] today reports her dyspnea has improved from yesterday, denies any chest pain, fever or chills   Assessment  & Plan :    Principal Problem:   Acute respiratory failure due to COVID-19 [facility]) Active Problems:   SYNDROME, CHRONIC PAIN   Essential hypertension   Anxiety   Acute encephalopathy   Renal insufficiency  Acute Hypoxic Resp. Failure due to Acute Covid 19 Viral Pneumonitis during the ongoing 2020 Covid 19 Pandemic  -reports she Vaccinated with Moderna, but did not receive a booster - Patient reportedly tested positive for COVID at home on 1/24, has had fatigue, fevers, and malaise, became poorly responsive, was saturating 50% with EMS, and has new patchy airspace opacities on CXR  - 6 L nasal cannula, this morning she is on 3.5 L nasal cannula. -Continue with IV Solu-Medrol. -Continue with remdesivir -Continue with baricitinib, dose adjusted to renal function -Encouraged the patient to sit up in chair in the daytime use I-S and flutter valve for pulmonary toiletry and then prone in bed  when at  night.  Will advance activity and titrate down oxygen as possible.  Actemra/Baricitinib  off label use - patient was told that if COVID-19 pneumonitis gets worse we might potentially use Actemra off label, patient denies any known history of active diverticulitis, tuberculosis or hepatitis, understands the risks and benefits and wants to proceed with Actemra treatment if required.      Recent Labs  Lab 07/13/20 0208 07/13/20 0306 07/13/20 0330 07/13/20 0436 07/13/20 0609 07/14/20 0233 07/15/20 0104  WBC 3.8*  --   --   --   --  4.5 5.0  PLT 163  --   --   --   --  185 223  CRP  --  16.9*  --   --   --  15.3* 5.0*  BNP  --   --   --   --  55.5  --   --   DDIMER  --  1.69*  --   --   --  1.32* 0.58*  PROCALCITON  --  0.45  --   --   --   --   --   AST 38  --   --   --   --  36 28  ALT 34  --   --   --   --  32 33  ALKPHOS 61  --   --   --   --  45 41  BILITOT 0.6  --   --   --   --  0.5 0.5  ALBUMIN 3.0*  --   --   --   --  2.5* 2.5*  INR 1.0  --   --   --   --   --   --   LATICACIDVEN  --   --   --  0.9  --   --   --   SARSCOV2NAA  --   --  POSITIVE*  --   --   --   --        ABG     Component Value Date/Time   HCO3 32.0 (H) 07/13/2020 0220   TCO2 34 (H) 07/13/2020 0220   O2SAT 33.0 07/13/2020 0220     Acute Metabolic encephalopathy  - related to COVID-19 of pneumonia, as well possibly due to hypoxia - resolved.  Anxiety  - Home meds   Chronic pain  - Resume home regimen at a lower dose given she came with altered mental status   Hypertension  - started  to increase, resume her Norvasc  Renal insufficiency  - SCr is 1.27 on admission, up from 0.9 in 2015  - Uncertain chronicity, hold lisinopril for now and monitor    Lymphocytopenia  - Due to Covid - HIV nonreactive  Hyperglycemia -A1c is 6.7 -Due to Covid and steroids, started on sliding scale.   Condition - Extremely Guarded  Family communication: Patient is awake alert and appropriate and  coherent, I have explained to her in details, and answered all her questions.  Code Status :  full  Consults  :  none  Disposition Plan  :    Status is: Inpatient  Remains inpatient appropriate because:IV treatments appropriate due to intensity of illness or inability to take PO   Dispo: The patient is from: Home              Anticipated d/c is to: Home              Anticipated d/c date  is: 2 days              Patient currently is not medically stable to d/c.   Difficult to place patient No      DVT Prophylaxis  :  Lovenox  Lab Results  Component Value Date   PLT 223 07/15/2020    Diet :  Diet Order            Diet Heart Room service appropriate? Yes; Fluid consistency: Thin  Diet effective now                  Inpatient Medications  Scheduled Meds: . amLODipine  10 mg Oral Daily  . baricitinib  4 mg Oral Daily  . enoxaparin (LOVENOX) injection  40 mg Subcutaneous Daily  . ezetimibe  10 mg Oral Daily  . insulin aspart  0-15 Units Subcutaneous TID WC  . insulin aspart  0-5 Units Subcutaneous QHS  . methylPREDNISolone (SOLU-MEDROL) injection  60 mg Intravenous BID  . oxyCODONE  20 mg Oral Q12H  . pantoprazole  40 mg Oral Daily  . pregabalin  100 mg Oral BID  . venlafaxine XR  37.5 mg Oral Daily   Continuous Infusions: . sodium chloride    . remdesivir 100 mg in NS 100 mL 100 mg (07/15/20 0822)   PRN Meds:.sodium chloride, acetaminophen, ALPRAZolam, ondansetron **OR** ondansetron (ZOFRAN) IV  Antibiotics  :    Anti-infectives (From admission, onward)   Start     Dose/Rate Route Frequency Ordered Stop   07/14/20 1000  remdesivir 100 mg in sodium chloride 0.9 % 100 mL IVPB       "Followed by" Linked Group Details   100 mg 200 mL/hr over 30 Minutes Intravenous Daily 07/13/20 0332 07/18/20 0959   07/13/20 0400  remdesivir 200 mg in sodium chloride 0.9% 250 mL IVPB       "Followed by" Linked Group Details   200 mg 580 mL/hr over 30 Minutes Intravenous  Once 07/13/20 0332 07/13/20 0429        Emeline Gins Krishana Lutze M.D on 07/15/2020 at 11:07 AM  To page go to [email or web address]  [facility] -  Office  [phone]     Objective:   Vitals:   07/15/20 0028 07/15/20 0401 07/15/20 0730 07/15/20 0934  BP: 130/81 (!) 143/76 (!) 142/78   Pulse: 70 78 74 84  Resp: 20 20 19    Temp: 97.6 F (36.4 C) 97.9 F (36.6 C) 97.8 F (36.6 C)   TempSrc: Oral Oral Oral   SpO2: 95% 95% 92% 93%  Weight:      Height:        Wt Readings from Last 3 Encounters:  07/13/20 93.4 kg  09/03/19 93.4 kg  04/23/15 82.6 kg     Intake/Output Summary (Last 24 hours) at 07/15/2020 1107 Last data filed at 07/15/2020 4818 Gross per 24 hour  Intake 115.35 ml  Output -  Net 115.35 ml     Physical Exam  Awake Alert, Oriented X 3, No new F.N deficits, Normal affect Symmetrical Chest wall movement, Good air movement bilaterally, CTAB RRR,No Gallops,Rubs or new Murmurs, No Parasternal Heave +ve B.Sounds, Abd Soft, No tenderness, No rebound - guarding or rigidity. No Cyanosis, Clubbing or edema, No new Rash or bruise       Data Review:    CBC Recent Labs  Lab 07/13/20 0208 07/13/20 0215 07/13/20 0220 07/14/20 0233 07/15/20 0104  WBC 3.8*  --   --  4.5 5.0  HGB  14.5 15.3* 15.0 12.9 12.8  HCT 44.1 45.0 44.0 38.9 38.3  PLT 163  --   --  185 223  MCV 90.9  --   --  88.0 87.2  MCH 29.9  --   --  29.2 29.2  MCHC 32.9  --   --  33.2 33.4  RDW 14.0  --   --  13.3 13.1  LYMPHSABS 0.6*  --   --  0.4* 0.6*  MONOABS 0.3  --   --  0.2 0.3  EOSABS 0.0  --   --  0.0 0.0  BASOSABS 0.0  --   --  0.0 0.0    Recent Labs  Lab 07/13/20 0208 07/13/20 0215 07/13/20 0220 07/13/20 0306 07/13/20 0436 07/13/20 0609 07/14/20 0233 07/14/20 1412 07/15/20 0104  NA 136 136 137  --   --   --  137  --  136  K 4.9 4.9 4.9  --   --   --  4.8  --  4.3  CL 97* 98  --   --   --   --  98  --  99  CO2 29  --   --   --   --   --  29  --  26  GLUCOSE 154* 142*  --    --   --   --  214*  --  196*  BUN 14 17  --   --   --   --  17  --  22  CREATININE 1.27* 1.10*  --   --   --   --  0.98  --  0.80  CALCIUM 8.6*  --   --   --   --   --  9.0  --  8.8*  AST 38  --   --   --   --   --  36  --  28  ALT 34  --   --   --   --   --  32  --  33  ALKPHOS 61  --   --   --   --   --  45  --  41  BILITOT 0.6  --   --   --   --   --  0.5  --  0.5  ALBUMIN 3.0*  --   --   --   --   --  2.5*  --  2.5*  CRP  --   --   --  16.9*  --   --  15.3*  --  5.0*  DDIMER  --   --   --  1.69*  --   --  1.32*  --  0.58*  PROCALCITON  --   --   --  0.45  --   --   --   --   --   LATICACIDVEN  --   --   --   --  0.9  --   --   --   --   INR 1.0  --   --   --   --   --   --   --   --   HGBA1C  --   --   --   --   --   --   --  6.7*  --   BNP  --   --   --   --   --  55.5  --   --   --     ------------------------------------------------------------------------------------------------------------------  Recent Labs    07/13/20 0306  TRIG 114    Lab Results  Component Value Date   HGBA1C 6.7 (H) 07/14/2020   ------------------------------------------------------------------------------------------------------------------ No results for input(s): TSH, T4TOTAL, T3FREE, THYROIDAB in the last 72 hours.  Invalid input(s): FREET3  Cardiac Enzymes No results for input(s): CKMB, TROPONINI, MYOGLOBIN in the last 168 hours.  Invalid input(s): CK ------------------------------------------------------------------------------------------------------------------    Component Value Date/Time   BNP 55.5 07/13/2020 0609    Micro Results Recent Results (from the past 240 hour(s))  Blood Culture (routine x 2)     Status: None (Preliminary result)   Collection Time: 07/13/20  3:06 AM   Specimen: BLOOD  Result Value Ref Range Status   Specimen Description BLOOD SITE NOT SPECIFIED  Final   Special Requests   Final    BOTTLES DRAWN AEROBIC AND ANAEROBIC Blood Culture results may not be  optimal due to an excessive volume of blood received in culture bottles   Culture   Final    NO GROWTH 1 DAY Performed at [facility], [place] [address], [place], [place] [address]    Report Status PENDING  Incomplete  Blood Culture (routine x 2)     Status: None (Preliminary result)   Collection Time: 07/13/20  3:07 AM   Specimen: BLOOD  Result Value Ref Range Status   Specimen Description BLOOD SITE NOT SPECIFIED  Final   Special Requests   Final    BOTTLES DRAWN AEROBIC ONLY Blood Culture results may not be optimal due to an inadequate volume of blood received in culture bottles   Culture   Final    NO GROWTH 1 DAY Performed at [facility], [place] [address], [place], [place] [address]    Report Status PENDING  Incomplete  SARS Coronavirus 2 by RT PCR (hospital order, performed in [facility] hospital lab) Nasopharyngeal Nasopharyngeal Swab     Status: Abnormal   Collection Time: 07/13/20  3:30 AM   Specimen: Nasopharyngeal Swab  Result Value Ref Range Status   SARS Coronavirus 2 POSITIVE (A) NEGATIVE Final    Comment: RESULT CALLED TO, READ BACK BY AND VERIFIED WITH: K. ISLEY,RN 0501 07/13/2020 T. TYSOR (NOTE) SARS-CoV-2 target nucleic acids are DETECTED  SARS-CoV-2 RNA is generally detectable in upper respiratory specimens  during the acute phase of infection.  Positive results are indicative  of the presence of the identified virus, but do not rule out bacterial infection or co-infection with other pathogens not detected by the test.  Clinical correlation with patient history and  other diagnostic information is necessary to determine patient infection status.  The expected result is negative.  Fact Sheet for Patients:   StrictlyIdeas.no   Fact Sheet for Healthcare Providers:   BankingDealers.co.za    This test is not yet approved or cleared by the [place] FDA and  has been authorized for detection and/or  diagnosis of SARS-CoV-2 by FDA under an Emergency Use Authorization (EUA).  This EUA will remain in effect (meaning this  test can be used) for the duration of  the COVID-19 declaration under Section 564(b)(1) of the Act, 21 U.S.C. section 360-bbb-3(b)(1), unless the authorization is terminated or revoked sooner.  Performed at [facility], [place] [address] [place],  [address]   MRSA PCR Screening     Status: None   Collection Time: 07/14/20  3:59 AM   Specimen: Nasal Mucosa; Nasopharyngeal  Result Value Ref Range Status   MRSA by PCR  NEGATIVE NEGATIVE Final    Comment:        The GeneXpert MRSA Assay (FDA approved for NASAL specimens only), is one component of a comprehensive MRSA colonization surveillance program. It is not intended to diagnose MRSA infection nor to guide or monitor treatment for MRSA infections. Performed at [facility], [place] [address] [place], [place] [address]     Radiology Reports DG Chest [place] 1 View  Result Date: 07/13/2020 CLINICAL DATA:  Stroke EXAM: PORTABLE CHEST 1 VIEW.  Patient is rotated. COMPARISON:  Chest x-ray 08/10/2010 FINDINGS: Prominent cardiac silhouette which may be due to AP portable technique. Otherwise the heart size and mediastinal contours are unchanged. Poor delineation of the ascending aortic arch may be due to rotation. Interval development of bilateral lower lung zone patchy airspace opacities. No pulmonary edema. No pleural effusion. No pneumothorax. No acute osseous abnormality. Suggestion of right neck atherosclerotic plaque. IMPRESSION: 1. Interval development of bilateral lower lung zone patchy airspace opacities could represent infection versus inflammation (aspiration pneumonia). COVID 19 infection not excluded. 2. Poor delineation of the ascending aortic arch may be due to rotation. Consider repeat chest x-ray. Electronically Signed   By: Iven Finn M.D.   On: 07/13/2020 03:07   CT HEAD CODE STROKE WO  CONTRAST  Result Date: 07/13/2020 CLINICAL DATA:  Code stroke. Initial evaluation for acute unresponsiveness. EXAM: CT HEAD WITHOUT CONTRAST TECHNIQUE: Contiguous axial images were obtained from the base of the skull through the vertex without intravenous contrast. COMPARISON:  None. FINDINGS: Brain: Cerebral volume within normal limits. No acute intracranial hemorrhage. No acute large vessel territory infarct. No mass lesion, midline shift or mass effect. No hydrocephalus or extra-axial fluid collection. Vascular: No hyperdense vessel. Skull: Scalp soft tissues and calvarium within normal limits. Sinuses/Orbits: Globes and orbital soft tissues within normal limits. Scattered mucosal thickening noted throughout the paranasal sinuses. No mastoid effusion. Other: None. ASPECTS [facility] Stroke Program Early CT Score) - Ganglionic level infarction (caudate, lentiform nuclei, internal capsule, insula, M1-M3 cortex): 7 - Supraganglionic infarction (M4-M6 cortex): 3 Total score (0-10 with 10 being normal): 10 IMPRESSION: 1. No acute intracranial infarct or other abnormality. 2. ASPECTS is 10. These results were communicated to Dr. Cheral Marker at 2:34 amon 1/30/2022by text page via the [facility] messaging system. Electronically Signed   By: Jeannine Boga M.D.   On: 07/13/2020 02:34

## 2020-07-15 NOTE — Progress Notes (Signed)
SATURATION QUALIFICATIONS: (This note is used to comply with regulatory documentation for home oxygen)  Patient Saturations on Room Air at Rest = 90%  Patient Saturations on Room Air while Ambulating = 83%  Patient Saturations on 3 Liters of oxygen while Ambulating = 90%  Please briefly explain why patient needs home oxygen:  Pt requires 3L O2 via [place] to maintain SaO2 90%O2 with ambulation.  Naftoli Penny B. Migdalia Dk PT, DPT Acute Rehabilitation Services Pager [phone] Office [phone]

## 2020-07-16 LAB — CBC WITH DIFFERENTIAL/PLATELET
Abs Immature Granulocytes: 0.04 10*3/uL (ref 0.00–0.07)
Basophils Absolute: 0 10*3/uL (ref 0.0–0.1)
Basophils Relative: 0 %
Eosinophils Absolute: 0 10*3/uL (ref 0.0–0.5)
Eosinophils Relative: 0 %
HCT: 40.7 % (ref 36.0–46.0)
Hemoglobin: 13 g/dL (ref 12.0–15.0)
Immature Granulocytes: 1 %
Lymphocytes Relative: 15 %
Lymphs Abs: 0.8 10*3/uL (ref 0.7–4.0)
MCH: 28.3 pg (ref 26.0–34.0)
MCHC: 31.9 g/dL (ref 30.0–36.0)
MCV: 88.5 fL (ref 80.0–100.0)
Monocytes Absolute: 0.3 10*3/uL (ref 0.1–1.0)
Monocytes Relative: 5 %
Neutro Abs: 4.1 10*3/uL (ref 1.7–7.7)
Neutrophils Relative %: 79 %
Platelets: 228 10*3/uL (ref 150–400)
RBC: 4.6 MIL/uL (ref 3.87–5.11)
RDW: 13.2 % (ref 11.5–15.5)
WBC: 5.2 10*3/uL (ref 4.0–10.5)
nRBC: 0 % (ref 0.0–0.2)

## 2020-07-16 LAB — COMPREHENSIVE METABOLIC PANEL
ALT: 59 U/L — ABNORMAL HIGH (ref 0–44)
AST: 41 U/L (ref 15–41)
Albumin: 2.7 g/dL — ABNORMAL LOW (ref 3.5–5.0)
Alkaline Phosphatase: 45 U/L (ref 38–126)
Anion gap: 10 (ref 5–15)
BUN: 23 mg/dL (ref 8–23)
CO2: 26 mmol/L (ref 22–32)
Calcium: 8.5 mg/dL — ABNORMAL LOW (ref 8.9–10.3)
Chloride: 100 mmol/L (ref 98–111)
Creatinine, Ser: 0.93 mg/dL (ref 0.44–1.00)
GFR, Estimated: >60 mL/min (ref >60)
Glucose, Bld: 189 mg/dL — ABNORMAL HIGH (ref 70–99)
Potassium: 4.8 mmol/L (ref 3.5–5.1)
Sodium: 136 mmol/L (ref 135–145)
Total Bilirubin: 0.7 mg/dL (ref 0.3–1.2)
Total Protein: 5.7 g/dL — ABNORMAL LOW (ref 6.5–8.1)

## 2020-07-16 LAB — C-REACTIVE PROTEIN: CRP: 1.5 mg/dL — ABNORMAL HIGH (ref <1.0)

## 2020-07-16 LAB — D-DIMER, QUANTITATIVE: D-Dimer, Quant: 0.47 ug/mL-FEU (ref 0.00–0.50)

## 2020-07-16 MED ORDER — PREDNISONE 10 MG PO TABS: ORAL_TABLET | ORAL | 0 refills | Status: DC

## 2020-07-16 MED ORDER — BENZONATATE 100 MG PO CAPS: 100.0000 mg | ORAL_CAPSULE | Freq: Three times a day (TID) | ORAL | 0 refills | Status: AC | PRN

## 2020-07-16 MED ORDER — BENZONATATE 100 MG PO CAPS: 100.0000 mg | ORAL_CAPSULE | Freq: Four times a day (QID) | ORAL | 0 refills | Status: DC | PRN

## 2020-07-16 MED ORDER — ALBUTEROL SULFATE HFA 108 (90 BASE) MCG/ACT IN AERS: 2.0000 | INHALATION_SPRAY | Freq: Four times a day (QID) | RESPIRATORY_TRACT | 0 refills | Status: AC | PRN

## 2020-07-16 NOTE — TOC Transition Note (Signed)
Transition of Care [facility]) - CM/SW Discharge Note   Patient Details  Name: [name] MRN: [id number] Date of Birth: July 28, 1960  Transition of Care [facility]) CM/SW Contact:  Carles Collet, RN Phone Number: 07/16/2020, 11:24 AM   Clinical Narrative:   Spoke w patient, she confirmed she has a ride home through her spouse. Explained that she will have oxygen delivered to the unit to use on the way home and more oxygen set up at home after DC.  No other CM needs identified    Final next level of care: Home/Self Care Barriers to Discharge: No Barriers Identified   Patient Goals and CMS Choice        Discharge Placement                       Discharge Plan and Services                DME Arranged: Oxygen DME Agency: [employer] Date DME Agency Contacted: 07/16/20 Time DME Agency Contacted: [phone] Representative spoke with at DME Agency: [place] ([place] Interventions     Readmission Risk Interventions No flowsheet data found.

## 2020-07-16 NOTE — Plan of Care (Signed)

## 2020-07-16 NOTE — Evaluation (Signed)
Occupational Therapy Evaluation Patient Details Name: [name] MRN: [id number] DOB: August 10, 1960 Today's Date: 07/16/2020    History of Present Illness 60 y.o. female with medical history significant for hypertension, chronic pain, and anxiety, now presenting to the emergency department after she became poorly responsive at home. Patient reportedly tested positive for COVID-19 at home on 07/07/2020, has been experiencing fatigue, and general malaise, fevers, and shortness of breath. Pt's husband found her to be unresponsive and called EMS and pt found to have O2 saturation of 50%O2. In ED patient is found to be febrile to 38.3 C, saturating mid 90s on 6 L/min of supplemental oxygen, slightly tachypneic, and with stable blood pressure. Chest x-ray notable for interval development of bilateral patchy airspace opacities. Admitted 1/30 for treatment of Acute hypoxic respiratory failure suspected secondary to COVID-19 and acute encephalopathy.   Clinical Impression   Patient evaluated by Occupational Therapy with no further acute OT needs identified. All education has been completed and the patient has no further questions. Pt is mod I for ADLs, and was able to verbalize understanding of energy conservation strategies. See below for any follow-up Occupational Therapy or equipment needs. OT is signing off. Thank you for this referral.      Follow Up Recommendations  Supervision - Intermittent;No OT follow up    Equipment Recommendations  None recommended by OT    Recommendations for Other Services       Precautions / Restrictions Precautions Precautions: None      Mobility Bed Mobility                    Transfers Overall transfer level: Modified independent                    Balance                                           ADL either performed or assessed with clinical judgement   ADL Overall ADL's : Modified independent                                        General ADL Comments: Pt reports she showered yesterday in standing without assist and tolerated well.  Did  discuss use of shower seat if she fatigues.     Vision         Perception     Praxis      Pertinent Vitals/Pain Pain Assessment: No/denies pain     Hand Dominance Right   Extremity/Trunk Assessment Upper Extremity Assessment Upper Extremity Assessment: Generalized weakness   Lower Extremity Assessment Lower Extremity Assessment: Generalized weakness   Cervical / Trunk Assessment Cervical / Trunk Assessment: Normal   Communication Communication Communication: No difficulties   Cognition Arousal/Alertness: Awake/alert Behavior During Therapy: WFL for tasks assessed/performed Overall Cognitive Status: Within Functional Limits for tasks assessed                                 General Comments: demonstrates good awareness of deficits   General Comments  VSS on RA.  Reviewed energy conservation strategies with pt.  She was able to verbalize how to pace self.  Discussed taking frequent rest breaks, and increasing activity  slowly.  She reports family will assist with IADLs at discharge until her energy has improved    Exercises     Shoulder Instructions      Home Living Family/patient expects to be discharged to:: Private residence Living Arrangements: Spouse/significant other Available Help at Discharge: Family;Available 24 hours/day Type of Home: House Home Access: Stairs to enter [employer] of Steps: 1   Home Layout: One level     Bathroom Shower/Tub: Advertising copywriter: Yes   Home Equipment: None   Additional Comments: Pt reports she lives with her spouse who also has COVID      Prior Functioning/Environment Level of Independence: Independent        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: Decreased activity  tolerance      OT Treatment/Interventions:      OT Goals(Current goals can be found in the care plan section) Acute Rehab OT Goals Patient Stated Goal: to regain my strength OT Goal Formulation: All assessment and education complete, DC therapy  OT Frequency:     Barriers to D/C:            Co-evaluation              AM-PAC OT "6 Clicks" Daily Activity     Outcome Measure Help from another person eating meals?: None Help from another person taking care of personal grooming?: None Help from another person toileting, which includes using toliet, bedpan, or urinal?: None Help from another person bathing (including washing, rinsing, drying)?: None Help from another person to put on and taking off regular upper body clothing?: None Help from another person to put on and taking off regular lower body clothing?: None 6 Click Score: 24   End of Session    Activity Tolerance: Patient tolerated treatment well Patient left: in bed;with call bell/phone within reach;with nursing/sitter in room  OT Visit Diagnosis: Muscle weakness (generalized) (M62.81)                Time: 0277-4128 OT Time Calculation (min): 10 min Charges:  OT General Charges $OT Visit: 1 Visit OT Evaluation $OT Eval Low Complexity: 1 Low  Nilsa Nutting., OTR/L Acute Rehabilitation Services Pager [phone] Office [phone]   Lucille Passy M 07/16/2020, 3:01 PM

## 2020-07-16 NOTE — Discharge Instructions (Signed)
Person Under Monitoring Name: [name]  Location: [place] [address]   Infection Prevention Recommendations for Individuals Confirmed to have, or Being Evaluated for, 2019 Novel Coronavirus (COVID-19) Infection Who Receive Care at Home  Individuals who are confirmed to have, or are being evaluated for, COVID-19 should follow the prevention steps below until a healthcare provider or local or state health department says they can return to normal activities.  Stay home except to get medical care You should restrict activities outside your home, except for getting medical care. Do not go to work, school, or public areas, and do not use public transportation or taxis.  Call ahead before visiting your doctor Before your medical appointment, call the healthcare provider and tell them that you have, or are being evaluated for, COVID-19 infection. This will help the healthcare provider's office take steps to keep other people from getting infected. Ask your healthcare provider to call the local or state health department.  Monitor your symptoms Seek prompt medical attention if your illness is worsening (e.g., difficulty breathing). Before going to your medical appointment, call the healthcare provider and tell them that you have, or are being evaluated for, COVID-19 infection. Ask your healthcare provider to call the local or state health department.  Wear a facemask You should wear a facemask that covers your nose and mouth when you are in the same room with other people and when you visit a healthcare provider. People who live with or visit you should also wear a facemask while they are in the same room with you.  Separate yourself from other people in your home As much as possible, you should stay in a different room from other people in your home. Also, you should use a separate bathroom, if available.  Avoid sharing household items You should not  share dishes, drinking glasses, cups, eating utensils, towels, bedding, or other items with other people in your home. After using these items, you should wash them thoroughly with soap and water.  Cover your coughs and sneezes Cover your mouth and nose with a tissue when you cough or sneeze, or you can cough or sneeze into your sleeve. Throw used tissues in a lined trash can, and immediately wash your hands with soap and water for at least 20 seconds or use an alcohol-based hand rub.  Wash your [facility] your hands often and thoroughly with soap and water for at least 20 seconds. You can use an alcohol-based hand sanitizer if soap and water are not available and if your hands are not visibly dirty. Avoid touching your eyes, nose, and mouth with unwashed hands.   Prevention Steps for Caregivers and Household Members of Individuals Confirmed to have, or Being Evaluated for, COVID-19 Infection Being Cared for in the Home  If you live with, or provide care at home for, a person confirmed to have, or being evaluated for, COVID-19 infection please follow these guidelines to prevent infection:  Follow healthcare provider's instructions Make sure that you understand and can help the patient follow any healthcare provider instructions for all care.  Provide for the patient's basic needs You should help the patient with basic needs in the home and provide support for getting groceries, prescriptions, and other personal needs.  Monitor the patient's symptoms If they are getting sicker, call his or her medical provider and tell them that the patient has, or is being evaluated for, COVID-19 infection. This will help the healthcare provider's  office take steps to keep other people from getting infected. Ask the healthcare provider to call the local or state health department.  Limit the number of people who have contact with the patient  If possible, have only one caregiver for the  patient.  Other household members should stay in another home or place of residence. If this is not possible, they should stay  in another room, or be separated from the patient as much as possible. Use a separate bathroom, if available.  Restrict visitors who do not have an essential need to be in the home.  Keep older adults, very young children, and other sick people away from the patient Keep older adults, very young children, and those who have compromised immune systems or chronic health conditions away from the patient. This includes people with chronic heart, lung, or kidney conditions, diabetes, and cancer.  Ensure good ventilation Make sure that shared spaces in the home have good air flow, such as from an air conditioner or an opened window, weather permitting.  Wash your hands often  Wash your hands often and thoroughly with soap and water for at least 20 seconds. You can use an alcohol based hand sanitizer if soap and water are not available and if your hands are not visibly dirty.  Avoid touching your eyes, nose, and mouth with unwashed hands.  Use disposable paper towels to dry your hands. If not available, use dedicated cloth towels and replace them when they become wet.  Wear a facemask and gloves  Wear a disposable facemask at all times in the room and gloves when you touch or have contact with the patient's blood, body fluids, and/or secretions or excretions, such as sweat, saliva, sputum, nasal mucus, vomit, urine, or feces.  Ensure the mask fits over your nose and mouth tightly, and do not touch it during use.  Throw out disposable facemasks and gloves after using them. Do not reuse.  Wash your hands immediately after removing your facemask and gloves.  If your personal clothing becomes contaminated, carefully remove clothing and launder. Wash your hands after handling contaminated clothing.  Place all used disposable facemasks, gloves, and other waste in a lined  container before disposing them with other household waste.  Remove gloves and wash your hands immediately after handling these items.  Do not share dishes, glasses, or other household items with the patient  Avoid sharing household items. You should not share dishes, drinking glasses, cups, eating utensils, towels, bedding, or other items with a patient who is confirmed to have, or being evaluated for, COVID-19 infection.  After the person uses these items, you should wash them thoroughly with soap and water.  Wash laundry thoroughly  Immediately remove and wash clothes or bedding that have blood, body fluids, and/or secretions or excretions, such as sweat, saliva, sputum, nasal mucus, vomit, urine, or feces, on them.  Wear gloves when handling laundry from the patient.  Read and follow directions on labels of laundry or clothing items and detergent. In general, wash and dry with the warmest temperatures recommended on the label.  Clean all areas the individual has used often  Clean all touchable surfaces, such as counters, tabletops, doorknobs, bathroom fixtures, toilets, phones, keyboards, tablets, and bedside tables, every day. Also, clean any surfaces that may have blood, body fluids, and/or secretions or excretions on them.  Wear gloves when cleaning surfaces the patient has come in contact with.  Use a diluted bleach solution (e.g., dilute bleach with 1  part bleach and 10 parts water) or a household disinfectant with a label that says EPA-registered for coronaviruses. To make a bleach solution at home, add 1 tablespoon of bleach to 1 quart (4 cups) of water. For a larger supply, add  cup of bleach to 1 gallon (16 cups) of water.  Read labels of cleaning products and follow recommendations provided on product labels. Labels contain instructions for safe and effective use of the cleaning product including precautions you should take when applying the product, such as wearing gloves or  eye protection and making sure you have good ventilation during use of the product.  Remove gloves and wash hands immediately after cleaning.  Monitor yourself for signs and symptoms of illness Caregivers and household members are considered close contacts, should monitor their health, and will be asked to limit movement outside of the home to the extent possible. Follow the monitoring steps for close contacts listed on the symptom monitoring form.   ? If you have additional questions, contact your local health department or call the epidemiologist on call at [phone] (available 24/7). ? This guidance is subject to change. For the most up-to-date guidance from [facility], please refer to their website: [email or web address]

## 2020-07-16 NOTE — Discharge Summary (Addendum)
PATIENT DETAILS Name: [name] Age: 60 y.o. Sex: female Date of Birth: 1960-08-11 MRN: [id number]. Admitting Physician: Vianne Bulls, MD CBS:[id number], Thayer Jew, MD  Admit Date: 07/13/2020 Discharge date: 07/16/2020  Recommendations for Outpatient Follow-up:  1. Follow up with PCP in 1-2 weeks 2. Please obtain CMP/CBC in one week 3. Repeat Chest Xray in 4-6 week 4. Please reassess whether patient still requires home O2 at next visit.  Admitted From:  Home   Disposition: [facility]: No  Equipment/Devices: Oxygen 2L mostly with ambulation  Discharge Condition: Stable  CODE STATUS: FULL CODE  Diet recommendation:  Diet Order            Diet - low sodium heart healthy           Diet Carb Modified           Diet Heart Room service appropriate? Yes; Fluid consistency: Thin  Diet effective now                  Brief Summary: See H&P, Labs, Consult and Test reports for all details in brief, patient is a 60 year old female with history of HTN, chronic pain, anxiety-who presented to the hospital with shortness of breath, malaise-she was found to have acute metabolic encephalopathy and hypoxia due to COVID-19 pneumonia.  Brief Hospital Course: Acute Hypoxic Respiratory Failure due to COVID-19 pneumonia: Significantly improved-down to room air this morning. Treated with Remdesivir, steroids and baricitinib. She feels much better-and is requesting discharge home-ambulatory O2 sat performed-requires 2 L with ambulation.   On discharge-she will continue with tapering steroids-she will require a two-view chest x-ray to be repeated by her primary care practitioner in 4 to 6 weeks.  COVID-19 Labs:  Recent Labs    07/14/20 0233 07/15/20 0104 07/16/20 0143  DDIMER 1.32* 0.58* 0.47  CRP 15.3* 5.0* 1.5*    Lab Results  Component Value Date   SARSCOV2NAA POSITIVE (A) 91/63/8466    Acute metabolic encephalopathy: Secondary to COVID-19 related  hypoxemia-completely awake and alert.  HTN: BP stable-continue amlodipine-apparently she is no longer taking lisinopril.  HLD: Continue Zetia  GERD: Continue PPI  DM-2: CBG stable-continue Ozempic  Anxiety/chronic pain: Narcotics/benzos were initially held on admission-but once patient's mentation improved-they have been resumed-hence plan to resume her usual home regimen of as needed Xanax, Lyrica, venlafaxine and her usual narcotic regimen on discharge (tolerating OxyContin 20 mg every 12 hours while in the hospital).   Obesity: Estimated body mass index is 34.27 kg/m as calculated from the following:   Height as of this encounter: 5' 5"  (1.651 m).   Weight as of this encounter: 93.4 kg.   Discharge Diagnoses:  Principal Problem:   Acute respiratory failure due to COVID-19 [facility]) Active Problems:   SYNDROME, CHRONIC PAIN   Essential hypertension   Anxiety   Acute encephalopathy   Renal insufficiency   Discharge Instructions:    Person Under Monitoring Name: BRIELYN BOSAK  Location: [place] [address]   Infection Prevention Recommendations for Individuals Confirmed to have, or Being Evaluated for, 2019 Novel Coronavirus (COVID-19) Infection Who Receive Care at Home  Individuals who are confirmed to have, or are being evaluated for, COVID-19 should follow the prevention steps below until a healthcare provider or local or state health department says they can return to normal activities.  Stay home except to get medical care You should restrict activities outside your home, except for getting medical care. Do not  go to work, school, or public areas, and do not use public transportation or taxis.  Call ahead before visiting your doctor Before your medical appointment, call the healthcare provider and tell them that you have, or are being evaluated for, COVID-19 infection. This will help the healthcare provider's office take steps to keep other  people from getting infected. Ask your healthcare provider to call the local or state health department.  Monitor your symptoms Seek prompt medical attention if your illness is worsening (e.g., difficulty breathing). Before going to your medical appointment, call the healthcare provider and tell them that you have, or are being evaluated for, COVID-19 infection. Ask your healthcare provider to call the local or state health department.  Wear a facemask You should wear a facemask that covers your nose and mouth when you are in the same room with other people and when you visit a healthcare provider. People who live with or visit you should also wear a facemask while they are in the same room with you.  Separate yourself from other people in your home As much as possible, you should stay in a different room from other people in your home. Also, you should use a separate bathroom, if available.  Avoid sharing household items You should not share dishes, drinking glasses, cups, eating utensils, towels, bedding, or other items with other people in your home. After using these items, you should wash them thoroughly with soap and water.  Cover your coughs and sneezes Cover your mouth and nose with a tissue when you cough or sneeze, or you can cough or sneeze into your sleeve. Throw used tissues in a lined trash can, and immediately wash your hands with soap and water for at least 20 seconds or use an alcohol-based hand rub.  Wash your [facility] your hands often and thoroughly with soap and water for at least 20 seconds. You can use an alcohol-based hand sanitizer if soap and water are not available and if your hands are not visibly dirty. Avoid touching your eyes, nose, and mouth with unwashed hands.   Prevention Steps for Caregivers and Household Members of Individuals Confirmed to have, or Being Evaluated for, COVID-19 Infection Being Cared for in the Home  If you live with, or provide  care at home for, a person confirmed to have, or being evaluated for, COVID-19 infection please follow these guidelines to prevent infection:  Follow healthcare provider's instructions Make sure that you understand and can help the patient follow any healthcare provider instructions for all care.  Provide for the patient's basic needs You should help the patient with basic needs in the home and provide support for getting groceries, prescriptions, and other personal needs.  Monitor the patient's symptoms If they are getting sicker, call his or her medical provider and tell them that the patient has, or is being evaluated for, COVID-19 infection. This will help the healthcare provider's office take steps to keep other people from getting infected. Ask the healthcare provider to call the local or state health department.  Limit the number of people who have contact with the patient  If possible, have only one caregiver for the patient.  Other household members should stay in another home or place of residence. If this is not possible, they should stay  in another room, or be separated from the patient as much as possible. Use a separate bathroom, if available.  Restrict visitors who do not have an essential need to be in the  home.  Keep older adults, very young children, and other sick people away from the patient Keep older adults, very young children, and those who have compromised immune systems or chronic health conditions away from the patient. This includes people with chronic heart, lung, or kidney conditions, diabetes, and cancer.  Ensure good ventilation Make sure that shared spaces in the home have good air flow, such as from an air conditioner or an opened window, weather permitting.  Wash your hands often  Wash your hands often and thoroughly with soap and water for at least 20 seconds. You can use an alcohol based hand sanitizer if soap and water are not available and if  your hands are not visibly dirty.  Avoid touching your eyes, nose, and mouth with unwashed hands.  Use disposable paper towels to dry your hands. If not available, use dedicated cloth towels and replace them when they become wet.  Wear a facemask and gloves  Wear a disposable facemask at all times in the room and gloves when you touch or have contact with the patient's blood, body fluids, and/or secretions or excretions, such as sweat, saliva, sputum, nasal mucus, vomit, urine, or feces.  Ensure the mask fits over your nose and mouth tightly, and do not touch it during use.  Throw out disposable facemasks and gloves after using them. Do not reuse.  Wash your hands immediately after removing your facemask and gloves.  If your personal clothing becomes contaminated, carefully remove clothing and launder. Wash your hands after handling contaminated clothing.  Place all used disposable facemasks, gloves, and other waste in a lined container before disposing them with other household waste.  Remove gloves and wash your hands immediately after handling these items.  Do not share dishes, glasses, or other household items with the patient  Avoid sharing household items. You should not share dishes, drinking glasses, cups, eating utensils, towels, bedding, or other items with a patient who is confirmed to have, or being evaluated for, COVID-19 infection.  After the person uses these items, you should wash them thoroughly with soap and water.  Wash laundry thoroughly  Immediately remove and wash clothes or bedding that have blood, body fluids, and/or secretions or excretions, such as sweat, saliva, sputum, nasal mucus, vomit, urine, or feces, on them.  Wear gloves when handling laundry from the patient.  Read and follow directions on labels of laundry or clothing items and detergent. In general, wash and dry with the warmest temperatures recommended on the label.  Clean all areas the  individual has used often  Clean all touchable surfaces, such as counters, tabletops, doorknobs, bathroom fixtures, toilets, phones, keyboards, tablets, and bedside tables, every day. Also, clean any surfaces that may have blood, body fluids, and/or secretions or excretions on them.  Wear gloves when cleaning surfaces the patient has come in contact with.  Use a diluted bleach solution (e.g., dilute bleach with 1 part bleach and 10 parts water) or a household disinfectant with a label that says EPA-registered for coronaviruses. To make a bleach solution at home, add 1 tablespoon of bleach to 1 quart (4 cups) of water. For a larger supply, add  cup of bleach to 1 gallon (16 cups) of water.  Read labels of cleaning products and follow recommendations provided on product labels. Labels contain instructions for safe and effective use of the cleaning product including precautions you should take when applying the product, such as wearing gloves or eye protection and making sure you have  good ventilation during use of the product.  Remove gloves and wash hands immediately after cleaning.  Monitor yourself for signs and symptoms of illness Caregivers and household members are considered close contacts, should monitor their health, and will be asked to limit movement outside of the home to the extent possible. Follow the monitoring steps for close contacts listed on the symptom monitoring form.   ? If you have additional questions, contact your local health department or call the epidemiologist on call at [phone] (available 24/7). ? This guidance is subject to change. For the most up-to-date guidance from CDC, please refer to their website: YouBlogs.pl    Activity:  As tolerated  Discharge Instructions    Call MD for:  difficulty breathing, headache or visual disturbances   Complete by: As directed    Diet - low sodium heart  healthy   Complete by: As directed    Diet Carb Modified   Complete by: As directed    Discharge instructions   Complete by: As directed    Follow with Primary MD  Deland Pretty, MD in 1-2 weeks  Please get a complete blood count and chemistry panel checked by your Primary MD at your next visit, and again as instructed by your Primary MD.  Get Medicines reviewed and adjusted: Please take all your medications with you for your next visit with your Primary MD  Laboratory/radiological data: Please request your Primary MD to go over all hospital tests and procedure/radiological results at the follow up, please ask your Primary MD to get all Hospital records sent to his/her office.  In some cases, they will be blood work, cultures and biopsy results pending at the time of your discharge. Please request that your primary care M.D. follows up on these results.  Also Note the following: If you experience worsening of your admission symptoms, develop shortness of breath, life threatening emergency, suicidal or homicidal thoughts you must seek medical attention immediately by calling 911 or calling your MD immediately  if symptoms less severe.  You must read complete instructions/literature along with all the possible adverse reactions/side effects for all the Medicines you take and that have been prescribed to you. Take any new Medicines after you have completely understood and accpet all the possible adverse reactions/side effects.   Do not drive when taking Pain medications or sleeping medications (Benzodaizepines)  Do not take more than prescribed Pain, Sleep and Anxiety Medications. It is not advisable to combine anxiety,sleep and pain medications without talking with your primary care practitioner  Special Instructions: If you have smoked or chewed Tobacco  in the last 2 yrs please stop smoking, stop any regular Alcohol  and or any Recreational drug use.  Wear Seat belts while  driving.  Please note: You were cared for by a hospitalist during your hospital stay. Once you are discharged, your primary care physician will handle any further medical issues. Please note that NO REFILLS for any discharge medications will be authorized once you are discharged, as it is imperative that you return to your primary care physician (or establish a relationship with a primary care physician if you do not have one) for your post hospital discharge needs so that they can reassess your need for medications and monitor your lab values.   1.) 21 days of isolation from the day of your first positive test or from first day of your symptoms.  2.) Please ask your primary care practitioner to reassess whether you require home oxygen at  next visit  3.) Please ask your primary care practitioner to do a two-view chest x-ray in 4 to 6 weeks to document resolution of pneumonia  4.) Please seek immediate medical attention if your shortness of breath worsens.   Increase activity slowly   Complete by: As directed      Allergies as of 07/16/2020      Reactions   Codeine Phosphate    REACTION: unspecified   Simvastatin    REACTION: muscle \T\ joint pains   Sulfamethoxazole    REACTION: unspecified      Medication List    STOP taking these medications   estradiol 2 MG tablet Commonly known as: ESTRACE   lisinopril 5 MG tablet Commonly known as: ZESTRIL     TAKE these medications   albuterol 108 (90 Base) MCG/ACT inhaler Commonly known as: VENTOLIN HFA Inhale 2 puffs into the lungs every 6 (six) hours as needed for wheezing or shortness of breath.   ALPRAZolam 0.25 MG tablet Commonly known as: XANAX Take 0.25 mg by mouth 2 (two) times daily as needed for anxiety.   amLODipine 10 MG tablet Commonly known as: NORVASC Take 10 mg by mouth daily.   benzonatate 100 MG capsule Commonly known as: Tessalon Perles Take 1 capsule (100 mg total) by mouth every 8 (eight) hours as needed for  cough.   dexlansoprazole 60 MG capsule Commonly known as: DEXILANT Take 60 mg by mouth daily.   ezetimibe 10 MG tablet Commonly known as: ZETIA Take 10 mg by mouth daily.   lidocaine 5 % Commonly known as: Lidoderm Place 3 patches onto the skin daily. Remove & Discard patch within 12 hours or as directed by MD What changed:   when to take this  reasons to take this  additional instructions   loratadine 10 MG tablet Commonly known as: CLARITIN Take 10 mg by mouth daily. For allergies   metFORMIN 500 MG 24 hr tablet Commonly known as: GLUCOPHAGE-XR Take 500 mg by mouth 2 (two) times daily.   Ozempic (0.25 or 0.5 MG/DOSE) 2 MG/1.5ML Sopn Generic drug: Semaglutide(0.25 or 0.5MG/DOS) Inject 2 mg into the skin once a week. Saturday   predniSONE 10 MG tablet Commonly known as: DELTASONE Take 40 mg daily for 2 days, 30 mg daily for 2 days, 20 mg daily for 2 days,10 mg daily for 1 days, then stop   pregabalin 100 MG capsule Commonly known as: LYRICA Take 100 mg by mouth 2 (two) times daily.   venlafaxine XR 37.5 MG 24 hr capsule Commonly known as: EFFEXOR-XR Take 37.5 mg by mouth daily.   Xtampza ER 36 MG C12a Generic drug: oxyCODONE ER Take 36 mg by mouth in the morning and at bedtime.            Durable Medical Equipment  (From admission, onward)         Start     Ordered   07/16/20 0935  For home use only DME oxygen  Once       Question Answer Comment  Length of Need 6 Months   Mode or (Route) Nasal cannula   Liters per Minute 2   Frequency Continuous (stationary and portable oxygen unit needed)   Oxygen delivery system Gas      07/16/20 0935          Follow-up Information    Deland Pretty, MD. Schedule an appointment as soon as possible for a visit in 1 week(s).   Specialty: Internal Medicine Contact  information: [address] [place] [address] [phone]              Allergies  Allergen Reactions  . Codeine  Phosphate     REACTION: unspecified  . Simvastatin     REACTION: muscle \T\ joint pains  . Sulfamethoxazole     REACTION: unspecified     Other Procedures/Studies: DG Chest Port 1 View  Result Date: 07/13/2020 CLINICAL DATA:  Stroke EXAM: PORTABLE CHEST 1 VIEW.  Patient is rotated. COMPARISON:  Chest x-ray 08/10/2010 FINDINGS: Prominent cardiac silhouette which may be due to AP portable technique. Otherwise the heart size and mediastinal contours are unchanged. Poor delineation of the ascending aortic arch may be due to rotation. Interval development of bilateral lower lung zone patchy airspace opacities. No pulmonary edema. No pleural effusion. No pneumothorax. No acute osseous abnormality. Suggestion of right neck atherosclerotic plaque. IMPRESSION: 1. Interval development of bilateral lower lung zone patchy airspace opacities could represent infection versus inflammation (aspiration pneumonia). COVID 19 infection not excluded. 2. Poor delineation of the ascending aortic arch may be due to rotation. Consider repeat chest x-ray. Electronically Signed   By: Iven Finn M.D.   On: 07/13/2020 03:07   CT HEAD CODE STROKE WO CONTRAST  Result Date: 07/13/2020 CLINICAL DATA:  Code stroke. Initial evaluation for acute unresponsiveness. EXAM: CT HEAD WITHOUT CONTRAST TECHNIQUE: Contiguous axial images were obtained from the base of the skull through the vertex without intravenous contrast. COMPARISON:  None. FINDINGS: Brain: Cerebral volume within normal limits. No acute intracranial hemorrhage. No acute large vessel territory infarct. No mass lesion, midline shift or mass effect. No hydrocephalus or extra-axial fluid collection. Vascular: No hyperdense vessel. Skull: Scalp soft tissues and calvarium within normal limits. Sinuses/Orbits: Globes and orbital soft tissues within normal limits. Scattered mucosal thickening noted throughout the paranasal sinuses. No mastoid effusion. Other: None. ASPECTS  [facility] Stroke Program Early CT Score) - Ganglionic level infarction (caudate, lentiform nuclei, internal capsule, insula, M1-M3 cortex): 7 - Supraganglionic infarction (M4-M6 cortex): 3 Total score (0-10 with 10 being normal): 10 IMPRESSION: 1. No acute intracranial infarct or other abnormality. 2. ASPECTS is 10. These results were communicated to Dr. Cheral Marker at 2:34 amon 1/30/2022by text page via the [facility] messaging system. Electronically Signed   By: Jeannine Boga M.D.   On: 07/13/2020 02:34     TODAY-DAY OF DISCHARGE:  Subjective:   Dacota Devall today has no headache,no chest abdominal pain,no new weakness tingling or numbness, feels much better wants to go home today.  Objective:   Blood pressure (!) 158/88, pulse 75, temperature 98 F (36.7 C), temperature source Oral, resp. rate 19, height 5' 5"  (1.651 m), weight 93.4 kg, last menstrual period 04/22/1997, SpO2 97 %.  Intake/Output Summary (Last 24 hours) at 07/16/2020 1114 Last data filed at 07/15/2020 1300 Gross per 24 hour  Intake 480 ml  Output --  Net 480 ml   Filed Weights   07/13/20 1100 07/13/20 2043  Weight: 93.4 kg 93.4 kg    Exam: Awake Alert, Oriented *3, No new F.N deficits, Normal affect [place].AT,PERRAL Supple Neck,No JVD, No cervical lymphadenopathy appriciated.  Symmetrical Chest wall movement, Good air movement bilaterally, CTAB RRR,No Gallops,Rubs or new Murmurs, No Parasternal Heave +ve B.Sounds, Abd Soft, Non tender, No organomegaly appriciated, No rebound -guarding or rigidity. No Cyanosis, Clubbing or edema, No new Rash or bruise   PERTINENT RADIOLOGIC STUDIES: DG Chest Port 1 View  Result Date: 07/13/2020 CLINICAL DATA:  Stroke EXAM:  PORTABLE CHEST 1 VIEW.  Patient is rotated. COMPARISON:  Chest x-ray 08/10/2010 FINDINGS: Prominent cardiac silhouette which may be due to AP portable technique. Otherwise the heart size and mediastinal contours are unchanged. Poor delineation of the ascending aortic  arch may be due to rotation. Interval development of bilateral lower lung zone patchy airspace opacities. No pulmonary edema. No pleural effusion. No pneumothorax. No acute osseous abnormality. Suggestion of right neck atherosclerotic plaque. IMPRESSION: 1. Interval development of bilateral lower lung zone patchy airspace opacities could represent infection versus inflammation (aspiration pneumonia). COVID 19 infection not excluded. 2. Poor delineation of the ascending aortic arch may be due to rotation. Consider repeat chest x-ray. Electronically Signed   By: Iven Finn M.D.   On: 07/13/2020 03:07   CT HEAD CODE STROKE WO CONTRAST  Result Date: 07/13/2020 CLINICAL DATA:  Code stroke. Initial evaluation for acute unresponsiveness. EXAM: CT HEAD WITHOUT CONTRAST TECHNIQUE: Contiguous axial images were obtained from the base of the skull through the vertex without intravenous contrast. COMPARISON:  None. FINDINGS: Brain: Cerebral volume within normal limits. No acute intracranial hemorrhage. No acute large vessel territory infarct. No mass lesion, midline shift or mass effect. No hydrocephalus or extra-axial fluid collection. Vascular: No hyperdense vessel. Skull: Scalp soft tissues and calvarium within normal limits. Sinuses/Orbits: Globes and orbital soft tissues within normal limits. Scattered mucosal thickening noted throughout the paranasal sinuses. No mastoid effusion. Other: None. ASPECTS [facility] Stroke Program Early CT Score) - Ganglionic level infarction (caudate, lentiform nuclei, internal capsule, insula, M1-M3 cortex): 7 - Supraganglionic infarction (M4-M6 cortex): 3 Total score (0-10 with 10 being normal): 10 IMPRESSION: 1. No acute intracranial infarct or other abnormality. 2. ASPECTS is 10. These results were communicated to Dr. Cheral Marker at 2:34 amon 1/30/2022by text page via the [facility] messaging system. Electronically Signed   By: Jeannine Boga M.D.   On: 07/13/2020 02:34      PERTINENT LAB RESULTS: CBC: Recent Labs    07/15/20 0104 07/16/20 0143  WBC 5.0 5.2  HGB 12.8 13.0  HCT 38.3 40.7  PLT 223 228   CMET CMP     Component Value Date/Time   NA 136 07/16/2020 0143   K 4.8 07/16/2020 0143   CL 100 07/16/2020 0143   CO2 26 07/16/2020 0143   GLUCOSE 189 (H) 07/16/2020 0143   GLUCOSE 109 (H) 06/10/2006 1007   BUN 23 07/16/2020 0143   CREATININE 0.93 07/16/2020 0143   CALCIUM 8.5 (L) 07/16/2020 0143   PROT 5.7 (L) 07/16/2020 0143   ALBUMIN 2.7 (L) 07/16/2020 0143   AST 41 07/16/2020 0143   ALT 59 (H) 07/16/2020 0143   ALKPHOS 45 07/16/2020 0143   BILITOT 0.7 07/16/2020 0143   GFRNONAA >60 07/16/2020 0143   GFRAA  08/11/2010 0505    >60        The eGFR has been calculated using the MDRD equation. This calculation has not been validated in all clinical situations. eGFR's persistently <60 mL/min signify possible Chronic Kidney Disease.    GFR Estimated Creatinine Clearance: 70.9 mL/min (by C-G formula based on SCr of 0.93 mg/dL). No results for input(s): LIPASE, AMYLASE in the last 72 hours. No results for input(s): CKTOTAL, CKMB, CKMBINDEX, TROPONINI in the last 72 hours. Invalid input(s): POCBNP Recent Labs    07/15/20 0104 07/16/20 0143  DDIMER 0.58* 0.47   Recent Labs    07/14/20 1412  HGBA1C 6.7*   No results for input(s): CHOL, HDL, LDLCALC, TRIG, CHOLHDL, LDLDIRECT in the last  72 hours. No results for input(s): TSH, T4TOTAL, T3FREE, THYROIDAB in the last 72 hours.  Invalid input(s): FREET3 No results for input(s): VITAMINB12, FOLATE, FERRITIN, TIBC, IRON, RETICCTPCT in the last 72 hours. Coags: No results for input(s): INR in the last 72 hours.  Invalid input(s): PT Microbiology: Recent Results (from the past 240 hour(s))  Blood Culture (routine x 2)     Status: None (Preliminary result)   Collection Time: 07/13/20  3:06 AM   Specimen: BLOOD  Result Value Ref Range Status   Specimen Description BLOOD SITE  NOT SPECIFIED  Final   Special Requests   Final    BOTTLES DRAWN AEROBIC AND ANAEROBIC Blood Culture results may not be optimal due to an excessive volume of blood received in culture bottles   Culture   Final    NO GROWTH 3 DAYS Performed at [facility], [place] [address], [place], [place] [address]    Report Status PENDING  Incomplete  Blood Culture (routine x 2)     Status: None (Preliminary result)   Collection Time: 07/13/20  3:07 AM   Specimen: BLOOD  Result Value Ref Range Status   Specimen Description BLOOD SITE NOT SPECIFIED  Final   Special Requests   Final    BOTTLES DRAWN AEROBIC ONLY Blood Culture results may not be optimal due to an inadequate volume of blood received in culture bottles   Culture   Final    NO GROWTH 3 DAYS Performed at [facility], [place] [address], [place], [place] [address]    Report Status PENDING  Incomplete  SARS Coronavirus 2 by RT PCR (hospital order, performed in [facility] hospital lab) Nasopharyngeal Nasopharyngeal Swab     Status: Abnormal   Collection Time: 07/13/20  3:30 AM   Specimen: Nasopharyngeal Swab  Result Value Ref Range Status   SARS Coronavirus 2 POSITIVE (A) NEGATIVE Final    Comment: RESULT CALLED TO, READ BACK BY AND VERIFIED WITH: K. ISLEY,RN 0501 07/13/2020 T. TYSOR (NOTE) SARS-CoV-2 target nucleic acids are DETECTED  SARS-CoV-2 RNA is generally detectable in upper respiratory specimens  during the acute phase of infection.  Positive results are indicative  of the presence of the identified virus, but do not rule out bacterial infection or co-infection with other pathogens not detected by the test.  Clinical correlation with patient history and  other diagnostic information is necessary to determine patient infection status.  The expected result is negative.  Fact Sheet for Patients:   StrictlyIdeas.no   Fact Sheet for Healthcare Providers:    BankingDealers.co.za    This test is not yet approved or cleared by the [place] FDA and  has been authorized for detection and/or diagnosis of SARS-CoV-2 by FDA under an Emergency Use Authorization (EUA).  This EUA will remain in effect (meaning this  test can be used) for the duration of  the COVID-19 declaration under Section 564(b)(1) of the Act, 21 U.S.C. section 360-bbb-3(b)(1), unless the authorization is terminated or revoked sooner.  Performed at [facility], [place] [address], [place], [place] [address]   MRSA PCR Screening     Status: None   Collection Time: 07/14/20  3:59 AM   Specimen: Nasal Mucosa; Nasopharyngeal  Result Value Ref Range Status   MRSA by PCR NEGATIVE NEGATIVE Final    Comment:        The GeneXpert MRSA Assay (FDA approved for NASAL specimens only), is one component of a comprehensive MRSA colonization  surveillance program. It is not intended to diagnose MRSA infection nor to guide or monitor treatment for MRSA infections. Performed at [facility], [place] [address], [place], [place] [address]     FURTHER DISCHARGE INSTRUCTIONS:  Get Medicines reviewed and adjusted: Please take all your medications with you for your next visit with your Primary MD  Laboratory/radiological data: Please request your Primary MD to go over all hospital tests and procedure/radiological results at the follow up, please ask your Primary MD to get all Hospital records sent to his/her office.  In some cases, they will be blood work, cultures and biopsy results pending at the time of your discharge. Please request that your primary care M.D. goes through all the records of your hospital data and follows up on these results.  Also Note the following: If you experience worsening of your admission symptoms, develop shortness of breath, life threatening emergency, suicidal or homicidal thoughts you must seek medical attention immediately by  calling 911 or calling your MD immediately  if symptoms less severe.  You must read complete instructions/literature along with all the possible adverse reactions/side effects for all the Medicines you take and that have been prescribed to you. Take any new Medicines after you have completely understood and accpet all the possible adverse reactions/side effects.   Do not drive when taking Pain medications or sleeping medications (Benzodaizepines)  Do not take more than prescribed Pain, Sleep and Anxiety Medications. It is not advisable to combine anxiety,sleep and pain medications without talking with your primary care practitioner  Special Instructions: If you have smoked or chewed Tobacco  in the last 2 yrs please stop smoking, stop any regular Alcohol  and or any Recreational drug use.  Wear Seat belts while driving.  Please note: You were cared for by a hospitalist during your hospital stay. Once you are discharged, your primary care physician will handle any further medical issues. Please note that NO REFILLS for any discharge medications will be authorized once you are discharged, as it is imperative that you return to your primary care physician (or establish a relationship with a primary care physician if you do not have one) for your post hospital discharge needs so that they can reassess your need for medications and monitor your lab values.  Total Time spent coordinating discharge including counseling, education and face to face time equals 35 minutes.  SignedOren Binet 07/16/2020 11:14 AM

## 2020-07-16 NOTE — Progress Notes (Signed)
SATURATION QUALIFICATIONS: (This note is used to comply with regulatory documentation for home oxygen)  Patient Saturations on Room Air at Rest = 92%  Patient Saturations on Room Air while Ambulating =84%  Patient Saturations on 2 Liters of oxygen while Ambulating = 90%  Please briefly explain why patient needs home oxygen: Patient needs oxygen while ambulating to maintain safe oxygen levels.

## 2020-07-18 LAB — CULTURE, BLOOD (ROUTINE X 2)
Culture: NO GROWTH
Culture: NO GROWTH

## 2020-07-18 NOTE — Progress Notes (Signed)
Talked to [place] with [facility] [employer]; attempted delivery of Oxygen to the home but the patient refused to accept the oxygen. I attempted to call the patient at home, no answer.  [place] Supervisor [phone]

## 2020-07-21 DIAGNOSIS — D508 Other iron deficiency anemias: Secondary | ICD-10-CM | POA: Diagnosis not present

## 2020-07-21 DIAGNOSIS — E559 Vitamin D deficiency, unspecified: Secondary | ICD-10-CM | POA: Diagnosis not present

## 2020-07-21 DIAGNOSIS — I10 Essential (primary) hypertension: Secondary | ICD-10-CM | POA: Diagnosis not present

## 2020-07-21 DIAGNOSIS — Z09 Encounter for follow-up examination after completed treatment for conditions other than malignant neoplasm: Secondary | ICD-10-CM | POA: Diagnosis not present

## 2020-07-30 DIAGNOSIS — G894 Chronic pain syndrome: Secondary | ICD-10-CM | POA: Diagnosis not present

## 2020-07-30 DIAGNOSIS — M961 Postlaminectomy syndrome, not elsewhere classified: Secondary | ICD-10-CM | POA: Diagnosis not present

## 2020-07-30 DIAGNOSIS — Z79891 Long term (current) use of opiate analgesic: Secondary | ICD-10-CM | POA: Diagnosis not present

## 2020-07-30 DIAGNOSIS — M150 Primary generalized (osteo)arthritis: Secondary | ICD-10-CM | POA: Diagnosis not present

## 2020-08-13 DIAGNOSIS — U071 COVID-19: Secondary | ICD-10-CM | POA: Diagnosis not present

## 2020-08-13 DIAGNOSIS — J9600 Acute respiratory failure, unspecified whether with hypoxia or hypercapnia: Secondary | ICD-10-CM | POA: Diagnosis not present

## 2020-09-13 DIAGNOSIS — U071 COVID-19: Secondary | ICD-10-CM | POA: Diagnosis not present

## 2020-09-13 DIAGNOSIS — J9600 Acute respiratory failure, unspecified whether with hypoxia or hypercapnia: Secondary | ICD-10-CM | POA: Diagnosis not present

## 2020-09-16 DIAGNOSIS — R946 Abnormal results of thyroid function studies: Secondary | ICD-10-CM | POA: Diagnosis not present

## 2020-09-16 DIAGNOSIS — E559 Vitamin D deficiency, unspecified: Secondary | ICD-10-CM | POA: Diagnosis not present

## 2020-09-16 DIAGNOSIS — E7800 Pure hypercholesterolemia, unspecified: Secondary | ICD-10-CM | POA: Diagnosis not present

## 2020-09-16 DIAGNOSIS — I10 Essential (primary) hypertension: Secondary | ICD-10-CM | POA: Diagnosis not present

## 2020-09-16 DIAGNOSIS — R7301 Impaired fasting glucose: Secondary | ICD-10-CM | POA: Diagnosis not present

## 2020-09-23 DIAGNOSIS — E559 Vitamin D deficiency, unspecified: Secondary | ICD-10-CM | POA: Diagnosis not present

## 2020-09-23 DIAGNOSIS — E7800 Pure hypercholesterolemia, unspecified: Secondary | ICD-10-CM | POA: Diagnosis not present

## 2020-09-23 DIAGNOSIS — Z0000 Encounter for general adult medical examination without abnormal findings: Secondary | ICD-10-CM | POA: Diagnosis not present

## 2020-09-29 DIAGNOSIS — Z79891 Long term (current) use of opiate analgesic: Secondary | ICD-10-CM | POA: Diagnosis not present

## 2020-09-29 DIAGNOSIS — G894 Chronic pain syndrome: Secondary | ICD-10-CM | POA: Diagnosis not present

## 2020-09-29 DIAGNOSIS — M961 Postlaminectomy syndrome, not elsewhere classified: Secondary | ICD-10-CM | POA: Diagnosis not present

## 2020-09-29 DIAGNOSIS — M150 Primary generalized (osteo)arthritis: Secondary | ICD-10-CM | POA: Diagnosis not present

## 2020-10-13 DIAGNOSIS — J9600 Acute respiratory failure, unspecified whether with hypoxia or hypercapnia: Secondary | ICD-10-CM | POA: Diagnosis not present

## 2020-10-13 DIAGNOSIS — U071 COVID-19: Secondary | ICD-10-CM | POA: Diagnosis not present

## 2020-11-27 DIAGNOSIS — Z79891 Long term (current) use of opiate analgesic: Secondary | ICD-10-CM | POA: Diagnosis not present

## 2020-11-27 DIAGNOSIS — M961 Postlaminectomy syndrome, not elsewhere classified: Secondary | ICD-10-CM | POA: Diagnosis not present

## 2020-11-27 DIAGNOSIS — M150 Primary generalized (osteo)arthritis: Secondary | ICD-10-CM | POA: Diagnosis not present

## 2020-11-27 DIAGNOSIS — G894 Chronic pain syndrome: Secondary | ICD-10-CM | POA: Diagnosis not present

## 2021-01-26 DIAGNOSIS — G894 Chronic pain syndrome: Secondary | ICD-10-CM | POA: Diagnosis not present

## 2021-01-26 DIAGNOSIS — M150 Primary generalized (osteo)arthritis: Secondary | ICD-10-CM | POA: Diagnosis not present

## 2021-01-26 DIAGNOSIS — M961 Postlaminectomy syndrome, not elsewhere classified: Secondary | ICD-10-CM | POA: Diagnosis not present

## 2021-01-26 DIAGNOSIS — Z79891 Long term (current) use of opiate analgesic: Secondary | ICD-10-CM | POA: Diagnosis not present

## 2021-03-30 DIAGNOSIS — M150 Primary generalized (osteo)arthritis: Secondary | ICD-10-CM | POA: Diagnosis not present

## 2021-03-30 DIAGNOSIS — G894 Chronic pain syndrome: Secondary | ICD-10-CM | POA: Diagnosis not present

## 2021-03-30 DIAGNOSIS — Z79891 Long term (current) use of opiate analgesic: Secondary | ICD-10-CM | POA: Diagnosis not present

## 2021-03-30 DIAGNOSIS — M961 Postlaminectomy syndrome, not elsewhere classified: Secondary | ICD-10-CM | POA: Diagnosis not present

## 2021-05-07 IMAGING — DX DG CHEST 1V PORT
1 series · 1 of 1 positions shown · non-contrast
Comparison: Chest x-ray 08/10/2010

CLINICAL DATA: Stroke

EXAM:
PORTABLE CHEST 1 VIEW.  Patient is rotated.

[chest ap]
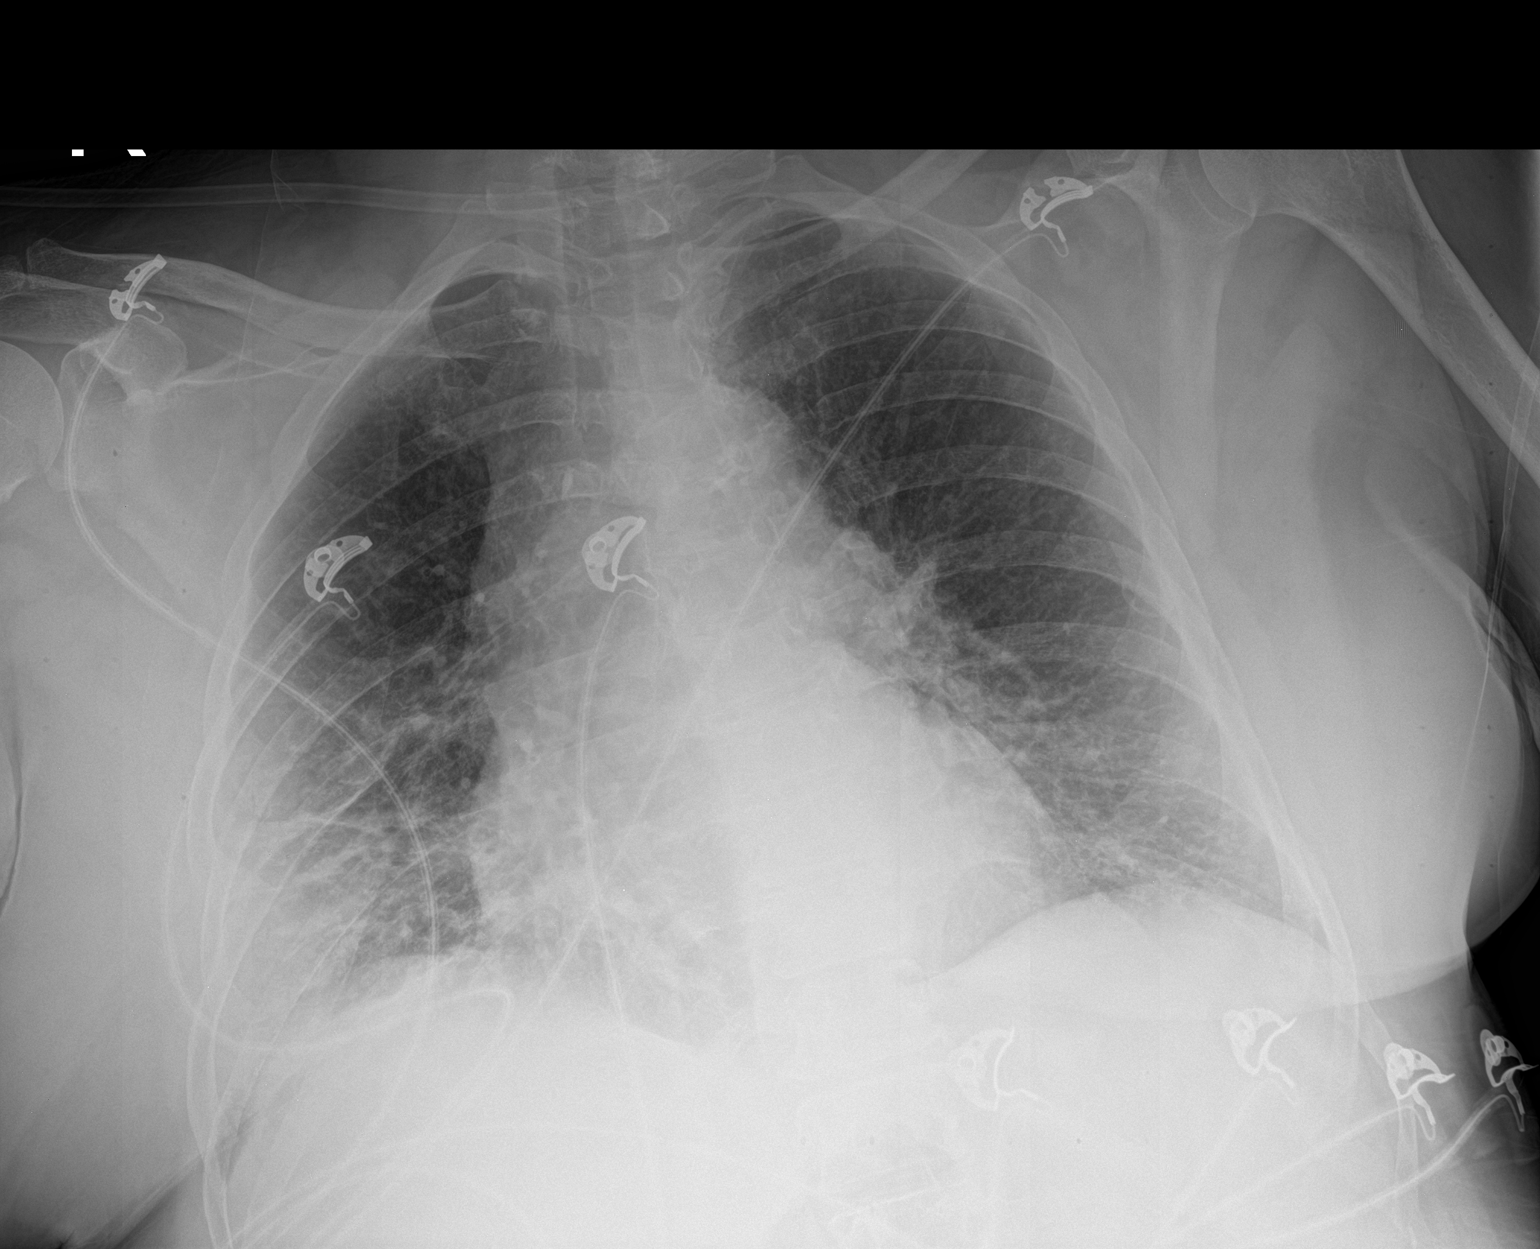

[1 of 1 positions shown; findings below may reference images not displayed]

FINDINGS: Prominent cardiac silhouette which may be due to AP portable
technique. Otherwise the heart size and mediastinal contours are
unchanged. Poor delineation of the ascending aortic arch may be due
to rotation.

Interval development of bilateral lower lung zone patchy airspace
opacities. No pulmonary edema. No pleural effusion. No pneumothorax.

No acute osseous abnormality.

Suggestion of right neck atherosclerotic plaque.
IMPRESSION: 1. Interval development of bilateral lower lung zone patchy airspace
opacities could represent infection versus inflammation (aspiration
pneumonia). COVID 19 infection not excluded.
2. Poor delineation of the ascending aortic arch may be due to
rotation. Consider repeat chest x-ray.

## 2021-05-07 IMAGING — CT CT HEAD CODE STROKE
3 series · 14 of 47 positions shown, 16 images · non-contrast
Comparison: None.

CLINICAL DATA: Code stroke. Initial evaluation for acute
unresponsiveness.

EXAM:
CT HEAD WITHOUT CONTRAST
TECHNIQUE: Contiguous axial images were obtained from the base of the skull
through the vertex without intravenous contrast.

[Series 3: head 5.0 h30s · axial · 0.42mm/px · z∈[-134,+6]mm · 8 of 34 slices shown, 10 images]
[im 3/34  brain]
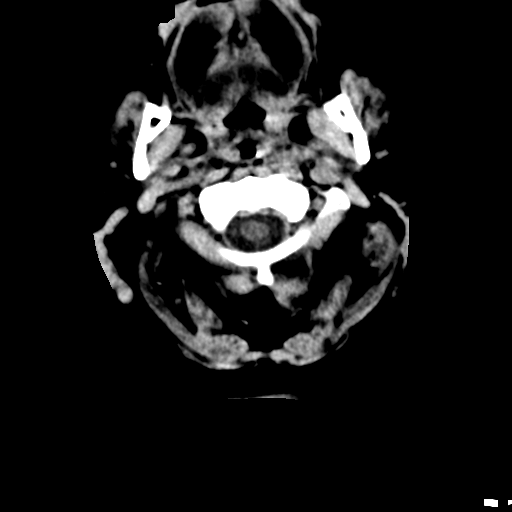
[im 3/34  bone]
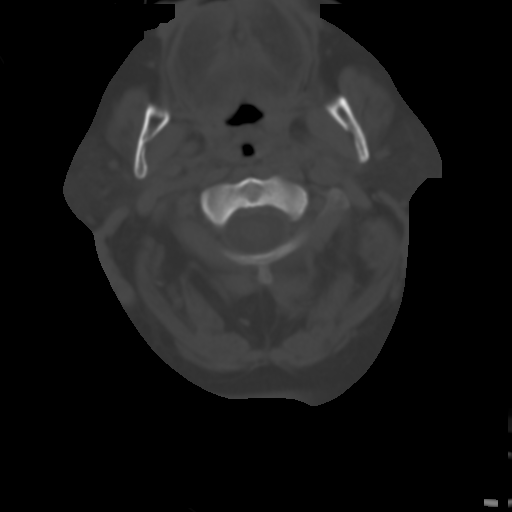
[im 7/34  brain]
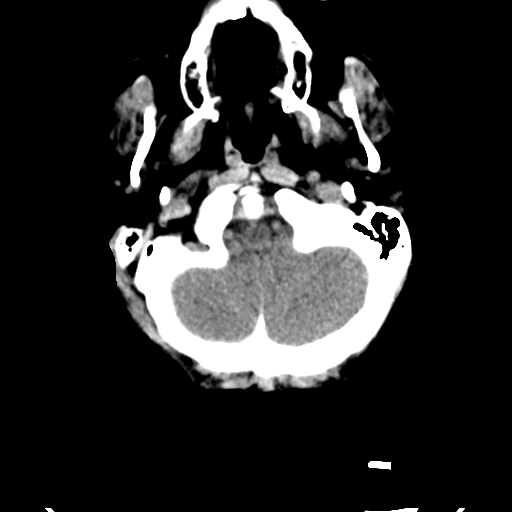
[im 11/34  brain]
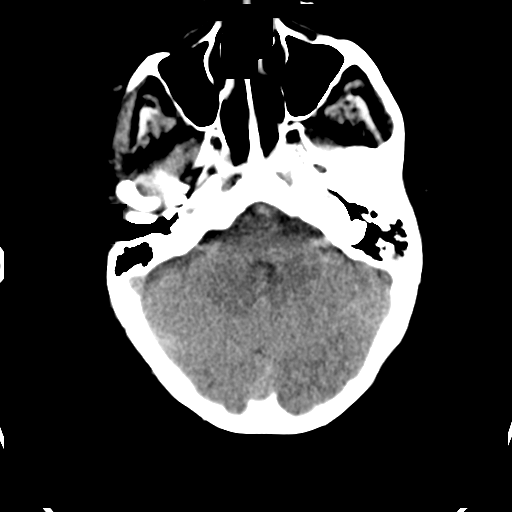
[im 15/34  brain]
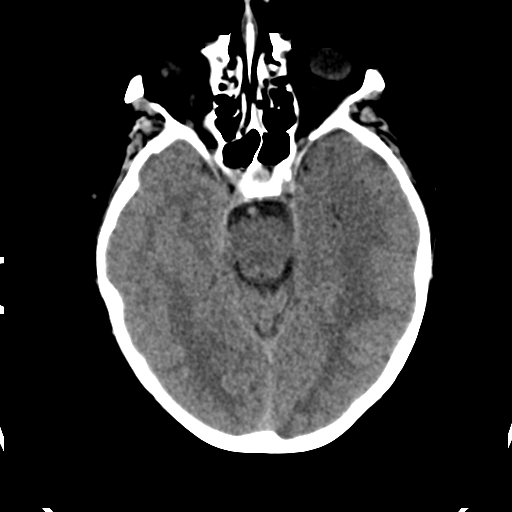
[im 19/34  brain]
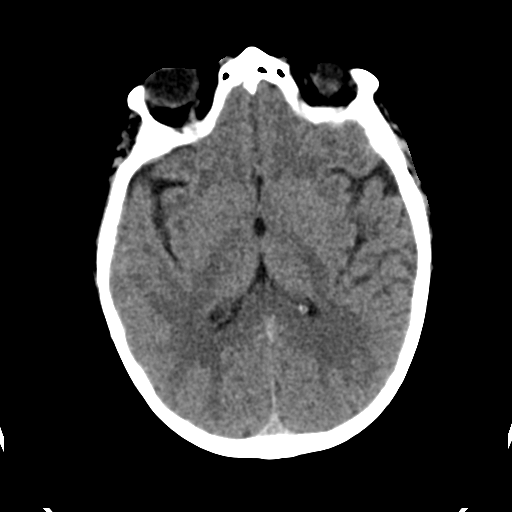
[im 19/34  bone]
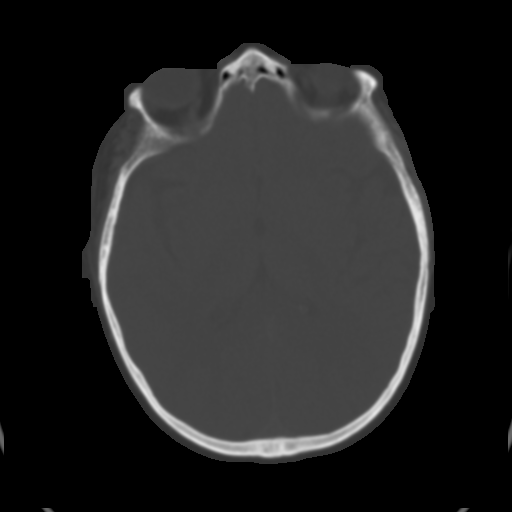
[im 23/34  brain]
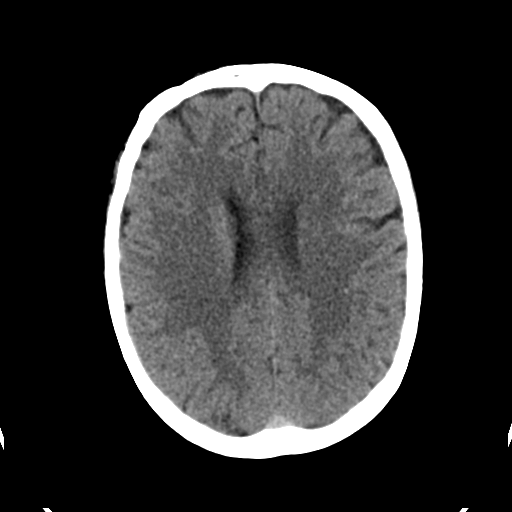
[im 27/34  brain]
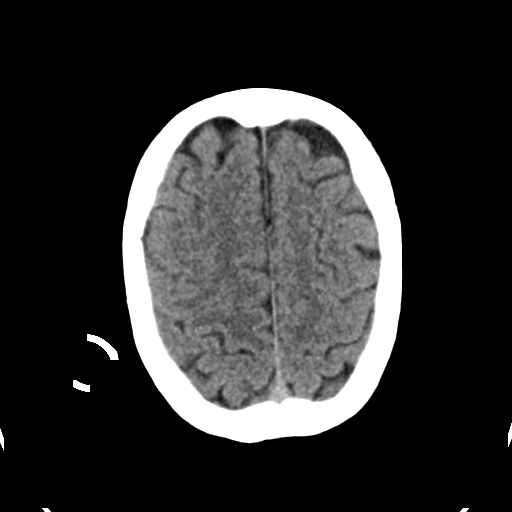
[im 31/34  brain]
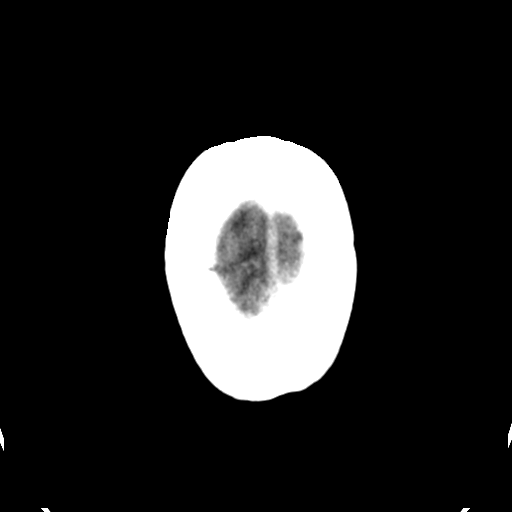

[Series 5: head 3.0 mpr cor · coronal · 0.33mm/px · 3 of 75 slices shown]
[im 25/75  brain]
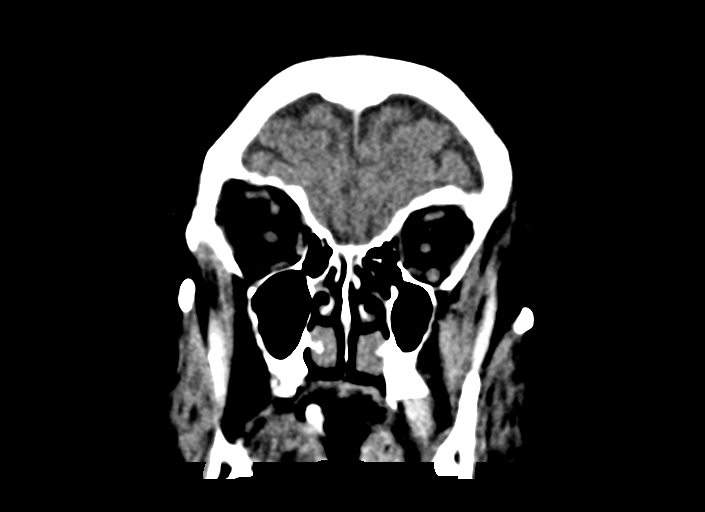
[im 33/75  brain]
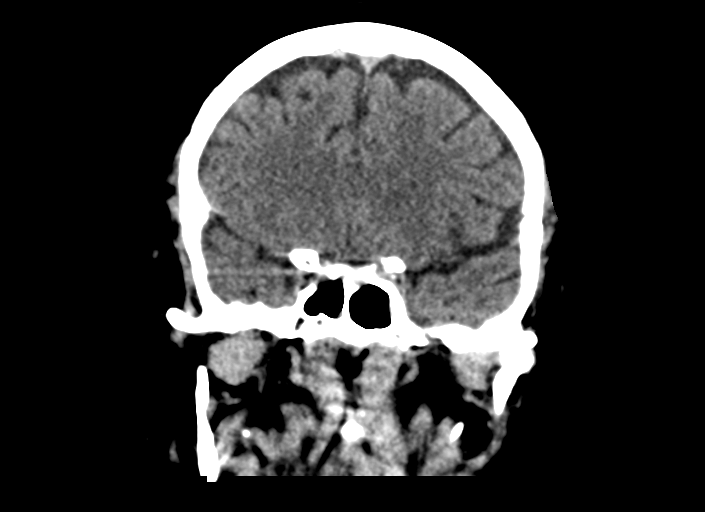
[im 42/75  brain]
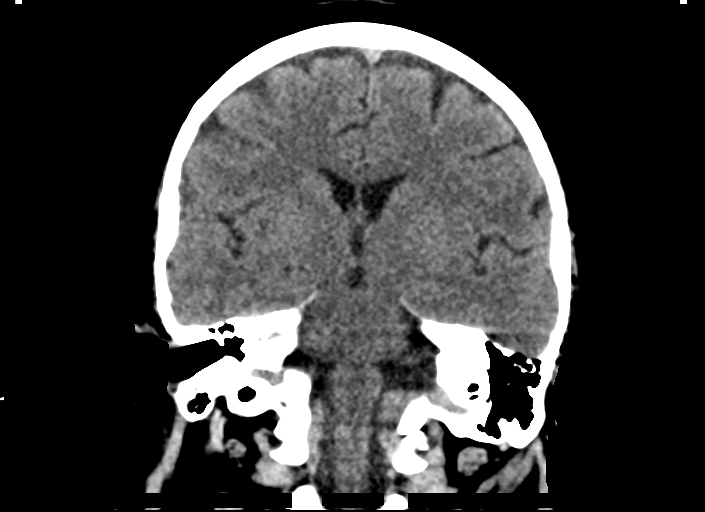

[Series 6: head 3.0 mpr sag · sagittal · 0.33mm/px · 3 of 67 slices shown]
[im 23/67  brain]
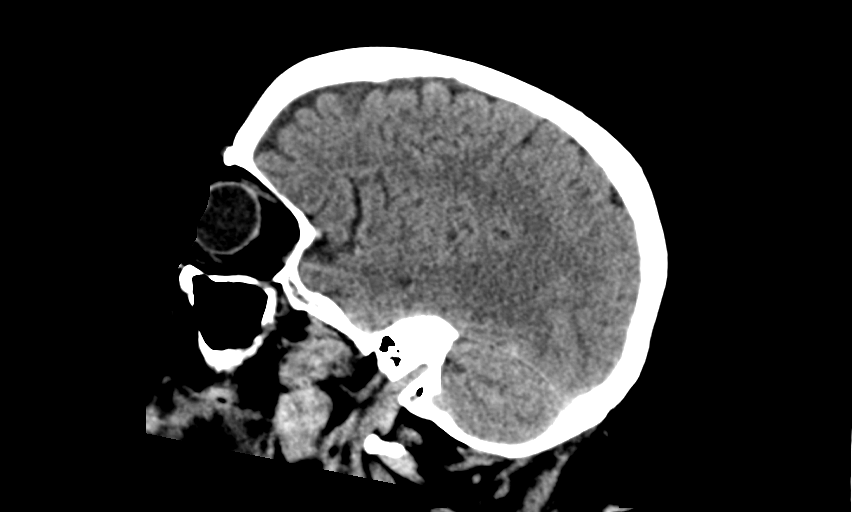
[im 34/67  brain]
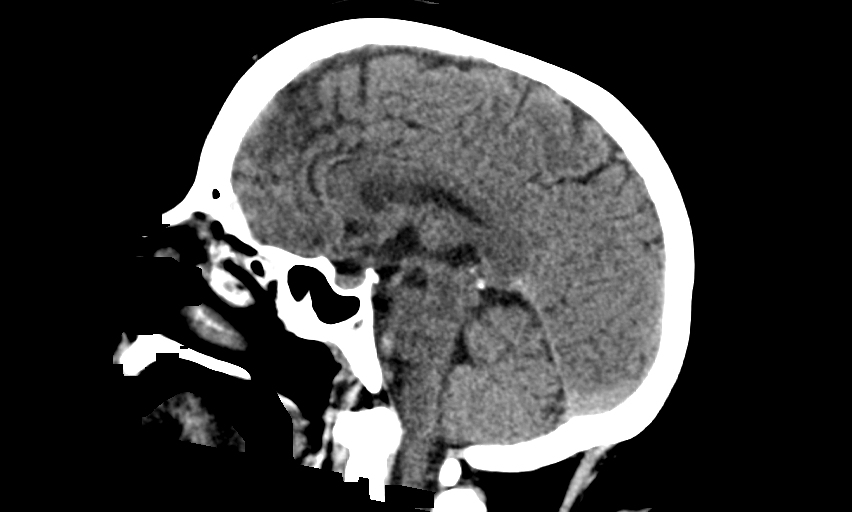
[im 45/67  brain]
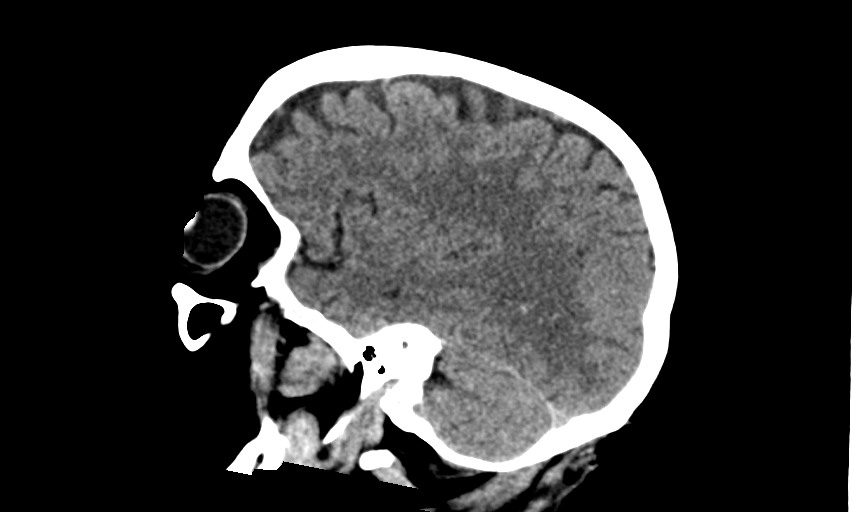

[14 of 47 positions shown; findings below may reference images not displayed]

FINDINGS: Brain: Cerebral volume within normal limits. No acute intracranial
hemorrhage. No acute large vessel territory infarct. No mass lesion,
midline shift or mass effect. No hydrocephalus or extra-axial fluid
collection.

Vascular: No hyperdense vessel.

Skull: Scalp soft tissues and calvarium within normal limits.

Sinuses/Orbits: Globes and orbital soft tissues within normal
limits. Scattered mucosal thickening noted throughout the paranasal
sinuses. No mastoid effusion.

Other: None.

ASPECTS (Alberta Stroke Program Early CT Score)

- Ganglionic level infarction (caudate, lentiform nuclei, internal
capsule, insula, M1-M3 cortex): 7

- Supraganglionic infarction (M4-M6 cortex): 3

Total score (0-10 with 10 being normal): 10
IMPRESSION: 1. No acute intracranial infarct or other abnormality.
2. ASPECTS is 10.

These results were communicated to Dr. Jungsook at [DATE] [place]
07/13/2020by text page via the [employer] messaging system.

## 2021-05-25 DIAGNOSIS — M961 Postlaminectomy syndrome, not elsewhere classified: Secondary | ICD-10-CM | POA: Diagnosis not present

## 2021-05-25 DIAGNOSIS — G894 Chronic pain syndrome: Secondary | ICD-10-CM | POA: Diagnosis not present

## 2021-05-25 DIAGNOSIS — Z79891 Long term (current) use of opiate analgesic: Secondary | ICD-10-CM | POA: Diagnosis not present

## 2021-05-25 DIAGNOSIS — M150 Primary generalized (osteo)arthritis: Secondary | ICD-10-CM | POA: Diagnosis not present

## 2021-07-20 DIAGNOSIS — Z79891 Long term (current) use of opiate analgesic: Secondary | ICD-10-CM | POA: Diagnosis not present

## 2021-07-20 DIAGNOSIS — G894 Chronic pain syndrome: Secondary | ICD-10-CM | POA: Diagnosis not present

## 2021-07-20 DIAGNOSIS — M961 Postlaminectomy syndrome, not elsewhere classified: Secondary | ICD-10-CM | POA: Diagnosis not present

## 2021-07-20 DIAGNOSIS — M150 Primary generalized (osteo)arthritis: Secondary | ICD-10-CM | POA: Diagnosis not present

## 2021-09-28 DIAGNOSIS — E7801 Familial hypercholesterolemia: Secondary | ICD-10-CM | POA: Diagnosis not present

## 2021-09-28 DIAGNOSIS — Z0000 Encounter for general adult medical examination without abnormal findings: Secondary | ICD-10-CM | POA: Diagnosis not present

## 2021-09-28 DIAGNOSIS — E559 Vitamin D deficiency, unspecified: Secondary | ICD-10-CM | POA: Diagnosis not present

## 2021-09-28 DIAGNOSIS — N390 Urinary tract infection, site not specified: Secondary | ICD-10-CM | POA: Diagnosis not present

## 2021-09-28 DIAGNOSIS — E039 Hypothyroidism, unspecified: Secondary | ICD-10-CM | POA: Diagnosis not present

## 2021-09-30 DIAGNOSIS — M961 Postlaminectomy syndrome, not elsewhere classified: Secondary | ICD-10-CM | POA: Diagnosis not present

## 2021-09-30 DIAGNOSIS — M150 Primary generalized (osteo)arthritis: Secondary | ICD-10-CM | POA: Diagnosis not present

## 2021-09-30 DIAGNOSIS — G894 Chronic pain syndrome: Secondary | ICD-10-CM | POA: Diagnosis not present

## 2021-09-30 DIAGNOSIS — Z79891 Long term (current) use of opiate analgesic: Secondary | ICD-10-CM | POA: Diagnosis not present

## 2021-10-01 DIAGNOSIS — D509 Iron deficiency anemia, unspecified: Secondary | ICD-10-CM | POA: Diagnosis not present

## 2021-10-01 DIAGNOSIS — Z0000 Encounter for general adult medical examination without abnormal findings: Secondary | ICD-10-CM | POA: Diagnosis not present

## 2021-10-01 DIAGNOSIS — E1165 Type 2 diabetes mellitus with hyperglycemia: Secondary | ICD-10-CM | POA: Diagnosis not present

## 2021-10-01 DIAGNOSIS — K219 Gastro-esophageal reflux disease without esophagitis: Secondary | ICD-10-CM | POA: Diagnosis not present

## 2021-12-01 DIAGNOSIS — G894 Chronic pain syndrome: Secondary | ICD-10-CM | POA: Diagnosis not present

## 2021-12-01 DIAGNOSIS — M150 Primary generalized (osteo)arthritis: Secondary | ICD-10-CM | POA: Diagnosis not present

## 2021-12-01 DIAGNOSIS — Z79891 Long term (current) use of opiate analgesic: Secondary | ICD-10-CM | POA: Diagnosis not present

## 2021-12-01 DIAGNOSIS — M961 Postlaminectomy syndrome, not elsewhere classified: Secondary | ICD-10-CM | POA: Diagnosis not present

## 2021-12-29 ENCOUNTER — Other Ambulatory Visit: Payer: Self-pay | Admitting: Internal Medicine

## 2021-12-29 DIAGNOSIS — I10 Essential (primary) hypertension: Secondary | ICD-10-CM | POA: Diagnosis not present

## 2021-12-29 DIAGNOSIS — E1165 Type 2 diabetes mellitus with hyperglycemia: Secondary | ICD-10-CM | POA: Diagnosis not present

## 2021-12-29 DIAGNOSIS — E7800 Pure hypercholesterolemia, unspecified: Secondary | ICD-10-CM | POA: Diagnosis not present

## 2021-12-29 DIAGNOSIS — Z789 Other specified health status: Secondary | ICD-10-CM | POA: Diagnosis not present

## 2022-01-06 ENCOUNTER — Other Ambulatory Visit: Payer: Self-pay | Admitting: Internal Medicine

## 2022-01-06 DIAGNOSIS — Z1231 Encounter for screening mammogram for malignant neoplasm of breast: Secondary | ICD-10-CM

## 2022-01-19 DIAGNOSIS — E7800 Pure hypercholesterolemia, unspecified: Secondary | ICD-10-CM | POA: Diagnosis not present

## 2022-02-10 DIAGNOSIS — G894 Chronic pain syndrome: Secondary | ICD-10-CM | POA: Diagnosis not present

## 2022-02-10 DIAGNOSIS — M150 Primary generalized (osteo)arthritis: Secondary | ICD-10-CM | POA: Diagnosis not present

## 2022-02-10 DIAGNOSIS — M961 Postlaminectomy syndrome, not elsewhere classified: Secondary | ICD-10-CM | POA: Diagnosis not present

## 2022-02-10 DIAGNOSIS — Z79891 Long term (current) use of opiate analgesic: Secondary | ICD-10-CM | POA: Diagnosis not present

## 2022-03-22 ENCOUNTER — Ambulatory Visit
Admission: RE | Admit: 2022-03-22 | Discharge: 2022-03-22 | Disposition: A | Discharge status: Home / Self Care | Payer: BC Managed Care – PPO | Source: Ambulatory Visit | Attending: Internal Medicine | Admitting: Internal Medicine

## 2022-03-22 ENCOUNTER — Ambulatory Visit
Admission: RE | Admit: 2022-03-22 | Discharge: 2022-03-22 | Disposition: A | Discharge status: Home / Self Care | Payer: No Typology Code available for payment source | Source: Ambulatory Visit | Attending: Internal Medicine | Admitting: Internal Medicine

## 2022-03-22 ENCOUNTER — Ambulatory Visit: Payer: BC Managed Care – PPO

## 2022-03-22 DIAGNOSIS — Z1231 Encounter for screening mammogram for malignant neoplasm of breast: Secondary | ICD-10-CM | POA: Diagnosis not present

## 2022-03-22 DIAGNOSIS — E7800 Pure hypercholesterolemia, unspecified: Secondary | ICD-10-CM

## 2022-03-24 ENCOUNTER — Other Ambulatory Visit: Payer: Self-pay | Admitting: Internal Medicine

## 2022-03-24 DIAGNOSIS — R928 Other abnormal and inconclusive findings on diagnostic imaging of breast: Secondary | ICD-10-CM

## 2022-04-01 DIAGNOSIS — E7800 Pure hypercholesterolemia, unspecified: Secondary | ICD-10-CM | POA: Diagnosis not present

## 2022-04-01 DIAGNOSIS — Z789 Other specified health status: Secondary | ICD-10-CM | POA: Diagnosis not present

## 2022-04-01 DIAGNOSIS — Z23 Encounter for immunization: Secondary | ICD-10-CM | POA: Diagnosis not present

## 2022-04-01 DIAGNOSIS — E1165 Type 2 diabetes mellitus with hyperglycemia: Secondary | ICD-10-CM | POA: Diagnosis not present

## 2022-04-01 DIAGNOSIS — I10 Essential (primary) hypertension: Secondary | ICD-10-CM | POA: Diagnosis not present

## 2022-04-01 DIAGNOSIS — D509 Iron deficiency anemia, unspecified: Secondary | ICD-10-CM | POA: Diagnosis not present

## 2022-04-06 DIAGNOSIS — M150 Primary generalized (osteo)arthritis: Secondary | ICD-10-CM | POA: Diagnosis not present

## 2022-04-06 DIAGNOSIS — G894 Chronic pain syndrome: Secondary | ICD-10-CM | POA: Diagnosis not present

## 2022-04-06 DIAGNOSIS — Z79891 Long term (current) use of opiate analgesic: Secondary | ICD-10-CM | POA: Diagnosis not present

## 2022-04-06 DIAGNOSIS — M961 Postlaminectomy syndrome, not elsewhere classified: Secondary | ICD-10-CM | POA: Diagnosis not present

## 2022-04-08 DIAGNOSIS — E7800 Pure hypercholesterolemia, unspecified: Secondary | ICD-10-CM | POA: Diagnosis not present

## 2022-04-08 DIAGNOSIS — E039 Hypothyroidism, unspecified: Secondary | ICD-10-CM | POA: Diagnosis not present

## 2022-04-08 DIAGNOSIS — D508 Other iron deficiency anemias: Secondary | ICD-10-CM | POA: Diagnosis not present

## 2022-04-08 DIAGNOSIS — Z789 Other specified health status: Secondary | ICD-10-CM | POA: Diagnosis not present

## 2022-04-22 DIAGNOSIS — I10 Essential (primary) hypertension: Secondary | ICD-10-CM | POA: Diagnosis not present

## 2022-04-22 DIAGNOSIS — R7989 Other specified abnormal findings of blood chemistry: Secondary | ICD-10-CM | POA: Diagnosis not present

## 2022-04-22 DIAGNOSIS — L820 Inflamed seborrheic keratosis: Secondary | ICD-10-CM | POA: Diagnosis not present

## 2022-04-28 ENCOUNTER — Ambulatory Visit
Admission: RE | Admit: 2022-04-28 | Discharge: 2022-04-28 | Disposition: A | Discharge status: Home / Self Care | Payer: BC Managed Care – PPO | Source: Ambulatory Visit | Attending: Internal Medicine | Admitting: Internal Medicine

## 2022-04-28 DIAGNOSIS — R928 Other abnormal and inconclusive findings on diagnostic imaging of breast: Secondary | ICD-10-CM

## 2022-04-28 DIAGNOSIS — R921 Mammographic calcification found on diagnostic imaging of breast: Secondary | ICD-10-CM | POA: Diagnosis not present

## 2022-05-03 ENCOUNTER — Other Ambulatory Visit: Payer: Self-pay | Admitting: Internal Medicine

## 2022-05-03 DIAGNOSIS — R921 Mammographic calcification found on diagnostic imaging of breast: Secondary | ICD-10-CM

## 2022-05-03 DIAGNOSIS — N6320 Unspecified lump in the left breast, unspecified quadrant: Secondary | ICD-10-CM

## 2022-05-05 DIAGNOSIS — Z0000 Encounter for general adult medical examination without abnormal findings: Secondary | ICD-10-CM | POA: Diagnosis not present

## 2022-05-05 DIAGNOSIS — D508 Other iron deficiency anemias: Secondary | ICD-10-CM | POA: Diagnosis not present

## 2022-05-05 DIAGNOSIS — E559 Vitamin D deficiency, unspecified: Secondary | ICD-10-CM | POA: Diagnosis not present

## 2022-05-14 ENCOUNTER — Ambulatory Visit
Admission: RE | Admit: 2022-05-14 | Discharge: 2022-05-14 | Disposition: A | Discharge status: Home / Self Care | Payer: BC Managed Care – PPO | Source: Ambulatory Visit | Attending: Internal Medicine | Admitting: Internal Medicine

## 2022-05-14 DIAGNOSIS — R921 Mammographic calcification found on diagnostic imaging of breast: Secondary | ICD-10-CM

## 2022-05-14 DIAGNOSIS — N6321 Unspecified lump in the left breast, upper outer quadrant: Secondary | ICD-10-CM | POA: Diagnosis not present

## 2022-05-14 DIAGNOSIS — C50912 Malignant neoplasm of unspecified site of left female breast: Secondary | ICD-10-CM | POA: Diagnosis not present

## 2022-05-14 DIAGNOSIS — N6320 Unspecified lump in the left breast, unspecified quadrant: Secondary | ICD-10-CM

## 2022-05-14 HISTORY — PX: BREAST BIOPSY: SHX20

## 2022-05-18 ENCOUNTER — Other Ambulatory Visit: Payer: Self-pay | Admitting: Internal Medicine

## 2022-05-18 DIAGNOSIS — N6489 Other specified disorders of breast: Secondary | ICD-10-CM

## 2022-05-18 DIAGNOSIS — R921 Mammographic calcification found on diagnostic imaging of breast: Secondary | ICD-10-CM

## 2022-05-19 ENCOUNTER — Telehealth: Payer: Self-pay | Admitting: Hematology and Oncology

## 2022-05-19 NOTE — Telephone Encounter (Signed)
Spoke to patient to confirm upcoming afternoon [facility] clinic appointment on 12/13 paperwork will be sent via mail.   Gave location and time, also informed patient that the surgeon's office would be calling as well to get information from them similar to the packet that they will be receiving so make sure to do both.  Reminded patient that all providers will be coming to the clinic to see them HERE and if they had any questions to not hesitate to reach back out to myself or their navigators.

## 2022-05-24 ENCOUNTER — Encounter: Payer: Self-pay | Admitting: *Deleted

## 2022-05-24 DIAGNOSIS — Z171 Estrogen receptor negative status [ER-]: Secondary | ICD-10-CM | POA: Insufficient documentation

## 2022-05-24 DIAGNOSIS — Z170 Estrogen receptor positive status [ER+]: Secondary | ICD-10-CM

## 2022-05-24 DIAGNOSIS — C50812 Malignant neoplasm of overlapping sites of left female breast: Secondary | ICD-10-CM | POA: Insufficient documentation

## 2022-05-25 ENCOUNTER — Other Ambulatory Visit: Payer: Self-pay | Admitting: *Deleted

## 2022-05-25 DIAGNOSIS — Z170 Estrogen receptor positive status [ER+]: Secondary | ICD-10-CM

## 2022-05-25 DIAGNOSIS — Z171 Estrogen receptor negative status [ER-]: Secondary | ICD-10-CM

## 2022-05-25 NOTE — Progress Notes (Signed)
Radiation Oncology         ([phone]) [phone] ________________________________  Initial Outpatient Consultation  Name: [name] MRN: [id number]  Date: 05/26/2022  DOB: [birth year]  IO:MBTDH, Thayer Jew, MD  Jovita Kussmaul, MD   REFERRING PHYSICIAN: Autumn Messing III, MD  DIAGNOSIS: No diagnosis found.   Cancer Staging  No matching staging information was found for the patient.  Stage *** Overlapping sites of the left breast: -- 2 o'clock left breast : Invasive Ductal Carcinoma with PNI and intermediate grade DCIS, ER+ / PR- / Her2-, Grade 1 --Left breast calcifications: Invasive Lobular Carcinoma with associated high-grade LCIS, ER+ / PR- / Her2-, grade 2   Stage *** Right Breast UOQ Invasive Ductal Carcinoma, ER- / PR- / Her2-, Grade 2  CHIEF COMPLAINT: Here to discuss management of bilateral  breast cancers  HISTORY OF PRESENT ILLNESS::[name] is a 61 y.o. female who presented with bilateral breast abnormalities on the following imaging: bilateral screening mammogram on the date of 03/22/22.  No symptoms, if any, were reported at that time. Bilateral diagnostic mammogram and bilateral breast ultrasound on 04/28/22 revealed: a hypoechoic irregular masslike abnormality centered in the upper outer right breast spanning 4 cm in the greatest dimension, with associated echogenic foci consistent with calcifications. This mass corresponds to a palpable right breast lump found on examination at the time of imaging. Physical exam also revealed a small palpable mass in the 2 o'clock right breast, a firm mass in the 10 o'clock right breast, a smaller mass deep to the medial aspect of the superior breast scar, a firm mass in the upper outer left breast, and a smaller mass noted further from the left nipple at the 2 o'clock position.   In the left breast, an ill-defined hypoechoic area in the 12 o'clock left breast was appreciated, measuring 3.6 cm in the greatest extent, containing internal foci  of increased echogenicity consistent with calcifications.  A small hypoechoic irregular masslike lesion in the 2 o'clock left breast was also seen, located approximately 3 cmfn, measuring 1.4 x 0.7 cm radially,  associated with a small punctate foci of increased echogenicity consistent with calcifications. Lastly, an additional 2 o'clock left breast mass was also appreciated, located 7 cmfn, measuring 1.9 cm radially. Ultrasound otherwise showed no abnormal appearing lymph nodes in either axilla.    Biopsy of the 2 o'clock left breast on date of 05/14/22 showed grade 1 invasive ductal carcinoma measuring 5 mm in the greatest linear extent of the sample with PNI and intermediate grade DCIS.  ER status: 70% positive with strong staining intensity; PR status negative; Proliferation marker Ki67 at <1%; Her2 status negative; Grade 1.  Biopsy of the left breast calcifications revealed grade 2 invasive lobular carcinoma measuring 1 mm in the greatest linear extent of the sample, and associated intermediate to high grade LCIS with central necrosis and calcifications. ER status 80% positive with strong staining intensity; PR status negative; Proliferation marker Ki67 at <1%; Her2 status negative; Grade 2.   Right breast biopsy performed revealed grade 2 invasive ductal carcinoma measuring less than 1 mm in the greatest extent of the sample, and associated high-grade DCIS with central necrosis and calcifications. ER status negative; PR status negative; Proliferation marker Ki67 at 0%; Her2 status negative; Grade 2.   (No lymph nodes were examined)  ***  PREVIOUS RADIATION THERAPY: No  PAST MEDICAL HISTORY:  has a past medical history of Arthritis, Bladder spasms, GERD (gastroesophageal reflux disease), Hyperlipidemia, and Hypertension.  PAST SURGICAL HISTORY: Past Surgical History:  Procedure Laterality Date   [place], 2006   BREAST BIOPSY Left 05/14/2022   [place] LT BREAST BX W LOC DEV 1ST  LESION IMG BX SPEC US GUIDE 05/14/2022 [facility]   BREAST BIOPSY Right 05/14/2022   MM RT BREAST BX W LOC DEV 1ST LESION IMAGE BX SPEC STEREO GUIDE 05/14/2022 [facility]   BREAST BIOPSY Left 05/14/2022   MM LT BREAST BX W LOC DEV 1ST LESION IMAGE BX SPEC STEREO GUIDE 05/14/2022 [facility]   BREAST LUMPECTOMY  1976   bi-lat , both benign    CHOLECYSTECTOMY  1998   LEG SURGERY  2009   work related injury   PARTIAL HYSTERECTOMY      FAMILY HISTORY: family history includes Breast cancer (age of onset: 6) in her sister; Diabetes in her father and mother; Heart disease in her father and mother; Hypertension in her father and mother.  SOCIAL HISTORY:  reports that she has been smoking cigarettes. She has a 37.00 pack-year smoking history. She has never used smokeless tobacco. She reports that she does not drink alcohol and does not use drugs.  ALLERGIES: Codeine phosphate, Simvastatin, and Sulfamethoxazole  MEDICATIONS:  Current Outpatient Medications  Medication Sig Dispense Refill   albuterol (VENTOLIN HFA) 108 (90 Base) MCG/ACT inhaler Inhale 2 puffs into the lungs every 6 (six) hours as needed for wheezing or shortness of breath. 6.7 g 0   ALPRAZolam (XANAX) 0.25 MG tablet Take 0.25 mg by mouth 2 (two) times daily as needed for anxiety.  2   amLODipine (NORVASC) 10 MG tablet Take 10 mg by mouth daily.     dexlansoprazole (DEXILANT) 60 MG capsule Take 60 mg by mouth daily.     ezetimibe (ZETIA) 10 MG tablet Take 10 mg by mouth daily.     lidocaine (LIDODERM) 5 % Place 3 patches onto the skin daily. Remove & Discard patch within 12 hours or as directed by MD (Patient taking differently: No sig reported) 90 patch 11   loratadine (CLARITIN) 10 MG tablet Take 10 mg by mouth daily. For allergies     metFORMIN (GLUCOPHAGE-XR) 500 MG 24 hr tablet Take 500 mg by mouth 2 (two) times daily.     oxyCODONE ER (XTAMPZA ER) 36 MG C12A Take 36 mg by mouth in the morning and at  bedtime.     OZEMPIC, 0.25 OR 0.5 MG/DOSE, 2 MG/1.5ML SOPN Inject 2 mg into the skin once a week. Saturday     predniSONE (DELTASONE) 10 MG tablet Take 40 mg daily for 2 days, 30 mg daily for 2 days, 20 mg daily for 2 days,10 mg daily for 1 days, then stop 19 tablet 0   pregabalin (LYRICA) 100 MG capsule Take 100 mg by mouth 2 (two) times daily.     venlafaxine XR (EFFEXOR-XR) 37.5 MG 24 hr capsule Take 37.5 mg by mouth daily.     No current facility-administered medications for this encounter.    REVIEW OF SYSTEMS: As above in HPI.   PHYSICAL EXAM:  vitals were not taken for this visit.   General: Alert and oriented, in no acute distress HEENT: Head is normocephalic. Extraocular movements are intact. Oropharynx is clear. Neck: Neck is supple, no palpable cervical or supraclavicular lymphadenopathy. Heart: Regular in rate and rhythm with no murmurs, rubs, or gallops. Chest: Clear to auscultation bilaterally, with no rhonchi, wheezes, or rales. Abdomen: Soft, nontender, nondistended, with no rigidity or guarding.  Extremities: No cyanosis or edema. Lymphatics: see Neck Exam Skin: No concerning lesions. Musculoskeletal: symmetric strength and muscle tone throughout. Neurologic: Cranial nerves II through XII are grossly intact. No obvious focalities. Speech is fluent. Coordination is intact. Psychiatric: Judgment and insight are intact. Affect is appropriate. Breasts: *** . No other palpable masses appreciated in the breasts or axillae *** .    ECOG = ***  0 - Asymptomatic (Fully active, able to carry on all predisease activities without restriction)  1 - Symptomatic but completely ambulatory (Restricted in physically strenuous activity but ambulatory and able to carry out work of a light or sedentary nature. For example, light housework, office work)  2 - Symptomatic, <50% in bed during the day (Ambulatory and capable of all self care but unable to carry out any work activities. Up and  about more than 50% of waking hours)  3 - Symptomatic, >50% in bed, but not bedbound (Capable of only limited self-care, confined to bed or chair 50% or more of waking hours)  4 - Bedbound (Completely disabled. Cannot carry on any self-care. Totally confined to bed or chair)  5 - Death   Eustace Pen MM, Creech RH, Tormey DC, et al. [phone]). "Toxicity and response criteria of the [facility]". [place] Oncol. 5 (6): 649-55   LABORATORY DATA:  Lab Results  Component Value Date   WBC 5.2 07/16/2020   HGB 13.0 07/16/2020   HCT 40.7 07/16/2020   MCV 88.5 07/16/2020   PLT 228 07/16/2020   CMP     Component Value Date/Time   NA 136 07/16/2020 0143   K 4.8 07/16/2020 0143   CL 100 07/16/2020 0143   CO2 26 07/16/2020 0143   GLUCOSE 189 (H) 07/16/2020 0143   GLUCOSE 109 (H) 06/10/2006 1007   BUN 23 07/16/2020 0143   CREATININE 0.93 07/16/2020 0143   CALCIUM 8.5 (L) 07/16/2020 0143   PROT 5.7 (L) 07/16/2020 0143   ALBUMIN 2.7 (L) 07/16/2020 0143   AST 41 07/16/2020 0143   ALT 59 (H) 07/16/2020 0143   ALKPHOS 45 07/16/2020 0143   BILITOT 0.7 07/16/2020 0143   GFRNONAA >60 07/16/2020 0143   GFRAA  08/11/2010 0505    >60        The eGFR has been calculated using the MDRD equation. This calculation has not been validated in all clinical situations. eGFR's persistently <60 mL/min signify possible Chronic Kidney Disease.         RADIOGRAPHY: MM RT BREAST BX W LOC DEV 1ST LESION IMAGE BX SPEC STEREO GUIDE  Addendum Date: 05/18/2022   ADDENDUM REPORT: 05/18/2022 10:11 ADDENDUM: Pathology revealed 1- INVASIVE CARCINOMA OF NO SPECIAL TYPE (DUCTAL), GRADE 1, WITH PERINEURAL INVASION.- DUCTAL CARCINOMA IN SITU (DCIS), GRADE II (INTERMEDIATE), WITH MICROCALCIFICATIONS of the LEFT breast mass, 2 o'clock axis (ribbon clip). This was found to be concordant by Dr. Franki Cabot. Pathology revealed 2- INVASIVE LOBULAR CARCINOMA, GRADE 2, PLEOMORPHIC LOBULAR CARCINOMA IN  SITU (P-LCIS), GRADE II-III (INTERMEDIATE-HIGH), WITH CENTRAL NECROSIS AND CALCIFICATIONS of the LEFT breast calcifications, upper outer quadrant (coil clip). This was found to be concordant by Dr. Franki Cabot. Pathology revealed 3- INVASIVE CARCINOMA OF NO SPECIAL TYPE (DUCTAL), GRADE 2, FOCUS WITH TUMOR LESS THAN 1 MM. DUCTAL CARCINOMA IN SITU (DCIS), GRADE III (HIGH), WITH CENTRAL NECROSIS AND CALCIFICATIONS of the RIGHT breast (X clip). This was found to be concordant by Dr. Franki Cabot. Pathology results were discussed with the patient by telephone. The patient reported  doing well after the biopsies with more tenderness at left sites. Post biopsy instructions and care were reviewed and questions were answered. The patient was encouraged to call [facility] [employer] for any additional concerns. My direct number was given. Recommendation: stereo guided biopsies of (1) calcifications and distortion in the upper outer RIGHT breast and (2) calcifications in far outer RIGHT breast. Further recommendations will be based on these biopsies. (Request for scheduling priority biopsies sent 12/5 in AM per secure email). The patient was referred to [facility] on May 26, 2022, per patient request. Pathology results reported by Stacie Acres RN on 05/18/2022. Electronically Signed   By: Franki Cabot M.D.   On: 05/18/2022 10:11   Result Date: 05/18/2022 CLINICAL DATA:  Patient presents today for stereotactic biopsy of indeterminate calcifications within the upper-outer quadrant of the LEFT breast. Patient also presents today for stereotactic biopsy of indeterminate calcifications within the upper-outer quadrant of the RIGHT breast at anterior depth. For the purposes of labeling in the FINDINGS section below, an earlier LEFT breast ultrasound-guided biopsy was labeled as site #1. The stereotactic biopsies will be labeled site  #2 (LEFT breast calcifications) and site #3 (RIGHT breast calcifications) respectively. EXAM: LEFT BREAST STEREOTACTIC CORE NEEDLE BIOPSY RIGHT BREAST STEREOTACTIC CORE NEEDLE BIOPSY COMPARISON:  Previous exam(s). FINDINGS: For the purposes of labeling the biopsy sites for all 3 biopsies performed today, an earlier LEFT breast ultrasound-guided biopsy sample container was labeled as #1. The stereotactic biopsy sample containers will be labeled #2 (LEFT breast calcifications) and #3 (RIGHT breast calcifications) respectively. The patient and I discussed the procedure of stereotactic-guided biopsy including benefits and alternatives. We discussed the high likelihood of a successful procedure. We discussed the risks of the procedure including infection, bleeding, tissue injury, clip migration, and inadequate sampling. Informed written consent was given. The usual time out protocol was performed immediately prior to the procedure. Stereotactic site #1 (container labeled #2): Using sterile technique and 1% Lidocaine as local anesthetic, under stereotactic guidance, a 9 gauge vacuum assisted device was used to perform core needle biopsy of calcifications in the upper outer quadrant of the left breast using a superior approach. Specimen radiograph was performed showing calcifications. Specimens with calcifications are identified for pathology. Lesion quadrant: Upper outer quadrant At the conclusion of the procedure, coil shaped tissue marker clip was deployed into the biopsy cavity. Stereotactic site #2 (container labeled #3): Using sterile technique and 1% Lidocaine as local anesthetic, under stereotactic guidance, a 9 gauge vacuum assisted device was used to perform core needle biopsy of calcifications in the upper outer quadrant of the RIGHT breast, at anterior depth, using a superior approach. Specimen radiograph was performed showing calcifications. Specimens with calcifications are identified for pathology. Lesion  quadrant: Upper outer quadrant At the conclusion of the procedure, X shaped tissue marker clip was deployed into the biopsy cavity. Follow-up 2-view mammogram of each breast was performed and dictated separately. IMPRESSION: 1. Stereotactic-guided biopsy of calcifications within the upper-outer quadrant of the LEFT breast. Coil shaped clip placed at the biopsy site. Sample container labeled #2. No apparent complications. 2. Stereotactic-guided biopsy of calcifications within the upper-outer quadrant of the RIGHT breast. X shaped clip placed at the biopsy site. Sample container labeled #3. No apparent complications. 3. An ultrasound-guided biopsy was performed earlier today of a LEFT breast mass at the 2 o'clock axis (container labeled #1). Ribbon shaped clip placed at this biopsy  site. Electronically Signed: By: Franki Cabot M.D. On: 05/14/2022 15:53  MM LT BREAST BX W LOC DEV 1ST LESION IMAGE BX SPEC STEREO GUIDE  Addendum Date: 05/18/2022   ADDENDUM REPORT: 05/18/2022 10:11 ADDENDUM: Pathology revealed 1- INVASIVE CARCINOMA OF NO SPECIAL TYPE (DUCTAL), GRADE 1, WITH PERINEURAL INVASION.- DUCTAL CARCINOMA IN SITU (DCIS), GRADE II (INTERMEDIATE), WITH MICROCALCIFICATIONS of the LEFT breast mass, 2 o'clock axis (ribbon clip). This was found to be concordant by Dr. Franki Cabot. Pathology revealed 2- INVASIVE LOBULAR CARCINOMA, GRADE 2, PLEOMORPHIC LOBULAR CARCINOMA IN SITU (P-LCIS), GRADE II-III (INTERMEDIATE-HIGH), WITH CENTRAL NECROSIS AND CALCIFICATIONS of the LEFT breast calcifications, upper outer quadrant (coil clip). This was found to be concordant by Dr. Franki Cabot. Pathology revealed 3- INVASIVE CARCINOMA OF NO SPECIAL TYPE (DUCTAL), GRADE 2, FOCUS WITH TUMOR LESS THAN 1 MM. DUCTAL CARCINOMA IN SITU (DCIS), GRADE III (HIGH), WITH CENTRAL NECROSIS AND CALCIFICATIONS of the RIGHT breast (X clip). This was found to be concordant by Dr. Franki Cabot. Pathology results were discussed with the patient by  telephone. The patient reported doing well after the biopsies with more tenderness at left sites. Post biopsy instructions and care were reviewed and questions were answered. The patient was encouraged to call [facility] for any additional concerns. My direct number was given. Recommendation: stereo guided biopsies of (1) calcifications and distortion in the upper outer RIGHT breast and (2) calcifications in far outer RIGHT breast. Further recommendations will be based on these biopsies. (Request for scheduling priority biopsies sent 12/5 in AM per secure email). The patient was referred to [facility] on May 26, 2022, per patient request. Pathology results reported by Stacie Acres RN on 05/18/2022. Electronically Signed   By: Franki Cabot M.D.   On: 05/18/2022 10:11   Result Date: 05/18/2022 CLINICAL DATA:  Patient presents today for stereotactic biopsy of indeterminate calcifications within the upper-outer quadrant of the LEFT breast. Patient also presents today for stereotactic biopsy of indeterminate calcifications within the upper-outer quadrant of the RIGHT breast at anterior depth. For the purposes of labeling in the FINDINGS section below, an earlier LEFT breast ultrasound-guided biopsy was labeled as site #1. The stereotactic biopsies will be labeled site #2 (LEFT breast calcifications) and site #3 (RIGHT breast calcifications) respectively. EXAM: LEFT BREAST STEREOTACTIC CORE NEEDLE BIOPSY RIGHT BREAST STEREOTACTIC CORE NEEDLE BIOPSY COMPARISON:  Previous exam(s). FINDINGS: For the purposes of labeling the biopsy sites for all 3 biopsies performed today, an earlier LEFT breast ultrasound-guided biopsy sample container was labeled as #1. The stereotactic biopsy sample containers will be labeled #2 (LEFT breast calcifications) and #3 (RIGHT breast calcifications) respectively. The patient and I  discussed the procedure of stereotactic-guided biopsy including benefits and alternatives. We discussed the high likelihood of a successful procedure. We discussed the risks of the procedure including infection, bleeding, tissue injury, clip migration, and inadequate sampling. Informed written consent was given. The usual time out protocol was performed immediately prior to the procedure. Stereotactic site #1 (container labeled #2): Using sterile technique and 1% Lidocaine as local anesthetic, under stereotactic guidance, a 9 gauge vacuum assisted device was used to perform core needle biopsy of calcifications in the upper outer quadrant of the left breast using a superior approach. Specimen radiograph was performed showing calcifications. Specimens with calcifications are identified for pathology. Lesion quadrant: Upper outer quadrant At the conclusion of the procedure, coil shaped tissue marker clip was deployed  into the biopsy cavity. Stereotactic site #2 (container labeled #3): Using sterile technique and 1% Lidocaine as local anesthetic, under stereotactic guidance, a 9 gauge vacuum assisted device was used to perform core needle biopsy of calcifications in the upper outer quadrant of the RIGHT breast, at anterior depth, using a superior approach. Specimen radiograph was performed showing calcifications. Specimens with calcifications are identified for pathology. Lesion quadrant: Upper outer quadrant At the conclusion of the procedure, X shaped tissue marker clip was deployed into the biopsy cavity. Follow-up 2-view mammogram of each breast was performed and dictated separately. IMPRESSION: 1. Stereotactic-guided biopsy of calcifications within the upper-outer quadrant of the LEFT breast. Coil shaped clip placed at the biopsy site. Sample container labeled #2. No apparent complications. 2. Stereotactic-guided biopsy of calcifications within the upper-outer quadrant of the RIGHT breast. X shaped clip placed at  the biopsy site. Sample container labeled #3. No apparent complications. 3. An ultrasound-guided biopsy was performed earlier today of a LEFT breast mass at the 2 o'clock axis (container labeled #1). Ribbon shaped clip placed at this biopsy site. Electronically Signed: By: Franki Cabot M.D. On: 05/14/2022 15:53  [place] LT BREAST BX W LOC DEV 1ST LESION IMG BX SPEC US GUIDE  Addendum Date: 05/18/2022   ADDENDUM REPORT: 05/18/2022 10:11 ADDENDUM: Pathology revealed 1- INVASIVE CARCINOMA OF NO SPECIAL TYPE (DUCTAL), GRADE 1, WITH PERINEURAL INVASION.- DUCTAL CARCINOMA IN SITU (DCIS), GRADE II (INTERMEDIATE), WITH MICROCALCIFICATIONS of the LEFT breast mass, 2 o'clock axis (ribbon clip). This was found to be concordant by Dr. Franki Cabot. Pathology revealed 2- INVASIVE LOBULAR CARCINOMA, GRADE 2, PLEOMORPHIC LOBULAR CARCINOMA IN SITU (P-LCIS), GRADE II-III (INTERMEDIATE-HIGH), WITH CENTRAL NECROSIS AND CALCIFICATIONS of the LEFT breast calcifications, upper outer quadrant (coil clip). This was found to be concordant by Dr. Franki Cabot. Pathology revealed 3- INVASIVE CARCINOMA OF NO SPECIAL TYPE (DUCTAL), GRADE 2, FOCUS WITH TUMOR LESS THAN 1 MM. DUCTAL CARCINOMA IN SITU (DCIS), GRADE III (HIGH), WITH CENTRAL NECROSIS AND CALCIFICATIONS of the RIGHT breast (X clip). This was found to be concordant by Dr. Franki Cabot. Pathology results were discussed with the patient by telephone. The patient reported doing well after the biopsies with more tenderness at left sites. Post biopsy instructions and care were reviewed and questions were answered. The patient was encouraged to call [facility] for any additional concerns. My direct number was given. Recommendation: stereo guided biopsies of (1) calcifications and distortion in the upper outer RIGHT breast and (2) calcifications in far outer RIGHT breast. Further recommendations will be based on these biopsies. (Request for scheduling priority  biopsies sent 12/5 in AM per secure email). The patient was referred to [facility] on May 26, 2022, per patient request. Pathology results reported by Stacie Acres RN on 05/18/2022. Electronically Signed   By: Franki Cabot M.D.   On: 05/18/2022 10:11   Result Date: 05/18/2022 CLINICAL DATA:  Patient presents for ultrasound-guided biopsy of a LEFT breast mass at the 2 o'clock axis. EXAM: ULTRASOUND GUIDED LEFT BREAST CORE NEEDLE BIOPSY COMPARISON:  Previous exam(s). PROCEDURE: I met with the patient and we discussed the procedure of ultrasound-guided biopsy, including benefits and alternatives. We discussed the high likelihood of a successful procedure. We discussed the risks of the procedure, including infection, bleeding, tissue injury, clip migration, and inadequate sampling. Informed written consent was given. The usual time-out protocol was performed immediately prior to the procedure. Lesion quadrant:  Upper outer quadrant Using sterile technique and 1% Lidocaine as local anesthetic, under direct ultrasound visualization, a 12 gauge spring-loaded device was used to perform biopsy of the LEFT breast mass at the 2 o'clock axis, 7 cm from the nipple, using a lateral approach. At the conclusion of the procedure ribbon shaped tissue marker clip was deployed into the biopsy cavity. Follow up 2 view mammogram was performed and dictated separately. IMPRESSION: Ultrasound guided biopsy of the LEFT breast mass at the 2 o'clock axis. No apparent complications. Electronically Signed: By: Franki Cabot M.D. On: 05/14/2022 14:54  MM CLIP PLACEMENT LEFT  Result Date: 05/14/2022 CLINICAL DATA:  Status post ultrasound-guided biopsy of a LEFT breast mass at the 2 o'clock axis. Patient is also status post stereotactic guided biopsy of LEFT breast calcifications and RIGHT breast calcifications. EXAM: 3D DIAGNOSTIC BILATERAL MAMMOGRAM POST  ULTRASOUND BIOPSY (single site) STEREOTACTIC BIOPSIES (2 sites) COMPARISON:  Previous exam(s). FINDINGS: 3D Mammographic images were obtained following ultrasound guided biopsy of a LEFT breast mass at the 2 o'clock axis, stereotactic guided biopsy of LEFT breast calcifications and stereotactic guided biopsy of RIGHT breast calcifications. The biopsy marking clips are in expected position at the sites of biopsy. IMPRESSION: 1. Appropriate positioning of the ribbon shaped biopsy marking clip at the site of biopsy in the upper-outer quadrant of the LEFT breast corresponding to the targeted mass at the 2 o'clock axis. 2. Appropriate positioning of the coil shaped biopsy marking clip at the site of biopsy in the upper-outer quadrant of the LEFT breast corresponding to the targeted calcifications. 3. Appropriate positioning of the X shaped biopsy marking clip at the site of biopsy in the upper-outer quadrant of the RIGHT breast corresponding to the targeted calcifications. Final Assessment: Post Procedure Mammograms for Marker Placement Electronically Signed   By: Franki Cabot M.D.   On: 05/14/2022 16:03  MM CLIP PLACEMENT RIGHT  Result Date: 05/14/2022 CLINICAL DATA:  Status post ultrasound-guided biopsy of a LEFT breast mass at the 2 o'clock axis. Patient is also status post stereotactic guided biopsy of LEFT breast calcifications and RIGHT breast calcifications. EXAM: 3D DIAGNOSTIC BILATERAL MAMMOGRAM POST ULTRASOUND BIOPSY (single site) STEREOTACTIC BIOPSIES (2 sites) COMPARISON:  Previous exam(s). FINDINGS: 3D Mammographic images were obtained following ultrasound guided biopsy of a LEFT breast mass at the 2 o'clock axis, stereotactic guided biopsy of LEFT breast calcifications and stereotactic guided biopsy of RIGHT breast calcifications. The biopsy marking clips are in expected position at the sites of biopsy. IMPRESSION: 1. Appropriate positioning of the ribbon shaped biopsy marking clip at the site of  biopsy in the upper-outer quadrant of the LEFT breast corresponding to the targeted mass at the 2 o'clock axis. 2. Appropriate positioning of the coil shaped biopsy marking clip at the site of biopsy in the upper-outer quadrant of the LEFT breast corresponding to the targeted calcifications. 3. Appropriate positioning of the X shaped biopsy marking clip at the site of biopsy in the upper-outer quadrant of the RIGHT breast corresponding to the targeted calcifications. Final Assessment: Post Procedure Mammograms for Marker Placement Electronically Signed   By: Franki Cabot M.D.   On: 05/14/2022 16:03  MM DIAG BREAST TOMO BILATERAL  Result Date: 04/28/2022 CLINICAL DATA:  Screening recall for a possible distortions in each breast and bilateral breast calcifications. Patient has remote history breast excisions, but no history of breast carcinoma. Her sister was diagnosed with breast carcinoma at age 62. EXAM: DIGITAL DIAGNOSTIC BILATERAL MAMMOGRAM WITH TOMOSYNTHESIS; ULTRASOUND RIGHT BREAST LIMITED;  ULTRASOUND LEFT BREAST LIMITED TECHNIQUE: Bilateral digital diagnostic mammography and breast tomosynthesis was performed.; Targeted ultrasound examination of the right breast was performed; Targeted ultrasound examination of the left breast was performed. COMPARISON:  Current screening exam. Prior exam from 06/13/2012. No exams between the 2013 and the current screening exam. ACR Breast Density Category d: The breast tissue is extremely dense, which lowers the sensitivity of mammography. FINDINGS: Right breast: On the diagnostic magnification images, the calcifications appear pleomorphic, with associated focal density, an architectural irregularity, centered in the anterior upper outer quadrant. Calcifications span an area of approximately 3.0 x 2.1 x 2.7 cm. There are additional loosely grouped calcifications that extend throughout the upper outer quadrant. No defined mass mammographically. The distortion noted in  the upper outer quadrant corresponds to the overlying scar and is presumed to be postsurgical although is not well-defined on the 2013 mammogram. On the left calcifications appear coarse and heterogeneous, lying in the upper outer quadrant, anterior to middle depth, spanning approximately 1.6 x 2.1 x 1.8 cm. There is associated density and architectural irregularity. In the upper outer quadrant, the possible distortion persists, associated with a few punctate calcifications. Additional punctate calcifications are seen throughout the upper outer quadrant breast tissue. On physical exam, on the right there is a firm mass at approximately 10 o'clock, but extending for several cm medially and laterally. There is an additional smaller firm palpable mass deep to the medial aspect of the superior breast scar. On the left there is a firm mass in the upper outer breast near 12:00 to 1:00, with a smaller for mass noted further from the nipple, at approximately 2 o'clock. Targeted right breast ultrasound is performed, showing a hypoechoic irregular masslike abnormality centered in the upper outer quadrant, anterior breast, with associated echogenic foci consistent with calcifications. This corresponds to the palpable lump and to the mammographic calcifications. It spans 4 cm in greatest dimension. In the upper breast, corresponding to the architectural distortion, there is some subtle irregularity, but no defined mass. This corresponds to tissue underlying the skin scar. Targeted left breast ultrasound is performed, showing an ill-defined hypoechoic area 12 o'clock containing internal foci of increased echogenicity consistent with calcifications. This measures approximately 3.6 x 2.1 x 2.7 cm and corresponds to the area of coarse heterogeneous calcifications mammographically. There is a small hypoechoic irregular masslike lesion at 2 o'clock, approximately 3 cm the nipple, measuring 1.4 x 0.7 cm radially, associated with small  punctate foci of increased echogenicity consistent with calcifications. Further laterally, at 2 o'clock, 7 cm from the nipple, there is a hypoechoic irregular mass with subtle sonographic architectural distortion measuring 1.9 x 1.5 cm anti radially. This corresponds to the area of distortion seen mammographically. Sonographic evaluation of both axilla demonstrate normal lymph nodes. There are no enlarged or abnormal lymph nodes. IMPRESSION: 1. Multiple suspicious findings that appear to be a change from the screening mammogram performed nearly 10 years previously. 2. There are bilateral suspicious breast calcifications in the upper outer quadrants the, visualized post sonographically mammographically. 3. There is a hypoechoic irregular 1.9 cm mass at 2 o'clock, 7 cm the nipple, in the left breast, corresponding to mammographic architectural distortion. A smaller more subtle lesion is seen between this and the calcifications in the left breast on sonography. 4. Prominent architectural distortion in the upper right breast corresponds to a surgical scar. RECOMMENDATION: 1. Stereotactic core needle biopsy of the right and left breast calcifications. 2. Ultrasound-guided core needle biopsy of the 1.9 cm hypoechoic  lesion at 2 o'clock, 7 cm the nipple. 3. Depending on biopsy results, additional biopsies may be warranted. If all biopsies are benign, recommend short-term follow-up bilateral mammography and possible ultrasound to assess for stability. I have discussed the findings and recommendations with the patient. If applicable, a reminder letter will be sent to the patient regarding the next appointment. BI-RADS CATEGORY  4: Suspicious. Electronically Signed   By: Lajean Manes M.D.   On: 04/28/2022 11:16  US BREAST LTD UNI LEFT INC AXILLA  Result Date: 04/28/2022 CLINICAL DATA:  Screening recall for a possible distortions in each breast and bilateral breast calcifications. Patient has remote history breast  excisions, but no history of breast carcinoma. Her sister was diagnosed with breast carcinoma at age 32. EXAM: DIGITAL DIAGNOSTIC BILATERAL MAMMOGRAM WITH TOMOSYNTHESIS; ULTRASOUND RIGHT BREAST LIMITED; ULTRASOUND LEFT BREAST LIMITED TECHNIQUE: Bilateral digital diagnostic mammography and breast tomosynthesis was performed.; Targeted ultrasound examination of the right breast was performed; Targeted ultrasound examination of the left breast was performed. COMPARISON:  Current screening exam. Prior exam from 06/13/2012. No exams between the 2013 and the current screening exam. ACR Breast Density Category d: The breast tissue is extremely dense, which lowers the sensitivity of mammography. FINDINGS: Right breast: On the diagnostic magnification images, the calcifications appear pleomorphic, with associated focal density, an architectural irregularity, centered in the anterior upper outer quadrant. Calcifications span an area of approximately 3.0 x 2.1 x 2.7 cm. There are additional loosely grouped calcifications that extend throughout the upper outer quadrant. No defined mass mammographically. The distortion noted in the upper outer quadrant corresponds to the overlying scar and is presumed to be postsurgical although is not well-defined on the 2013 mammogram. On the left calcifications appear coarse and heterogeneous, lying in the upper outer quadrant, anterior to middle depth, spanning approximately 1.6 x 2.1 x 1.8 cm. There is associated density and architectural irregularity. In the upper outer quadrant, the possible distortion persists, associated with a few punctate calcifications. Additional punctate calcifications are seen throughout the upper outer quadrant breast tissue. On physical exam, on the right there is a firm mass at approximately 10 o'clock, but extending for several cm medially and laterally. There is an additional smaller firm palpable mass deep to the medial aspect of the superior breast scar. On  the left there is a firm mass in the upper outer breast near 12:00 to 1:00, with a smaller for mass noted further from the nipple, at approximately 2 o'clock. Targeted right breast ultrasound is performed, showing a hypoechoic irregular masslike abnormality centered in the upper outer quadrant, anterior breast, with associated echogenic foci consistent with calcifications. This corresponds to the palpable lump and to the mammographic calcifications. It spans 4 cm in greatest dimension. In the upper breast, corresponding to the architectural distortion, there is some subtle irregularity, but no defined mass. This corresponds to tissue underlying the skin scar. Targeted left breast ultrasound is performed, showing an ill-defined hypoechoic area 12 o'clock containing internal foci of increased echogenicity consistent with calcifications. This measures approximately 3.6 x 2.1 x 2.7 cm and corresponds to the area of coarse heterogeneous calcifications mammographically. There is a small hypoechoic irregular masslike lesion at 2 o'clock, approximately 3 cm the nipple, measuring 1.4 x 0.7 cm radially, associated with small punctate foci of increased echogenicity consistent with calcifications. Further laterally, at 2 o'clock, 7 cm from the nipple, there is a hypoechoic irregular mass with subtle sonographic architectural distortion measuring 1.9 x 1.5 cm anti radially. This corresponds to  the area of distortion seen mammographically. Sonographic evaluation of both axilla demonstrate normal lymph nodes. There are no enlarged or abnormal lymph nodes. IMPRESSION: 1. Multiple suspicious findings that appear to be a change from the screening mammogram performed nearly 10 years previously. 2. There are bilateral suspicious breast calcifications in the upper outer quadrants the, visualized post sonographically mammographically. 3. There is a hypoechoic irregular 1.9 cm mass at 2 o'clock, 7 cm the nipple, in the left breast,  corresponding to mammographic architectural distortion. A smaller more subtle lesion is seen between this and the calcifications in the left breast on sonography. 4. Prominent architectural distortion in the upper right breast corresponds to a surgical scar. RECOMMENDATION: 1. Stereotactic core needle biopsy of the right and left breast calcifications. 2. Ultrasound-guided core needle biopsy of the 1.9 cm hypoechoic lesion at 2 o'clock, 7 cm the nipple. 3. Depending on biopsy results, additional biopsies may be warranted. If all biopsies are benign, recommend short-term follow-up bilateral mammography and possible ultrasound to assess for stability. I have discussed the findings and recommendations with the patient. If applicable, a reminder letter will be sent to the patient regarding the next appointment. BI-RADS CATEGORY  4: Suspicious. Electronically Signed   By: Lajean Manes M.D.   On: 04/28/2022 11:16  US BREAST LTD UNI RIGHT INC AXILLA  Result Date: 04/28/2022 CLINICAL DATA:  Screening recall for a possible distortions in each breast and bilateral breast calcifications. Patient has remote history breast excisions, but no history of breast carcinoma. Her sister was diagnosed with breast carcinoma at age 82. EXAM: DIGITAL DIAGNOSTIC BILATERAL MAMMOGRAM WITH TOMOSYNTHESIS; ULTRASOUND RIGHT BREAST LIMITED; ULTRASOUND LEFT BREAST LIMITED TECHNIQUE: Bilateral digital diagnostic mammography and breast tomosynthesis was performed.; Targeted ultrasound examination of the right breast was performed; Targeted ultrasound examination of the left breast was performed. COMPARISON:  Current screening exam. Prior exam from 06/13/2012. No exams between the 2013 and the current screening exam. ACR Breast Density Category d: The breast tissue is extremely dense, which lowers the sensitivity of mammography. FINDINGS: Right breast: On the diagnostic magnification images, the calcifications appear pleomorphic, with associated  focal density, an architectural irregularity, centered in the anterior upper outer quadrant. Calcifications span an area of approximately 3.0 x 2.1 x 2.7 cm. There are additional loosely grouped calcifications that extend throughout the upper outer quadrant. No defined mass mammographically. The distortion noted in the upper outer quadrant corresponds to the overlying scar and is presumed to be postsurgical although is not well-defined on the 2013 mammogram. On the left calcifications appear coarse and heterogeneous, lying in the upper outer quadrant, anterior to middle depth, spanning approximately 1.6 x 2.1 x 1.8 cm. There is associated density and architectural irregularity. In the upper outer quadrant, the possible distortion persists, associated with a few punctate calcifications. Additional punctate calcifications are seen throughout the upper outer quadrant breast tissue. On physical exam, on the right there is a firm mass at approximately 10 o'clock, but extending for several cm medially and laterally. There is an additional smaller firm palpable mass deep to the medial aspect of the superior breast scar. On the left there is a firm mass in the upper outer breast near 12:00 to 1:00, with a smaller for mass noted further from the nipple, at approximately 2 o'clock. Targeted right breast ultrasound is performed, showing a hypoechoic irregular masslike abnormality centered in the upper outer quadrant, anterior breast, with associated echogenic foci consistent with calcifications. This corresponds to the palpable lump and to the  mammographic calcifications. It spans 4 cm in greatest dimension. In the upper breast, corresponding to the architectural distortion, there is some subtle irregularity, but no defined mass. This corresponds to tissue underlying the skin scar. Targeted left breast ultrasound is performed, showing an ill-defined hypoechoic area 12 o'clock containing internal foci of increased echogenicity  consistent with calcifications. This measures approximately 3.6 x 2.1 x 2.7 cm and corresponds to the area of coarse heterogeneous calcifications mammographically. There is a small hypoechoic irregular masslike lesion at 2 o'clock, approximately 3 cm the nipple, measuring 1.4 x 0.7 cm radially, associated with small punctate foci of increased echogenicity consistent with calcifications. Further laterally, at 2 o'clock, 7 cm from the nipple, there is a hypoechoic irregular mass with subtle sonographic architectural distortion measuring 1.9 x 1.5 cm anti radially. This corresponds to the area of distortion seen mammographically. Sonographic evaluation of both axilla demonstrate normal lymph nodes. There are no enlarged or abnormal lymph nodes. IMPRESSION: 1. Multiple suspicious findings that appear to be a change from the screening mammogram performed nearly 10 years previously. 2. There are bilateral suspicious breast calcifications in the upper outer quadrants the, visualized post sonographically mammographically. 3. There is a hypoechoic irregular 1.9 cm mass at 2 o'clock, 7 cm the nipple, in the left breast, corresponding to mammographic architectural distortion. A smaller more subtle lesion is seen between this and the calcifications in the left breast on sonography. 4. Prominent architectural distortion in the upper right breast corresponds to a surgical scar. RECOMMENDATION: 1. Stereotactic core needle biopsy of the right and left breast calcifications. 2. Ultrasound-guided core needle biopsy of the 1.9 cm hypoechoic lesion at 2 o'clock, 7 cm the nipple. 3. Depending on biopsy results, additional biopsies may be warranted. If all biopsies are benign, recommend short-term follow-up bilateral mammography and possible ultrasound to assess for stability. I have discussed the findings and recommendations with the patient. If applicable, a reminder letter will be sent to the patient regarding the next appointment.  BI-RADS CATEGORY  4: Suspicious. Electronically Signed   By: Lajean Manes M.D.   On: 04/28/2022 11:16     IMPRESSION/PLAN: ***   It was a pleasure meeting the patient today. We discussed the risks, benefits, and side effects of radiotherapy. I recommend radiotherapy to the *** to reduce her risk of locoregional recurrence by 2/3.  We discussed that radiation would take approximately *** weeks to complete and that I would give the patient a few weeks to heal following surgery before starting treatment planning. *** If chemotherapy were to be given, this would precede radiotherapy. We spoke about acute effects including skin irritation and fatigue as well as much less common late effects including internal organ injury or irritation. We spoke about the latest technology that is used to minimize the risk of late effects for patients undergoing radiotherapy to the breast or chest wall. No guarantees of treatment were given. The patient is enthusiastic about proceeding with treatment. I look forward to participating in the patient's care.  I will await her referral back to me for postoperative follow-up and eventual CT simulation/treatment planning.  On date of service, in total, I spent *** minutes on this encounter. Patient was seen in person.   __________________________________________   Eppie Gibson, MD  This document serves as a record of services personally performed by Eppie Gibson, MD. It was created on her behalf by Roney Mans, a trained medical scribe. The creation of this record is based on the scribe's personal observations  and the provider's statements to them. This document has been checked and approved by the attending provider.

## 2022-05-26 ENCOUNTER — Ambulatory Visit
Admission: RE | Admit: 2022-05-26 | Discharge: 2022-05-26 | Disposition: A | Discharge status: Home / Self Care | Payer: BC Managed Care – PPO | Source: Ambulatory Visit | Attending: Internal Medicine | Admitting: Internal Medicine

## 2022-05-26 ENCOUNTER — Inpatient Hospital Stay: Payer: BC Managed Care – PPO | Attending: Hematology and Oncology

## 2022-05-26 ENCOUNTER — Inpatient Hospital Stay (HOSPITAL_BASED_OUTPATIENT_CLINIC_OR_DEPARTMENT_OTHER): Payer: BC Managed Care – PPO | Admitting: Hematology and Oncology

## 2022-05-26 ENCOUNTER — Ambulatory Visit
Admission: RE | Admit: 2022-05-26 | Discharge: 2022-05-26 | Disposition: A | Discharge status: Home / Self Care | Payer: BC Managed Care – PPO | Source: Ambulatory Visit | Attending: Radiation Oncology | Admitting: Radiation Oncology

## 2022-05-26 ENCOUNTER — Inpatient Hospital Stay: Payer: BC Managed Care – PPO | Admitting: Genetic Counselor

## 2022-05-26 ENCOUNTER — Encounter: Payer: Self-pay | Admitting: Radiation Oncology

## 2022-05-26 ENCOUNTER — Other Ambulatory Visit: Payer: Self-pay

## 2022-05-26 ENCOUNTER — Ambulatory Visit: Payer: BC Managed Care – PPO | Attending: General Surgery | Admitting: Physical Therapy

## 2022-05-26 ENCOUNTER — Encounter: Payer: Self-pay | Admitting: Physical Therapy

## 2022-05-26 ENCOUNTER — Ambulatory Visit: Payer: Self-pay | Admitting: General Surgery

## 2022-05-26 VITALS — BP 177/93 | HR 91 | Temp 97.7°F | Resp 18 | Ht 65.0 in | Wt 221.8 lb

## 2022-05-26 DIAGNOSIS — R921 Mammographic calcification found on diagnostic imaging of breast: Secondary | ICD-10-CM

## 2022-05-26 DIAGNOSIS — C50411 Malignant neoplasm of upper-outer quadrant of right female breast: Secondary | ICD-10-CM

## 2022-05-26 DIAGNOSIS — Z171 Estrogen receptor negative status [ER-]: Secondary | ICD-10-CM

## 2022-05-26 DIAGNOSIS — Z79899 Other long term (current) drug therapy: Secondary | ICD-10-CM | POA: Diagnosis not present

## 2022-05-26 DIAGNOSIS — R293 Abnormal posture: Secondary | ICD-10-CM | POA: Insufficient documentation

## 2022-05-26 DIAGNOSIS — Z170 Estrogen receptor positive status [ER+]: Secondary | ICD-10-CM | POA: Diagnosis not present

## 2022-05-26 DIAGNOSIS — C50811 Malignant neoplasm of overlapping sites of right female breast: Secondary | ICD-10-CM | POA: Insufficient documentation

## 2022-05-26 DIAGNOSIS — Z803 Family history of malignant neoplasm of breast: Secondary | ICD-10-CM | POA: Insufficient documentation

## 2022-05-26 DIAGNOSIS — C50812 Malignant neoplasm of overlapping sites of left female breast: Secondary | ICD-10-CM

## 2022-05-26 DIAGNOSIS — N6489 Other specified disorders of breast: Secondary | ICD-10-CM

## 2022-05-26 DIAGNOSIS — C50911 Malignant neoplasm of unspecified site of right female breast: Secondary | ICD-10-CM | POA: Diagnosis not present

## 2022-05-26 DIAGNOSIS — R928 Other abnormal and inconclusive findings on diagnostic imaging of breast: Secondary | ICD-10-CM | POA: Diagnosis not present

## 2022-05-26 HISTORY — PX: BREAST BIOPSY: SHX20

## 2022-05-26 LAB — CBC WITH DIFFERENTIAL (CANCER CENTER ONLY)
Abs Immature Granulocytes: 0.03 10*3/uL (ref 0.00–0.07)
Basophils Absolute: 0 10*3/uL (ref 0.0–0.1)
Basophils Relative: 0 %
Eosinophils Absolute: 0.1 10*3/uL (ref 0.0–0.5)
Eosinophils Relative: 2 %
HCT: 39.6 % (ref 36.0–46.0)
Hemoglobin: 12.9 g/dL (ref 12.0–15.0)
Immature Granulocytes: 0 %
Lymphocytes Relative: 21 %
Lymphs Abs: 1.4 10*3/uL (ref 0.7–4.0)
MCH: 30.2 pg (ref 26.0–34.0)
MCHC: 32.6 g/dL (ref 30.0–36.0)
MCV: 92.7 fL (ref 80.0–100.0)
Monocytes Absolute: 0.5 10*3/uL (ref 0.1–1.0)
Monocytes Relative: 7 %
Neutro Abs: 4.7 10*3/uL (ref 1.7–7.7)
Neutrophils Relative %: 70 %
Platelet Count: 244 10*3/uL (ref 150–400)
RBC: 4.27 MIL/uL (ref 3.87–5.11)
RDW: 16.4 % — ABNORMAL HIGH (ref 11.5–15.5)
WBC Count: 6.7 10*3/uL (ref 4.0–10.5)
nRBC: 0 % (ref 0.0–0.2)

## 2022-05-26 LAB — CMP (CANCER CENTER ONLY)
ALT: 30 U/L (ref 0–44)
AST: 21 U/L (ref 15–41)
Albumin: 4.3 g/dL (ref 3.5–5.0)
Alkaline Phosphatase: 63 U/L (ref 38–126)
Anion gap: 10 (ref 5–15)
BUN: 16 mg/dL (ref 8–23)
CO2: 28 mmol/L (ref 22–32)
Calcium: 9.7 mg/dL (ref 8.9–10.3)
Chloride: 103 mmol/L (ref 98–111)
Creatinine: 1.17 mg/dL — ABNORMAL HIGH (ref 0.44–1.00)
GFR, Estimated: 52 mL/min — ABNORMAL LOW (ref >60)
Glucose, Bld: 145 mg/dL — ABNORMAL HIGH (ref 70–99)
Potassium: 4 mmol/L (ref 3.5–5.1)
Sodium: 141 mmol/L (ref 135–145)
Total Bilirubin: 0.2 mg/dL — ABNORMAL LOW (ref 0.3–1.2)
Total Protein: 6.8 g/dL (ref 6.5–8.1)

## 2022-05-26 LAB — GENETIC SCREENING ORDER

## 2022-05-26 MED ORDER — KETOROLAC TROMETHAMINE 15 MG/ML IJ SOLN: 15.0000 mg | Freq: Once | INTRAMUSCULAR | Status: AC

## 2022-05-26 NOTE — Progress Notes (Signed)
[facility] Psychosocial Distress Screening Spiritual Care  Met with [name], her two daughters, and her husband in [facility] to introduce [facility] team/resources, reviewing distress screen per protocol.  The patient scored a 1 on the Psychosocial Distress Thermometer which indicates mild distress. Also assessed for distress and other psychosocial needs.      05/26/2022    4:24 PM  ONCBCN DISTRESS SCREENING  Distress experienced in past week (1-10) 1  Referral to support programs Yes   Lysle Morales, counseling intern, met with the patient, her two daughters, and her husband in clinic today. The patient reported they cried all last week but they have come to accept their diagnosis. The patient reported they believe that all you can do is move forward.   The patient shared they like to spend their time watching TV, like the ID channel.   The patient reported they have a good support system with family that lives nearby.   The patient has the counselor's contact information in case needs arise.   Follow up needed: No.  Lysle Morales,  Counseling Intern  [phone] [email or web address]

## 2022-05-26 NOTE — Therapy (Signed)
OUTPATIENT PHYSICAL THERAPY BREAST CANCER BASELINE EVALUATION   Patient Name: [name] MRN: [id number] DOB:January 03, 1961, 61 y.o., female Today's Date: 05/26/2022  END OF SESSION:  PT End of Session - 05/26/22 1635     Visit Number 1    Number of Visits 2    Date for PT Re-Evaluation 07/21/22    PT Start Time 3729    PT Stop Time 1526    PT Time Calculation (min) 30 min    Activity Tolerance Patient tolerated treatment well    Behavior During Therapy WFL for tasks assessed/performed             Past Medical History:  Diagnosis Date   Arthritis    Bladder spasms    Breast cancer ([place])    GERD (gastroesophageal reflux disease)    Hyperlipidemia    Hypertension    Past Surgical History:  Procedure Laterality Date   [place], 2006   BREAST BIOPSY Left 05/14/2022   [place] LT BREAST BX W LOC DEV 1ST LESION IMG BX [place] GUIDE 05/14/2022 [facility]   BREAST BIOPSY Right 05/14/2022   MM RT BREAST BX W LOC DEV 1ST LESION IMAGE BX SPEC [place]ERE[place] GUIDE 05/14/2022 [facility]   BREAST BIOPSY Left 05/14/2022   MM LT BREAST BX W LOC DEV 1ST LESION IMAGE BX SPEC STEREO GUIDE 05/14/2022 [facility]   BREAST BIOPSY Right 05/26/2022   MM RT BREAST BX W LOC DEV 1ST LESION IMAGE BX SPEC STEREO GUIDE 05/26/2022 [facility]   BREAST BIOPSY Right 05/26/2022   MM RT BREAST BX W LOC DEV EA AD LESION IMG BX SPEC STEREO GUIDE 05/26/2022 [facility]   BREAST LUMPECTOMY  1976   bi-lat , both benign    CHOLECYSTECTOMY  1998   LEG SURGERY  2009   work related injury   PARTIAL HYSTERECTOMY     Patient Active Problem List   Diagnosis Date Noted   Malignant neoplasm of upper-outer quadrant of right breast in female, estrogen receptor negative ([place]) 05/24/2022   Malignant neoplasm of overlapping sites of left breast in female, estrogen receptor positive ([place]) 05/24/2022   Acute respiratory failure due to COVID-19 [facility]) 07/13/2020   Acute  encephalopathy 07/13/2020   Renal insufficiency 07/13/2020   Neuritis 04/23/2015   Neuralgia 04/23/2015   Neuropathy 04/23/2015   Hyperalgesia 04/23/2015   Allodynia 04/23/2015   Obese 08/02/2012   Anxiety 06/07/2011   Hyperlipidemia    NECK PAIN 04/20/2010   Essential hypertension 11/29/2008   TOBACCO USE 09/30/2007   SYNDROME, CHRONIC PAIN 03/24/2007   MENOPAUSAL DISORDER 03/24/2007   GERD 01/04/2007   CHOLELITHIASIS 01/04/2007    REFERRING PROVIDER: Dr. Autumn Messing  REFERRING DIAG: Bilateral breast cancer  THERAPY DIAG:  Bilateral malignant neoplasm of overlapping sites of breast in female, unspecified estrogen receptor status ([place])  Abnormal posture  Rationale for Evaluation and Treatment: Rehabilitation  ONSET DATE: 03/22/2022  SUBJECTIVE:  SUBJECTIVE STATEMENT: Patient reports she is here today to be seen by her medical team for her newly diagnosed bilateral breast cancer.   PERTINENT HISTORY:  Patient was diagnosed on 03/22/2022 with bilateral grade 1 invasive ductal carcinoma and grade 2 invasive lobular carcinoma on the left breast. 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 There are multiple masses in te left breast measuring 1.9 cm, 3.6 cm, and 1.4 cm. There is a 3 cm area on the right side. The left breast is ER positive, PR negative, and HER2 negative with a Ki67 of < 1%. The right breast is triple negative with a Ki67 of 0% but only 1 mm of the 3 cm is invasive disease and the rest is DCIS. Prognostics will be repeated on final pathology. She has hypertension, diabetes, and a history of multiple back surgeries. She reports no interest in changing her diet or beginning an exercise program.  PATIENT GOALS:   reduce lymphedema risk and learn post op HEP.    PAIN:  Are you having pain? Yes: NPRS scale: 5/10 Pain location: back and bilateral legs Pain description: nerve pain Aggravating factors: unknown Relieving factors: medication  PRECAUTIONS: Active CA   HAND DOMINANCE: right  WEIGHT BEARING RESTRICTIONS: No  FALLS:  Has patient fallen in last 6 months? No  LIVING ENVIRONMENT: Patient lives with: her husband Lives in: House/apartment Has following equipment at home: None  OCCUPATION: retired  LEISURE: She does not exercise  PRIOR LEVEL OF FUNCTION: Independent   OBJECTIVE:  COGNITION: Overall cognitive status: Within functional limits for tasks assessed    POSTURE:  Forward head and rounded shoulders posture  UPPER EXTREMITY AROM/PROM:  A/PROM RIGHT   eval   Shoulder extension 51  Shoulder flexion 140  Shoulder abduction 168  Shoulder internal rotation 77  Shoulder external rotation 77    (Blank rows = not tested)  A/PROM LEFT   eval  Shoulder extension 56  Shoulder flexion 146  Shoulder abduction 153  Shoulder internal rotation 70  Shoulder external rotation 87    (Blank rows = not tested)  CERVICAL AROM: All within normal limits  UPPER EXTREMITY STRENGTH: WFL  LYMPHEDEMA ASSESSMENTS:   LANDMARK RIGHT   eval  10 cm proximal to olecranon process 35  Olecranon process 27  10 cm proximal to ulnar styloid process 23.9  Just proximal to ulnar styloid process 17.2  Across hand at thumb web space 19  At base of 2nd digit 7.3  (Blank rows = not tested)  LANDMARK LEFT   eval  10 cm proximal to olecranon process 33  Olecranon process 26.5  10 cm proximal to ulnar styloid process 22.2  Just proximal to ulnar styloid process 17  Across hand at thumb web space 18.9  At base of 2nd digit 7.1  (Blank rows = not tested)  L-DEX LYMPHEDEMA SCREENING:  The patient was assessed using the L-Dex machine today to produce a lymphedema index baseline score. The patient will be reassessed on a regular  basis (typically every 3 months) to obtain new L-Dex scores. If the score is > 6.5 points away from his/her baseline score indicating onset of subclinical lymphedema, it will be recommended to wear a compression garment for 4 weeks, 12 hours per day and then be reassessed. If the score continues to be > 6.5 points from baseline at reassessment, we will initiate lymphedema treatment. Assessing in this manner has a 95% rate of preventing clinically significant lymphedema.  L-DEX FLOWSHEETS - 05/26/22 1600       L-DEX LYMPHEDEMA SCREENING   Measurement Type --   Bilateral breast cancer; unable to test            QUICK DASH SURVEY:  Katina Dung - 05/26/22 0001     Open a tight or new jar No difficulty    Do heavy household chores (wash walls, wash floors) No difficulty    Carry a shopping bag or briefcase No difficulty    Wash your back No difficulty    Use a knife to cut food No difficulty    Recreational activities in which you take some force or impact through your arm, shoulder, or hand (golf, hammering, tennis) No difficulty    During the past week, to what extent has your arm, shoulder or hand problem interfered with your normal social activities with family, friends, neighbors, or groups? Quite a bit    During the past week, to what extent has your arm, shoulder or hand problem limited your work or other regular daily activities Slightly    Arm, shoulder, or hand pain. Mild    Tingling (pins and needles) in your arm, shoulder, or hand None    Difficulty Sleeping Mild difficulty    DASH Score 13.64 %              PATIENT EDUCATION:  Education details: Lymphedema risk reduction and post op shoulder/posture HEP Person educated: Patient Education method: Explanation, Demonstration, Handout Education comprehension: Patient verbalized understanding and returned demonstration  HOME EXERCISE PROGRAM: Patient was instructed today in a home exercise program today for post op  shoulder range of motion. These included active assist shoulder flexion in sitting, scapular retraction, wall walking with shoulder abduction, and hands behind head external rotation.  She was encouraged to do these twice a day, holding 3 seconds and repeating 5 times when permitted by her physician.   ASSESSMENT:  CLINICAL IMPRESSION: Patient was diagnosed on 03/22/2022 with bilateral grade 1 invasive ductal carcinoma and grade 2 invasive lobular carcinoma on the left breast. 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 There are multiple masses in te left breast measuring 1.9 cm, 3.6 cm, and 1.4 cm. There is a 3 cm area on the right side. The left breast is ER positive, PR negative, and HER2 negative with a Ki67 of < 1%. The right breast is triple negative with a Ki67 of 0% but only 1 mm of the 3 cm is invasive disease and the rest is DCIS. Prognostics will be repeated on final pathology. She has hypertension, diabetes, and a history of multiple back surgeries. She reports no interest in changing her diet or beginning an exercise program. Her multidisciplinary medical team met prior to her assessments to determine a recommended treatment plan. She is planning to have a bilateral mastectomy and bilateral sentinel node biopsies followed by anti-estrogen therapy. She will benefit from a post op PT reassessment to determine needs and from L-Dex screens every 3 months for 2 years to detect subclinical lymphedema.  Pt will benefit from skilled therapeutic intervention to improve on the following deficits: Decreased knowledge of precautions, impaired UE functional use, pain, decreased ROM, postural dysfunction.   PT treatment/interventions: ADL/self-care home management, pt/family education, therapeutic exercise  REHAB POTENTIAL: Fair - Due to pt reporting she is not wanting to participate in PT post operatively except for a 1 time visit CLINICAL DECISION MAKING:  Stable/uncomplicated  EVALUATION COMPLEXITY: Low  GOALS: Goals reviewed with patient? YES  LONG TERM GOALS: (STG=LTG)    Name Target Date Goal status  1 Pt will be able to verbalize understanding of pertinent lymphedema risk reduction practices relevant to her dx specifically related to skin care.  Baseline:  No knowledge 05/26/2022 Achieved at eval  2 Pt will be able to return demo and/or verbalize understanding of the post op HEP related to regaining shoulder ROM. Baseline:  No knowledge 05/26/2022 Achieved at eval  3 Pt will be able to verbalize understanding of the importance of attending the post op After Breast CA Class for further lymphedema risk reduction education and therapeutic exercise.  Baseline:  No knowledge 05/26/2022 Achieved at eval  4 Pt will demo she has regained full shoulder ROM and function post operatively compared to baselines.  Baseline: See objective measurements taken today. 07/21/2022     PLAN:  PT FREQUENCY/DURATION: EVAL and 1 follow up appointment.   PLAN FOR NEXT SESSION: will reassess 3-4 weeks post op to determine needs.   Patient will follow up at outpatient cancer rehab 3-4 weeks following surgery.  If the patient requires physical therapy at that time, a specific plan will be dictated and sent to the referring physician for approval. The patient was educated today on appropriate basic range of motion exercises to begin post operatively and the importance of attending the After Breast Cancer class following surgery.  Patient was educated today on lymphedema risk reduction practices as it pertains to recommendations that will benefit the patient immediately following surgery.  She verbalized good understanding.    Physical Therapy Information for After Breast Cancer Surgery/Treatment:  Lymphedema is a swelling condition that you may be at risk for in your arm if you have lymph nodes removed from the armpit area.  After a sentinel node biopsy, the  risk is approximately 5-9% and is higher after an axillary node dissection.  There is treatment available for this condition and it is not life-threatening.  Contact your physician or physical therapist with concerns. You may begin the 4 shoulder/posture exercises (see additional sheet) when permitted by your physician (typically a week after surgery).  If you have drains, you may need to wait until those are removed before beginning range of motion exercises.  A general recommendation is to not lift your arms above shoulder height until drains are removed.  These exercises should be done to your tolerance and gently.  This is not a "no pain/no gain" type of recovery so listen to your body and stretch into the range of motion that you can tolerate, stopping if you have pain.  If you are having immediate reconstruction, ask your plastic surgeon about doing exercises as he or she may want you to wait. We encourage you to attend the free one time ABC (After Breast Cancer) class offered by [facility].  You will learn information related to lymphedema risk, prevention and treatment and additional exercises to regain mobility following surgery.  You can call [phone] for more information.  This is offered the 1st and 3rd Monday of each month.  You only attend the class one time. While undergoing any medical procedure or treatment, try to avoid blood pressure being taken or needle sticks from occurring on the arm on the side of cancer.   This recommendation begins after surgery and continues for the rest of your life.  This may help reduce your risk of getting lymphedema (swelling in your arm). An excellent resource  for those seeking information on lymphedema is the National Lymphedema Network's web site. It can be accessed at [place].org If you notice swelling in your hand, arm or breast at any time following surgery (even if it is many years from now), please contact your doctor or  physical therapist to discuss this.  Lymphedema can be treated at any time but it is easier for you if it is treated early on.  If you feel like your shoulder motion is not returning to normal in a reasonable amount of time, please contact your surgeon or physical therapist.  [facility] [phone]. [address],  [place] [address]  ABC CLASS After Breast Cancer Class  After Breast Cancer Class is a specially designed exercise class to assist you in a safe recover after having breast cancer surgery.  In this class you will learn how to get back to full function whether your drains were just removed or if you had surgery a month ago.  This one-time class is held the 1st and 3rd Monday of every month from 11:00 a.m. until 12:00 noon virtually.  This class is FREE and space is limited. For more information or to register for the next available class, call [phone].  Class Goals  Understand specific stretches to improve the flexibility of you chest and shoulder. Learn ways to safely strengthen your upper body and improve your posture. Understand the warning signs of infection and why you may be at risk for an arm infection. Learn about Lymphedema and prevention.  ** You do not attend this class until after surgery.  Drains must be removed to participate  Patient was instructed today in a home exercise program today for post op shoulder range of motion. These included active assist shoulder flexion in sitting, scapular retraction, wall walking with shoulder abduction, and hands behind head external rotation.  She was encouraged to do these twice a day, holding 3 seconds and repeating 5 times when permitted by her physician.  Annia Friendly, [place] 05/26/22 4:45 PM

## 2022-05-26 NOTE — Assessment & Plan Note (Addendum)
Screening mammogram detected bilateral breast abnormalities. 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%, PR 0%, HER2 negative, Ki-67 less than 1%), 1.4 cm mass (not biopsied) axilla negative Right breast: Calcifications 3 cm biopsy: Grade 2 IDC with DCIS ER 0%, PR 0%, HER2 negative, Ki-67 0%, axilla negative, additional distortion to be biopsied today  Pathology and radiology counseling: Discussed with the patient, the details of pathology including the type of breast cancer,the clinical staging, the significance of ER, PR and HER-2/neu receptors and the implications for treatment. After reviewing the pathology in detail, we proceeded to discuss the different treatment options between surgery, radiation, chemotherapy, antiestrogen therapies.  Treatment plan: Bilateral mastectomies Adjuvant chemotherapy if the pathology confirms that she has triple negative breast cancer.  If it is not triple negative then we will do Oncotype. Plus or minus postmastectomy radiation Antiestrogen therapy  Patient needs an extremely unhealthy diet rich in cholesterol and fats and sugar.  She does not seem to have any interest in changing her diet.  Return to clinic after surgery to discuss pathology report

## 2022-05-26 NOTE — Progress Notes (Signed)
[name] NOTE  Patient Care Team: Deland Pretty, MD as PCP - General (Internal Medicine) Rockwell Germany, RN as Oncology Nurse Navigator Mauro Kaufmann, RN as Oncology Nurse Navigator Nicholas Lose, MD as Consulting Physician (Hematology and Oncology) Jovita Kussmaul, MD as Consulting Physician (General Surgery) Eppie Gibson, MD as Attending Physician (Radiation Oncology)  CHIEF COMPLAINTS/PURPOSE OF CONSULTATION:  Newly diagnosed breast cancer  HISTORY OF PRESENTING ILLNESS:  [name] 61 y.o. female is here because of recent diagnosis of bilateral breast cancers.  Patient had a screening mammogram that revealed bilateral breast abnormalities.  In the left breast there were at least 4 areas of abnormality.  Biopsy of 2 of these lesions was performed.  One of them is invasive ductal carcinoma with DCIS that was ER positive PR negative HER2 negative and the second 1 was invasive lobular cancer with pleomorphic LCIS that was ER positive PR negative HER2 negative Ki-67 less than 1%.  The right breast had a 3 cm of calcifications and biopsies revealed grade 2 invasive ductal carcinoma with DCIS that was triple negative.  There was an additional distortion measuring 0.7 cm which was biopsied and results are pending. She came in today accompanied by her husband and 2 daughters.   I reviewed her records extensively and collaborated the history with the patient.  SUMMARY OF ONCOLOGIC HISTORY: Oncology History  Malignant neoplasm of upper-outer quadrant of right breast in female, estrogen receptor negative ([place])  05/14/2022 Initial Diagnosis   Screening mammogram detected bilateral breast abnormalities. 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%, PR 0%, HER2 negative, Ki-67 less than 1%), 1.4 cm mass  (not biopsied) axilla negative Right breast: Calcifications 3 cm biopsy: Grade 2 IDC with DCIS ER 0%, PR 0%, HER2 negative, Ki-67 0%, axilla negative, additional distortion to be biopsied today   Malignant neoplasm of overlapping sites of left breast in female, estrogen receptor positive ([place])  05/24/2022 Initial Diagnosis   Malignant neoplasm of overlapping sites of left breast in female, estrogen receptor positive ([place])   05/26/2022 Cancer Staging   Staging form: Breast, AJCC 8th Edition - Clinical: Stage IB (cT2, cN0, cM0, G2, ER+, PR+, HER2-) - Signed by Nicholas Lose, MD on 05/26/2022 Histologic grading system: 3 grade system      MEDICAL HISTORY:  Past Medical History:  Diagnosis Date   Arthritis    Bladder spasms    Breast cancer ([place])    GERD (gastroesophageal reflux disease)    Hyperlipidemia    Hypertension     SURGICAL HISTORY: Past Surgical History:  Procedure Laterality Date   [place], 2006   BREAST BIOPSY Left 05/14/2022   [place] LT BREAST BX W LOC DEV 1ST LESION IMG BX [place] US GUIDE 05/14/2022 [facility]   BREAST BIOPSY Right 05/14/2022   MM RT BREAST BX W LOC DEV 1ST LESION IMAGE BX SPEC STEREO GUIDE 05/14/2022 [facility]   BREAST BIOPSY Left 05/14/2022   MM LT BREAST BX W LOC DEV 1ST LESION IMAGE BX SPEC STEREO GUIDE 05/14/2022 [facility]   BREAST BIOPSY Right 05/26/2022   MM RT BREAST BX W LOC DEV 1ST LESION IMAGE BX SPEC STEREO GUIDE 05/26/2022 [facility]   BREAST BIOPSY Right 05/26/2022   MM RT BREAST BX W LOC DEV EA  AD LESION IMG BX SPEC STEREO GUIDE 05/26/2022 [facility]   BREAST LUMPECTOMY  1976   bi-lat , both benign    [place]  2009   work related injury   PARTIAL HYSTERECTOMY      SOCIAL HISTORY: Social History   Socioeconomic History   Marital status: Married    Spouse name: Ricky    Number of children: 2   Years of education: 9   Highest education level: Not  on file  Occupational History   Occupation: Pensions consultant  Tobacco Use   Smoking status: Former    Packs/day: 0.50    Years: 37.00    Total pack years: 18.50    Types: Cigarettes   Smokeless tobacco: Never  Substance and Sexual Activity   Alcohol use: No   Drug use: No   Sexual activity: Never    Comment: 61 YEARS OLD, NO MORE THAN 5 PARTNERS  Other Topics Concern   Not on file  Social History Narrative   Lives at home with husband   Caffeine use: none   Social Determinants of Radio broadcast assistant Strain: Not on file  Food Insecurity: Not on file  Transportation Needs: Not on file  Physical Activity: Not on file  Stress: Not on file  Social Connections: Not on file  Intimate Partner Violence: Not on file    FAMILY HISTORY: Family History  Problem Relation Age of Onset   Diabetes Mother    Hypertension Mother    Heart disease Mother    Hypertension Father    Diabetes Father    Heart disease Father    Breast cancer Sister 9   Neuropathy Neg Hx     ALLERGIES:  is allergic to codeine phosphate, simvastatin, and sulfamethoxazole.  MEDICATIONS:  Current Outpatient Medications  Medication Sig Dispense Refill   ALPRAZolam (XANAX) 0.25 MG tablet Take 0.25 mg by mouth 2 (two) times daily as needed for anxiety.  2   amitriptyline (ELAVIL) 50 MG tablet Take 50 mg by mouth at bedtime as needed.     amLODipine (NORVASC) 10 MG tablet Take 10 mg by mouth daily.     ezetimibe (ZETIA) 10 MG tablet Take 10 mg by mouth daily.     metFORMIN (GLUCOPHAGE-XR) 500 MG 24 hr tablet Take 500 mg by mouth 2 (two) times daily.     MOUNJARO 7.5 MG/0.5ML Pen SMARTSIG:7.5 Milligram(s) SUB-Q Every 4 Weeks     olmesartan (BENICAR) 20 MG tablet Take 20 mg by mouth daily.     oxyCODONE ER (XTAMPZA ER) 36 MG C12A Take 36 mg by mouth in the morning and at bedtime.     pantoprazole (PROTONIX) 40 MG tablet Take 40 mg by mouth daily.     albuterol (VENTOLIN HFA) 108 (90 Base) MCG/ACT inhaler  Inhale 2 puffs into the lungs every 6 (six) hours as needed for wheezing or shortness of breath. 6.7 g 0   dexlansoprazole (DEXILANT) 60 MG capsule Take 60 mg by mouth daily.     lidocaine (LIDODERM) 5 % Place 3 patches onto the skin daily. Remove & Discard patch within 12 hours or as directed by MD (Patient taking differently: No sig reported) 90 patch 11   loratadine (CLARITIN) 10 MG tablet Take 10 mg by mouth daily. For allergies     OZEMPIC, 0.25 OR 0.5 MG/DOSE, 2 MG/1.5ML SOPN Inject 2 mg into the skin once a week. Saturday     predniSONE (DELTASONE)  10 MG tablet Take 40 mg daily for 2 days, 30 mg daily for 2 days, 20 mg daily for 2 days,10 mg daily for 1 days, then stop 19 tablet 0   pregabalin (LYRICA) 100 MG capsule Take 100 mg by mouth 2 (two) times daily.     No current facility-administered medications for this visit.   Facility-Administered Medications Ordered in Other Visits  Medication Dose Route Frequency Provider Last Rate Last Admin   ketorolac (TORADOL) 15 MG/ML injection 15 mg  15 mg Intravenous Once Autumn Messing III, MD        REVIEW OF SYSTEMS:   Constitutional: Denies fevers, chills or abnormal night sweats Breast:  Denies any palpable lumps or discharge All other systems were reviewed with the patient and are negative.  PHYSICAL EXAMINATION: ECOG PERFORMANCE STATUS: 1 - Symptomatic but completely ambulatory  Vitals:   05/26/22 1240  BP: (!) 177/93  Pulse: 91  Resp: 18  Temp: 97.7 F (36.5 C)  SpO2: 100%   Filed Weights   05/26/22 1240  Weight: 221 lb 12.8 oz (100.6 kg)    GENERAL:alert, no distress and comfortable   LABORATORY DATA:  I have reviewed the data as listed Lab Results  Component Value Date   WBC 6.7 05/26/2022   HGB 12.9 05/26/2022   HCT 39.6 05/26/2022   MCV 92.7 05/26/2022   PLT 244 05/26/2022   Lab Results  Component Value Date   NA 141 05/26/2022   K 4.0 05/26/2022   CL 103 05/26/2022   CO2 28 05/26/2022    RADIOGRAPHIC  STUDIES: I have personally reviewed the radiological reports and agreed with the findings in the report.  ASSESSMENT AND PLAN:  Malignant neoplasm of upper-outer quadrant of right breast in female, estrogen receptor negative ([place]) Screening mammogram detected bilateral breast abnormalities. 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%, PR 0%, HER2 negative, Ki-67 less than 1%), 1.4 cm mass (not biopsied) axilla negative Right breast: Calcifications 3 cm biopsy: Grade 2 IDC with DCIS ER 0%, PR 0%, HER2 negative, Ki-67 0%, axilla negative, additional distortion to be biopsied today  Pathology and radiology counseling: Discussed with the patient, the details of pathology including the type of breast cancer,the clinical staging, the significance of ER, PR and HER-2/neu receptors and the implications for treatment. After reviewing the pathology in detail, we proceeded to discuss the different treatment options between surgery, radiation, chemotherapy, antiestrogen therapies.  Treatment plan: Bilateral mastectomies Adjuvant chemotherapy if the pathology confirms that she has triple negative breast cancer.  If it is not triple negative then we will do Oncotype. Plus or minus postmastectomy radiation Antiestrogen therapy  Patient needs an extremely unhealthy diet rich in cholesterol and fats and sugar.  She does not seem to have any interest in changing her diet.  Return to clinic after surgery to discuss pathology report  All questions were answered. The patient knows to call the clinic with any problems, questions or concerns.    Harriette Ohara, MD 05/26/22

## 2022-05-27 ENCOUNTER — Encounter: Payer: Self-pay | Admitting: Plastic Surgery

## 2022-05-27 ENCOUNTER — Ambulatory Visit: Payer: BC Managed Care – PPO | Admitting: Plastic Surgery

## 2022-05-27 ENCOUNTER — Encounter: Payer: Self-pay | Admitting: Genetic Counselor

## 2022-05-27 ENCOUNTER — Encounter: Payer: Self-pay | Admitting: *Deleted

## 2022-05-27 VITALS — BP 192/103 | HR 98

## 2022-05-27 DIAGNOSIS — C50411 Malignant neoplasm of upper-outer quadrant of right female breast: Secondary | ICD-10-CM | POA: Diagnosis not present

## 2022-05-27 DIAGNOSIS — C50812 Malignant neoplasm of overlapping sites of left female breast: Secondary | ICD-10-CM | POA: Diagnosis not present

## 2022-05-27 DIAGNOSIS — Z170 Estrogen receptor positive status [ER+]: Secondary | ICD-10-CM

## 2022-05-27 DIAGNOSIS — Z171 Estrogen receptor negative status [ER-]: Secondary | ICD-10-CM

## 2022-05-27 NOTE — Progress Notes (Signed)
Patient ID: [name], female    DOB: Mar 21, 1961, 61 y.o.   MRN: [id number]   Chief Complaint  Patient presents with   Advice Only    The patient is a 61 year old female here for evaluation and consultation for breast reconstruction.  She had a lesion removed from her breast many years ago which was benign.  She has a scar on the right upper breast as a result from that biopsy.  She did not have radiation.  The patient went for the past 10 years without a mammogram and due to her sister finding breast cancer she got the mammogram.  She was found to have bilateral breast cancer.  The left breast is overlapping sites with estrogen positive receptors and is staged as IB the right breast has a cancer in the upper outer quadrant is estrogen negative and is staged as to be IIB.It is invasive ductal carcinoma with DCIS estrogen positive progesterone negative and HER2 negative.  She also has invasive lobular cancer with pleomorphic LCIS that is estrogen positive progesterone negative and HER2 negative with Ki-67 less than 1%.  She is here with her aunt. She is 5 feet 5 inches tall and weighs 217 pounds and her bra is a CD cup. She has grade II ptosis.  She is not a smoker but she does have diabetes.   Review of Systems  Constitutional: Negative.   Eyes: Negative.   Respiratory: Negative.    Cardiovascular: Negative.   Gastrointestinal: Negative.   Endocrine: Negative.   Genitourinary: Negative.   Musculoskeletal: Negative.   Skin: Negative.     Past Medical History:  Diagnosis Date   Arthritis    Bladder spasms    Breast cancer ([place])    GERD (gastroesophageal reflux disease)    Hyperlipidemia    Hypertension     Past Surgical History:  Procedure Laterality Date   BACK SURGERY  1989, 1990, 2006   BREAST BIOPSY Left 05/14/2022   [place] LT BREAST BX W LOC DEV 1ST LESION IMG BX SPEC US GUIDE 05/14/2022 [facility]   BREAST BIOPSY Right 05/14/2022   MM RT BREAST BX W LOC DEV 1ST  LESION IMAGE BX SPEC STEREO GUIDE 05/14/2022 [facility]   BREAST BIOPSY Left 05/14/2022   MM LT BREAST BX W LOC DEV 1ST LESION IMAGE BX SPEC STEREO GUIDE 05/14/2022 [facility]   BREAST BIOPSY Right 05/26/2022   MM RT BREAST BX W LOC DEV 1ST LESION IMAGE BX SPEC STEREO GUIDE 05/26/2022 [facility]   BREAST BIOPSY Right 05/26/2022   MM RT BREAST BX W LOC DEV EA AD LESION IMG BX SPEC STEREO GUIDE 05/26/2022 [facility]   BREAST LUMPECTOMY  1976   bi-lat , both benign    CHOLECYSTECTOMY  1998   LEG SURGERY  2009   work related injury   PARTIAL HYSTERECTOMY        Current Outpatient Medications:    albuterol (VENTOLIN HFA) 108 (90 Base) MCG/ACT inhaler, Inhale 2 puffs into the lungs every 6 (six) hours as needed for wheezing or shortness of breath., Disp: 6.7 g, Rfl: 0   ALPRAZolam (XANAX) 0.25 MG tablet, Take 0.25 mg by mouth 2 (two) times daily as needed for anxiety., Disp: , Rfl: 2   amitriptyline (ELAVIL) 50 MG tablet, Take 50 mg by mouth at bedtime as needed., Disp: , Rfl:    amLODipine (NORVASC) 10 MG tablet, Take 10 mg by mouth daily., Disp: , Rfl:  dexlansoprazole (DEXILANT) 60 MG capsule, Take 60 mg by mouth daily., Disp: , Rfl:    ezetimibe (ZETIA) 10 MG tablet, Take 10 mg by mouth daily., Disp: , Rfl:    lidocaine (LIDODERM) 5 %, Place 3 patches onto the skin daily. Remove & Discard patch within 12 hours or as directed by MD (Patient taking differently: No sig reported), Disp: 90 patch, Rfl: 11   loratadine (CLARITIN) 10 MG tablet, Take 10 mg by mouth daily. For allergies, Disp: , Rfl:    metFORMIN (GLUCOPHAGE-XR) 500 MG 24 hr tablet, Take 500 mg by mouth 2 (two) times daily., Disp: , Rfl:    MOUNJARO 7.5 MG/0.5ML Pen, SMARTSIG:7.5 Milligram(s) SUB-Q Every 4 Weeks, Disp: , Rfl:    olmesartan (BENICAR) 20 MG tablet, Take 20 mg by mouth daily., Disp: , Rfl:    oxyCODONE ER (XTAMPZA ER) 36 MG C12A, Take 36 mg by mouth in the morning and at bedtime., Disp: ,  Rfl:    OZEMPIC, 0.25 OR 0.5 MG/DOSE, 2 MG/1.5ML SOPN, Inject 2 mg into the skin once a week. Saturday, Disp: , Rfl:    pantoprazole (PROTONIX) 40 MG tablet, Take 40 mg by mouth daily., Disp: , Rfl:    predniSONE (DELTASONE) 10 MG tablet, Take 40 mg daily for 2 days, 30 mg daily for 2 days, 20 mg daily for 2 days,10 mg daily for 1 days, then stop, Disp: 19 tablet, Rfl: 0   pregabalin (LYRICA) 100 MG capsule, Take 100 mg by mouth 2 (two) times daily., Disp: , Rfl:  No current facility-administered medications for this visit.  Facility-Administered Medications Ordered in Other Visits:    ketorolac (TORADOL) 15 MG/ML injection 15 mg, 15 mg, Intravenous, Once, Autumn Messing III, MD   Objective:   Vitals:   05/27/22 1410  BP: (!) 192/103  Pulse: 98  SpO2: 96%    Physical Exam Vitals and nursing note reviewed.  Constitutional:      Appearance: Normal appearance. She is obese.  HENT:     Head: Normocephalic and atraumatic.  Cardiovascular:     Rate and Rhythm: Normal rate.     Pulses: Normal pulses.  Pulmonary:     Effort: Pulmonary effort is normal.  Abdominal:     General: There is no distension.     Palpations: Abdomen is soft. There is no mass.     Tenderness: There is no abdominal tenderness.  Musculoskeletal:        General: Swelling present. No deformity.  Skin:    Capillary Refill: Capillary refill takes less than 2 seconds.     Coloration: Skin is not jaundiced.     Findings: Bruising and lesion present.  Neurological:     Mental Status: She is alert and oriented to person, place, and time.  Psychiatric:        Mood and Affect: Mood normal.        Behavior: Behavior normal.        Thought Content: Thought content normal.        Judgment: Judgment normal.     Assessment & Plan:  Malignant neoplasm of upper-outer quadrant of right breast in female, estrogen receptor negative ([place])  Malignant neoplasm of overlapping sites of left breast in female, estrogen receptor  positive ([place])  The options for reconstruction we explained to the patient / family for breast reconstruction.  There are two general categories of reconstruction.  We can reconstruction a breast with implants or use the patient's own tissue.  These  were further discussed as listed.  Breast reconstruction is an optional procedure and eligibility depends on the full spectrum of the health of the patient and any co-morbidities.  More than one surgery is often needed to complete the reconstruction process.  The process can take three to twelve months to complete.  The breasts will not be identical due to many factors such as rib differences, shoulder asymmetry and treatments such as radiation.  The goal is to get the breasts to look normal and symmetrical in clothes.  Scars are a part of surgery and may fade some in time but will always be present under clothes.  Surgery may be an option on the non-cancer breast to achieve more symmetry.  No matter which procedure is chosen there is always the risk of complications and even failure of the body to heal.  This could result in no breast.    The options for reconstruction include:  1. Placement of a tissue expander with Acellular dermal matrix. When the expander is the desired size surgery is performed to remove the expander and place an implant.  In some cases the implant can be placed without an expander.  2. Autologous reconstruction can include using a muscle or tissue from another area of the body to create a breast.  3. Combined procedures (ie. latissismus dorsi flap) can be done with an expander / implant placed under the muscle.   The risks, benefits, scars and recovery time were discussed for each of the above. Risks include bleeding, infection, hematoma, seroma, scarring, pain, wound healing complications, flap loss, fat necrosis, capsular contracture, need for implant removal, donor site complications, bulge, hernia, umbilical necrosis, need for  urgent reoperation, and need for dressing changes.   The procedure the patient selected / that was best for the patient, was then discussed in further detail.  Total time: 45 minutes. This includes time spent with the patient during the visit as well as time spent before and after the visit reviewing the chart, documenting the encounter, making phone calls and reviewing studies.   The patient's body habitus is such that she is not a great candidate for reconstruction along with the fact that she has diabetes.  However it is reasonable to try.  I have told her that her risks are high for complications but it is fair to try.  She would like to have expanders and implant-based reconstruction.  Possibly with saline but we can talk about that further later.  She would like to have bilateral mastectomies with bilateral expander and Flex HD placement.  Pictures were obtained of the patient and placed in the chart with the patient's or guardian's permission.    [place], DO

## 2022-05-27 NOTE — Progress Notes (Signed)
REFERRING PROVIDER: Nicholas Lose, MD [place],  [place] [id number]  PRIMARY PROVIDER:  Deland Pretty, MD  PRIMARY REASON FOR VISIT:  1. Malignant neoplasm of overlapping sites of left breast in female, estrogen receptor positive ([place])   2. Malignant neoplasm of upper-outer quadrant of right breast in female, estrogen receptor negative ([place])   3. Family history of breast cancer     HISTORY OF PRESENT ILLNESS:   [name], a 61 y.o. female, was seen for a [place] cancer genetics consultation during the breast multidisciplinary clinic at the request of [name] due to a personal history of multiple breast primaries.  [name] presents to clinic today to discuss the possibility of a hereditary predisposition to cancer, to discuss genetic testing, and to further clarify her future cancer risks, as well as potential cancer risks for family members.   In December 2023, at the age of 89, [name] was diagnosed with invasive ductal carcinoma of the left breast (ER+/PR-/HER2-), invasive lobular carcinoma of the left breast (ER+/PR-/HER2-), and invasive ductal carcinoma of the right breast (triple negative). The preliminary treatment plan includes bilateral mastectomies.   CANCER HISTORY:  Oncology History  Malignant neoplasm of upper-outer quadrant of right breast in female, estrogen receptor negative ([place])  05/14/2022 Initial Diagnosis   Screening mammogram detected bilateral breast abnormalities. 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%, PR 0%, HER2 negative, Ki-67 less than 1%), 1.4 cm mass (not biopsied) axilla negative Right breast: Calcifications 3 cm biopsy: Grade 2 IDC with DCIS ER 0%, PR 0%, HER2 negative, Ki-67 0%, axilla negative, additional distortion to be biopsied today   Malignant  neoplasm of overlapping sites of left breast in female, estrogen receptor positive ([place])  05/24/2022 Initial Diagnosis   Malignant neoplasm of overlapping sites of left breast in female, estrogen receptor positive ([place])   05/26/2022 Cancer Staging   Staging form: Breast, AJCC 8th Edition - Clinical: Stage IB (cT2, cN0, cM0, G2, ER+, PR+, HER2-) - Signed by Nicholas Lose, MD on 05/26/2022 Histologic grading system: 3 grade system      Past Medical History:  Diagnosis Date   Arthritis    Bladder spasms    Breast cancer ([place])    GERD (gastroesophageal reflux disease)    Hyperlipidemia    Hypertension     Past Surgical History:  Procedure Laterality Date   [place], 1990, 2006   BREAST BIOPSY Left 05/14/2022   [place] LT BREAST BX W LOC DEV 1ST LESION IMG BX SPEC US GUIDE 05/14/2022 [facility]   BREAST BIOPSY Right 05/14/2022   MM RT BREAST BX W LOC DEV 1ST LESION IMAGE BX SPEC STEREO GUIDE 05/14/2022 [facility]   BREAST BIOPSY Left 05/14/2022   MM LT BREAST BX W LOC DEV 1ST LESION IMAGE BX SPEC STEREO GUIDE 05/14/2022 [facility]   BREAST BIOPSY Right 05/26/2022   MM RT BREAST BX W LOC DEV 1ST LESION IMAGE BX SPEC STEREO GUIDE 05/26/2022 [facility]   BREAST BIOPSY Right 05/26/2022   MM RT BREAST BX W LOC DEV EA AD LESION IMG BX SPEC STEREO GUIDE 05/26/2022 [facility]   BREAST LUMPECTOMY  1976   bi-lat , both benign    CHOLECYSTECTOMY  1998   LEG SURGERY  2009   work related injury  PARTIAL HYSTERECTOMY       FAMILY HISTORY:  We obtained a detailed, 4-generation family history.  Significant diagnoses are listed below: Family History  Problem Relation Age of Onset   Breast cancer Sister 54   Lymphoma Paternal Grandfather        dx after 19    [name]'s sister, Horris Latino, had negative genetics through [facility] Multi-Cancer +RNA Panel in August 2023.    [name] is unaware of other previous family history of genetic testing for  hereditary cancer risks. There is no reported Ashkenazi Jewish ancestry. There is no known consanguinity.  GENETIC COUNSELING ASSESSMENT: [name] is a 61 y.o. female with a personal history of cancer which is somewhat suggestive of a hereditary cancer syndrome and predisposition to cancer given her history of multiple primary breast cancers. We, therefore, discussed and recommended the following at today's visit.   DISCUSSION: We discussed that 5 - 10% of cancer is hereditary, with most cases of hereditary breast cancer associated with mutations in BRCA1/2.  There are other genes that can be associated with hereditary breast cancer. Type of cancer risk and level of risk are gene-specific. We discussed that testing is beneficial for several reasons including knowing how to follow individuals after completing their treatment, identifying whether potential treatment options would be beneficial, and understanding if other family members could be at risk for cancer and allowing them to undergo genetic testing.   We reviewed the characteristics, features and inheritance patterns of hereditary cancer syndromes. We also discussed genetic testing, including the appropriate family members to test, the process of testing, insurance coverage and turn-around-time for results. We discussed the implications of a negative, positive and/or variant of uncertain significant result. In order to get genetic test results in a timely manner so that [name] can use these genetic test results for surgical decisions, we recommended [name] pursue genetic testing for the [facility] BRCAPlus Panel.  The BRCAplus panel offered by [facility] and includes sequencing and deletion/duplication analysis for the following 8 genes: ATM, BRCA1, BRCA2, CDH1, CHEK2, PALB2, PTEN, and TP53. Once complete, we recommend [name] pursue reflex genetic testing to a more comprehensive gene panel.   The CancerNext-Expanded gene panel offered by  [facility] and includes sequencing, rearrangement, and RNA analysis for the following 77 genes: AIP, ALK, APC, ATM, AXIN2, BAP1, BARD1, BLM, BMPR1A, BRCA1, BRCA2, BRIP1, CDC73, CDH1, CDK4, CDKN1B, CDKN2A, CHEK2, CTNNA1, DICER1, FANCC, FH, FLCN, GALNT12, KIF1B, LZTR1, MAX, MEN1, MET, MLH1, MSH2, MSH3, MSH6, MUTYH, NBN, NF1, NF2, NTHL1, PALB2, PHOX2B, PMS2, POT1, PRKAR1A, PTCH1, PTEN, RAD51C, RAD51D, RB1, RECQL, RET, SDHA, SDHAF2, SDHB, SDHC, SDHD, SMAD4, SMARCA4, SMARCB1, SMARCE1, STK11, SUFU, TMEM127, TP53, TSC1, TSC2, VHL and XRCC2 (sequencing and deletion/duplication); EGFR, EGLN1, HOXB13, KIT, MITF, PDGFRA, POLD1, and POLE (sequencing only); EPCAM and GREM1 (deletion/duplication only).   Based on [name]'s personal history of breast cancer, she meets medical criteria for genetic testing. Despite that she meets criteria, she may still have an out of pocket cost. We discussed that if her out of pocket cost for testing is over $100, the laboratory should contact them to discuss self-pay prices, patient pay assistance programs, if applicable, and other billing options.   PLAN: After considering the risks, benefits, and limitations, [name] provided informed consent to pursue genetic testing and the blood sample was sent to [facility] for BRCAPlus and CancerNext-Expanded +RNAinsight. Results should be available within approximately 1-2 weeks' time, at which point they will be disclosed by  telephone to [name], as will any additional recommendations warranted by these results. [name] will receive a summary of her genetic counseling visit and a copy of her results once available. This information will also be available in Epic.    [name]'s questions were answered to her satisfaction today. Our contact information was provided should additional questions or concerns arise. Thank you for the referral and allowing [place] to share in the care of your patient.   Xylina Rhoads M. Joette Catching, [place],  [facility] Genetic Counselor [email or web address] (P) [phone]  The patient was seen for a total of 15 minutes in face-to-face genetic counseling.  Patient accompanied by her daughters and husband. [name] was available to discuss this case as needed.  _______________________________________________________________________ For Office Staff:  Number of people involved in session: 4 Was an Intern/ student involved with case: no

## 2022-05-28 ENCOUNTER — Encounter: Payer: Self-pay | Admitting: *Deleted

## 2022-06-01 ENCOUNTER — Telehealth: Payer: Self-pay | Admitting: *Deleted

## 2022-06-01 ENCOUNTER — Encounter: Payer: Self-pay | Admitting: *Deleted

## 2022-06-01 NOTE — Telephone Encounter (Signed)
Spoke to pt concerning [place] from 05/26/22. Denies questions or concerns regarding dx or treatment care plan. Encourage pt to call with needs. Received verbal understanding.

## 2022-06-02 DIAGNOSIS — G894 Chronic pain syndrome: Secondary | ICD-10-CM | POA: Diagnosis not present

## 2022-06-02 DIAGNOSIS — Z79891 Long term (current) use of opiate analgesic: Secondary | ICD-10-CM | POA: Diagnosis not present

## 2022-06-02 DIAGNOSIS — G8918 Other acute postprocedural pain: Secondary | ICD-10-CM | POA: Diagnosis not present

## 2022-06-02 DIAGNOSIS — M961 Postlaminectomy syndrome, not elsewhere classified: Secondary | ICD-10-CM | POA: Diagnosis not present

## 2022-06-03 ENCOUNTER — Encounter: Payer: Self-pay | Admitting: Genetic Counselor

## 2022-06-03 ENCOUNTER — Telehealth: Payer: Self-pay | Admitting: Genetic Counselor

## 2022-06-03 DIAGNOSIS — Z1379 Encounter for other screening for genetic and chromosomal anomalies: Secondary | ICD-10-CM | POA: Insufficient documentation

## 2022-06-03 NOTE — Telephone Encounter (Signed)
Revealed negative BRCAPlus.  Pan-cancer panel is pending.

## 2022-06-11 ENCOUNTER — Ambulatory Visit: Payer: Self-pay | Admitting: Genetic Counselor

## 2022-06-11 ENCOUNTER — Encounter: Payer: BC Managed Care – PPO | Admitting: Physician Assistant

## 2022-06-11 DIAGNOSIS — Z803 Family history of malignant neoplasm of breast: Secondary | ICD-10-CM

## 2022-06-11 DIAGNOSIS — Z170 Estrogen receptor positive status [ER+]: Secondary | ICD-10-CM

## 2022-06-11 DIAGNOSIS — Z171 Estrogen receptor negative status [ER-]: Secondary | ICD-10-CM

## 2022-06-11 DIAGNOSIS — Z1379 Encounter for other screening for genetic and chromosomal anomalies: Secondary | ICD-10-CM

## 2022-06-11 NOTE — Telephone Encounter (Signed)
Revealed negative pan-cancer panel.    

## 2022-06-15 NOTE — Progress Notes (Signed)
HPI:   Ms. Wigle was previously seen in the [place] clinic due to a personal and family history of breast cancer and concerns regarding a hereditary predisposition to cancer. Please refer to our prior cancer genetics clinic note for more information regarding our discussion, assessment and recommendations, at the time. Ms. Jacobson recent genetic test results were disclosed to her, as were recommendations warranted by these results. These results and recommendations are discussed in more detail below.  CANCER HISTORY:  Oncology History  Malignant neoplasm of upper-outer quadrant of right breast in female, estrogen receptor negative ([place])  05/14/2022 Initial Diagnosis   Screening mammogram detected bilateral breast abnormalities. 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%, PR 0%, HER2 negative, Ki-67 less than 1%), 1.4 cm mass (not biopsied) axilla negative Right breast: Calcifications 3 cm biopsy: Grade 2 IDC with DCIS ER 0%, PR 0%, HER2 negative, Ki-67 0%, axilla negative, additional distortion to be biopsied today   06/02/2022 Genetic Testing   Negative BRCAPlus Panel.  Report date is 06/02/2022.   The BRCAplus panel offered by [facility] and includes sequencing and deletion/duplication analysis for the following 8 genes: ATM, BRCA1, BRCA2, CDH1, CHEK2, PALB2, PTEN, and TP53.  Pan-cancer panel is pending.    Malignant neoplasm of overlapping sites of left breast in female, estrogen receptor positive ([place])  05/24/2022 Initial Diagnosis   Malignant neoplasm of overlapping sites of left breast in female, estrogen receptor positive ([place])   05/26/2022 Cancer Staging   Staging form: Breast, AJCC 8th Edition - Clinical: Stage IB (cT2, cN0, cM0, G2, ER+, PR+, HER2-) - Signed by Nicholas Lose, MD on  05/26/2022 Histologic grading system: 3 grade system   06/02/2022 Genetic Testing   Negative BRCAPlus Panel.  Report date is 06/02/2022.   The BRCAplus panel offered by [facility] and includes sequencing and deletion/duplication analysis for the following 8 genes: ATM, BRCA1, BRCA2, CDH1, CHEK2, PALB2, PTEN, and TP53.  Pan-cancer panel is pending.      FAMILY HISTORY:  We obtained a detailed, 4-generation family history.  Significant diagnoses are listed below:      Family History  Problem Relation Age of Onset   Breast cancer Sister 70   Lymphoma Paternal Grandfather          dx after 74     Ms. Vivero's sister, Horris Latino, had negative genetics through [facility] Multi-Cancer +RNA Panel in August 2023.     Ms. Alcivar is unaware of other previous family history of genetic testing for hereditary cancer risks. There is no reported Ashkenazi Jewish ancestry. There is no known consanguinity.  GENETIC TEST RESULTS:  The [facility] CancerNext-Expanded +RNAinsight Panel found no pathogenic mutations.   The CancerNext-Expanded gene panel offered by [facility] and includes sequencing, rearrangement, and RNA analysis for the following 77 genes: AIP, ALK, APC, ATM, AXIN2, BAP1, BARD1, BLM, BMPR1A, BRCA1, BRCA2, BRIP1, CDC73, CDH1, CDK4, CDKN1B, CDKN2A, CHEK2, CTNNA1, DICER1, FANCC, FH, FLCN, GALNT12, KIF1B, LZTR1, MAX, MEN1, MET, MLH1, MSH2, MSH3, MSH6, MUTYH, NBN, NF1, NF2, NTHL1, PALB2, PHOX2B, PMS2, POT1, PRKAR1A, PTCH1, PTEN, RAD51C, RAD51D, RB1, RECQL, RET, SDHA, SDHAF2, SDHB, SDHC, SDHD, SMAD4, SMARCA4, SMARCB1, SMARCE1, STK11, SUFU, TMEM127, TP53, TSC1, TSC2, VHL and XRCC2 (sequencing and deletion/duplication); EGFR, EGLN1, HOXB13, KIT, MITF, PDGFRA, POLD1, and POLE (sequencing only); EPCAM and GREM1 (deletion/duplication only).  Marland Kitchen  The test report has been scanned into EPIC and is located under the Molecular Pathology section of the Results Review tab.  A portion of the result report is  included below for reference. Genetic testing reported out on June 03, 2022.     Even though a pathogenic variant was not identified, possible explanations for the cancer in the family may include: There may be no hereditary risk for cancer in the family. The cancers in Ms. Markert and/or her family may be sporadic/familial or due to other genetic and environmental factors. There may be a gene mutation in one of these genes that current testing methods cannot detect but that chance is small. There could be another gene that has not yet been discovered, or that we have not yet tested, that is responsible for the cancer diagnoses in the family.  It is also possible there is a hereditary cause for the cancer in the family that Ms. Hyppolite did not inherit.   Therefore, it is important to remain in touch with cancer genetics in the future so that we can continue to offer Ms. Domke the most up to date genetic testing.    ADDITIONAL GENETIC TESTING:  We discussed with Ms. Depp that her genetic testing was fairly extensive.  If there are additional relevant genes identified to increase cancer risk that can be analyzed in the future, we would be happy to discuss and coordinate this testing at that time.    CANCER SCREENING RECOMMENDATIONS:  Ms. Riso test result is considered negative (normal).  This means that we have not identified a hereditary cause for her personal history of breast cancer at this time.   An individual's cancer risk and medical management are not determined by genetic test results alone. Overall cancer risk assessment incorporates additional factors, including personal medical history, family history, and any available genetic information that may result in a personalized plan for cancer prevention and surveillance. Therefore, it is recommended she continue to follow the cancer management and screening guidelines provided by her oncology and primary healthcare  provider.  RECOMMENDATIONS FOR FAMILY MEMBERS:   Since she did not inherit a identifiable mutation in a cancer predisposition gene included on this panel, her children could not have inherited a known mutation from her in one of these genes. Individuals in this family might be at some increased risk of developing cancer, over the general population risk, due to the family history of cancer.  Individuals in the family should notify their providers of the family history of cancer. We recommend women in this family have a yearly mammogram beginning at age 25, or 51 years younger than the earliest onset of cancer, an annual clinical breast exam, and perform monthly breast self-exams.    FOLLOW-UP:  Lastly, we discussed with Ms. Giroux that cancer genetics is a rapidly advancing field and it is possible that new genetic tests will be appropriate for her and/or her family members in the future. We encouraged her to remain in contact with cancer genetics on an annual basis so we can update her personal and family histories and let her know of advances in cancer genetics that may benefit this family.   Our contact number was provided. Ms. Bensman questions were answered to her satisfaction, and she knows she is welcome to call us at anytime with additional questions or concerns.   Emersen Carroll M. Joette Catching, [place], [facility] Genetic Counselor [email or web address] [email or web address] (P) [phone]

## 2022-06-17 NOTE — Progress Notes (Signed)
Patient ID: [name], female    DOB: February 26, 1961, 62 y.o.   MRN: [id number]  Chief Complaint  Patient presents with   Pre-op Exam      ICD-10-CM   1. Malignant neoplasm of upper-outer quadrant of right breast in female, estrogen receptor negative ([place])  C50.411    Z17.1     2. Malignant neoplasm of overlapping sites of left breast in female, estrogen receptor positive ([place])  C50.812    Z17.0        History of Present Illness: [name] is a 62 y.o.  female  with a history of bilateral breast cancer.  She presents for preoperative evaluation for upcoming procedure, bilateral breast reconstruction with placement of tissue expander and Flex HD/ADM, scheduled for 07/01/2022 with Dr.  Marla Roe  Patient presents today with her aunt to discuss surgical planning and any preoperative questions they have.  The patient has not had problems with anesthesia.  She reports a history of back surgery without complications. No history of DVT/PE.  No family history of DVT/PE.  No family or personal history of bleeding or clotting disorders.  Patient is not currently taking any blood thinners.  No history of CVA/MI.   Summary of Previous Visit: 62 year old female with history of having a lesion removed from her breast many years ago which was benign.  No history of radiation.  She then had 10 years without a mammogram, sister recently diagnosed with breast cancer.  Patient subsequently had mammogram and was found to have bilateral breast cancer.  Her breast size is a C/D, she is not a smoker but she does have diabetes.  Patient is high risk for complications which was discussed at her consultation with Dr. Marla Roe.  PMH Significant for: GERD, hypertension, hyperlipidemia, bilateral breast cancer. Diabetes mellitus, most recent A1c 1 year ago on 07/14/2020 which was 6.7.  Patient is on chronic extended release oxycodone 36 mg, reports she takes this for back pain She is also on Lyrica  and Xanax.  Patient is on GLP-1 which she takes weekly, she is aware to hold this prior to surgery for 1 week.  Provided her with exact date to hold  Chemotherapy/radiation: She may require adjuvant chemotherapy if pathology confirms that she has triple negative breast cancer.  May need postmastectomy radiation.  Patient denies any recent changes to her health other than her cancer diagnosis.  Denies any cardiac or pulmonary symptoms or issues.   Past Medical History: Allergies: Allergies  Allergen Reactions   Codeine Phosphate     REACTION: unspecified   Simvastatin     REACTION: muscle' \\T'$ \ joint pains   Sulfamethoxazole     REACTION: unspecified    Current Medications:  Current Outpatient Medications:    albuterol (VENTOLIN HFA) 108 (90 Base) MCG/ACT inhaler, Inhale 2 puffs into the lungs every 6 (six) hours as needed for wheezing or shortness of breath., Disp: 6.7 g, Rfl: 0   ALPRAZolam (XANAX) 0.25 MG tablet, Take 0.25 mg by mouth 2 (two) times daily as needed for anxiety., Disp: , Rfl: 2   amitriptyline (ELAVIL) 50 MG tablet, Take 50 mg by mouth at bedtime as needed., Disp: , Rfl:    amLODipine (NORVASC) 10 MG tablet, Take 10 mg by mouth daily., Disp: , Rfl:    cephALEXin (KEFLEX) 500 MG capsule, Take 1 capsule (500 mg total) by mouth 4 (four) times daily for 5 days., Disp: 20 capsule, Rfl: 0   ezetimibe (ZETIA) 10  MG tablet, Take 10 mg by mouth daily., Disp: , Rfl:    lidocaine (LIDODERM) 5 %, Place 3 patches onto the skin daily. Remove & Discard patch within 12 hours or as directed by MD (Patient taking differently: Place 3 patches onto the skin daily as needed (pain).), Disp: 90 patch, Rfl: 11   loratadine (CLARITIN) 10 MG tablet, Take 10 mg by mouth daily. For allergies, Disp: , Rfl:    metFORMIN (GLUCOPHAGE-XR) 500 MG 24 hr tablet, Take 500 mg by mouth 2 (two) times daily., Disp: , Rfl:    MOUNJARO 7.5 MG/0.5ML Pen, SMARTSIG:7.5 Milligram(s) SUB-Q Every 4 Weeks, Disp: ,  Rfl:    olmesartan (BENICAR) 20 MG tablet, Take 20 mg by mouth daily., Disp: , Rfl:    ondansetron (ZOFRAN) 4 MG tablet, Take 1 tablet (4 mg total) by mouth every 8 (eight) hours as needed for nausea or vomiting., Disp: 20 tablet, Rfl: 0   oxyCODONE ER (XTAMPZA ER) 36 MG C12A, Take 36 mg by mouth in the morning and at bedtime., Disp: , Rfl:    OZEMPIC, 0.25 OR 0.5 MG/DOSE, 2 MG/1.5ML SOPN, Inject 2 mg into the skin once a week. Saturday, Disp: , Rfl:    pantoprazole (PROTONIX) 40 MG tablet, Take 40 mg by mouth daily., Disp: , Rfl:    predniSONE (DELTASONE) 10 MG tablet, Take 40 mg daily for 2 days, 30 mg daily for 2 days, 20 mg daily for 2 days,10 mg daily for 1 days, then stop, Disp: 19 tablet, Rfl: 0   pregabalin (LYRICA) 100 MG capsule, Take 100 mg by mouth 2 (two) times daily., Disp: , Rfl:    dexlansoprazole (DEXILANT) 60 MG capsule, Take 60 mg by mouth daily. (Patient not taking: Reported on 06/18/2022), Disp: , Rfl:   Past Medical Problems: Past Medical History:  Diagnosis Date   Arthritis    Bladder spasms    Breast cancer ([place])    GERD (gastroesophageal reflux disease)    Hyperlipidemia    Hypertension     Past Surgical History: Past Surgical History:  Procedure Laterality Date   [place], 2006   BREAST BIOPSY Left 05/14/2022   [place] LT BREAST BX W LOC DEV 1ST LESION IMG BX SPEC US GUIDE 05/14/2022 [facility]   BREAST BIOPSY Right 05/14/2022   MM RT BREAST BX W LOC DEV 1ST LESION IMAGE BX SPEC STEREO GUIDE 05/14/2022 [facility]   BREAST BIOPSY Left 05/14/2022   MM LT BREAST BX W LOC DEV 1ST LESION IMAGE BX SPEC STEREO GUIDE 05/14/2022 [facility]   BREAST BIOPSY Right 05/26/2022   MM RT BREAST BX W LOC DEV 1ST LESION IMAGE BX SPEC STEREO GUIDE 05/26/2022 [facility]   BREAST BIOPSY Right 05/26/2022   MM RT BREAST BX W LOC DEV EA AD LESION IMG BX SPEC STEREO GUIDE 05/26/2022 [facility]   BREAST LUMPECTOMY  1976   bi-lat , both  benign    CHOLECYSTECTOMY  1998   LEG SURGERY  2009   work related injury   PARTIAL HYSTERECTOMY      Social History: Social History   Socioeconomic History   Marital status: Married    Spouse name: Ricky    Number of children: 2   Years of education: 9   Highest education level: Not on file  Occupational History   Occupation: Pensions consultant  Tobacco Use   Smoking status: Former    Packs/day: 0.50    Years: 37.00  Total pack years: 18.50    Types: Cigarettes   Smokeless tobacco: Never  Substance and Sexual Activity   Alcohol use: No   Drug use: No   Sexual activity: Never    Comment: 62 YEARS OLD, NO MORE THAN 5 PARTNERS  Other Topics Concern   Not on file  Social History Narrative   Lives at home with husband   Caffeine use: none   Social Determinants of Radio broadcast assistant Strain: Not on file  Food Insecurity: Not on file  Transportation Needs: Not on file  Physical Activity: Not on file  Stress: Not on file  Social Connections: Not on file  Intimate Partner Violence: Not on file    Family History: Family History  Problem Relation Age of Onset   Diabetes Mother    Hypertension Mother    Heart disease Mother    Hypertension Father    Diabetes Father    Heart disease Father    Breast cancer Sister 49   Lymphoma Paternal Grandfather        dx after 50   Neuropathy Neg Hx     Review of Systems: Review of Systems  Constitutional:  Negative for chills and fever.  Respiratory: Negative.    Cardiovascular: Negative.   Gastrointestinal: Negative.   Skin: Negative.   Neurological: Negative.     Physical Exam: Vital Signs BP (!) 145/73 (BP Location: Right Arm, Patient Position: Sitting, Cuff Size: Normal)   Pulse (!) 117   Ht '5\' 6"'$  (1.676 m)   Wt 220 lb 6.4 oz (100 kg)   LMP 04/22/1997   SpO2 90%   BMI 35.57 kg/m   Physical Exam  Constitutional:      General: Not in acute distress.    Appearance: Normal appearance. Not  ill-appearing.  HENT:     Head: Normocephalic and atraumatic.  Eyes:     Pupils: Pupils are equal, round Neck:     Musculoskeletal: Normal range of motion.  Cardiovascular:     Rate and Rhythm: Normal rate    Pulses: Normal pulses.  Pulmonary:     Effort: Pulmonary effort is normal. No respiratory distress.  Musculoskeletal: Normal range of motion.  Skin:    General: Skin is warm and dry.     Findings: No erythema or rash.  Neurological:     General: No focal deficit present.     Mental Status: Alert and oriented to person, place, and time. Mental status is at baseline.     Motor: No weakness.  Psychiatric:        Mood and Affect: Mood normal.        Behavior: Behavior normal.    Assessment/Plan: The patient is scheduled for bilateral breast reconstruction placement tissue expanders and Flex HD with Dr. Marla Roe.  Risks, benefits, and alternatives of procedure discussed, questions answered and consent obtained.    Smoking Status: Reports quitting many years ago; Counseling Given?  N/A Last Mammogram: 04/28/2022 with subsequent clip placement and biopsies  Caprini Score: 7, high; Risk Factors include: Age, BMI greater than 25, breast cancer, and length of planned surgery. Recommendation for mechanical and possible pharmacological prophylaxis. Encourage early ambulation.   Pictures obtained: '@consult'$   Post-op Rx sent to pharmacy:  Zofran, Keflex.  No narcotics will be prescribed as patient is on chronic oxycodone 36 mg extended release.  She is comfortable with this and reports she does not feel as if she needs any additional pain medication.  Patient was provided  with the breast reconstruction and General Surgical Risk consent document and Pain Medication Agreement prior to their appointment.  They had adequate time to read through the risk consent documents and Pain Medication Agreement. We also discussed them in person together during this preop appointment. All of their  questions were answered to their satisfaction.  Recommended calling if they have any further questions.  Risk consent form and Pain Medication Agreement to be scanned into patient's chart.  The risks that can be encountered with and after placement of a breast expander placement were discussed and include the following but not limited to these: bleeding, infection, delayed healing, anesthesia risks, skin sensation changes, injury to structures including nerves, blood vessels, and muscles which may be temporary or permanent, allergies to tape, suture materials and glues, blood products, topical preparations or injected agents, skin contour irregularities, skin discoloration and swelling, deep vein thrombosis, cardiac and pulmonary complications, pain, which may persist, fluid accumulation, wrinkling of the skin over the expander, changes in nipple or breast sensation, expander leakage or rupture, faulty position of the expander, persistent pain, formation of tight scar tissue around the expander (capsular contracture), possible need for revisional surgery or staged procedures.  We discussed specific complications related to expanders include infection, bleeding, skin necrosis resulting in the need for expander removal or replacement or additional surgeries.  Patient was understanding of the risks and and all of her questions and her family member's questions were answered to their content.  We discussed no eating or drinking after midnight prior to surgery.   Electronically signed by: Carola Rhine Layten Aiken, PA-C 06/18/2022 9:39 AM

## 2022-06-18 ENCOUNTER — Inpatient Hospital Stay (HOSPITAL_COMMUNITY)
Admission: EM | Admit: 2022-06-18 | Discharge: 2022-06-21 | DRG: 871 | Disposition: A | Discharge status: Home / Self Care | Payer: BC Managed Care – PPO | Attending: Internal Medicine | Admitting: Internal Medicine

## 2022-06-18 ENCOUNTER — Encounter (HOSPITAL_COMMUNITY): Payer: Self-pay | Admitting: Emergency Medicine

## 2022-06-18 ENCOUNTER — Telehealth: Payer: Self-pay

## 2022-06-18 ENCOUNTER — Emergency Department (HOSPITAL_COMMUNITY): Payer: BC Managed Care – PPO

## 2022-06-18 ENCOUNTER — Ambulatory Visit (INDEPENDENT_AMBULATORY_CARE_PROVIDER_SITE_OTHER): Payer: BC Managed Care – PPO | Admitting: Surgical

## 2022-06-18 ENCOUNTER — Encounter: Payer: Self-pay | Admitting: Surgical

## 2022-06-18 ENCOUNTER — Other Ambulatory Visit: Payer: Self-pay

## 2022-06-18 ENCOUNTER — Other Ambulatory Visit (HOSPITAL_COMMUNITY): Payer: BC Managed Care – PPO

## 2022-06-18 VITALS — BP 145/73 | HR 117 | Ht 66.0 in | Wt 220.4 lb

## 2022-06-18 DIAGNOSIS — Z1152 Encounter for screening for COVID-19: Secondary | ICD-10-CM | POA: Diagnosis not present

## 2022-06-18 DIAGNOSIS — I10 Essential (primary) hypertension: Secondary | ICD-10-CM | POA: Diagnosis present

## 2022-06-18 DIAGNOSIS — Z9049 Acquired absence of other specified parts of digestive tract: Secondary | ICD-10-CM

## 2022-06-18 DIAGNOSIS — R4182 Altered mental status, unspecified: Secondary | ICD-10-CM | POA: Diagnosis not present

## 2022-06-18 DIAGNOSIS — R6520 Severe sepsis without septic shock: Secondary | ICD-10-CM | POA: Insufficient documentation

## 2022-06-18 DIAGNOSIS — E1169 Type 2 diabetes mellitus with other specified complication: Secondary | ICD-10-CM

## 2022-06-18 DIAGNOSIS — N10 Acute tubulo-interstitial nephritis: Secondary | ICD-10-CM | POA: Insufficient documentation

## 2022-06-18 DIAGNOSIS — R000 Tachycardia, unspecified: Secondary | ICD-10-CM | POA: Diagnosis not present

## 2022-06-18 DIAGNOSIS — R600 Localized edema: Secondary | ICD-10-CM | POA: Diagnosis not present

## 2022-06-18 DIAGNOSIS — G894 Chronic pain syndrome: Secondary | ICD-10-CM | POA: Diagnosis present

## 2022-06-18 DIAGNOSIS — K219 Gastro-esophageal reflux disease without esophagitis: Secondary | ICD-10-CM | POA: Diagnosis present

## 2022-06-18 DIAGNOSIS — J129 Viral pneumonia, unspecified: Secondary | ICD-10-CM | POA: Diagnosis not present

## 2022-06-18 DIAGNOSIS — R410 Disorientation, unspecified: Secondary | ICD-10-CM | POA: Diagnosis not present

## 2022-06-18 DIAGNOSIS — R918 Other nonspecific abnormal finding of lung field: Secondary | ICD-10-CM | POA: Diagnosis not present

## 2022-06-18 DIAGNOSIS — Z807 Family history of other malignant neoplasms of lymphoid, hematopoietic and related tissues: Secondary | ICD-10-CM

## 2022-06-18 DIAGNOSIS — F419 Anxiety disorder, unspecified: Secondary | ICD-10-CM | POA: Diagnosis present

## 2022-06-18 DIAGNOSIS — M25572 Pain in left ankle and joints of left foot: Secondary | ICD-10-CM | POA: Diagnosis not present

## 2022-06-18 DIAGNOSIS — Z7984 Long term (current) use of oral hypoglycemic drugs: Secondary | ICD-10-CM

## 2022-06-18 DIAGNOSIS — Z171 Estrogen receptor negative status [ER-]: Secondary | ICD-10-CM

## 2022-06-18 DIAGNOSIS — C50911 Malignant neoplasm of unspecified site of right female breast: Secondary | ICD-10-CM | POA: Diagnosis not present

## 2022-06-18 DIAGNOSIS — Z170 Estrogen receptor positive status [ER+]: Secondary | ICD-10-CM

## 2022-06-18 DIAGNOSIS — Z833 Family history of diabetes mellitus: Secondary | ICD-10-CM

## 2022-06-18 DIAGNOSIS — N136 Pyonephrosis: Secondary | ICD-10-CM | POA: Diagnosis not present

## 2022-06-18 DIAGNOSIS — G9341 Metabolic encephalopathy: Secondary | ICD-10-CM | POA: Diagnosis present

## 2022-06-18 DIAGNOSIS — J189 Pneumonia, unspecified organism: Secondary | ICD-10-CM | POA: Diagnosis not present

## 2022-06-18 DIAGNOSIS — N179 Acute kidney failure, unspecified: Secondary | ICD-10-CM | POA: Diagnosis present

## 2022-06-18 DIAGNOSIS — G4733 Obstructive sleep apnea (adult) (pediatric): Secondary | ICD-10-CM | POA: Diagnosis present

## 2022-06-18 DIAGNOSIS — J9601 Acute respiratory failure with hypoxia: Secondary | ICD-10-CM | POA: Diagnosis not present

## 2022-06-18 DIAGNOSIS — Z8616 Personal history of COVID-19: Secondary | ICD-10-CM

## 2022-06-18 DIAGNOSIS — Z6835 Body mass index (BMI) 35.0-35.9, adult: Secondary | ICD-10-CM

## 2022-06-18 DIAGNOSIS — N3289 Other specified disorders of bladder: Secondary | ICD-10-CM | POA: Diagnosis not present

## 2022-06-18 DIAGNOSIS — Z87891 Personal history of nicotine dependence: Secondary | ICD-10-CM | POA: Diagnosis not present

## 2022-06-18 DIAGNOSIS — E785 Hyperlipidemia, unspecified: Secondary | ICD-10-CM | POA: Diagnosis present

## 2022-06-18 DIAGNOSIS — A419 Sepsis, unspecified organism: Secondary | ICD-10-CM | POA: Diagnosis not present

## 2022-06-18 DIAGNOSIS — Z90711 Acquired absence of uterus with remaining cervical stump: Secondary | ICD-10-CM

## 2022-06-18 DIAGNOSIS — K760 Fatty (change of) liver, not elsewhere classified: Secondary | ICD-10-CM | POA: Diagnosis not present

## 2022-06-18 DIAGNOSIS — J9600 Acute respiratory failure, unspecified whether with hypoxia or hypercapnia: Secondary | ICD-10-CM | POA: Diagnosis present

## 2022-06-18 DIAGNOSIS — E860 Dehydration: Secondary | ICD-10-CM | POA: Diagnosis present

## 2022-06-18 DIAGNOSIS — C50912 Malignant neoplasm of unspecified site of left female breast: Secondary | ICD-10-CM | POA: Diagnosis not present

## 2022-06-18 DIAGNOSIS — R911 Solitary pulmonary nodule: Secondary | ICD-10-CM | POA: Diagnosis present

## 2022-06-18 DIAGNOSIS — E119 Type 2 diabetes mellitus without complications: Secondary | ICD-10-CM | POA: Diagnosis present

## 2022-06-18 DIAGNOSIS — M25451 Effusion, right hip: Secondary | ICD-10-CM | POA: Diagnosis not present

## 2022-06-18 DIAGNOSIS — Z8249 Family history of ischemic heart disease and other diseases of the circulatory system: Secondary | ICD-10-CM

## 2022-06-18 DIAGNOSIS — C50411 Malignant neoplasm of upper-outer quadrant of right female breast: Secondary | ICD-10-CM

## 2022-06-18 DIAGNOSIS — Z79899 Other long term (current) drug therapy: Secondary | ICD-10-CM

## 2022-06-18 DIAGNOSIS — E669 Obesity, unspecified: Secondary | ICD-10-CM | POA: Diagnosis not present

## 2022-06-18 DIAGNOSIS — C50812 Malignant neoplasm of overlapping sites of left female breast: Secondary | ICD-10-CM

## 2022-06-18 DIAGNOSIS — Z803 Family history of malignant neoplasm of breast: Secondary | ICD-10-CM | POA: Diagnosis not present

## 2022-06-18 DIAGNOSIS — M25551 Pain in right hip: Secondary | ICD-10-CM | POA: Diagnosis not present

## 2022-06-18 DIAGNOSIS — N1330 Unspecified hydronephrosis: Secondary | ICD-10-CM | POA: Diagnosis not present

## 2022-06-18 DIAGNOSIS — M1611 Unilateral primary osteoarthritis, right hip: Secondary | ICD-10-CM | POA: Diagnosis not present

## 2022-06-18 LAB — PROTIME-INR
INR: 1.1 (ref 0.8–1.2)
Prothrombin Time: 14.2 seconds (ref 11.4–15.2)

## 2022-06-18 LAB — COMPREHENSIVE METABOLIC PANEL
ALT: 51 U/L — ABNORMAL HIGH (ref 0–44)
AST: 91 U/L — ABNORMAL HIGH (ref 15–41)
Albumin: 3.5 g/dL (ref 3.5–5.0)
Alkaline Phosphatase: 52 U/L (ref 38–126)
Anion gap: 11 (ref 5–15)
BUN: 40 mg/dL — ABNORMAL HIGH (ref 8–23)
CO2: 22 mmol/L (ref 22–32)
Calcium: 8.8 mg/dL — ABNORMAL LOW (ref 8.9–10.3)
Chloride: 102 mmol/L (ref 98–111)
Creatinine, Ser: 2.3 mg/dL — ABNORMAL HIGH (ref 0.44–1.00)
GFR, Estimated: 23 mL/min — ABNORMAL LOW (ref >60)
Glucose, Bld: 187 mg/dL — ABNORMAL HIGH (ref 70–99)
Potassium: 5.1 mmol/L (ref 3.5–5.1)
Sodium: 135 mmol/L (ref 135–145)
Total Bilirubin: 0.5 mg/dL (ref 0.3–1.2)
Total Protein: 6.8 g/dL (ref 6.5–8.1)

## 2022-06-18 LAB — I-STAT VENOUS BLOOD GAS, ED
Acid-base deficit: 2 mmol/L (ref 0.0–2.0)
Bicarbonate: 23.4 mmol/L (ref 20.0–28.0)
Calcium, Ion: 1.16 mmol/L (ref 1.15–1.40)
HCT: 36 % (ref 36.0–46.0)
Hemoglobin: 12.2 g/dL (ref 12.0–15.0)
O2 Saturation: 87 %
Potassium: 5.2 mmol/L — ABNORMAL HIGH (ref 3.5–5.1)
Sodium: 136 mmol/L (ref 135–145)
TCO2: 25 mmol/L (ref 22–32)
pCO2, Ven: 42.3 mmHg — ABNORMAL LOW (ref 44–60)
pH, Ven: 7.35 (ref 7.25–7.43)
pO2, Ven: 56 mmHg — ABNORMAL HIGH (ref 32–45)

## 2022-06-18 LAB — CBC WITH DIFFERENTIAL/PLATELET
Abs Immature Granulocytes: 0.03 10*3/uL (ref 0.00–0.07)
Basophils Absolute: 0 10*3/uL (ref 0.0–0.1)
Basophils Relative: 0 %
Eosinophils Absolute: 0 10*3/uL (ref 0.0–0.5)
Eosinophils Relative: 0 %
HCT: 37.5 % (ref 36.0–46.0)
Hemoglobin: 11.7 g/dL — ABNORMAL LOW (ref 12.0–15.0)
Immature Granulocytes: 0 %
Lymphocytes Relative: 5 %
Lymphs Abs: 0.5 10*3/uL — ABNORMAL LOW (ref 0.7–4.0)
MCH: 29.5 pg (ref 26.0–34.0)
MCHC: 31.2 g/dL (ref 30.0–36.0)
MCV: 94.5 fL (ref 80.0–100.0)
Monocytes Absolute: 0.7 10*3/uL (ref 0.1–1.0)
Monocytes Relative: 6 %
Neutro Abs: 9.6 10*3/uL — ABNORMAL HIGH (ref 1.7–7.7)
Neutrophils Relative %: 89 %
Platelets: 237 10*3/uL (ref 150–400)
RBC: 3.97 MIL/uL (ref 3.87–5.11)
RDW: 16.4 % — ABNORMAL HIGH (ref 11.5–15.5)
WBC: 10.8 10*3/uL — ABNORMAL HIGH (ref 4.0–10.5)
nRBC: 0 % (ref 0.0–0.2)

## 2022-06-18 LAB — RESP PANEL BY RT-PCR (RSV, FLU A&B, COVID)  RVPGX2
Influenza A by PCR: NEGATIVE
Influenza B by PCR: NEGATIVE
Resp Syncytial Virus by PCR: NEGATIVE
SARS Coronavirus 2 by RT PCR: NEGATIVE

## 2022-06-18 LAB — LACTIC ACID, PLASMA: Lactic Acid, Venous: 1.1 mmol/L (ref 0.5–1.9)

## 2022-06-18 LAB — APTT: aPTT: 27 seconds (ref 24–36)

## 2022-06-18 MED ORDER — CEPHALEXIN 500 MG PO CAPS: 500.0000 mg | ORAL_CAPSULE | Freq: Four times a day (QID) | ORAL | 0 refills | Status: DC

## 2022-06-18 MED ORDER — ONDANSETRON HCL 4 MG PO TABS: 4.0000 mg | ORAL_TABLET | Freq: Three times a day (TID) | ORAL | 0 refills | Status: DC | PRN

## 2022-06-18 MED ORDER — KETOROLAC TROMETHAMINE 15 MG/ML IJ SOLN
15.0000 mg | Freq: Once | INTRAMUSCULAR | Status: AC
  Administered 2022-06-18: 15 mg via INTRAVENOUS
  Filled 2022-06-18: qty 1

## 2022-06-18 MED ORDER — SODIUM CHLORIDE 0.9 % IV SOLN
2.0000 g | Freq: Once | INTRAVENOUS | Status: AC
  Administered 2022-06-18: 2 g via INTRAVENOUS
  Filled 2022-06-18: qty 20

## 2022-06-18 MED ORDER — ACETAMINOPHEN 500 MG PO TABS
1000.0000 mg | ORAL_TABLET | Freq: Once | ORAL | Status: AC
  Administered 2022-06-18: 1000 mg via ORAL
  Filled 2022-06-18: qty 2

## 2022-06-18 MED ORDER — SODIUM CHLORIDE 0.9 % IV SOLN
500.0000 mg | Freq: Once | INTRAVENOUS | Status: AC
  Administered 2022-06-18: 500 mg via INTRAVENOUS
  Filled 2022-06-18: qty 5

## 2022-06-18 MED ORDER — SODIUM CHLORIDE 0.9 % IV BOLUS
1000.0000 mL | Freq: Once | INTRAVENOUS | Status: AC
  Administered 2022-06-18: 1000 mL via INTRAVENOUS

## 2022-06-18 NOTE — ED Provider Notes (Signed)
[facility] EMERGENCY DEPARTMENT Provider Note   CSN: [id number] Arrival date & time: 06/18/22  2050     History  Chief Complaint  Patient presents with   Altered Mental Status    [name] is a 62 y.o. female.  62 yo F with a chief complaints of altered mental status.  Reportedly was normal this morning when her family member went off to work.  Was found to be quite confused.  Similar to when she was diagnosed with COVID in the past.  Required admission at that time.  She had recently been diagnosed with breast cancer bilaterally.  Has a scheduled mastectomy later this month.  She has been taking Mucinex because she has not been feeling well but denies any obvious cough or congestion denies nausea vomiting or diarrhea.  Denies abdominal pain.  Has been complaining of pain to the right hip.  She has trouble localizing this.   Altered Mental Status      Home Medications Prior to Admission medications   Medication Sig Start Date End Date Taking? Authorizing Provider  albuterol (VENTOLIN HFA) 108 (90 Base) MCG/ACT inhaler Inhale 2 puffs into the lungs every 6 (six) hours as needed for wheezing or shortness of breath. 07/16/20   Ghimire, Henreitta Leber, MD  ALPRAZolam Duanne Moron) 0.25 MG tablet Take 0.25 mg by mouth 2 (two) times daily as needed for anxiety. 02/20/15   [provider]  amitriptyline (ELAVIL) 50 MG tablet Take 50 mg by mouth at bedtime as needed.    [provider]  amLODipine (NORVASC) 10 MG tablet Take 10 mg by mouth daily. 05/05/20   [provider]  cephALEXin (KEFLEX) 500 MG capsule Take 1 capsule (500 mg total) by mouth 4 (four) times daily for 5 days. 06/18/22 06/23/22  Scheeler, Carola Rhine, PA-C  dexlansoprazole (DEXILANT) 60 MG capsule Take 60 mg by mouth daily. Patient not taking: Reported on 06/18/2022    [provider]  ezetimibe (ZETIA) 10 MG tablet Take 10 mg by mouth daily.    [provider]  lidocaine  (LIDODERM) 5 % Place 3 patches onto the skin daily. Remove & Discard patch within 12 hours or as directed by MD Patient taking differently: Place 3 patches onto the skin daily as needed (pain). 04/23/15   Melvenia Beam, MD  loratadine (CLARITIN) 10 MG tablet Take 10 mg by mouth daily. For allergies    [provider]  metFORMIN (GLUCOPHAGE-XR) 500 MG 24 hr tablet Take 500 mg by mouth 2 (two) times daily. 06/09/20   [provider]  MOUNJARO 7.5 MG/0.5ML Pen SMARTSIG:7.5 Milligram(s) SUB-Q Every 4 Weeks 04/29/22   [provider]  olmesartan (BENICAR) 20 MG tablet Take 20 mg by mouth daily. 04/08/22   [provider]  ondansetron (ZOFRAN) 4 MG tablet Take 1 tablet (4 mg total) by mouth every 8 (eight) hours as needed for nausea or vomiting. 06/18/22   Scheeler, Carola Rhine, PA-C  oxyCODONE ER [facility]) 36 MG C12A Take 36 mg by mouth in the morning and at bedtime.    [provider]  OZEMPIC, 0.25 OR 0.5 MG/DOSE, 2 MG/1.5ML SOPN Inject 2 mg into the skin once a week. Saturday 06/23/20   [provider]  pantoprazole (PROTONIX) 40 MG tablet Take 40 mg by mouth daily.    [provider]  predniSONE (DELTASONE) 10 MG tablet Take 40 mg daily for 2 days, 30 mg daily for 2 days, 20 mg daily  for 2 days,10 mg daily for 1 days, then stop 07/16/20   Jonetta Osgood, MD  pregabalin (LYRICA) 100 MG capsule Take 100 mg by mouth 2 (two) times daily.    [provider]      Allergies    Codeine phosphate, Simvastatin, and Sulfamethoxazole    Review of Systems   Review of Systems  Physical Exam Updated Vital Signs BP 112/73   Pulse (!) 107   Temp 99.7 F (37.6 C) (Oral)   Resp (!) 21   Ht '5\' 6"'$  (1.676 m)   Wt 100 kg   LMP 04/22/1997   SpO2 95%   BMI 35.57 kg/m  Physical Exam Vitals and nursing note reviewed.  Constitutional:      General: She is not in acute distress.    Appearance: She is well-developed. She is not  diaphoretic.     Comments: The patient is able to talk with me but has confused response.  HENT:     Head: Normocephalic and atraumatic.  Eyes:     Pupils: Pupils are equal, round, and reactive to light.  Cardiovascular:     Rate and Rhythm: Regular rhythm. Tachycardia present.     Heart sounds: No murmur heard.    No friction rub. No gallop.  Pulmonary:     Effort: Pulmonary effort is normal.     Breath sounds: No wheezing or rales.  Abdominal:     General: There is no distension.     Palpations: Abdomen is soft.     Tenderness: There is no abdominal tenderness.  Musculoskeletal:        General: No tenderness.     Cervical back: Normal range of motion and neck supple.  Skin:    General: Skin is warm and dry.     Comments: Skin is hot to touch.  Neurological:     Mental Status: She is alert and oriented to person, place, and time.  Psychiatric:        Behavior: Behavior normal.     ED Results / Procedures / Treatments   Labs (all labs ordered are listed, but only abnormal results are displayed) Labs Reviewed  COMPREHENSIVE METABOLIC PANEL - Abnormal; Notable for the following components:      Result Value   Glucose, Bld 187 (*)    BUN 40 (*)    Creatinine, Ser 2.30 (*)    Calcium 8.8 (*)    AST 91 (*)    ALT 51 (*)    GFR, Estimated 23 (*)    All other components within normal limits  CBC WITH DIFFERENTIAL/PLATELET - Abnormal; Notable for the following components:   WBC 10.8 (*)    Hemoglobin 11.7 (*)    RDW 16.4 (*)    Neutro Abs 9.6 (*)    Lymphs Abs 0.5 (*)    All other components within normal limits  I-STAT VENOUS BLOOD GAS, ED - Abnormal; Notable for the following components:   pCO2, Ven 42.3 (*)    pO2, Ven 56 (*)    Potassium 5.2 (*)    All other components within normal limits  RESP PANEL BY RT-PCR (RSV, FLU A&B, COVID)  RVPGX2  CULTURE, BLOOD (ROUTINE X 2)  CULTURE, BLOOD (ROUTINE X 2)  URINE CULTURE  LACTIC ACID, PLASMA  PROTIME-INR  APTT   LACTIC ACID, PLASMA  URINALYSIS, ROUTINE W REFLEX MICROSCOPIC    EKG EKG Interpretation  Date/Time:  Friday June 18 2022 21:02:33 EST Ventricular Rate:  115 PR  Interval:  151 QRS Duration: 138 QT Interval:  341 QTC Calculation: 472 R Axis:   61 Text Interpretation: Sinus tachycardia Right bundle branch block Otherwise no significant change Confirmed by Deno Etienne [phone]) on 06/18/2022 9:36:56 PM  Radiology DG Hip Unilat W or Wo Pelvis 2-3 Views Right  Result Date: 06/18/2022 CLINICAL DATA:  Hip pain EXAM: DG HIP (WITH OR WITHOUT PELVIS) 2-3V RIGHT COMPARISON:  None Available. FINDINGS: There is no evidence of hip fracture or dislocation. There is no evidence of arthropathy or other focal bone abnormality. Lumbar fusion hardware is partially visualized. IMPRESSION: No acute fracture or dislocation. Electronically Signed   By: Ronney Asters M.D.   On: 06/18/2022 22:50   DG Chest Port 1 View  Result Date: 06/18/2022 CLINICAL DATA:  Sepsis, altered level of consciousness EXAM: PORTABLE CHEST 1 VIEW COMPARISON:  07/13/2020 FINDINGS: Single frontal view of the chest demonstrates a stable cardiac silhouette. Increased interstitial prominence greatest at the lung bases, with scattered areas of ground-glass airspace disease. No effusion or pneumothorax. No acute bony abnormality. IMPRESSION: 1. Interstitial prominence and basilar ground-glass airspace disease, which could reflect sequela of atypical viral pneumonia or edema. Electronically Signed   By: Randa Ngo M.D.   On: 06/18/2022 21:38    Procedures Procedures    Medications Ordered in ED Medications  sodium chloride 0.9 % bolus 1,000 mL (1,000 mLs Intravenous New Bag/Given 06/18/22 2157)  acetaminophen (TYLENOL) tablet 1,000 mg (1,000 mg Oral Given 06/18/22 2156)  cefTRIAXone (ROCEPHIN) 2 g in sodium chloride 0.9 % 100 mL IVPB (0 g Intravenous Stopped 06/18/22 2243)  azithromycin (ZITHROMAX) 500 mg in sodium chloride 0.9 % 250 mL IVPB  (500 mg Intravenous New Bag/Given 06/18/22 2245)  ketorolac (TORADOL) 15 MG/ML injection 15 mg (15 mg Intravenous Given 06/18/22 2244)    ED Course/ Medical Decision Making/ A&P                           Medical Decision Making Amount and/or Complexity of Data Reviewed Labs: ordered. Radiology: ordered. ECG/medicine tests: ordered.  Risk OTC drugs. Prescription drug management.   62 yo F with a chief complaint of altered mental status.  This started just today.  Reportedly had felt unwell the past couple days.  No known sick contacts.  Had similar experience when she was diagnosed with COVID about a year or so ago.  Will obtain a workup for possible infection as she had a temperature to 102.4 here.  Bolus of IV fluids.  Reassess.  Leukocytosis.  Patient with what looks like right lower lobe pneumonia on chest x-ray on my independent interpretation.  Radiology read with concern for viral process.  Will cover with antibiotics.  Patient continues to complain of right-sided flank pain, this could be due to the infiltrated that I am seeing on x-ray.  Plain film of the right hip ordered negative for fracture or dislocation on my independent interpretation.  Lactate is normal.  Will obtain a CT stone study.  Will discuss with medicine for admission.  The patients results and plan were reviewed and discussed.   Any x-rays performed were independently reviewed by myself.   Differential diagnosis were considered with the presenting HPI.  Medications  sodium chloride 0.9 % bolus 1,000 mL (1,000 mLs Intravenous New Bag/Given 06/18/22 2157)  acetaminophen (TYLENOL) tablet 1,000 mg (1,000 mg Oral Given 06/18/22 2156)  cefTRIAXone (ROCEPHIN) 2 g in sodium chloride 0.9 % 100 mL IVPB (0  g Intravenous Stopped 06/18/22 2243)  azithromycin (ZITHROMAX) 500 mg in sodium chloride 0.9 % 250 mL IVPB (500 mg Intravenous New Bag/Given 06/18/22 2245)  ketorolac (TORADOL) 15 MG/ML injection 15 mg (15 mg Intravenous Given  06/18/22 2244)    Vitals:   06/18/22 2108 06/18/22 2200 06/18/22 2245 06/18/22 2246  BP:  112/84 112/73   Pulse:  (!) 111 (!) 107   Resp:  (!) 22 (!) 21   Temp:    99.7 F (37.6 C)  TempSrc:    Oral  SpO2:  92% 95%   Weight: 100 kg     Height: '5\' 6"'$  (1.676 m)       Final diagnoses:  Community acquired pneumonia of right lower lobe of lung    Admission/ observation were discussed with the admitting physician, patient and/or family and they are comfortable with the plan.          Final Clinical Impression(s) / ED Diagnoses Final diagnoses:  Community acquired pneumonia of right lower lobe of lung    Rx / DC Orders ED Discharge Orders     None         Deno Etienne, DO 06/18/22 2353

## 2022-06-18 NOTE — ED Triage Notes (Addendum)
Pt BIB EMS from home, family states they come home to find pt altered. States that pt had an appointment at [place] with cancer doctor regarding double mastectomy scheduled for 1/18. Pt attended appointment and returned home. Pt alert to self and place. Unable to answer other questions appropriately. Oxygen 77% on RA while in room, improved to 92% 5LNC. Pt c/o R hip pain.  EMS vitals:  150/82 HR 120 97% 2LNC CBG 206

## 2022-06-18 NOTE — Telephone Encounter (Signed)
Faxed surgical clearance to Dr. Nicholaus Bloom for PO pain magement recommendations.

## 2022-06-19 ENCOUNTER — Inpatient Hospital Stay (HOSPITAL_COMMUNITY): Payer: BC Managed Care – PPO

## 2022-06-19 ENCOUNTER — Emergency Department (HOSPITAL_COMMUNITY): Payer: BC Managed Care – PPO

## 2022-06-19 DIAGNOSIS — N179 Acute kidney failure, unspecified: Secondary | ICD-10-CM | POA: Insufficient documentation

## 2022-06-19 DIAGNOSIS — N136 Pyonephrosis: Secondary | ICD-10-CM | POA: Diagnosis present

## 2022-06-19 DIAGNOSIS — J129 Viral pneumonia, unspecified: Secondary | ICD-10-CM | POA: Diagnosis present

## 2022-06-19 DIAGNOSIS — I10 Essential (primary) hypertension: Secondary | ICD-10-CM | POA: Diagnosis present

## 2022-06-19 DIAGNOSIS — F419 Anxiety disorder, unspecified: Secondary | ICD-10-CM | POA: Diagnosis present

## 2022-06-19 DIAGNOSIS — E785 Hyperlipidemia, unspecified: Secondary | ICD-10-CM | POA: Diagnosis present

## 2022-06-19 DIAGNOSIS — A419 Sepsis, unspecified organism: Secondary | ICD-10-CM | POA: Diagnosis present

## 2022-06-19 DIAGNOSIS — Z1152 Encounter for screening for COVID-19: Secondary | ICD-10-CM | POA: Diagnosis not present

## 2022-06-19 DIAGNOSIS — Z87891 Personal history of nicotine dependence: Secondary | ICD-10-CM | POA: Diagnosis not present

## 2022-06-19 DIAGNOSIS — K760 Fatty (change of) liver, not elsewhere classified: Secondary | ICD-10-CM | POA: Diagnosis present

## 2022-06-19 DIAGNOSIS — C50911 Malignant neoplasm of unspecified site of right female breast: Secondary | ICD-10-CM | POA: Diagnosis present

## 2022-06-19 DIAGNOSIS — Z803 Family history of malignant neoplasm of breast: Secondary | ICD-10-CM | POA: Diagnosis not present

## 2022-06-19 DIAGNOSIS — E860 Dehydration: Secondary | ICD-10-CM | POA: Diagnosis present

## 2022-06-19 DIAGNOSIS — J9600 Acute respiratory failure, unspecified whether with hypoxia or hypercapnia: Secondary | ICD-10-CM | POA: Diagnosis present

## 2022-06-19 DIAGNOSIS — G894 Chronic pain syndrome: Secondary | ICD-10-CM | POA: Diagnosis present

## 2022-06-19 DIAGNOSIS — C50912 Malignant neoplasm of unspecified site of left female breast: Secondary | ICD-10-CM | POA: Diagnosis present

## 2022-06-19 DIAGNOSIS — Z79899 Other long term (current) drug therapy: Secondary | ICD-10-CM | POA: Diagnosis not present

## 2022-06-19 DIAGNOSIS — G9341 Metabolic encephalopathy: Secondary | ICD-10-CM | POA: Diagnosis present

## 2022-06-19 DIAGNOSIS — J9601 Acute respiratory failure with hypoxia: Secondary | ICD-10-CM | POA: Insufficient documentation

## 2022-06-19 DIAGNOSIS — J189 Pneumonia, unspecified organism: Secondary | ICD-10-CM | POA: Diagnosis present

## 2022-06-19 DIAGNOSIS — E669 Obesity, unspecified: Secondary | ICD-10-CM | POA: Diagnosis present

## 2022-06-19 DIAGNOSIS — R4182 Altered mental status, unspecified: Secondary | ICD-10-CM | POA: Diagnosis present

## 2022-06-19 DIAGNOSIS — N10 Acute tubulo-interstitial nephritis: Secondary | ICD-10-CM | POA: Diagnosis not present

## 2022-06-19 DIAGNOSIS — Z8616 Personal history of COVID-19: Secondary | ICD-10-CM | POA: Diagnosis not present

## 2022-06-19 DIAGNOSIS — R6520 Severe sepsis without septic shock: Secondary | ICD-10-CM | POA: Diagnosis present

## 2022-06-19 DIAGNOSIS — E119 Type 2 diabetes mellitus without complications: Secondary | ICD-10-CM | POA: Diagnosis present

## 2022-06-19 DIAGNOSIS — Z7984 Long term (current) use of oral hypoglycemic drugs: Secondary | ICD-10-CM | POA: Diagnosis not present

## 2022-06-19 DIAGNOSIS — K219 Gastro-esophageal reflux disease without esophagitis: Secondary | ICD-10-CM | POA: Diagnosis present

## 2022-06-19 LAB — URINALYSIS, ROUTINE W REFLEX MICROSCOPIC
Bilirubin Urine: NEGATIVE
Glucose, UA: NEGATIVE mg/dL
Ketones, ur: NEGATIVE mg/dL
Nitrite: NEGATIVE
Protein, ur: 30 mg/dL — AB
Specific Gravity, Urine: 1.018 (ref 1.005–1.030)
pH: 5 (ref 5.0–8.0)

## 2022-06-19 LAB — COMPREHENSIVE METABOLIC PANEL
ALT: 50 U/L — ABNORMAL HIGH (ref 0–44)
AST: 87 U/L — ABNORMAL HIGH (ref 15–41)
Albumin: 3 g/dL — ABNORMAL LOW (ref 3.5–5.0)
Alkaline Phosphatase: 45 U/L (ref 38–126)
Anion gap: 11 (ref 5–15)
BUN: 40 mg/dL — ABNORMAL HIGH (ref 8–23)
CO2: 20 mmol/L — ABNORMAL LOW (ref 22–32)
Calcium: 7.7 mg/dL — ABNORMAL LOW (ref 8.9–10.3)
Chloride: 106 mmol/L (ref 98–111)
Creatinine, Ser: 1.91 mg/dL — ABNORMAL HIGH (ref 0.44–1.00)
GFR, Estimated: 29 mL/min — ABNORMAL LOW (ref >60)
Glucose, Bld: 167 mg/dL — ABNORMAL HIGH (ref 70–99)
Potassium: 5.4 mmol/L — ABNORMAL HIGH (ref 3.5–5.1)
Sodium: 137 mmol/L (ref 135–145)
Total Bilirubin: 1 mg/dL (ref 0.3–1.2)
Total Protein: 5.9 g/dL — ABNORMAL LOW (ref 6.5–8.1)

## 2022-06-19 LAB — PROCALCITONIN: Procalcitonin: 0.68 ng/mL

## 2022-06-19 LAB — RESPIRATORY PANEL BY PCR

## 2022-06-19 LAB — CBC
HCT: 35.1 % — ABNORMAL LOW (ref 36.0–46.0)
Hemoglobin: 10.3 g/dL — ABNORMAL LOW (ref 12.0–15.0)
MCH: 29.4 pg (ref 26.0–34.0)
MCHC: 29.3 g/dL — ABNORMAL LOW (ref 30.0–36.0)
MCV: 100.3 fL — ABNORMAL HIGH (ref 80.0–100.0)
Platelets: 155 10*3/uL (ref 150–400)
RBC: 3.5 MIL/uL — ABNORMAL LOW (ref 3.87–5.11)
RDW: 16.6 % — ABNORMAL HIGH (ref 11.5–15.5)
WBC: 7.1 10*3/uL (ref 4.0–10.5)
nRBC: 0 % (ref 0.0–0.2)

## 2022-06-19 LAB — HIV ANTIBODY (ROUTINE TESTING W REFLEX): HIV Screen 4th Generation wRfx: NONREACTIVE

## 2022-06-19 LAB — GLUCOSE, CAPILLARY: Glucose-Capillary: 144 mg/dL — ABNORMAL HIGH (ref 70–99)

## 2022-06-19 LAB — STREP PNEUMONIAE URINARY ANTIGEN: Strep Pneumo Urinary Antigen: NEGATIVE

## 2022-06-19 LAB — CBG MONITORING, ED
Glucose-Capillary: 170 mg/dL — ABNORMAL HIGH (ref 70–99)
Glucose-Capillary: 147 mg/dL — ABNORMAL HIGH (ref 70–99)
Glucose-Capillary: 189 mg/dL — ABNORMAL HIGH (ref 70–99)

## 2022-06-19 LAB — BRAIN NATRIURETIC PEPTIDE: B Natriuretic Peptide: 76.3 pg/mL (ref 0.0–100.0)

## 2022-06-19 MED ORDER — INSULIN ASPART 100 UNIT/ML IJ SOLN
0.0000 [IU] | Freq: Three times a day (TID) | INTRAMUSCULAR | Status: DC
  Administered 2022-06-19: 1 [IU] via SUBCUTANEOUS
  Administered 2022-06-19: 2 [IU] via SUBCUTANEOUS
  Administered 2022-06-21: 1 [IU] via SUBCUTANEOUS

## 2022-06-19 MED ORDER — ACETAMINOPHEN 325 MG PO TABS
650.0000 mg | ORAL_TABLET | Freq: Four times a day (QID) | ORAL | Status: DC | PRN
  Administered 2022-06-19: 650 mg via ORAL
  Filled 2022-06-19: qty 2

## 2022-06-19 MED ORDER — ACETAMINOPHEN 650 MG RE SUPP: 650.0000 mg | Freq: Four times a day (QID) | RECTAL | Status: DC | PRN

## 2022-06-19 MED ORDER — LIDOCAINE 5 % EX PTCH
1.0000 | MEDICATED_PATCH | CUTANEOUS | Status: DC
  Administered 2022-06-19 – 2022-06-21 (×3): 1 via TRANSDERMAL
  Filled 2022-06-19 (×3): qty 1

## 2022-06-19 MED ORDER — PREGABALIN 100 MG PO CAPS
100.0000 mg | ORAL_CAPSULE | Freq: Two times a day (BID) | ORAL | Status: DC
  Administered 2022-06-19 – 2022-06-21 (×5): 100 mg via ORAL
  Filled 2022-06-19 (×5): qty 1

## 2022-06-19 MED ORDER — SODIUM CHLORIDE 0.9 % IV SOLN
2.0000 g | INTRAVENOUS | Status: DC
  Administered 2022-06-19 – 2022-06-20 (×2): 2 g via INTRAVENOUS
  Filled 2022-06-19 (×2): qty 20

## 2022-06-19 MED ORDER — SODIUM CHLORIDE 0.9 % IV SOLN
500.0000 mg | INTRAVENOUS | Status: DC
  Administered 2022-06-19: 500 mg via INTRAVENOUS
  Filled 2022-06-19: qty 5

## 2022-06-19 MED ORDER — OXYCODONE HCL ER 20 MG PO T12A
40.0000 mg | EXTENDED_RELEASE_TABLET | Freq: Two times a day (BID) | ORAL | Status: DC
  Administered 2022-06-19 – 2022-06-21 (×5): 40 mg via ORAL
  Filled 2022-06-19 (×4): qty 2
  Filled 2022-06-19: qty 1

## 2022-06-19 MED ORDER — FENTANYL CITRATE PF 50 MCG/ML IJ SOSY
25.0000 ug | PREFILLED_SYRINGE | Freq: Once | INTRAMUSCULAR | Status: AC
  Administered 2022-06-19: 25 ug via INTRAVENOUS
  Filled 2022-06-19: qty 1

## 2022-06-19 MED ORDER — ENOXAPARIN SODIUM 30 MG/0.3ML IJ SOSY
30.0000 mg | PREFILLED_SYRINGE | INTRAMUSCULAR | Status: DC
  Administered 2022-06-19: 30 mg via SUBCUTANEOUS
  Filled 2022-06-19: qty 0.3

## 2022-06-19 MED ORDER — INSULIN ASPART 100 UNIT/ML IJ SOLN: 0.0000 [IU] | Freq: Every day | INTRAMUSCULAR | Status: DC

## 2022-06-19 MED ORDER — ALPRAZOLAM 0.25 MG PO TABS
0.2500 mg | ORAL_TABLET | Freq: Two times a day (BID) | ORAL | Status: DC | PRN
  Administered 2022-06-19 – 2022-06-20 (×3): 0.25 mg via ORAL
  Filled 2022-06-19 (×3): qty 1

## 2022-06-19 MED ORDER — ENOXAPARIN SODIUM 40 MG/0.4ML IJ SOSY
40.0000 mg | PREFILLED_SYRINGE | INTRAMUSCULAR | Status: DC
  Administered 2022-06-20 – 2022-06-21 (×2): 40 mg via SUBCUTANEOUS
  Filled 2022-06-19 (×2): qty 0.4

## 2022-06-19 NOTE — ED Notes (Signed)
MD informed RN pain medication will be addressed during rounding.

## 2022-06-19 NOTE — Progress Notes (Addendum)
PROGRESS NOTE        PATIENT DETAILS Name: [name] Age: 62 y.o. Sex: female Date of Birth: 03/02/1961 Admit Date: 06/18/2022 Admitting Physician Evalee Mutton Kristeen Mans, MD [id number], Thayer Jew, MD  Brief Summary: Patient is a 62 y.o.  female with history of HTN, HLD, DM-2, GERD, chronic pain syndrome on narcotics-recently diagnosed with bilateral breast cancer-presented with acute metabolic encephalopathy in the setting of pyelonephritis.  Significant events: 1/5>> admit to [facility] for encephalopathy in the setting of pyelonephritis.  Febrile on initial presentation.  Significant studies: 1/5>> x-ray right hip: No fracture/dislocation 1/5>> CXR: Interstitial prominence/bibasilar groundglass airspace disease 1/6>> CT renal stone study: Right-sided perinephric inflammatory fat stranding, mild left-sided hydronephrosis/hydroureter.  8 mm noncalcified lung nodule. 1/6>> CT head: No acute intracranial abnormality  Significant microbiology data: 1/5>> COVID/influenza/RSV PCR: Negative 1/5>> respiratory virus panel: Pending 1/5>> urine culture: Pending 1/5>> blood culture: Pending  Procedures: None  Consults: None  Subjective: Completely awake/alert this morning-daughter at bedside.  Only complaint is pain in the right gluteal/right hip area.  Denies any cough.  Per patient-several days ago she had frequency of urination and started taking what sounds like Keflex for the past 2 days (had some at home from prior use)  Objective: Vitals: Blood pressure (!) 97/53, pulse 84, temperature (!) 97.4 F (36.3 C), temperature source Oral, resp. rate 20, height '5\' 6"'$  (1.676 m), weight 100 kg, last menstrual period 04/22/1997, SpO2 100 %.   Exam: Gen Exam:Alert awake-not in any distress HEENT:atraumatic, normocephalic Chest: B/L clear to auscultation anteriorly CVS:S1S2 regular Abdomen:soft non tender, non distended.Pain in the right gluteal area/right lateral gluteal  area-easily reproducible with moderate amount of palpation. Extremities:no edema Neurology: Non focal Skin: no rash  Pertinent Labs/Radiology:    Latest Ref Rng & Units 06/19/2022    7:49 AM 06/18/2022    9:54 PM 06/18/2022    9:39 PM  CBC  WBC 4.0 - 10.5 K/uL 7.1   10.8   Hemoglobin 12.0 - 15.0 g/dL 10.3  12.2  11.7   Hematocrit 36.0 - 46.0 % 35.1  36.0  37.5   Platelets 150 - 400 K/uL 155   237     Lab Results  Component Value Date   NA 137 06/19/2022   K 5.4 (H) 06/19/2022   CL 106 06/19/2022   CO2 20 (L) 06/19/2022      Assessment/Plan: Acute metabolic encephalopathy Clinically improved-awake/alert this morning Etiology likely due to pyelonephritis Neck supple-no headache-no suspicion for meningitis/encephalitis Continue to treat with antibiotics-continue supportive care  Severe sepsis Right-sided pyelonephritis UA clean-as patient already on Keflex x 2 days prior to this hospitalization. Suspect urine culture could be sterile as well Clinically improved after starting Rocephin-continue for now Follow cultures.  Right gluteal/right hip pain X-ray-CT renal stone study negative for any significant hip pathology Continue to treat as pyelonephritis Discussed with radiologist over the phone on 1/6-no fluid collection/no major abnormality seen around the right hip.  Plan is to treat pyelonephritis as above-supportive care-if continues to have hip pain-will need reimaging with an MRI in the next few days.    AKI Likely hemodynamically mediated-improving with supportive care Avoid nephrotoxic agents Repeat electrolytes tomorrow  ?  Hypoxia-?  PNA Probably has OSA-and hence was hypoxic last night Has no respiratory symptoms no cough-unclear if she actually has PNA. Getting CT chest  HTN BP  currently stable/soft Continue to hold all antihypertensives-resume when able  DM-2 CBG stable with SSI Continue to hold metformin  Chronic pain syndrome Noted encephalopathy  has resolved-resume usual narcotic regimen, resume Lyrica  8 mm lung nodule Obtain CT chest-given recent history of breast cancer.  Recent diagnosis of bilateral breast cancer Scheduled for mastectomy-bilaterally on 1/18. Follows with [name].  Obesity: Estimated body mass index is 35.57 kg/m as calculated from the following:   Height as of this encounter: '5\' 6"'$  (1.676 m).   Weight as of this encounter: 100 kg.   Additional time spent equals 35 minutes.  Code status:   Code Status: Full Code   DVT Prophylaxis: enoxaparin (LOVENOX) injection 30 mg Start: 06/19/22 0800   Family Communication: Daughter at bedside  Disposition Plan: Status is: Inpatient Remains inpatient appropriate because: Severity of illness.   Planned Discharge Destination:Home   Diet: Diet Order             Diet heart healthy/carb modified Room service appropriate? Yes; Fluid consistency: Thin  Diet effective now                     Antimicrobial agents: Anti-infectives (From admission, onward)    Start     Dose/Rate Route Frequency Ordered Stop   06/19/22 2200  azithromycin (ZITHROMAX) 500 mg in sodium chloride 0.9 % 250 mL IVPB        500 mg 250 mL/hr over 60 Minutes Intravenous Every 24 hours 06/19/22 0633     06/19/22 2200  cefTRIAXone (ROCEPHIN) 2 g in sodium chloride 0.9 % 100 mL IVPB        2 g 200 mL/hr over 30 Minutes Intravenous Every 24 hours 06/19/22 0633     06/18/22 2200  cefTRIAXone (ROCEPHIN) 2 g in sodium chloride 0.9 % 100 mL IVPB        2 g 200 mL/hr over 30 Minutes Intravenous  Once 06/18/22 2152 06/18/22 2243   06/18/22 2200  azithromycin (ZITHROMAX) 500 mg in sodium chloride 0.9 % 250 mL IVPB        500 mg 250 mL/hr over 60 Minutes Intravenous  Once 06/18/22 2152 06/19/22 0106        MEDICATIONS: Scheduled Meds:  enoxaparin (LOVENOX) injection  30 mg Subcutaneous Q24H   insulin aspart  0-5 Units Subcutaneous QHS   insulin aspart  0-9 Units Subcutaneous  TID WC   oxyCODONE  40 mg Oral Q12H   pregabalin  100 mg Oral BID   Continuous Infusions:  azithromycin (ZITHROMAX) 500 mg in sodium chloride 0.9 % 250 mL IVPB     cefTRIAXone (ROCEPHIN)  IV     PRN Meds:.acetaminophen **OR** acetaminophen, ALPRAZolam   I have personally reviewed following labs and imaging studies  LABORATORY DATA: CBC: Recent Labs  Lab 06/18/22 2139 06/18/22 2154 06/19/22 0749  WBC 10.8*  --  7.1  NEUTROABS 9.6*  --   --   HGB 11.7* 12.2 10.3*  HCT 37.5 36.0 35.1*  MCV 94.5  --  100.3*  PLT 237  --  242    Basic Metabolic Panel: Recent Labs  Lab 06/18/22 2139 06/18/22 2154 06/19/22 0749  NA 135 136 137  K 5.1 5.2* 5.4*  CL 102  --  106  CO2 22  --  20*  GLUCOSE 187*  --  167*  BUN 40*  --  40*  CREATININE 2.30*  --  1.91*  CALCIUM 8.8*  --  7.7*  GFR: Estimated Creatinine Clearance: 36 mL/min (A) (by C-G formula based on SCr of 1.91 mg/dL (H)).  Liver Function Tests: Recent Labs  Lab 06/18/22 2139 06/19/22 0749  AST 91* 87*  ALT 51* 50*  ALKPHOS 52 45  BILITOT 0.5 1.0  PROT 6.8 5.9*  ALBUMIN 3.5 3.0*   No results for input(s): "LIPASE", "AMYLASE" in the last 168 hours. No results for input(s): "AMMONIA" in the last 168 hours.  Coagulation Profile: Recent Labs  Lab 06/18/22 2139  INR 1.1    Cardiac Enzymes: No results for input(s): "CKTOTAL", "CKMB", "CKMBINDEX", "TROPONINI" in the last 168 hours.  BNP (last 3 results) No results for input(s): "PROBNP" in the last 8760 hours.  Lipid Profile: No results for input(s): "CHOL", "HDL", "LDLCALC", "TRIG", "CHOLHDL", "LDLDIRECT" in the last 72 hours.  Thyroid Function Tests: No results for input(s): "TSH", "T4TOTAL", "FREET4", "T3FREE", "THYROIDAB" in the last 72 hours.  Anemia Panel: No results for input(s): "VITAMINB12", "FOLATE", "FERRITIN", "TIBC", "IRON", "RETICCTPCT" in the last 72 hours.  Urine analysis:    Component Value Date/Time   COLORURINE YELLOW  06/19/2022 0009   APPEARANCEUR HAZY (A) 06/19/2022 0009   LABSPEC 1.018 06/19/2022 0009   PHURINE 5.0 06/19/2022 0009   GLUCOSEU NEGATIVE 06/19/2022 0009   HGBUR MODERATE (A) 06/19/2022 0009   HGBUR negative 08/05/2009 1114   BILIRUBINUR NEGATIVE 06/19/2022 0009   KETONESUR NEGATIVE 06/19/2022 0009   PROTEINUR 30 (A) 06/19/2022 0009   UROBILINOGEN 0.2 05/22/2012 1101   NITRITE NEGATIVE 06/19/2022 0009   LEUKOCYTESUR SMALL (A) 06/19/2022 0009    Sepsis Labs: Lactic Acid, Venous    Component Value Date/Time   LATICACIDVEN 1.1 06/18/2022 2139    MICROBIOLOGY: Recent Results (from the past 240 hour(s))  Resp panel by RT-PCR (RSV, Flu A&B, Covid)     Status: None   Collection Time: 06/18/22  9:28 PM   Specimen: Nasal Swab  Result Value Ref Range Status   SARS Coronavirus 2 by RT PCR NEGATIVE NEGATIVE Final    Comment: (NOTE) SARS-CoV-2 target nucleic acids are NOT DETECTED.  The SARS-CoV-2 RNA is generally detectable in upper respiratory specimens during the acute phase of infection. The lowest concentration of SARS-CoV-2 viral copies this assay can detect is 138 copies/mL. A negative result does not preclude SARS-Cov-2 infection and should not be used as the sole basis for treatment or other patient management decisions. A negative result may occur with  improper specimen collection/handling, submission of specimen other than nasopharyngeal swab, presence of viral mutation(s) within the areas targeted by this assay, and inadequate number of viral copies(<138 copies/mL). A negative result must be combined with clinical observations, patient history, and epidemiological information. The expected result is Negative.  Fact Sheet for Patients:  EntrepreneurPulse.com.au  Fact Sheet for Healthcare Providers:  IncredibleEmployment.be  This test is no t yet approved or cleared by the [place] FDA and  has been authorized for detection  and/or diagnosis of SARS-CoV-2 by FDA under an Emergency Use Authorization (EUA). This EUA will remain  in effect (meaning this test can be used) for the duration of the COVID-19 declaration under Section 564(b)(1) of the Act, 21 U.S.C.section 360bbb-3(b)(1), unless the authorization is terminated  or revoked sooner.       Influenza A by PCR NEGATIVE NEGATIVE Final   Influenza B by PCR NEGATIVE NEGATIVE Final    Comment: (NOTE) The Xpert Xpress SARS-CoV-2/FLU/RSV plus assay is intended as an aid in the diagnosis of influenza from Nasopharyngeal swab specimens and  should not be used as a sole basis for treatment. Nasal washings and aspirates are unacceptable for Xpert Xpress SARS-CoV-2/FLU/RSV testing.  Fact Sheet for Patients: EntrepreneurPulse.com.au  Fact Sheet for Healthcare Providers: IncredibleEmployment.be  This test is not yet approved or cleared by the [place] FDA and has been authorized for detection and/or diagnosis of SARS-CoV-2 by FDA under an Emergency Use Authorization (EUA). This EUA will remain in effect (meaning this test can be used) for the duration of the COVID-19 declaration under Section 564(b)(1) of the Act, 21 U.S.C. section 360bbb-3(b)(1), unless the authorization is terminated or revoked.     Resp Syncytial Virus by PCR NEGATIVE NEGATIVE Final    Comment: (NOTE) Fact Sheet for Patients: EntrepreneurPulse.com.au  Fact Sheet for Healthcare Providers: IncredibleEmployment.be  This test is not yet approved or cleared by the [place] FDA and has been authorized for detection and/or diagnosis of SARS-CoV-2 by FDA under an Emergency Use Authorization (EUA). This EUA will remain in effect (meaning this test can be used) for the duration of the COVID-19 declaration under Section 564(b)(1) of the Act, 21 U.S.C. section 360bbb-3(b)(1), unless the authorization is terminated  or revoked.  Performed at [facility], [place] [address], [place], [place] [address]     RADIOLOGY STUDIES/RESULTS: CT Renal Stone Study  Result Date: 06/19/2022 CLINICAL DATA:  Abdominal pain. EXAM: CT ABDOMEN AND PELVIS WITHOUT CONTRAST TECHNIQUE: Multidetector CT imaging of the abdomen and pelvis was performed following the standard protocol without IV contrast. RADIATION DOSE REDUCTION: This exam was performed according to the departmental dose-optimization program which includes automated exposure control, adjustment of the mA and/or kV according to patient size and/or use of iterative reconstruction technique. COMPARISON:  None Available. FINDINGS: Lower chest: An 8 mm, ill-defined noncalcified lung nodule is seen within the anterior aspect of the right lung base. Hepatobiliary: There is diffuse fatty infiltration of the liver parenchyma. No focal liver abnormality is seen. Status post cholecystectomy. No biliary dilatation. Pancreas: Unremarkable. No pancreatic ductal dilatation or surrounding inflammatory changes. Spleen: Normal in size without focal abnormality. Adrenals/Urinary Tract: Adrenal glands are unremarkable. The right kidney is atrophic in size. The left kidney is normal in size, without focal lesions. There is mild left-sided hydronephrosis and hydroureter, without evidence of renal calculi. Mild to moderate severity right-sided perinephric inflammatory fat stranding is noted. The urinary bladder is markedly distended and otherwise unremarkable. Stomach/Bowel: Stomach is within normal limits. Appendix appears normal. No evidence of bowel wall thickening, distention, or inflammatory changes. Vascular/Lymphatic: Aortic atherosclerosis. No enlarged abdominal or pelvic lymph nodes. Reproductive: Status post hysterectomy. No adnexal masses. Other: No abdominal wall hernia or abnormality. No abdominopelvic ascites. Musculoskeletal: Postoperative hardware seen within the mid to lower  lumbar spine. No acute osseous abnormalities are identified. IMPRESSION: 1. Mild to moderate severity right-sided perinephric inflammatory fat stranding, which may represent sequelae associated with acute pyelonephritis. Correlation with urinalysis is recommended. 2. Mild left-sided hydronephrosis and hydroureter, without evidence of renal calculi. 3. Atrophic right kidney. 4. 8 mm, ill-defined noncalcified lung nodule within the anterior aspect of the right lung base. Further evaluation with nonemergent chest CT is recommended to determine the presence or absence of additional pulmonary nodules. 5. Hepatic steatosis. 6. Evidence of prior cholecystectomy and hysterectomy. 7. Aortic atherosclerosis. Aortic Atherosclerosis (ICD10-I70.0). Electronically Signed   By: Virgina Norfolk M.D.   On: 06/19/2022 01:04   CT HEAD WO CONTRAST (5MM)  Result Date: 06/19/2022 CLINICAL DATA:  Altered mental status. EXAM: CT HEAD WITHOUT CONTRAST  TECHNIQUE: Contiguous axial images were obtained from the base of the skull through the vertex without intravenous contrast. RADIATION DOSE REDUCTION: This exam was performed according to the departmental dose-optimization program which includes automated exposure control, adjustment of the mA and/or kV according to patient size and/or use of iterative reconstruction technique. COMPARISON:  July 13, 2020 FINDINGS: Brain: No evidence of acute infarction, hemorrhage, hydrocephalus, extra-axial collection or mass lesion/mass effect. Vascular: No hyperdense vessel or unexpected calcification. Skull: Normal. Negative for fracture or focal lesion. Sinuses/Orbits: No acute finding. Other: None. IMPRESSION: No acute intracranial pathology. Electronically Signed   By: Virgina Norfolk M.D.   On: 06/19/2022 01:01   DG Hip Unilat W or Wo Pelvis 2-3 Views Right  Result Date: 06/18/2022 CLINICAL DATA:  Hip pain EXAM: DG HIP (WITH OR WITHOUT PELVIS) 2-3V RIGHT COMPARISON:  None Available.  FINDINGS: There is no evidence of hip fracture or dislocation. There is no evidence of arthropathy or other focal bone abnormality. Lumbar fusion hardware is partially visualized. IMPRESSION: No acute fracture or dislocation. Electronically Signed   By: Ronney Asters M.D.   On: 06/18/2022 22:50   DG Chest Port 1 View  Result Date: 06/18/2022 CLINICAL DATA:  Sepsis, altered level of consciousness EXAM: PORTABLE CHEST 1 VIEW COMPARISON:  07/13/2020 FINDINGS: Single frontal view of the chest demonstrates a stable cardiac silhouette. Increased interstitial prominence greatest at the lung bases, with scattered areas of ground-glass airspace disease. No effusion or pneumothorax. No acute bony abnormality. IMPRESSION: 1. Interstitial prominence and basilar ground-glass airspace disease, which could reflect sequela of atypical viral pneumonia or edema. Electronically Signed   By: Randa Ngo M.D.   On: 06/18/2022 21:38     LOS: 0 days   Oren Binet, MD  [facility]    To contact the attending provider between 7A-7P or the covering provider during after hours 7P-7A, please log into the web site [email or web address] and access using universal [place] password for that web site. If you do not have the password, please call the hospital operator.  06/19/2022, 10:08 AM

## 2022-06-19 NOTE — ED Notes (Signed)
PT to CT.

## 2022-06-19 NOTE — H&P (Signed)
History and Physical    [name] UKG:[id number] DOB: 1961/01/25 DOA: 06/18/2022  PCP: Deland Pretty, MD  Patient coming from: Home  Chief Complaint: Altered mental status  HPI: [name] is a 62 y.o. female with medical history significant of hypertension, hyperlipidemia, GERD, type 2 diabetes, chronic pain, anxiety, recent diagnosis of breast cancer presented to the ED with altered mental status.  On arrival, patient noted to be febrile with temperature 102.4 F, tachycardic with heart rate in the 110s, blood pressure soft with systolic in the 76E. Office saturation 77% on room air, placed on 5 L O2.  Labs significant for WBC 10.8, hemoglobin 11.7 (previously in the 12-13 range), glucose 187, BUN 40, creatinine 2.3 (baseline 0.9), AST 91, ALT 51, alk phos and T. bili normal, COVID/influenza/RSV PCR negative, lactic acid normal, blood cultures drawn, VBG showing pH 7.35 and pCO2 42.3.  UA with negative nitrite, small amount of leukocytes, and microscopy showing 11-20 WBCs and rare bacteria.  Urine culture pending.  Chest x-ray showing interstitial prominence and basilar groundglass airspace disease which could reflect sequela of atypical viral pneumonia or edema.  X-ray of right hip/pelvis negative for acute fracture or dislocation.  CT renal stone study showing mild to moderate severity right-sided perinephric inflammatory fat stranding concerning for acute pyelonephritis.  Showing mild left-sided hydronephrosis and hydroureter without evidence of renal calculi.  CT head negative for acute finding. Patient was given Tylenol, fentanyl, Toradol, ceftriaxone, azithromycin, and 1 L normal saline bolus.  [facility] called to admit.  History provided by the patient and her daughter at bedside.  Daughter states patient had a cold since after Christmas and has not been feeling well.  Yesterday family noticed that she was very confused and disoriented, seeing snow in the bathroom.  Daughter states patient's  mental status has improved significantly since she has been in the ED. Patient currently AAO x 4.  She is reporting cough.  Denies shortness of breath or chest pain.  She is reporting urinary frequency/urgency and right-sided flank/hip pain.  No nausea or vomiting.  Reports history of UTIs in the past.  Recently diagnosed with breast cancer and is scheduled for bilateral mastectomies on 1/18.  Review of Systems:  Review of Systems  All other systems reviewed and are negative.   Past Medical History:  Diagnosis Date   Arthritis    Bladder spasms    Breast cancer ([place])    GERD (gastroesophageal reflux disease)    Hyperlipidemia    Hypertension     Past Surgical History:  Procedure Laterality Date   BACK SURGERY  1989, 1990, 2006   BREAST BIOPSY Left 05/14/2022   [place] LT BREAST BX W LOC DEV 1ST LESION IMG BX SPEC US GUIDE 05/14/2022 [facility]   BREAST BIOPSY Right 05/14/2022   MM RT BREAST BX W LOC DEV 1ST LESION IMAGE BX SPEC STEREO GUIDE 05/14/2022 [facility]   BREAST BIOPSY Left 05/14/2022   MM LT BREAST BX W LOC DEV 1ST LESION IMAGE BX SPEC STEREO GUIDE 05/14/2022 [facility]   BREAST BIOPSY Right 05/26/2022   MM RT BREAST BX W LOC DEV 1ST LESION IMAGE BX SPEC STEREO GUIDE 05/26/2022 [facility]   BREAST BIOPSY Right 05/26/2022   MM RT BREAST BX W LOC DEV EA AD LESION IMG BX SPEC STEREO GUIDE 05/26/2022 [facility]   BREAST LUMPECTOMY  1976   bi-lat , both benign    CHOLECYSTECTOMY  1998   LEG SURGERY  2009  work related injury   [place]       reports that she has quit smoking. Her smoking use included cigarettes. She has a 18.50 pack-year smoking history. She has never used smokeless tobacco. She reports that she does not drink alcohol and does not use drugs.  Allergies  Allergen Reactions   Codeine Phosphate Other (See Comments)    Gi upset   Simvastatin     REACTION: muscle' \\T'$ \ joint pains   Sulfamethoxazole     REACTION:  unspecified    Family History  Problem Relation Age of Onset   Diabetes Mother    Hypertension Mother    Heart disease Mother    Hypertension Father    Diabetes Father    Heart disease Father    Breast cancer Sister 49   Lymphoma Paternal Grandfather        dx after 14   Neuropathy Neg Hx     Prior to Admission medications   Medication Sig Start Date End Date Taking? Authorizing Provider  albuterol (VENTOLIN HFA) 108 (90 Base) MCG/ACT inhaler Inhale 2 puffs into the lungs every 6 (six) hours as needed for wheezing or shortness of breath. 07/16/20  Yes Ghimire, Henreitta Leber, MD  ALPRAZolam Duanne Moron) 0.25 MG tablet Take 0.25 mg by mouth 2 (two) times daily as needed for anxiety. 02/20/15  Yes [provider]  amitriptyline (ELAVIL) 50 MG tablet Take 50 mg by mouth at bedtime.   Yes [provider]  amLODipine (NORVASC) 10 MG tablet Take 10 mg by mouth daily. 05/05/20  Yes [provider]  hydrochlorothiazide (HYDRODIURIL) 12.5 MG tablet Take 12.5 mg by mouth every morning. 04/05/22  Yes [provider]  lidocaine (LIDODERM) 5 % Place 3 patches onto the skin daily. Remove & Discard patch within 12 hours or as directed by MD Patient taking differently: Place 3 patches onto the skin daily as needed (pain). 04/23/15  Yes Melvenia Beam, MD  loratadine (CLARITIN) 10 MG tablet Take 10 mg by mouth daily. For allergies   Yes [provider]  metFORMIN (GLUCOPHAGE-XR) 500 MG 24 hr tablet Take 500 mg by mouth 2 (two) times daily. 06/09/20  Yes [provider]  MOUNJARO 7.5 MG/0.5ML Pen SMARTSIG:7.5 Milligram(s) SUB-Q Every 4 Weeks 04/29/22  Yes [provider]  olmesartan (BENICAR) 20 MG tablet Take 20 mg by mouth daily. 04/08/22  Yes [provider]  oxyCODONE ER (XTAMPZA ER) 36 MG C12A Take 36 mg by mouth in the morning and at bedtime.   Yes [provider]  pantoprazole (PROTONIX) 40 MG tablet Take 40 mg by mouth daily.    Yes [provider]  pregabalin (LYRICA) 100 MG capsule Take 100 mg by mouth 2 (two) times daily.   Yes [provider]  cephALEXin (KEFLEX) 500 MG capsule Take 1 capsule (500 mg total) by mouth 4 (four) times daily for 5 days. Patient not taking: Reported on 06/19/2022 06/18/22 06/23/22  Scheeler, Carola Rhine, PA-C  ezetimibe (ZETIA) 10 MG tablet Take 10 mg by mouth daily.    [provider]  ondansetron (ZOFRAN) 4 MG tablet Take 1 tablet (4 mg total) by mouth every 8 (eight) hours as needed for nausea or vomiting. Patient not taking: Reported on 06/19/2022 06/18/22   Scheeler, Carola Rhine, PA-C    Physical Exam: Vitals:   06/19/22 0455 06/19/22 0500 06/19/22 0515 06/19/22 0530  BP: (!) 105/58 (!) 106/94    Pulse: 81 80 90 80  Resp:  $'17 18 18 17  'b$ Temp:      TempSrc:      SpO2: 94% 98% (!) 81% (!) 69%  Weight:      Height:        Physical Exam Vitals reviewed.  Constitutional:      General: She is not in acute distress. HENT:     Head: Normocephalic and atraumatic.  Eyes:     Extraocular Movements: Extraocular movements intact.  Cardiovascular:     Rate and Rhythm: Normal rate and regular rhythm.     Pulses: Normal pulses.  Pulmonary:     Effort: Pulmonary effort is normal. No respiratory distress.     Breath sounds: Rales present. No wheezing.  Abdominal:     General: Bowel sounds are normal. There is distension.     Palpations: Abdomen is soft.     Tenderness: There is no abdominal tenderness. There is no guarding.  Musculoskeletal:     Cervical back: Normal range of motion.     Right lower leg: No edema.     Left lower leg: No edema.  Skin:    General: Skin is warm and dry.  Neurological:     General: No focal deficit present.     Mental Status: She is alert and oriented to person, place, and time.     Labs on Admission: I have personally reviewed following labs and imaging studies  CBC: Recent Labs  Lab 06/18/22 2139 06/18/22 2154  WBC 10.8*   --   NEUTROABS 9.6*  --   HGB 11.7* 12.2  HCT 37.5 36.0  MCV 94.5  --   PLT 237  --    Basic Metabolic Panel: Recent Labs  Lab 06/18/22 2139 06/18/22 2154  NA 135 136  K 5.1 5.2*  CL 102  --   CO2 22  --   GLUCOSE 187*  --   BUN 40*  --   CREATININE 2.30*  --   CALCIUM 8.8*  --    GFR: Estimated Creatinine Clearance: 29.9 mL/min (A) (by C-G formula based on SCr of 2.3 mg/dL (H)). Liver Function Tests: Recent Labs  Lab 06/18/22 2139  AST 91*  ALT 51*  ALKPHOS 52  BILITOT 0.5  PROT 6.8  ALBUMIN 3.5   No results for input(s): "LIPASE", "AMYLASE" in the last 168 hours. No results for input(s): "AMMONIA" in the last 168 hours. Coagulation Profile: Recent Labs  Lab 06/18/22 2139  INR 1.1   Cardiac Enzymes: No results for input(s): "CKTOTAL", "CKMB", "CKMBINDEX", "TROPONINI" in the last 168 hours. BNP (last 3 results) No results for input(s): "PROBNP" in the last 8760 hours. HbA1C: No results for input(s): "HGBA1C" in the last 72 hours. CBG: No results for input(s): "GLUCAP" in the last 168 hours. Lipid Profile: No results for input(s): "CHOL", "HDL", "LDLCALC", "TRIG", "CHOLHDL", "LDLDIRECT" in the last 72 hours. Thyroid Function Tests: No results for input(s): "TSH", "T4TOTAL", "FREET4", "T3FREE", "THYROIDAB" in the last 72 hours. Anemia Panel: No results for input(s): "VITAMINB12", "FOLATE", "FERRITIN", "TIBC", "IRON", "RETICCTPCT" in the last 72 hours. Urine analysis:    Component Value Date/Time   COLORURINE YELLOW 06/19/2022 0009   APPEARANCEUR HAZY (A) 06/19/2022 0009   LABSPEC 1.018 06/19/2022 0009   PHURINE 5.0 06/19/2022 0009   GLUCOSEU NEGATIVE 06/19/2022 0009   HGBUR MODERATE (A) 06/19/2022 0009   HGBUR negative 08/05/2009 1114   BILIRUBINUR NEGATIVE 06/19/2022 0009   KETONESUR NEGATIVE 06/19/2022 0009   PROTEINUR 30 (A) 06/19/2022 0009   UROBILINOGEN  0.2 05/22/2012 1101   NITRITE NEGATIVE 06/19/2022 0009   LEUKOCYTESUR SMALL (A) 06/19/2022  0009    Radiological Exams on Admission: CT Renal Stone Study  Result Date: 06/19/2022 CLINICAL DATA:  Abdominal pain. EXAM: CT ABDOMEN AND PELVIS WITHOUT CONTRAST TECHNIQUE: Multidetector CT imaging of the abdomen and pelvis was performed following the standard protocol without IV contrast. RADIATION DOSE REDUCTION: This exam was performed according to the departmental dose-optimization program which includes automated exposure control, adjustment of the mA and/or kV according to patient size and/or use of iterative reconstruction technique. COMPARISON:  None Available. FINDINGS: Lower chest: An 8 mm, ill-defined noncalcified lung nodule is seen within the anterior aspect of the right lung base. Hepatobiliary: There is diffuse fatty infiltration of the liver parenchyma. No focal liver abnormality is seen. Status post cholecystectomy. No biliary dilatation. Pancreas: Unremarkable. No pancreatic ductal dilatation or surrounding inflammatory changes. Spleen: Normal in size without focal abnormality. Adrenals/Urinary Tract: Adrenal glands are unremarkable. The right kidney is atrophic in size. The left kidney is normal in size, without focal lesions. There is mild left-sided hydronephrosis and hydroureter, without evidence of renal calculi. Mild to moderate severity right-sided perinephric inflammatory fat stranding is noted. The urinary bladder is markedly distended and otherwise unremarkable. Stomach/Bowel: Stomach is within normal limits. Appendix appears normal. No evidence of bowel wall thickening, distention, or inflammatory changes. Vascular/Lymphatic: Aortic atherosclerosis. No enlarged abdominal or pelvic lymph nodes. Reproductive: Status post hysterectomy. No adnexal masses. Other: No abdominal wall hernia or abnormality. No abdominopelvic ascites. Musculoskeletal: Postoperative hardware seen within the mid to lower lumbar spine. No acute osseous abnormalities are identified. IMPRESSION: 1. Mild to  moderate severity right-sided perinephric inflammatory fat stranding, which may represent sequelae associated with acute pyelonephritis. Correlation with urinalysis is recommended. 2. Mild left-sided hydronephrosis and hydroureter, without evidence of renal calculi. 3. Atrophic right kidney. 4. 8 mm, ill-defined noncalcified lung nodule within the anterior aspect of the right lung base. Further evaluation with nonemergent chest CT is recommended to determine the presence or absence of additional pulmonary nodules. 5. Hepatic steatosis. 6. Evidence of prior cholecystectomy and hysterectomy. 7. Aortic atherosclerosis. Aortic Atherosclerosis (ICD10-I70.0). Electronically Signed   By: Virgina Norfolk M.D.   On: 06/19/2022 01:04   CT HEAD WO CONTRAST (5MM)  Result Date: 06/19/2022 CLINICAL DATA:  Altered mental status. EXAM: CT HEAD WITHOUT CONTRAST TECHNIQUE: Contiguous axial images were obtained from the base of the skull through the vertex without intravenous contrast. RADIATION DOSE REDUCTION: This exam was performed according to the departmental dose-optimization program which includes automated exposure control, adjustment of the mA and/or kV according to patient size and/or use of iterative reconstruction technique. COMPARISON:  July 13, 2020 FINDINGS: Brain: No evidence of acute infarction, hemorrhage, hydrocephalus, extra-axial collection or mass lesion/mass effect. Vascular: No hyperdense vessel or unexpected calcification. Skull: Normal. Negative for fracture or focal lesion. Sinuses/Orbits: No acute finding. Other: None. IMPRESSION: No acute intracranial pathology. Electronically Signed   By: Virgina Norfolk M.D.   On: 06/19/2022 01:01   DG Hip Unilat W or Wo Pelvis 2-3 Views Right  Result Date: 06/18/2022 CLINICAL DATA:  Hip pain EXAM: DG HIP (WITH OR WITHOUT PELVIS) 2-3V RIGHT COMPARISON:  None Available. FINDINGS: There is no evidence of hip fracture or dislocation. There is no evidence of  arthropathy or other focal bone abnormality. Lumbar fusion hardware is partially visualized. IMPRESSION: No acute fracture or dislocation. Electronically Signed   By: Ronney Asters M.D.   On: 06/18/2022 22:50  DG Chest Port 1 View  Result Date: 06/18/2022 CLINICAL DATA:  Sepsis, altered level of consciousness EXAM: PORTABLE CHEST 1 VIEW COMPARISON:  07/13/2020 FINDINGS: Single frontal view of the chest demonstrates a stable cardiac silhouette. Increased interstitial prominence greatest at the lung bases, with scattered areas of ground-glass airspace disease. No effusion or pneumothorax. No acute bony abnormality. IMPRESSION: 1. Interstitial prominence and basilar ground-glass airspace disease, which could reflect sequela of atypical viral pneumonia or edema. Electronically Signed   By: Randa Ngo M.D.   On: 06/18/2022 21:38    EKG: Independently reviewed.  Sinus tachycardia.  RBBB new since prior tracing from January 2022.  Assessment and Plan  Acute hypoxemic respiratory failure likely secondary to atypical/viral pneumonia Patient is complaining of cough.  Oxygen saturation 77% on room air, currently requiring 6 L O2 to maintain sats in the 90s.  Chest x-ray showing interstitial prominence and basilar groundglass airspace disease which could reflect sequela of atypical viral pneumonia or edema.  Patient was febrile on arrival to the ED with temperature 102.4 F.  WBC count 10.8.  COVID/influenza/RSV PCR negative.  No documented history of CHF or prior echo results in the chart. -Continue ceftriaxone and azithromycin -Blood cultures pending -Respiratory viral panel -Sputum Gram stain and culture -MRSA PCR screen -Strep pneumo and Legionella urinary antigens -Procalcitonin -BNP -Continue supplemental oxygen, wean as tolerated  Acute pyelonephritis Patient is endorsing urinary symptoms and right-sided flank pain.  UA with evidence of pyuria. CT showing mild to moderate severity right-sided  perinephric inflammatory fat stranding concerning for acute pyelonephritis.  -Continue ceftriaxone -Urine and blood cultures pending  AKI Likely prerenal secondary to dehydration and severe sepsis.  BUN 40, creatinine 2.3 (baseline 0.9).  CT showing mild left-sided hydronephrosis and hydroureter without evidence of renal calculi.   -Patient received 1 L IV fluids in the ED.  Holding additional IV fluids at this time until BNP is checked given concern for possible pulmonary edema on imaging. -Avoid nephrotoxic agents/contrast/NSAIDs.  Hold home HCTZ and olmesartan. -Repeat labs to check renal function  Acute metabolic encephalopathy Likely secondary to UTI.  No meningeal signs.  CT head negative for acute finding and no focal neurodeficit on exam.  Mental status has now improved significantly and she is currently AAO x 4. -Continue to monitor  Severe sepsis Likely secondary to pneumonia and pyelonephritis.  Met criteria for severe sepsis at the time of presentation with fever, tachycardia, soft blood pressure, AKI, and acute encephalopathy.  Lactic acid normal.  Patient received 1 L IV fluids in the ED and maintaining systolic above 90. -Holding additional IV fluids at this time until BNP is checked given concern for possible pulmonary edema on imaging. -Continue antibiotics -Tylenol as needed for fevers -Urine and blood cultures pending  Mild normocytic anemia Hemoglobin 11.7 (previously in the 12-13 range).  No signs or symptoms of bleeding. -Repeat CBC  Type 2 diabetes Last A1c 6.7 in January 2022. -Repeat A1c -Sensitive sliding scale insulin ACHS  Mildly elevated transaminases Possibly secondary to severe sepsis.  Alk phos and T. bili normal.  CT showing hepatic steatosis. -Repeat labs  Incidental pulmonary nodule CT renal stone study showing an 8 mm ill-defined noncalcified lung nodule within the anterior aspect of the right lung base.  She is a former smoker. -Will need  nonemergent CT chest to determine the presence or absence of additional pulmonary nodules.  Recent diagnosis of breast cancer Not started on treatment yet and is scheduled for bilateral mastectomy later  this month. -Outpatient follow-up with surgery, oncology, and radiation oncology.  Hypertension -Avoid antihypertensives at this time given concern for severe sepsis/soft blood pressure  DVT prophylaxis: Lovenox (dose adjusted based on renal function) Code Status: Full Code (discussed with the patient and her daughter) Family Communication: Daughter at bedside. Level of care: Progressive Care Unit Admission status: It is my clinical opinion that admission to INPATIENT is reasonable and necessary because of the expectation that this patient will require hospital care that crosses at least 2 midnights to treat this condition based on the medical complexity of the problems presented.  Given the aforementioned information, the predictability of an adverse outcome is felt to be significant.   Shela Leff MD [facility]  If 7PM-7AM, please contact night-coverage [email or web address]  06/19/2022, 5:43 AM

## 2022-06-19 NOTE — ED Notes (Signed)
Patient sleeping on right side with [place] in nose in bed with daughter at bedside.

## 2022-06-19 NOTE — ED Provider Notes (Signed)
Patient awake and alert.  She reports has had a recent cough.  She is not on oxygen chronically.  Patient found to have respiratory failure likely due to pneumonia.  Her mental status is improved.  Discussed with Dr. Marlowe Sax for admission She request that I add on a CT head   Ripley Fraise, MD 06/19/22 0102

## 2022-06-20 ENCOUNTER — Inpatient Hospital Stay (HOSPITAL_COMMUNITY): Payer: BC Managed Care – PPO

## 2022-06-20 DIAGNOSIS — G9341 Metabolic encephalopathy: Secondary | ICD-10-CM | POA: Diagnosis not present

## 2022-06-20 DIAGNOSIS — J9601 Acute respiratory failure with hypoxia: Secondary | ICD-10-CM | POA: Diagnosis not present

## 2022-06-20 DIAGNOSIS — N10 Acute tubulo-interstitial nephritis: Secondary | ICD-10-CM | POA: Diagnosis not present

## 2022-06-20 DIAGNOSIS — J189 Pneumonia, unspecified organism: Secondary | ICD-10-CM | POA: Diagnosis not present

## 2022-06-20 LAB — BASIC METABOLIC PANEL
Anion gap: 10 (ref 5–15)
BUN: 25 mg/dL — ABNORMAL HIGH (ref 8–23)
CO2: 25 mmol/L (ref 22–32)
Calcium: 8.4 mg/dL — ABNORMAL LOW (ref 8.9–10.3)
Chloride: 102 mmol/L (ref 98–111)
Creatinine, Ser: 1.44 mg/dL — ABNORMAL HIGH (ref 0.44–1.00)
GFR, Estimated: 41 mL/min — ABNORMAL LOW (ref >60)
Glucose, Bld: 126 mg/dL — ABNORMAL HIGH (ref 70–99)
Potassium: 4.7 mmol/L (ref 3.5–5.1)
Sodium: 137 mmol/L (ref 135–145)

## 2022-06-20 LAB — GLUCOSE, CAPILLARY
Glucose-Capillary: 118 mg/dL — ABNORMAL HIGH (ref 70–99)
Glucose-Capillary: 116 mg/dL — ABNORMAL HIGH (ref 70–99)
Glucose-Capillary: 118 mg/dL — ABNORMAL HIGH (ref 70–99)
Glucose-Capillary: 188 mg/dL — ABNORMAL HIGH (ref 70–99)

## 2022-06-20 LAB — CBC
HCT: 33.3 % — ABNORMAL LOW (ref 36.0–46.0)
Hemoglobin: 10.6 g/dL — ABNORMAL LOW (ref 12.0–15.0)
MCH: 30.1 pg (ref 26.0–34.0)
MCHC: 31.8 g/dL (ref 30.0–36.0)
MCV: 94.6 fL (ref 80.0–100.0)
Platelets: 187 10*3/uL (ref 150–400)
RBC: 3.52 MIL/uL — ABNORMAL LOW (ref 3.87–5.11)
RDW: 16.4 % — ABNORMAL HIGH (ref 11.5–15.5)
WBC: 6.8 10*3/uL (ref 4.0–10.5)
nRBC: 0 % (ref 0.0–0.2)

## 2022-06-20 MED ORDER — OXYCODONE HCL 5 MG PO TABS
5.0000 mg | ORAL_TABLET | Freq: Four times a day (QID) | ORAL | Status: DC | PRN
  Administered 2022-06-20 – 2022-06-21 (×2): 5 mg via ORAL
  Filled 2022-06-20 (×3): qty 1

## 2022-06-20 MED ORDER — AZITHROMYCIN 500 MG PO TABS
500.0000 mg | ORAL_TABLET | ORAL | Status: DC
  Administered 2022-06-20: 500 mg via ORAL
  Filled 2022-06-20: qty 1

## 2022-06-20 NOTE — Progress Notes (Signed)
PROGRESS NOTE        PATIENT DETAILS Name: [name] Age: 62 y.o. Sex: female Date of Birth: July 22, 1960 Admit Date: 06/18/2022 Admitting Physician Evalee Mutton Kristeen Mans, MD [id number], Thayer Jew, MD  Brief Summary: Patient is a 62 y.o.  female with history of HTN, HLD, DM-2, GERD, chronic pain syndrome on narcotics-recently diagnosed with bilateral breast cancer-presented with acute metabolic encephalopathy in the setting of pyelonephritis.  Significant events: 1/5>> admit to [facility] for encephalopathy in the setting of pyelonephritis.  Febrile on initial presentation.  Significant studies: 1/5>> x-ray right hip: No fracture/dislocation 1/5>> CXR: Interstitial prominence/bibasilar groundglass airspace disease 1/6>> CT renal stone study: Right-sided perinephric inflammatory fat stranding, mild left-sided hydronephrosis/hydroureter.  8 mm noncalcified lung nodule. 1/6>> CT head: No acute intracranial abnormality 1/6>> CT chest: 3 mm nodule right apex, mild heterogeneous groundglass attenuation throughout both lungs-reflecting small disease.  Subpleural interstitial reticulation within left upper lobe/left lower lobe-more confluent peripheral airspace disease in the left lower lobe.  Significant microbiology data: 1/5>> COVID/influenza/RSV PCR: Negative 1/5>> respiratory virus panel: Negative 1/5>> urine culture: Pending 1/5>> blood culture: Pending  Procedures: None  Consults: None  Subjective: Continues to have pain in the right gluteal area.  Appears to have sleep apnea-was placed on NRB overnight, but this morning-she was titrated to room air.  Objective: Vitals: Blood pressure 134/77, pulse 94, temperature 98.4 F (36.9 C), temperature source Axillary, resp. rate 16, height '5\' 6"'$  (1.676 m), weight 100 kg, last menstrual period 04/22/1997, SpO2 100 %.   Exam: Gen Exam:Alert awake-not in any distress HEENT:atraumatic, normocephalic Chest: B/L clear to  auscultation anteriorly CVS:S1S2 regular Abdomen:soft non tender, non distended Extremities:no edema Neurology: Non focal Skin: no rash  Pertinent Labs/Radiology:    Latest Ref Rng & Units 06/20/2022    3:36 AM 06/19/2022    7:49 AM 06/18/2022    9:54 PM  CBC  WBC 4.0 - 10.5 K/uL 6.8  7.1    Hemoglobin 12.0 - 15.0 g/dL 10.6  10.3  12.2   Hematocrit 36.0 - 46.0 % 33.3  35.1  36.0   Platelets 150 - 400 K/uL 187  155      Lab Results  Component Value Date   NA 137 06/20/2022   K 4.7 06/20/2022   CL 102 06/20/2022   CO2 25 06/20/2022      Assessment/Plan: Acute metabolic encephalopathy Likely due to pyelonephritis-possible PNA Resolved-completely awake and alert.   Severe sepsis Right-sided pyelonephritis Possible PNA UA clean-as patient already on Keflex x 2 days prior to this hospitalization. CT chest with some suggestion of PNA but she is completely asymptomatic-no cough/SOB. Continue Rocephin-await urine/blood cultures.  Right gluteal/right hip pain X-ray-CT renal stone study negative for any significant hip pathology Had discussed with radiology over the phone on 1/6-no fluid collection/effusion in right hip area. Since continues to complain of right gluteal pain-will check MRI.  AKI Likely hemodynamically mediated-improving with supportive care Avoid nephrotoxic agents Continue to follow electrolytes.  ?  Hypoxia Appears to have mostly nocturnal hypoxia-on room air this morning.  Suspect she may have undiagnosed OSA. Continue to follow. Needs outpatient sleep study.  HTN BP slowly creeping up-but still stable. Continue to hold all antihypertensives-resume when able  DM-2 CBG stable with SSI Continue to hold metformin  Recent Labs    06/19/22 1650 06/19/22 2145 06/20/22 0830  GLUCAP 189*  144* 118*     Chronic pain syndrome Now that encephalopathy has resolved-usual narcotic regimen/Lyrica has been resumed.    8 mm lung nodule right lung base 3 mm  nodule right apex Radiology recommending repeat CT chest x 1 year.  Recent diagnosis of bilateral breast cancer Scheduled for mastectomy-bilaterally on 1/18. Follows with [name].  Obesity: Estimated body mass index is 35.57 kg/m as calculated from the following:   Height as of this encounter: '5\' 6"'$  (1.676 m).   Weight as of this encounter: 100 kg.   Additional time spent equals 35 minutes.  Code status:   Code Status: Full Code   DVT Prophylaxis: enoxaparin (LOVENOX) injection 40 mg Start: 06/20/22 0800   Family Communication: Daughter at bedside  Disposition Plan: Status is: Inpatient Remains inpatient appropriate because: Severity of illness.   Planned Discharge Destination:Home   Diet: Diet Order             Diet heart healthy/carb modified Room service appropriate? Yes; Fluid consistency: Thin  Diet effective now                     Antimicrobial agents: Anti-infectives (From admission, onward)    Start     Dose/Rate Route Frequency Ordered Stop   06/19/22 2200  azithromycin (ZITHROMAX) 500 mg in sodium chloride 0.9 % 250 mL IVPB        500 mg 250 mL/hr over 60 Minutes Intravenous Every 24 hours 06/19/22 0633     06/19/22 2200  cefTRIAXone (ROCEPHIN) 2 g in sodium chloride 0.9 % 100 mL IVPB        2 g 200 mL/hr over 30 Minutes Intravenous Every 24 hours 06/19/22 0633     06/18/22 2200  cefTRIAXone (ROCEPHIN) 2 g in sodium chloride 0.9 % 100 mL IVPB        2 g 200 mL/hr over 30 Minutes Intravenous  Once 06/18/22 2152 06/18/22 2243   06/18/22 2200  azithromycin (ZITHROMAX) 500 mg in sodium chloride 0.9 % 250 mL IVPB        500 mg 250 mL/hr over 60 Minutes Intravenous  Once 06/18/22 2152 06/19/22 0106        MEDICATIONS: Scheduled Meds:  enoxaparin (LOVENOX) injection  40 mg Subcutaneous Q24H   insulin aspart  0-5 Units Subcutaneous QHS   insulin aspart  0-9 Units Subcutaneous TID WC   lidocaine  1 patch Transdermal Q24H   oxyCODONE  40 mg  Oral Q12H   pregabalin  100 mg Oral BID   Continuous Infusions:  azithromycin (ZITHROMAX) 500 mg in sodium chloride 0.9 % 250 mL IVPB 500 mg (06/19/22 2250)   cefTRIAXone (ROCEPHIN)  IV 2 g (06/19/22 2138)   PRN Meds:.acetaminophen **OR** acetaminophen, ALPRAZolam, oxyCODONE   I have personally reviewed following labs and imaging studies  LABORATORY DATA: CBC: Recent Labs  Lab 06/18/22 2139 06/18/22 2154 06/19/22 0749 06/20/22 0336  WBC 10.8*  --  7.1 6.8  NEUTROABS 9.6*  --   --   --   HGB 11.7* 12.2 10.3* 10.6*  HCT 37.5 36.0 35.1* 33.3*  MCV 94.5  --  100.3* 94.6  PLT 237  --  155 187     Basic Metabolic Panel: Recent Labs  Lab 06/18/22 2139 06/18/22 2154 06/19/22 0749 06/20/22 0336  NA 135 136 137 137  K 5.1 5.2* 5.4* 4.7  CL 102  --  106 102  CO2 22  --  20* 25  GLUCOSE 187*  --  167* 126*  BUN 40*  --  40* 25*  CREATININE 2.30*  --  1.91* 1.44*  CALCIUM 8.8*  --  7.7* 8.4*     GFR: Estimated Creatinine Clearance: 47.7 mL/min (A) (by C-G formula based on SCr of 1.44 mg/dL (H)).  Liver Function Tests: Recent Labs  Lab 06/18/22 2139 06/19/22 0749  AST 91* 87*  ALT 51* 50*  ALKPHOS 52 45  BILITOT 0.5 1.0  PROT 6.8 5.9*  ALBUMIN 3.5 3.0*    No results for input(s): "LIPASE", "AMYLASE" in the last 168 hours. No results for input(s): "AMMONIA" in the last 168 hours.  Coagulation Profile: Recent Labs  Lab 06/18/22 2139  INR 1.1     Cardiac Enzymes: No results for input(s): "CKTOTAL", "CKMB", "CKMBINDEX", "TROPONINI" in the last 168 hours.  BNP (last 3 results) No results for input(s): "PROBNP" in the last 8760 hours.  Lipid Profile: No results for input(s): "CHOL", "HDL", "LDLCALC", "TRIG", "CHOLHDL", "LDLDIRECT" in the last 72 hours.  Thyroid Function Tests: No results for input(s): "TSH", "T4TOTAL", "FREET4", "T3FREE", "THYROIDAB" in the last 72 hours.  Anemia Panel: No results for input(s): "VITAMINB12", "FOLATE", "FERRITIN",  "TIBC", "IRON", "RETICCTPCT" in the last 72 hours.  Urine analysis:    Component Value Date/Time   COLORURINE YELLOW 06/19/2022 0009   APPEARANCEUR HAZY (A) 06/19/2022 0009   LABSPEC 1.018 06/19/2022 0009   PHURINE 5.0 06/19/2022 0009   GLUCOSEU NEGATIVE 06/19/2022 0009   HGBUR MODERATE (A) 06/19/2022 0009   HGBUR negative 08/05/2009 1114   BILIRUBINUR NEGATIVE 06/19/2022 0009   KETONESUR NEGATIVE 06/19/2022 0009   PROTEINUR 30 (A) 06/19/2022 0009   UROBILINOGEN 0.2 05/22/2012 1101   NITRITE NEGATIVE 06/19/2022 0009   LEUKOCYTESUR SMALL (A) 06/19/2022 0009    Sepsis Labs: Lactic Acid, Venous    Component Value Date/Time   LATICACIDVEN 1.1 06/18/2022 2139    MICROBIOLOGY: Recent Results (from the past 240 hour(s))  Resp panel by RT-PCR (RSV, Flu A&B, Covid)     Status: None   Collection Time: 06/18/22  9:28 PM   Specimen: Nasal Swab  Result Value Ref Range Status   SARS Coronavirus 2 by RT PCR NEGATIVE NEGATIVE Final    Comment: (NOTE) SARS-CoV-2 target nucleic acids are NOT DETECTED.  The SARS-CoV-2 RNA is generally detectable in upper respiratory specimens during the acute phase of infection. The lowest concentration of SARS-CoV-2 viral copies this assay can detect is 138 copies/mL. A negative result does not preclude SARS-Cov-2 infection and should not be used as the sole basis for treatment or other patient management decisions. A negative result may occur with  improper specimen collection/handling, submission of specimen other than nasopharyngeal swab, presence of viral mutation(s) within the areas targeted by this assay, and inadequate number of viral copies(<138 copies/mL). A negative result must be combined with clinical observations, patient history, and epidemiological information. The expected result is Negative.  Fact Sheet for Patients:  EntrepreneurPulse.com.au  Fact Sheet for Healthcare Providers:   IncredibleEmployment.be  This test is no t yet approved or cleared by the [place] FDA and  has been authorized for detection and/or diagnosis of SARS-CoV-2 by FDA under an Emergency Use Authorization (EUA). This EUA will remain  in effect (meaning this test can be used) for the duration of the COVID-19 declaration under Section 564(b)(1) of the Act, 21 U.S.C.section 360bbb-3(b)(1), unless the authorization is terminated  or revoked sooner.       Influenza A by PCR NEGATIVE NEGATIVE Final   Influenza  B by PCR NEGATIVE NEGATIVE Final    Comment: (NOTE) The Xpert Xpress SARS-CoV-2/FLU/RSV plus assay is intended as an aid in the diagnosis of influenza from Nasopharyngeal swab specimens and should not be used as a sole basis for treatment. Nasal washings and aspirates are unacceptable for Xpert Xpress SARS-CoV-2/FLU/RSV testing.  Fact Sheet for Patients: EntrepreneurPulse.com.au  Fact Sheet for Healthcare Providers: IncredibleEmployment.be  This test is not yet approved or cleared by the [place] FDA and has been authorized for detection and/or diagnosis of SARS-CoV-2 by FDA under an Emergency Use Authorization (EUA). This EUA will remain in effect (meaning this test can be used) for the duration of the COVID-19 declaration under Section 564(b)(1) of the Act, 21 U.S.C. section 360bbb-3(b)(1), unless the authorization is terminated or revoked.     Resp Syncytial Virus by PCR NEGATIVE NEGATIVE Final    Comment: (NOTE) Fact Sheet for Patients: EntrepreneurPulse.com.au  Fact Sheet for Healthcare Providers: IncredibleEmployment.be  This test is not yet approved or cleared by the [place] FDA and has been authorized for detection and/or diagnosis of SARS-CoV-2 by FDA under an Emergency Use Authorization (EUA). This EUA will remain in effect (meaning this test can be used) for  the duration of the COVID-19 declaration under Section 564(b)(1) of the Act, 21 U.S.C. section 360bbb-3(b)(1), unless the authorization is terminated or revoked.  Performed at [facility], [place] [address] [place], [place] [address]   Respiratory (~20 pathogens) panel by PCR     Status: None   Collection Time: 06/19/22 11:00 AM   Specimen: Nasopharyngeal Swab; Respiratory  Result Value Ref Range Status   Adenovirus NOT DETECTED NOT DETECTED Final   Coronavirus 229E NOT DETECTED NOT DETECTED Final    Comment: (NOTE) The Coronavirus on the Respiratory Panel, DOES NOT test for the novel  Coronavirus (2019 nCoV)    Coronavirus HKU1 NOT DETECTED NOT DETECTED Final   Coronavirus NL63 NOT DETECTED NOT DETECTED Final   Coronavirus OC43 NOT DETECTED NOT DETECTED Final   Metapneumovirus NOT DETECTED NOT DETECTED Final   Rhinovirus / Enterovirus NOT DETECTED NOT DETECTED Final   Influenza A NOT DETECTED NOT DETECTED Final   Influenza B NOT DETECTED NOT DETECTED Final   Parainfluenza Virus 1 NOT DETECTED NOT DETECTED Final   Parainfluenza Virus 2 NOT DETECTED NOT DETECTED Final   Parainfluenza Virus 3 NOT DETECTED NOT DETECTED Final   Parainfluenza Virus 4 NOT DETECTED NOT DETECTED Final   Respiratory Syncytial Virus NOT DETECTED NOT DETECTED Final   Bordetella pertussis NOT DETECTED NOT DETECTED Final   Bordetella Parapertussis NOT DETECTED NOT DETECTED Final   Chlamydophila pneumoniae NOT DETECTED NOT DETECTED Final   Mycoplasma pneumoniae NOT DETECTED NOT DETECTED Final    Comment: Performed at [facility], [place]. [address] [place], [place] [address]    RADIOLOGY STUDIES/RESULTS: CT CHEST WO CONTRAST  Result Date: 06/19/2022 CLINICAL DATA:  Evaluate lung nodule. EXAM: CT CHEST WITHOUT CONTRAST TECHNIQUE: Multidetector CT imaging of the chest was performed following the standard protocol without IV contrast. RADIATION DOSE REDUCTION: This exam was performed according to the  departmental dose-optimization program which includes automated exposure control, adjustment of the mA and/or kV according to patient size and/or use of iterative reconstruction technique. COMPARISON:  CT AP from earlier today and coronary calcium score CT from 03/22/2022 FINDINGS: Cardiovascular: Heart size is upper limits of normal. No pericardial effusion. Aortic atherosclerosis. Mediastinum/Nodes: Thyroid gland, trachea, and esophagus are unremarkable. No axillary or mediastinal adenopathy.  Hilar lymph nodes suboptimally evaluated due to lack of IV contrast. Lungs/Pleura: No pleural fluid identified. Mild heterogeneous ground-glass attenuation noted throughout both lungs. Asymmetric peripheral predominant subpleural interstitial reticulation is identified within the left upper lobe and left lower lobe. More confluent, peripheral airspace disease is noted within the left lower lobe, image 118/8. -Perifissural nodule along the minor fissure which measures 0.9 x 0.5 cm, image 42/7 and image 77/8. This is unchanged from the exam from 03/22/2022 and likely represents a benign intrapulmonary lymph node. -Perifissural nodule along the minor fissure is also again seen measuring 0.8 x 0.3 cm, image 40/7 and image 78/8. -Unchanged scar like density within the anterior right middle lobe measuring 1.6 cm, image 76/8. -Tiny nodule within the right apex measures 3 mm, image 22/8. No prior imaging for comparison. Upper Abdomen: Diffuse hepatic steatosis. Status post cholecystectomy. Musculoskeletal: No chest wall mass or suspicious bone lesions identified. IMPRESSION: 1. Stable appearance of perifissural nodules along the minor fissure. These are favored to represent benign intrapulmonary lymph nodes. 2. Asymmetric peripheral predominant subpleural interstitial reticulation is identified within the left upper lobe and left lower lobe. More confluent, peripheral airspace disease is noted within the left lower lobe. Findings are  inflammatory/infectious in etiology. 3. Mild heterogeneous ground-glass attenuation noted throughout both lungs. This is a nonspecific finding and may reflect underlying small airways disease. 4. Hepatic steatosis. 5. 3 mm nodule within the right apex. No prior imaging for comparison. If the patient is at high risk for bronchogenic carcinoma, follow-up chest CT at 1year is recommended. If the patient is at low risk, no follow-up is needed. This recommendation follows the consensus statement: Guidelines for Management of Small Pulmonary Nodules Detected on CT Scans: A Statement from the [place] as published in Radiology 2005; 237:395-400. 6.  Aortic Atherosclerosis (ICD10-I70.0). Electronically Signed   By: Kerby Moors M.D.   On: 06/19/2022 11:48   CT Renal Stone Study  Result Date: 06/19/2022 CLINICAL DATA:  Abdominal pain. EXAM: CT ABDOMEN AND PELVIS WITHOUT CONTRAST TECHNIQUE: Multidetector CT imaging of the abdomen and pelvis was performed following the standard protocol without IV contrast. RADIATION DOSE REDUCTION: This exam was performed according to the departmental dose-optimization program which includes automated exposure control, adjustment of the mA and/or kV according to patient size and/or use of iterative reconstruction technique. COMPARISON:  None Available. FINDINGS: Lower chest: An 8 mm, ill-defined noncalcified lung nodule is seen within the anterior aspect of the right lung base. Hepatobiliary: There is diffuse fatty infiltration of the liver parenchyma. No focal liver abnormality is seen. Status post cholecystectomy. No biliary dilatation. Pancreas: Unremarkable. No pancreatic ductal dilatation or surrounding inflammatory changes. Spleen: Normal in size without focal abnormality. Adrenals/Urinary Tract: Adrenal glands are unremarkable. The right kidney is atrophic in size. The left kidney is normal in size, without focal lesions. There is mild left-sided hydronephrosis and  hydroureter, without evidence of renal calculi. Mild to moderate severity right-sided perinephric inflammatory fat stranding is noted. The urinary bladder is markedly distended and otherwise unremarkable. Stomach/Bowel: Stomach is within normal limits. Appendix appears normal. No evidence of bowel wall thickening, distention, or inflammatory changes. Vascular/Lymphatic: Aortic atherosclerosis. No enlarged abdominal or pelvic lymph nodes. Reproductive: Status post hysterectomy. No adnexal masses. Other: No abdominal wall hernia or abnormality. No abdominopelvic ascites. Musculoskeletal: Postoperative hardware seen within the mid to lower lumbar spine. No acute osseous abnormalities are identified. IMPRESSION: 1. Mild to moderate severity right-sided perinephric inflammatory fat stranding, which may represent sequelae associated with  acute pyelonephritis. Correlation with urinalysis is recommended. 2. Mild left-sided hydronephrosis and hydroureter, without evidence of renal calculi. 3. Atrophic right kidney. 4. 8 mm, ill-defined noncalcified lung nodule within the anterior aspect of the right lung base. Further evaluation with nonemergent chest CT is recommended to determine the presence or absence of additional pulmonary nodules. 5. Hepatic steatosis. 6. Evidence of prior cholecystectomy and hysterectomy. 7. Aortic atherosclerosis. Aortic Atherosclerosis (ICD10-I70.0). Electronically Signed   By: Virgina Norfolk M.D.   On: 06/19/2022 01:04   CT HEAD WO CONTRAST (5MM)  Result Date: 06/19/2022 CLINICAL DATA:  Altered mental status. EXAM: CT HEAD WITHOUT CONTRAST TECHNIQUE: Contiguous axial images were obtained from the base of the skull through the vertex without intravenous contrast. RADIATION DOSE REDUCTION: This exam was performed according to the departmental dose-optimization program which includes automated exposure control, adjustment of the mA and/or kV according to patient size and/or use of iterative  reconstruction technique. COMPARISON:  July 13, 2020 FINDINGS: Brain: No evidence of acute infarction, hemorrhage, hydrocephalus, extra-axial collection or mass lesion/mass effect. Vascular: No hyperdense vessel or unexpected calcification. Skull: Normal. Negative for fracture or focal lesion. Sinuses/Orbits: No acute finding. Other: None. IMPRESSION: No acute intracranial pathology. Electronically Signed   By: Virgina Norfolk M.D.   On: 06/19/2022 01:01   DG Hip Unilat W or Wo Pelvis 2-3 Views Right  Result Date: 06/18/2022 CLINICAL DATA:  Hip pain EXAM: DG HIP (WITH OR WITHOUT PELVIS) 2-3V RIGHT COMPARISON:  None Available. FINDINGS: There is no evidence of hip fracture or dislocation. There is no evidence of arthropathy or other focal bone abnormality. Lumbar fusion hardware is partially visualized. IMPRESSION: No acute fracture or dislocation. Electronically Signed   By: Ronney Asters M.D.   On: 06/18/2022 22:50   DG Chest Port 1 View  Result Date: 06/18/2022 CLINICAL DATA:  Sepsis, altered level of consciousness EXAM: PORTABLE CHEST 1 VIEW COMPARISON:  07/13/2020 FINDINGS: Single frontal view of the chest demonstrates a stable cardiac silhouette. Increased interstitial prominence greatest at the lung bases, with scattered areas of ground-glass airspace disease. No effusion or pneumothorax. No acute bony abnormality. IMPRESSION: 1. Interstitial prominence and basilar ground-glass airspace disease, which could reflect sequela of atypical viral pneumonia or edema. Electronically Signed   By: Randa Ngo M.D.   On: 06/18/2022 21:38     LOS: 1 day   Oren Binet, MD  [facility]    To contact the attending provider between 7A-7P or the covering provider during after hours 7P-7A, please log into the web site [email or web address] and access using universal [place] password for that web site. If you do not have the password, please call the hospital operator.  06/20/2022, 10:40 AM

## 2022-06-20 NOTE — Care Management (Signed)
  Transition of Care [facility]) Screening Note   Patient Details  Name: [name] Date of Birth: Oct 10, 1960   Transition of Care [facility]) CM/SW Contact:    Carles Collet, RN Phone Number: 06/20/2022, 2:34 PM    Transition of Care Department [facility]) has reviewed patient and  We will continue to monitor patient advancement through interdisciplinary progression rounds. If new patient transition needs arise, please place a TOC consult.   Adm w AMS, from home, no HH Hx in bamboo portal

## 2022-06-21 ENCOUNTER — Other Ambulatory Visit (HOSPITAL_COMMUNITY): Payer: Self-pay

## 2022-06-21 ENCOUNTER — Telehealth: Payer: Self-pay | Admitting: *Deleted

## 2022-06-21 DIAGNOSIS — A419 Sepsis, unspecified organism: Secondary | ICD-10-CM

## 2022-06-21 DIAGNOSIS — J189 Pneumonia, unspecified organism: Secondary | ICD-10-CM | POA: Diagnosis not present

## 2022-06-21 DIAGNOSIS — R6520 Severe sepsis without septic shock: Secondary | ICD-10-CM

## 2022-06-21 DIAGNOSIS — G9341 Metabolic encephalopathy: Secondary | ICD-10-CM | POA: Diagnosis not present

## 2022-06-21 DIAGNOSIS — J9601 Acute respiratory failure with hypoxia: Secondary | ICD-10-CM | POA: Diagnosis not present

## 2022-06-21 LAB — BASIC METABOLIC PANEL
Anion gap: 10 (ref 5–15)
BUN: 16 mg/dL (ref 8–23)
CO2: 26 mmol/L (ref 22–32)
Calcium: 9 mg/dL (ref 8.9–10.3)
Chloride: 99 mmol/L (ref 98–111)
Creatinine, Ser: 1.1 mg/dL — ABNORMAL HIGH (ref 0.44–1.00)
GFR, Estimated: 56 mL/min — ABNORMAL LOW (ref >60)
Glucose, Bld: 130 mg/dL — ABNORMAL HIGH (ref 70–99)
Potassium: 4.3 mmol/L (ref 3.5–5.1)
Sodium: 135 mmol/L (ref 135–145)

## 2022-06-21 LAB — GLUCOSE, CAPILLARY: Glucose-Capillary: 133 mg/dL — ABNORMAL HIGH (ref 70–99)

## 2022-06-21 LAB — URINE CULTURE: Culture: 10000 — AB

## 2022-06-21 LAB — HEMOGLOBIN A1C
Hgb A1c MFr Bld: 6.5 % — ABNORMAL HIGH (ref 4.8–5.6)
Mean Plasma Glucose: 140 mg/dL

## 2022-06-21 MED ORDER — OLMESARTAN MEDOXOMIL 20 MG PO TABS: 20.0000 mg | ORAL_TABLET | Freq: Every day | ORAL | Status: DC

## 2022-06-21 MED ORDER — AMOXICILLIN 500 MG PO CAPS
500.0000 mg | ORAL_CAPSULE | Freq: Three times a day (TID) | ORAL | Status: DC
  Administered 2022-06-21: 500 mg via ORAL
  Filled 2022-06-21 (×2): qty 1

## 2022-06-21 MED ORDER — AMOXICILLIN 500 MG PO CAPS
500.0000 mg | ORAL_CAPSULE | Freq: Three times a day (TID) | ORAL | 0 refills | Status: AC
  Filled 2022-06-21: qty 15, 5d supply, fill #0

## 2022-06-21 NOTE — Progress Notes (Signed)
Mobility Specialist Progress Note:    06/21/22 1000  Mobility  Activity Ambulated independently to bathroom;Transferred from bed to chair  Level of Assistance Independent  Assistive Device None  Distance Ambulated (ft) 15 ft  Activity Response Tolerated well  Mobility Referral Yes  $Mobility charge 1 Mobility   Pt was in the bathroom upon my arrival. Pt was agreeable to mobility session and ambulated from bathroom to the chair. Declined O2, SpO2 was monitored throughout session (96%). Left pt in chair with daughter in room and all needs met.   Royetta Crochet Mobility Specialist Please contact via Solicitor or  Rehab office at [phone]

## 2022-06-21 NOTE — Telephone Encounter (Signed)
Patient sister called to cancel reconstruction surgery on 1/18 stating patient has thought about it and decided not to proceed with reconstruction.   Case cx, [facility] notified since it was a joint case with Marlou Starks.

## 2022-06-21 NOTE — Progress Notes (Deleted)
Start Date:   M: 28f. BR>Chair. Ind. NoAd. T: 1.5 W:1 Th: F: S: Su:

## 2022-06-21 NOTE — Discharge Summary (Signed)
PATIENT DETAILS Name: [name] Age: 62 y.o. Sex: female Date of Birth: Apr 04, 1961 MRN: [id number]. Admitting Physician: Jonetta Osgood, MD MIW:OEHOZ, Thayer Jew, MD  Admit Date: 06/18/2022 Discharge date: 06/21/2022  Recommendations for Outpatient Follow-up:  Follow up with PCP in 1-2 weeks Please obtain CMP/CBC in one week And needs outpatient sleep study. Incidental finding-lung nodule-see recommendations by radiology below  Admitted From:  Home  Disposition: Home   Discharge Condition: fair  CODE STATUS:   Code Status: Full Code   Diet recommendation:  Diet Order             Diet - low sodium heart healthy           Diet Carb Modified           Diet heart healthy/carb modified Room service appropriate? Yes; Fluid consistency: Thin  Diet effective now                    Brief Summary: Patient is a 62 y.o.  female with history of HTN, HLD, DM-2, GERD, chronic pain syndrome on narcotics-recently diagnosed with bilateral breast cancer-presented with acute metabolic encephalopathy in the setting of pyelonephritis.   Significant events: 1/5>> admit to [facility] for encephalopathy in the setting of pyelonephritis.  Febrile on initial presentation.   Significant studies: 1/5>> x-ray right hip: No fracture/dislocation 1/5>> CXR: Interstitial prominence/bibasilar groundglass airspace disease 1/6>> CT renal stone study: Right-sided perinephric inflammatory fat stranding, mild left-sided hydronephrosis/hydroureter.  8 mm noncalcified lung nodule. 1/6>> CT head: No acute intracranial abnormality 1/6>> CT chest: 3 mm nodule right apex, mild heterogeneous groundglass attenuation throughout both lungs-reflecting small disease.  Subpleural interstitial reticulation within left upper lobe/left lower lobe-more confluent peripheral airspace disease in the left lower lobe. 1/7>> MRI right hip: Intramuscular edema of the right gluteus maximus muscle without evidence of hematoma  or fluid collection.  Findings suggest muscle tear/tear at the myotendinous junction.   Significant microbiology data: 1/5>> COVID/influenza/RSV PCR: Negative 1/5>> respiratory virus panel: Negative 1/5>> urine culture: 10,000 colonies/mL Enterococcus faecalis (pansensitive) 1/5>> blood culture: None   Procedures: None   Consults: None  Brief Hospital Course: Acute metabolic encephalopathy Likely due to pyelonephritis-possible PNA Resolved-completely awake and alert.    Severe sepsis Right-sided pyelonephritis Possible PNA UA clean-as patient already on Keflex x 2 days prior to this hospitalization. CT chest with some suggestion of PNA but she is completely asymptomatic-no cough/SOB. Initially treated with Rocephin/Zithromax-but suspect we could just transition to amoxicillin-suspect this is mostly UTI-CT chest findings of pneumonia mostly incidental and asymptomatic.   Right gluteal/right hip pain X-ray-CT renal stone study negative for any significant hip pathology Had discussed with radiology over the phone on 1/6-no fluid collection/effusion in right hip area. Since continued to complain of right gluteal pain-MRI was checked which showed findings suggestive of muscle strain/tear.  Continue supportive care-if pain continues (per patient pain somewhat better today) PCP to consider outpatient referral to orthopedics.   AKI Likely hemodynamically mediated-significant improved-almost normalized with supportive care.   ?  Hypoxia Appears to have mostly nocturnal hypoxia-on room air this morning.  Suspect she may have undiagnosed OSA. Continue to follow. Needs outpatient sleep study.   HTN BP slowly creeping up-but still stable. All antihypertensives were discontinued-as BP was soft-and patient was with AKI On discharge-patient will resume amlodipine and Benicar-continue to hold HCTZ.  Follow with PCP and optimize accordingly.   DM-2 CBG stable with SSI Resume oral  hypoglycemic agents on discharge.  Chronic  pain syndrome Once encephalopathy improves-patient was resumed on usual narcotic regimen/Lyrica.  Tolerating it without any issues.    8 mm lung nodule right lung base 3 mm nodule right apex Radiology recommending repeat CT chest x 1 year.   Recent diagnosis of bilateral breast cancer Scheduled for mastectomy-bilaterally on 1/18. Follows with [name].   Obesity: Estimated body mass index is 35.57 kg/m as calculated from the following:   Height as of this encounter: '5\' 6"'$  (1.676 m).   Weight as of this encounter: 100 kg.   Discharge Diagnoses:  Principal Problem:   Atypical pneumonia Active Problems:   Essential hypertension   Acute hypoxemic respiratory failure (HCC)   Severe sepsis (HCC)   AKI (acute kidney injury) ([place])   Acute pyelonephritis   Acute metabolic encephalopathy   Discharge Instructions:  Activity:  As tolerated with Full fall precautions use walker/cane & assistance as needed   Discharge Instructions     Call MD for:  difficulty breathing, headache or visual disturbances   Complete by: As directed    Call MD for:  persistant nausea and vomiting   Complete by: As directed    Diet - low sodium heart healthy   Complete by: As directed    Diet Carb Modified   Complete by: As directed    Discharge instructions   Complete by: As directed    Follow with Primary MD  Deland Pretty, MD in 1-2 weeks  Due to your blood pressure being low-and kidney failure-all your antihypertensives was discontinued.  Okay to resume amlodipine right away, resume olmesartan in a couple of days.  Continue to hold HCTZ until seen by your primary care practitioner.  Your CT chest showed some incidental lung nodules-please ask your primary care practitioner to repeat CT chest in 1 year.  In some cases-these can turn cancerous.  Please ask your primary care practitioner for a sleep study.  Please get a complete blood count and  chemistry panel checked by your Primary MD at your next visit, and again as instructed by your Primary MD.  Get Medicines reviewed and adjusted: Please take all your medications with you for your next visit with your Primary MD  Laboratory/radiological data: Please request your Primary MD to go over all hospital tests and procedure/radiological results at the follow up, please ask your Primary MD to get all Hospital records sent to his/her office.  In some cases, they will be blood work, cultures and biopsy results pending at the time of your discharge. Please request that your primary care M.D. follows up on these results.  Also Note the following: If you experience worsening of your admission symptoms, develop shortness of breath, life threatening emergency, suicidal or homicidal thoughts you must seek medical attention immediately by calling 911 or calling your MD immediately  if symptoms less severe.  You must read complete instructions/literature along with all the possible adverse reactions/side effects for all the Medicines you take and that have been prescribed to you. Take any new Medicines after you have completely understood and accpet all the possible adverse reactions/side effects.   Do not drive when taking Pain medications or sleeping medications (Benzodaizepines)  Do not take more than prescribed Pain, Sleep and Anxiety Medications. It is not advisable to combine anxiety,sleep and pain medications without talking with your primary care practitioner  Special Instructions: If you have smoked or chewed Tobacco  in the last 2 yrs please stop smoking, stop any regular Alcohol  and or any  Recreational drug use.  Wear Seat belts while driving.  Please note: You were cared for by a hospitalist during your hospital stay. Once you are discharged, your primary care physician will handle any further medical issues. Please note that NO REFILLS for any discharge medications will be  authorized once you are discharged, as it is imperative that you return to your primary care physician (or establish a relationship with a primary care physician if you do not have one) for your post hospital discharge needs so that they can reassess your need for medications and monitor your lab values.   Increase activity slowly   Complete by: As directed       Allergies as of 06/21/2022       Reactions   Codeine Phosphate Other (See Comments)   Gi upset   Simvastatin    REACTION: muscle' \\T'$ \ joint pains   Sulfamethoxazole    REACTION: unspecified        Medication List     STOP taking these medications    cephALEXin 500 MG capsule Commonly known as: KEFLEX   hydrochlorothiazide 12.5 MG tablet Commonly known as: HYDRODIURIL   ondansetron 4 MG tablet Commonly known as: Zofran       TAKE these medications    albuterol 108 (90 Base) MCG/ACT inhaler Commonly known as: VENTOLIN HFA Inhale 2 puffs into the lungs every 6 (six) hours as needed for wheezing or shortness of breath.   ALPRAZolam 0.25 MG tablet Commonly known as: XANAX Take 0.25 mg by mouth 2 (two) times daily as needed for anxiety.   amitriptyline 50 MG tablet Commonly known as: ELAVIL Take 50 mg by mouth at bedtime.   amLODipine 10 MG tablet Commonly known as: NORVASC Take 10 mg by mouth daily.   amoxicillin 500 MG capsule Commonly known as: AMOXIL Take 1 capsule (500 mg total) by mouth every 8 (eight) hours for 5 days.   ezetimibe 10 MG tablet Commonly known as: ZETIA Take 10 mg by mouth daily.   lidocaine 5 % Commonly known as: Lidoderm Place 3 patches onto the skin daily. Remove & Discard patch within 12 hours or as directed by MD What changed:  when to take this reasons to take this additional instructions   loratadine 10 MG tablet Commonly known as: CLARITIN Take 10 mg by mouth daily. For allergies   metFORMIN 500 MG 24 hr tablet Commonly known as: GLUCOPHAGE-XR Take 500 mg by  mouth 2 (two) times daily.   Mounjaro 7.5 MG/0.5ML Pen Generic drug: tirzepatide SMARTSIG:7.5 Milligram(s) SUB-Q Every 4 Weeks   olmesartan 20 MG tablet Commonly known as: BENICAR Take 1 tablet (20 mg total) by mouth daily. Start taking on: June 23, 2022 What changed: These instructions start on June 23, 2022. If you are unsure what to do until then, ask your doctor or other care provider.   pantoprazole 40 MG tablet Commonly known as: PROTONIX Take 40 mg by mouth daily.   pregabalin 100 MG capsule Commonly known as: LYRICA Take 100 mg by mouth 2 (two) times daily.   Xtampza ER 36 MG C12a Generic drug: oxyCODONE ER Take 36 mg by mouth in the morning and at bedtime.        Follow-up Information     Deland Pretty, MD. Schedule an appointment as soon as possible for a visit in 1 week(s).   Specialty: Internal Medicine Contact information: [address] [place] [address] [phone]  Allergies  Allergen Reactions   Codeine Phosphate Other (See Comments)    Gi upset   Simvastatin     REACTION: muscle' \\T'$ \ joint pains   Sulfamethoxazole     REACTION: unspecified     Other Procedures/Studies: MR HIP RIGHT WO CONTRAST  Result Date: 06/20/2022 EXAM: MR OF THE RIGHT HIP WITHOUT CONTRAST TECHNIQUE: Multiplanar, multisequence MR imaging was performed. No intravenous contrast was administered. COMPARISON:  None Available. FINDINGS: Bone No hip fracture, dislocation or avascular necrosis. No aggressive osseous lesion. SI joints are normal. No SI joint widening or erosive changes. Susceptibility artifact from lumbar spine hardware limits evaluation. Alignment Normal. No subluxation. Dysplasia None. Joint effusion No joint effusions. Labrum Degenerative changes of the labrum without evidence of tear. Cartilage Generalized articular cartilage thinning. No full-thickness defect or subchondral cystic changes. Capsule and ligaments Normal.  Muscles and Tendons Intramuscular edema of the gluteus maximus muscle without evidence of intramuscular hematoma or fluid collection. The findings suggest muscle strain/tear at the myotendinous junction. Other Findings No bursal fluid. Subcutaneous soft tissue edema about the lateral and posterior aspect of the right gluteal region. Viscera No abnormality seen in pelvis. No lymphadenopathy. No free fluid in the pelvis. IMPRESSION: 1. Intramuscular edema of the right gluteus maximus muscle without evidence of intramuscular hematoma or fluid collection. The findings suggest muscle strain/tear at the myotendinous junction. 2. No evidence of fracture or osteonecrosis. 3. Mild right hip osteoarthritis without evidence of joint effusion. Electronically Signed   By: Keane Police D.O.   On: 06/20/2022 23:06   CT CHEST WO CONTRAST  Result Date: 06/19/2022 CLINICAL DATA:  Evaluate lung nodule. EXAM: CT CHEST WITHOUT CONTRAST TECHNIQUE: Multidetector CT imaging of the chest was performed following the standard protocol without IV contrast. RADIATION DOSE REDUCTION: This exam was performed according to the departmental dose-optimization program which includes automated exposure control, adjustment of the mA and/or kV according to patient size and/or use of iterative reconstruction technique. COMPARISON:  CT AP from earlier today and coronary calcium score CT from 03/22/2022 FINDINGS: Cardiovascular: Heart size is upper limits of normal. No pericardial effusion. Aortic atherosclerosis. Mediastinum/Nodes: Thyroid gland, trachea, and esophagus are unremarkable. No axillary or mediastinal adenopathy. Hilar lymph nodes suboptimally evaluated due to lack of IV contrast. Lungs/Pleura: No pleural fluid identified. Mild heterogeneous ground-glass attenuation noted throughout both lungs. Asymmetric peripheral predominant subpleural interstitial reticulation is identified within the left upper lobe and left lower lobe. More confluent,  peripheral airspace disease is noted within the left lower lobe, image 118/8. -Perifissural nodule along the minor fissure which measures 0.9 x 0.5 cm, image 42/7 and image 77/8. This is unchanged from the exam from 03/22/2022 and likely represents a benign intrapulmonary lymph node. -Perifissural nodule along the minor fissure is also again seen measuring 0.8 x 0.3 cm, image 40/7 and image 78/8. -Unchanged scar like density within the anterior right middle lobe measuring 1.6 cm, image 76/8. -Tiny nodule within the right apex measures 3 mm, image 22/8. No prior imaging for comparison. Upper Abdomen: Diffuse hepatic steatosis. Status post cholecystectomy. Musculoskeletal: No chest wall mass or suspicious bone lesions identified. IMPRESSION: 1. Stable appearance of perifissural nodules along the minor fissure. These are favored to represent benign intrapulmonary lymph nodes. 2. Asymmetric peripheral predominant subpleural interstitial reticulation is identified within the left upper lobe and left lower lobe. More confluent, peripheral airspace disease is noted within the left lower lobe. Findings are inflammatory/infectious in etiology. 3. Mild heterogeneous ground-glass attenuation noted throughout both lungs. This  is a nonspecific finding and may reflect underlying small airways disease. 4. Hepatic steatosis. 5. 3 mm nodule within the right apex. No prior imaging for comparison. If the patient is at high risk for bronchogenic carcinoma, follow-up chest CT at 1year is recommended. If the patient is at low risk, no follow-up is needed. This recommendation follows the consensus statement: Guidelines for Management of Small Pulmonary Nodules Detected on CT Scans: A Statement from the [place] as published in Radiology 2005; 237:395-400. 6.  Aortic Atherosclerosis (ICD10-I70.0). Electronically Signed   By: Kerby Moors M.D.   On: 06/19/2022 11:48   CT Renal Stone Study  Result Date: 06/19/2022 CLINICAL  DATA:  Abdominal pain. EXAM: CT ABDOMEN AND PELVIS WITHOUT CONTRAST TECHNIQUE: Multidetector CT imaging of the abdomen and pelvis was performed following the standard protocol without IV contrast. RADIATION DOSE REDUCTION: This exam was performed according to the departmental dose-optimization program which includes automated exposure control, adjustment of the mA and/or kV according to patient size and/or use of iterative reconstruction technique. COMPARISON:  None Available. FINDINGS: Lower chest: An 8 mm, ill-defined noncalcified lung nodule is seen within the anterior aspect of the right lung base. Hepatobiliary: There is diffuse fatty infiltration of the liver parenchyma. No focal liver abnormality is seen. Status post cholecystectomy. No biliary dilatation. Pancreas: Unremarkable. No pancreatic ductal dilatation or surrounding inflammatory changes. Spleen: Normal in size without focal abnormality. Adrenals/Urinary Tract: Adrenal glands are unremarkable. The right kidney is atrophic in size. The left kidney is normal in size, without focal lesions. There is mild left-sided hydronephrosis and hydroureter, without evidence of renal calculi. Mild to moderate severity right-sided perinephric inflammatory fat stranding is noted. The urinary bladder is markedly distended and otherwise unremarkable. Stomach/Bowel: Stomach is within normal limits. Appendix appears normal. No evidence of bowel wall thickening, distention, or inflammatory changes. Vascular/Lymphatic: Aortic atherosclerosis. No enlarged abdominal or pelvic lymph nodes. Reproductive: Status post hysterectomy. No adnexal masses. Other: No abdominal wall hernia or abnormality. No abdominopelvic ascites. Musculoskeletal: Postoperative hardware seen within the mid to lower lumbar spine. No acute osseous abnormalities are identified. IMPRESSION: 1. Mild to moderate severity right-sided perinephric inflammatory fat stranding, which may represent sequelae  associated with acute pyelonephritis. Correlation with urinalysis is recommended. 2. Mild left-sided hydronephrosis and hydroureter, without evidence of renal calculi. 3. Atrophic right kidney. 4. 8 mm, ill-defined noncalcified lung nodule within the anterior aspect of the right lung base. Further evaluation with nonemergent chest CT is recommended to determine the presence or absence of additional pulmonary nodules. 5. Hepatic steatosis. 6. Evidence of prior cholecystectomy and hysterectomy. 7. Aortic atherosclerosis. Aortic Atherosclerosis (ICD10-I70.0). Electronically Signed   By: Virgina Norfolk M.D.   On: 06/19/2022 01:04   CT HEAD WO CONTRAST (5MM)  Result Date: 06/19/2022 CLINICAL DATA:  Altered mental status. EXAM: CT HEAD WITHOUT CONTRAST TECHNIQUE: Contiguous axial images were obtained from the base of the skull through the vertex without intravenous contrast. RADIATION DOSE REDUCTION: This exam was performed according to the departmental dose-optimization program which includes automated exposure control, adjustment of the mA and/or kV according to patient size and/or use of iterative reconstruction technique. COMPARISON:  July 13, 2020 FINDINGS: Brain: No evidence of acute infarction, hemorrhage, hydrocephalus, extra-axial collection or mass lesion/mass effect. Vascular: No hyperdense vessel or unexpected calcification. Skull: Normal. Negative for fracture or focal lesion. Sinuses/Orbits: No acute finding. Other: None. IMPRESSION: No acute intracranial pathology. Electronically Signed   By: Virgina Norfolk M.D.   On: 06/19/2022 01:01  DG Hip Unilat W or Wo Pelvis 2-3 Views Right  Result Date: 06/18/2022 CLINICAL DATA:  Hip pain EXAM: DG HIP (WITH OR WITHOUT PELVIS) 2-3V RIGHT COMPARISON:  None Available. FINDINGS: There is no evidence of hip fracture or dislocation. There is no evidence of arthropathy or other focal bone abnormality. Lumbar fusion hardware is partially visualized.  IMPRESSION: No acute fracture or dislocation. Electronically Signed   By: Ronney Asters M.D.   On: 06/18/2022 22:50   DG Chest Port 1 View  Result Date: 06/18/2022 CLINICAL DATA:  Sepsis, altered level of consciousness EXAM: PORTABLE CHEST 1 VIEW COMPARISON:  07/13/2020 FINDINGS: Single frontal view of the chest demonstrates a stable cardiac silhouette. Increased interstitial prominence greatest at the lung bases, with scattered areas of ground-glass airspace disease. No effusion or pneumothorax. No acute bony abnormality. IMPRESSION: 1. Interstitial prominence and basilar ground-glass airspace disease, which could reflect sequela of atypical viral pneumonia or edema. Electronically Signed   By: Randa Ngo M.D.   On: 06/18/2022 21:38   MM RT BREAST BX W LOC DEV EA AD LESION IMG BX SPEC STEREO GUIDE  Addendum Date: 05/28/2022   ADDENDUM REPORT: 05/28/2022 14:18 ADDENDUM: Pathology revealed GRADE I INVASIVE DUCTAL CARCINOMA, CALCIFICATIONS: PRESENT IN BENIGN BREAST TISSUE, OTHER FINDINGS: BACKGROUND COMPLEX SCLEROSING LESION of the RIGHT breast, upper outer quadrant, (ribbon clip). This was found to be concordant by Dr. Claudie Revering. Pathology revealed BENIGN BREAST TISSUE WITH SCLEROSING ADENOSIS, FIBROCYSTIC CHANGE, FOCAL USUAL DUCTAL HYPERPLASIA, AND EXTENSIVE ASSOCIATED CALCIFICATIONS of the RIGHT breast, lower outer quadrant, (coil clip). This was found to be concordant by Dr. Claudie Revering. Pathology results were discussed with the patient by telephone. The patient reported doing well after the biopsies with tenderness at the site. Post biopsy instructions and care were reviewed and questions were answered. The patient was encouraged to call [facility] for any additional concerns. My direct phone number was provided. The patient has a recent diagnosis of BILATERAL breast cancer and should follow her outlined treatment plan. The patient is scheduled for BILATERAL mastectomies  on July 01, 2022, Pathology results reported by Terie Purser, RN on 05/28/2022. Electronically Signed   By: Claudie Revering M.D.   On: 05/28/2022 14:18   Result Date: 05/28/2022 CLINICAL DATA:  Focal distortion in the upper-outer quadrant of the right breast and 7 mm group of calcifications in the lower outer quadrant of the right breast at recent mammography. Recent bilateral breast biopsies demonstrating malignancy. EXAM: RIGHT BREAST STEREOTACTIC CORE NEEDLE BIOPSY X 2 COMPARISON:  Previous exam(s). FINDINGS: The patient and I discussed the procedure of stereotactic-guided biopsy including benefits and alternatives. We discussed the high likelihood of successful procedures. We discussed the risks of the procedures including infection, bleeding, tissue injury, clip migration, and inadequate sampling. Informed written consent was given. The usual time out protocol was performed immediately prior to the procedures. SITE 1: FOCAL DISTORTION IN THE UPPER-OUTER QUADRANT OF THE RIGHT BREAST Using sterile technique and 1% Lidocaine as local anesthetic, under stereotactic guidance, a 9 gauge vacuum assisted device was used to perform core needle biopsy of the recently demonstrated focal distortion in the upper-outer quadrant of the right breast using a cephalad approach. Lesion quadrant: Upper outer quadrant At the conclusion of the procedure, a ribbon shaped tissue marker clip was deployed into the biopsy cavity. SITE 2: 7 MM GROUP OF CALCIFICATIONS IN THE LOWER OUTER QUADRANT OF THE RIGHT BREAST Using sterile technique and 1% Lidocaine as local anesthetic,  under stereotactic guidance, a 9 gauge vacuum assisted device was used to perform core needle biopsy of a 7 mm group of calcifications in the lower outer quadrant of the right breast using a cephalad approach. Specimen radiograph was performed showing calcifications in multiple specimens. Specimens with calcifications are identified for pathology. Lesion quadrant:  Lower outer quadrant At the conclusion of the procedure, a coil shaped tissue marker clip was deployed into the biopsy cavity. Follow-up 2-view mammogram was performed and dictated separately. IMPRESSION: Stereotactic-guided biopsy of right breast distortion and right breast calcifications. No apparent complications. Please insert correct template. Electronically Signed: By: Claudie Revering M.D. On: 05/26/2022 08:57  MM RT BREAST BX W LOC DEV 1ST LESION IMAGE BX SPEC STEREO GUIDE  Addendum Date: 05/28/2022   ADDENDUM REPORT: 05/28/2022 14:17 ADDENDUM: Pathology revealed GRADE I INVASIVE DUCTAL CARCINOMA, CALCIFICATIONS: PRESENT IN BENIGN BREAST TISSUE, OTHER FINDINGS: BACKGROUND COMPLEX SCLEROSING LESION of the RIGHT breast, upper outer quadrant, (ribbon clip). This was found to be concordant by Dr. Claudie Revering. Pathology revealed BENIGN BREAST TISSUE WITH SCLEROSING ADENOSIS, FIBROCYSTIC CHANGE, FOCAL USUAL DUCTAL HYPERPLASIA, AND EXTENSIVE ASSOCIATED CALCIFICATIONS of the RIGHT breast, lower outer quadrant, (coil clip). This was found to be concordant by Dr. Claudie Revering. Pathology results were discussed with the patient by telephone. The patient reported doing well after the biopsies with tenderness at the site. Post biopsy instructions and care were reviewed and questions were answered. The patient was encouraged to call [facility] for any additional concerns. My direct phone number was provided. The patient has a recent diagnosis of BILATERAL breast cancer and should follow her outlined treatment plan. The patient is scheduled for BILATERAL mastectomies on July 01, 2022, Pathology results reported by Terie Purser, RN on 05/28/2022. Electronically Signed   By: Claudie Revering M.D.   On: 05/28/2022 14:17   Result Date: 05/28/2022 CLINICAL DATA:  Focal distortion in the upper-outer quadrant of the right breast and 7 mm group of calcifications in the lower outer quadrant of the  right breast at recent mammography. Recent bilateral breast biopsies demonstrating malignancy. EXAM: RIGHT BREAST STEREOTACTIC CORE NEEDLE BIOPSY X 2 COMPARISON:  Previous exam(s). FINDINGS: The patient and I discussed the procedure of stereotactic-guided biopsy including benefits and alternatives. We discussed the high likelihood of successful procedures. We discussed the risks of the procedures including infection, bleeding, tissue injury, clip migration, and inadequate sampling. Informed written consent was given. The usual time out protocol was performed immediately prior to the procedures. SITE 1: FOCAL DISTORTION IN THE UPPER-OUTER QUADRANT OF THE RIGHT BREAST Using sterile technique and 1% Lidocaine as local anesthetic, under stereotactic guidance, a 9 gauge vacuum assisted device was used to perform core needle biopsy of the recently demonstrated focal distortion in the upper-outer quadrant of the right breast using a cephalad approach. Lesion quadrant: Upper outer quadrant At the conclusion of the procedure, a ribbon shaped tissue marker clip was deployed into the biopsy cavity. SITE 2: 7 MM GROUP OF CALCIFICATIONS IN THE LOWER OUTER QUADRANT OF THE RIGHT BREAST Using sterile technique and 1% Lidocaine as local anesthetic, under stereotactic guidance, a 9 gauge vacuum assisted device was used to perform core needle biopsy of a 7 mm group of calcifications in the lower outer quadrant of the right breast using a cephalad approach. Specimen radiograph was performed showing calcifications in multiple specimens. Specimens with calcifications are identified for pathology. Lesion quadrant: Lower outer quadrant At the conclusion of the procedure, a coil  shaped tissue marker clip was deployed into the biopsy cavity. Follow-up 2-view mammogram was performed and dictated separately. IMPRESSION: Stereotactic-guided biopsy of right breast distortion and right breast calcifications. No apparent complications.  Electronically Signed: By: Claudie Revering M.D. On: 05/26/2022 08:47  MM CLIP PLACEMENT RIGHT  Result Date: 05/26/2022 CLINICAL DATA:  Status post 3D stereotactic guided core needle biopsy of focal distortion in the upper-outer quadrant of the right breast marked with a ribbon shaped biopsy marker clip and 3D stereotactic guided core needle biopsy of a 7 mm group of calcifications in the lower outer quadrant of the right breast marked with a coil shaped biopsy marker clip. EXAM: 3D DIAGNOSTIC RIGHT MAMMOGRAM POST STEREOTACTIC BIOPSY X 2 COMPARISON:  Previous exam(s). FINDINGS: 3D Mammographic images were obtained following 3D stereotactic guided biopsy of focal distortion in the upper-outer quadrant of the right breast and 7 mm group of calcifications in the lower outer quadrant of the right breast. The biopsy marking clips are in expected positions at the sites of biopsy. IMPRESSION: Appropriate positioning of the ribbon shaped biopsy marking clip at the site of biopsy in the upper-outer quadrant of the right breast at the location of the biopsied distortion and appropriate positioning of the coil shaped biopsy marker clip at the location of the 7 mm group of calcifications biopsied in the lower outer right breast. Final Assessment: Post Procedure Mammograms for Marker Placement Electronically Signed   By: Claudie Revering M.D.   On: 05/26/2022 08:56    TODAY-DAY OF DISCHARGE:  Subjective:   Theodora Blow today has no headache,no chest abdominal pain,no new weakness tingling or numbness, feels much better wants to go home today.  Objective:   Blood pressure 135/84, pulse 83, temperature 98.7 F (37.1 C), temperature source Oral, resp. rate 20, height '5\' 6"'$  (1.676 m), weight 100 kg, last menstrual period 04/22/1997, SpO2 93 %.  Intake/Output Summary (Last 24 hours) at 06/21/2022 1013 Last data filed at 06/20/2022 1230 Gross per 24 hour  Intake 400 ml  Output --  Net 400 ml   Filed Weights   06/18/22  2108  Weight: 100 kg    Exam: Awake Alert, Oriented *3, No new F.N deficits, Normal affect [place].AT,PERRAL Supple Neck,No JVD, No cervical lymphadenopathy appriciated.  Symmetrical Chest wall movement, Good air movement bilaterally, CTAB RRR,No Gallops,Rubs or new Murmurs, No Parasternal Heave +ve B.Sounds, Abd Soft, Non tender, No organomegaly appriciated, No rebound -guarding or rigidity. No Cyanosis, Clubbing or edema, No new Rash or bruise   PERTINENT RADIOLOGIC STUDIES: MR HIP RIGHT WO CONTRAST  Result Date: 06/20/2022 EXAM: MR OF THE RIGHT HIP WITHOUT CONTRAST TECHNIQUE: Multiplanar, multisequence MR imaging was performed. No intravenous contrast was administered. COMPARISON:  None Available. FINDINGS: Bone No hip fracture, dislocation or avascular necrosis. No aggressive osseous lesion. SI joints are normal. No SI joint widening or erosive changes. Susceptibility artifact from lumbar spine hardware limits evaluation. Alignment Normal. No subluxation. Dysplasia None. Joint effusion No joint effusions. Labrum Degenerative changes of the labrum without evidence of tear. Cartilage Generalized articular cartilage thinning. No full-thickness defect or subchondral cystic changes. Capsule and ligaments Normal. Muscles and Tendons Intramuscular edema of the gluteus maximus muscle without evidence of intramuscular hematoma or fluid collection. The findings suggest muscle strain/tear at the myotendinous junction. Other Findings No bursal fluid. Subcutaneous soft tissue edema about the lateral and posterior aspect of the right gluteal region. Viscera No abnormality seen in pelvis. No lymphadenopathy. No free fluid in the pelvis. IMPRESSION: 1.  Intramuscular edema of the right gluteus maximus muscle without evidence of intramuscular hematoma or fluid collection. The findings suggest muscle strain/tear at the myotendinous junction. 2. No evidence of fracture or osteonecrosis. 3. Mild right hip osteoarthritis  without evidence of joint effusion. Electronically Signed   By: Keane Police D.O.   On: 06/20/2022 23:06   CT CHEST WO CONTRAST  Result Date: 06/19/2022 CLINICAL DATA:  Evaluate lung nodule. EXAM: CT CHEST WITHOUT CONTRAST TECHNIQUE: Multidetector CT imaging of the chest was performed following the standard protocol without IV contrast. RADIATION DOSE REDUCTION: This exam was performed according to the departmental dose-optimization program which includes automated exposure control, adjustment of the mA and/or kV according to patient size and/or use of iterative reconstruction technique. COMPARISON:  CT AP from earlier today and coronary calcium score CT from 03/22/2022 FINDINGS: Cardiovascular: Heart size is upper limits of normal. No pericardial effusion. Aortic atherosclerosis. Mediastinum/Nodes: Thyroid gland, trachea, and esophagus are unremarkable. No axillary or mediastinal adenopathy. Hilar lymph nodes suboptimally evaluated due to lack of IV contrast. Lungs/Pleura: No pleural fluid identified. Mild heterogeneous ground-glass attenuation noted throughout both lungs. Asymmetric peripheral predominant subpleural interstitial reticulation is identified within the left upper lobe and left lower lobe. More confluent, peripheral airspace disease is noted within the left lower lobe, image 118/8. -Perifissural nodule along the minor fissure which measures 0.9 x 0.5 cm, image 42/7 and image 77/8. This is unchanged from the exam from 03/22/2022 and likely represents a benign intrapulmonary lymph node. -Perifissural nodule along the minor fissure is also again seen measuring 0.8 x 0.3 cm, image 40/7 and image 78/8. -Unchanged scar like density within the anterior right middle lobe measuring 1.6 cm, image 76/8. -Tiny nodule within the right apex measures 3 mm, image 22/8. No prior imaging for comparison. Upper Abdomen: Diffuse hepatic steatosis. Status post cholecystectomy. Musculoskeletal: No chest wall mass or  suspicious bone lesions identified. IMPRESSION: 1. Stable appearance of perifissural nodules along the minor fissure. These are favored to represent benign intrapulmonary lymph nodes. 2. Asymmetric peripheral predominant subpleural interstitial reticulation is identified within the left upper lobe and left lower lobe. More confluent, peripheral airspace disease is noted within the left lower lobe. Findings are inflammatory/infectious in etiology. 3. Mild heterogeneous ground-glass attenuation noted throughout both lungs. This is a nonspecific finding and may reflect underlying small airways disease. 4. Hepatic steatosis. 5. 3 mm nodule within the right apex. No prior imaging for comparison. If the patient is at high risk for bronchogenic carcinoma, follow-up chest CT at 1year is recommended. If the patient is at low risk, no follow-up is needed. This recommendation follows the consensus statement: Guidelines for Management of Small Pulmonary Nodules Detected on CT Scans: A Statement from the [employer] as published in Radiology 2005; 237:395-400. 6.  Aortic Atherosclerosis (ICD10-I70.0). Electronically Signed   By: Kerby Moors M.D.   On: 06/19/2022 11:48     PERTINENT LAB RESULTS: CBC: Recent Labs    06/19/22 0749 06/20/22 0336  WBC 7.1 6.8  HGB 10.3* 10.6*  HCT 35.1* 33.3*  PLT 155 187   CMET CMP     Component Value Date/Time   NA 135 06/21/2022 0743   K 4.3 06/21/2022 0743   CL 99 06/21/2022 0743   CO2 26 06/21/2022 0743   GLUCOSE 130 (H) 06/21/2022 0743   GLUCOSE 109 (H) 06/10/2006 1007   BUN 16 06/21/2022 0743   CREATININE 1.10 (H) 06/21/2022 0743   CREATININE 1.17 (H) 05/26/2022 1209   CALCIUM  9.0 06/21/2022 0743   PROT 5.9 (L) 06/19/2022 0749   ALBUMIN 3.0 (L) 06/19/2022 0749   AST 87 (H) 06/19/2022 0749   AST 21 05/26/2022 1209   ALT 50 (H) 06/19/2022 0749   ALT 30 05/26/2022 1209   ALKPHOS 45 06/19/2022 0749   BILITOT 1.0 06/19/2022 0749   BILITOT 0.2 (L)  05/26/2022 1209   GFRNONAA 56 (L) 06/21/2022 0743   GFRNONAA 52 (L) 05/26/2022 1209   GFRAA  08/11/2010 0505    >60        The eGFR has been calculated using the MDRD equation. This calculation has not been validated in all clinical situations. eGFR's persistently <60 mL/min signify possible Chronic Kidney Disease.    GFR Estimated Creatinine Clearance: 62.5 mL/min (A) (by C-G formula based on SCr of 1.1 mg/dL (H)). No results for input(s): "LIPASE", "AMYLASE" in the last 72 hours. No results for input(s): "CKTOTAL", "CKMB", "CKMBINDEX", "TROPONINI" in the last 72 hours. Invalid input(s): "POCBNP" No results for input(s): "DDIMER" in the last 72 hours. No results for input(s): "HGBA1C" in the last 72 hours. No results for input(s): "CHOL", "HDL", "LDLCALC", "TRIG", "CHOLHDL", "LDLDIRECT" in the last 72 hours. No results for input(s): "TSH", "T4TOTAL", "T3FREE", "THYROIDAB" in the last 72 hours.  Invalid input(s): "FREET3" No results for input(s): "VITAMINB12", "FOLATE", "FERRITIN", "TIBC", "IRON", "RETICCTPCT" in the last 72 hours. Coags: Recent Labs    06/18/22 2139  INR 1.1   Microbiology: Recent Results (from the past 240 hour(s))  Resp panel by RT-PCR (RSV, Flu A&B, Covid)     Status: None   Collection Time: 06/18/22  9:28 PM   Specimen: Nasal Swab  Result Value Ref Range Status   SARS Coronavirus 2 by RT PCR NEGATIVE NEGATIVE Final    Comment: (NOTE) SARS-CoV-2 target nucleic acids are NOT DETECTED.  The SARS-CoV-2 RNA is generally detectable in upper respiratory specimens during the acute phase of infection. The lowest concentration of SARS-CoV-2 viral copies this assay can detect is 138 copies/mL. A negative result does not preclude SARS-Cov-2 infection and should not be used as the sole basis for treatment or other patient management decisions. A negative result may occur with  improper specimen collection/handling, submission of specimen other than  nasopharyngeal swab, presence of viral mutation(s) within the areas targeted by this assay, and inadequate number of viral copies(<138 copies/mL). A negative result must be combined with clinical observations, patient history, and epidemiological information. The expected result is Negative.  Fact Sheet for Patients:  EntrepreneurPulse.com.au  Fact Sheet for Healthcare Providers:  IncredibleEmployment.be  This test is no t yet approved or cleared by the [place] FDA and  has been authorized for detection and/or diagnosis of SARS-CoV-2 by FDA under an Emergency Use Authorization (EUA). This EUA will remain  in effect (meaning this test can be used) for the duration of the COVID-19 declaration under Section 564(b)(1) of the Act, 21 U.S.C.section 360bbb-3(b)(1), unless the authorization is terminated  or revoked sooner.       Influenza A by PCR NEGATIVE NEGATIVE Final   Influenza B by PCR NEGATIVE NEGATIVE Final    Comment: (NOTE) The Xpert Xpress SARS-CoV-2/FLU/RSV plus assay is intended as an aid in the diagnosis of influenza from Nasopharyngeal swab specimens and should not be used as a sole basis for treatment. Nasal washings and aspirates are unacceptable for Xpert Xpress SARS-CoV-2/FLU/RSV testing.  Fact Sheet for Patients: EntrepreneurPulse.com.au  Fact Sheet for Healthcare Providers: IncredibleEmployment.be  This test is not yet approved  or cleared by the [place] and has been authorized for detection and/or diagnosis of SARS-CoV-2 by FDA under an Emergency Use Authorization (EUA). This EUA will remain in effect (meaning this test can be used) for the duration of the COVID-19 declaration under Section 564(b)(1) of the Act, 21 U.S.C. section 360bbb-3(b)(1), unless the authorization is terminated or revoked.     Resp Syncytial Virus by PCR NEGATIVE NEGATIVE Final    Comment:  (NOTE) Fact Sheet for Patients: EntrepreneurPulse.com.au  Fact Sheet for Healthcare Providers: IncredibleEmployment.be  This test is not yet approved or cleared by the [place] FDA and has been authorized for detection and/or diagnosis of SARS-CoV-2 by FDA under an Emergency Use Authorization (EUA). This EUA will remain in effect (meaning this test can be used) for the duration of the COVID-19 declaration under Section 564(b)(1) of the Act, 21 U.S.C. section 360bbb-3(b)(1), unless the authorization is terminated or revoked.  Performed at [facility], [place] [address], [place], [place] [address]   Blood Culture (routine x 2)     Status: None (Preliminary result)   Collection Time: 06/18/22  9:39 PM   Specimen: BLOOD  Result Value Ref Range Status   Specimen Description BLOOD BLOOD RIGHT WRIST  Final   Special Requests   Final    BOTTLES DRAWN AEROBIC AND ANAEROBIC Blood Culture adequate volume   Culture   Final    NO GROWTH 3 DAYS Performed at [facility], [place] [address] [place], [place] [address]    Report Status PENDING  Incomplete  Blood Culture (routine x 2)     Status: None (Preliminary result)   Collection Time: 06/18/22  9:46 PM   Specimen: BLOOD RIGHT HAND  Result Value Ref Range Status   Specimen Description BLOOD RIGHT HAND  Final   Special Requests   Final    BOTTLES DRAWN AEROBIC AND ANAEROBIC Blood Culture adequate volume   Culture   Final    NO GROWTH 3 DAYS Performed at [facility], [place] [address] [place], [place] [address]    Report Status PENDING  Incomplete  Respiratory (~20 pathogens) panel by PCR     Status: None   Collection Time: 06/19/22 11:00 AM   Specimen: Nasopharyngeal Swab; Respiratory  Result Value Ref Range Status   Adenovirus NOT DETECTED NOT DETECTED Final   Coronavirus 229E NOT DETECTED NOT DETECTED Final    Comment: (NOTE) The Coronavirus on the Respiratory Panel, DOES NOT  test for the novel  Coronavirus (2019 nCoV)    Coronavirus HKU1 NOT DETECTED NOT DETECTED Final   Coronavirus NL63 NOT DETECTED NOT DETECTED Final   Coronavirus OC43 NOT DETECTED NOT DETECTED Final   Metapneumovirus NOT DETECTED NOT DETECTED Final   Rhinovirus / Enterovirus NOT DETECTED NOT DETECTED Final   Influenza A NOT DETECTED NOT DETECTED Final   Influenza B NOT DETECTED NOT DETECTED Final   Parainfluenza Virus 1 NOT DETECTED NOT DETECTED Final   Parainfluenza Virus 2 NOT DETECTED NOT DETECTED Final   Parainfluenza Virus 3 NOT DETECTED NOT DETECTED Final   Parainfluenza Virus 4 NOT DETECTED NOT DETECTED Final   Respiratory Syncytial Virus NOT DETECTED NOT DETECTED Final   Bordetella pertussis NOT DETECTED NOT DETECTED Final   Bordetella Parapertussis NOT DETECTED NOT DETECTED Final   Chlamydophila pneumoniae NOT DETECTED NOT DETECTED Final   Mycoplasma pneumoniae NOT DETECTED NOT DETECTED Final    Comment: Performed at [facility], [place]. [address]  [place], [place] [address]  Urine Culture     Status: Abnormal   Collection Time: 06/19/22 11:06 AM   Specimen: In/Out Cath Urine  Result Value Ref Range Status   Specimen Description IN/OUT CATH URINE  Final   Special Requests   Final    NONE Performed at [facility], [place] [address], [place], [place] [address]    Culture 10,000 COLONIES/mL ENTEROCOCCUS FAECALIS (A)  Final   Report Status 06/21/2022 FINAL  Final   Organism ID, Bacteria ENTEROCOCCUS FAECALIS (A)  Final      Susceptibility   Enterococcus faecalis - MIC*    AMPICILLIN <=2 SENSITIVE Sensitive     NITROFURANTOIN <=16 SENSITIVE Sensitive     VANCOMYCIN 1 SENSITIVE Sensitive     * 10,000 COLONIES/mL ENTEROCOCCUS FAECALIS    FURTHER DISCHARGE INSTRUCTIONS:  Get Medicines reviewed and adjusted: Please take all your medications with you for your next visit with your Primary MD  Laboratory/radiological data: Please request your Primary MD to go over  all hospital tests and procedure/radiological results at the follow up, please ask your Primary MD to get all Hospital records sent to his/her office.  In some cases, they will be blood work, cultures and biopsy results pending at the time of your discharge. Please request that your primary care M.D. goes through all the records of your hospital data and follows up on these results.  Also Note the following: If you experience worsening of your admission symptoms, develop shortness of breath, life threatening emergency, suicidal or homicidal thoughts you must seek medical attention immediately by calling 911 or calling your MD immediately  if symptoms less severe.  You must read complete instructions/literature along with all the possible adverse reactions/side effects for all the Medicines you take and that have been prescribed to you. Take any new Medicines after you have completely understood and accpet all the possible adverse reactions/side effects.   Do not drive when taking Pain medications or sleeping medications (Benzodaizepines)  Do not take more than prescribed Pain, Sleep and Anxiety Medications. It is not advisable to combine anxiety,sleep and pain medications without talking with your primary care practitioner  Special Instructions: If you have smoked or chewed Tobacco  in the last 2 yrs please stop smoking, stop any regular Alcohol  and or any Recreational drug use.  Wear Seat belts while driving.  Please note: You were cared for by a hospitalist during your hospital stay. Once you are discharged, your primary care physician will handle any further medical issues. Please note that NO REFILLS for any discharge medications will be authorized once you are discharged, as it is imperative that you return to your primary care physician (or establish a relationship with a primary care physician if you do not have one) for your post hospital discharge needs so that they can reassess your need  for medications and monitor your lab values.  Total Time spent coordinating discharge including counseling, education and face to face time equals greater than 30 minutes.  SignedOren Binet 06/21/2022 10:13 AM

## 2022-06-22 ENCOUNTER — Encounter: Payer: Self-pay | Admitting: *Deleted

## 2022-06-22 ENCOUNTER — Telehealth: Payer: Self-pay

## 2022-06-22 LAB — LEGIONELLA PNEUMOPHILA SEROGP 1 UR AG: L. pneumophila Serogp 1 Ur Ag: NEGATIVE

## 2022-06-22 NOTE — Telephone Encounter (Signed)
Pt called and stated she will most likely need to put off her surgery with Dr. Marlou Starks on the 07-01-22 due to just leaving the hospital with pneumonia. She had attempted to call the surgical office yesterday and left a message with Dr. Gwendolyn Fill nurse. Rn [name] encouraged  pt to call office back again today and let them know again. Rn will inform Dr. Isidore Moos of this change as well.

## 2022-06-22 NOTE — Pre-Procedure Instructions (Signed)
Surgical Instructions    Your procedure is scheduled on Thursday 07/01/22.   Report to [facility] Main Entrance "A" at 06:30 A.M., then check in with the Admitting office.  Call this number if you have problems the morning of surgery:  [phone]   If you have any questions prior to your surgery date call [phone]: Open Monday-Friday 8am-4pm If you experience any cold or flu symptoms such as cough, fever, chills, shortness of breath, etc. between now and your scheduled surgery, please notify us at the above number     Remember:  Do not eat after midnight the night before your surgery  You may drink clear liquids until 05:30 A.M. the morning of your surgery.   Clear liquids allowed are: Water, Non-Citrus Juices (without pulp), Carbonated Beverages, Clear Tea, Black Coffee ONLY (NO MILK, CREAM OR POWDERED CREAMER of any kind), and Gatorade    Take these medicines the morning of surgery with A SIP OF WATER:   amLODipine (NORVASC)   ezetimibe (ZETIA)   pantoprazole (PROTONIX)   pregabalin (LYRICA)      Take these medicines if needed:   albuterol (VENTOLIN HFA)   ALPRAZolam (XANAX)   loratadine (CLARITIN)   oxyCODONE ER    As of today, STOP taking any Aspirin (unless otherwise instructed by your surgeon) Aleve, Naproxen, Ibuprofen, Motrin, Advil, Goody's, BC's, all herbal medications, fish oil, and all vitamins.  WHAT DO I DO ABOUT MY DIABETES MEDICATION?   Do not take oral diabetes medicines (pills) the morning of surgery.  DO NOT TAKE metFORMIN (GLUCOPHAGE-XR) the morning of surgery.   DO NOT TAKE MOUNJARO seven days prior to surgery. Do not take Mounjaro after 06/23/22.  HOW TO MANAGE YOUR DIABETES BEFORE AND AFTER SURGERY  Why is it important to control my blood sugar before and after surgery? Improving blood sugar levels before and after surgery helps healing and can limit problems. A way of improving blood sugar control is eating a healthy diet by:  Eating less  sugar and carbohydrates  Increasing activity/exercise  Talking with your doctor about reaching your blood sugar goals High blood sugars (greater than 180 mg/dL) can raise your risk of infections and slow your recovery, so you will need to focus on controlling your diabetes during the weeks before surgery. Make sure that the doctor who takes care of your diabetes knows about your planned surgery including the date and location.  How do I manage my blood sugar before surgery? Check your blood sugar at least 4 times a day, starting 2 days before surgery, to make sure that the level is not too high or low.  Check your blood sugar the morning of your surgery when you wake up and every 2 hours until you get to the Short Stay unit.  If your blood sugar is less than 70 mg/dL, you will need to treat for low blood sugar: Do not take insulin. Treat a low blood sugar (less than 70 mg/dL) with  cup of clear juice (cranberry or apple), 4 glucose tablets, OR glucose gel. Recheck blood sugar in 15 minutes after treatment (to make sure it is greater than 70 mg/dL). If your blood sugar is not greater than 70 mg/dL on recheck, call [phone] for further instructions. Report your blood sugar to the short stay nurse when you get to Short Stay.  If you are admitted to the hospital after surgery: Your blood sugar will be checked by the staff and you will probably be given insulin after  surgery (instead of oral diabetes medicines) to make sure you have good blood sugar levels. The goal for blood sugar control after surgery is 80-180 mg/dL.            Do not wear jewelry or makeup. Do not wear lotions, powders, perfumes/cologne or deodorant. Do not shave 48 hours prior to surgery.  Men may shave face and neck. Do not bring valuables to the hospital. Do not wear nail polish, gel polish, artificial nails, or any other type of covering on natural nails (fingers and toes) If you have artificial nails or gel  coating that need to be removed by a nail salon, please have this removed prior to surgery. Artificial nails or gel coating may interfere with anesthesia's ability to adequately monitor your vital signs.  [facility] is not responsible for any belongings or valuables.    Do NOT Smoke (Tobacco/Vaping)  24 hours prior to your procedure  If you use a CPAP at night, you may bring your mask for your overnight stay.   Contacts, glasses, hearing aids, dentures or partials may not be worn into surgery, please bring cases for these belongings   For patients admitted to the hospital, discharge time will be determined by your treatment team.   Patients discharged the day of surgery will not be allowed to drive home, and someone needs to stay with them for 24 hours.   SURGICAL WAITING ROOM VISITATION Patients having surgery or a procedure may have no more than 2 support people in the waiting area - these visitors may rotate.   Children under the age of 96 must have an adult with them who is not the patient. If the patient needs to stay at the hospital during part of their recovery, the visitor guidelines for inpatient rooms apply. Pre-op nurse will coordinate an appropriate time for 1 support person to accompany patient in pre-op.  This support person may not rotate.   Please refer to [email or web address] for the visitor guidelines for Inpatients (after your surgery is over and you are in a regular room).    Special instructions:    Oral Hygiene is also important to reduce your risk of infection.  Remember - BRUSH YOUR TEETH THE MORNING OF SURGERY WITH YOUR REGULAR TOOTHPASTE   [facility]- Preparing For Surgery  Before surgery, you can play an important role. Because skin is not sterile, your skin needs to be as free of germs as possible. You can reduce the number of germs on your skin by washing with CHG (chlorahexidine gluconate) Soap before  surgery.  CHG is an antiseptic cleaner which kills germs and bonds with the skin to continue killing germs even after washing.     Please do not use if you have an allergy to CHG or antibacterial soaps. If your skin becomes reddened/irritated stop using the CHG.  Do not shave (including legs and underarms) for at least 48 hours prior to first CHG shower. It is OK to shave your face.  Please follow these instructions carefully.     Shower the NIGHT BEFORE SURGERY and the MORNING OF SURGERY with CHG Soap.   If you chose to wash your hair, wash your hair first as usual with your normal shampoo. After you shampoo, rinse your hair and body thoroughly to remove the shampoo.  Then [employer] and genitals (private parts) with your normal soap and rinse thoroughly to remove soap.  After that Use CHG Soap as you would any other liquid soap.  You can apply CHG directly to the skin and wash gently with a scrungie or a clean washcloth.   Apply the CHG Soap to your body ONLY FROM THE NECK DOWN.  Do not use on open wounds or open sores. Avoid contact with your eyes, ears, mouth and genitals (private parts). Wash Face and genitals (private parts)  with your normal soap.   Wash thoroughly, paying special attention to the area where your surgery will be performed.  Thoroughly rinse your body with warm water from the neck down.  DO NOT shower/wash with your normal soap after using and rinsing off the CHG Soap.  Pat yourself dry with a CLEAN TOWEL.  Wear CLEAN PAJAMAS to bed the night before surgery  Place CLEAN SHEETS on your bed the night before your surgery  DO NOT SLEEP WITH PETS.   Day of Surgery:  Take a shower with CHG soap. Wear Clean/Comfortable clothing the morning of surgery Do not apply any deodorants/lotions.   Remember to brush your teeth WITH YOUR REGULAR TOOTHPASTE.    If you received a COVID test during your pre-op visit, it is requested that you wear a mask when out in public,  stay away from anyone that may not be feeling well, and notify your surgeon if you develop symptoms. If you have been in contact with anyone that has tested positive in the last 10 days, please notify your surgeon.    Please read over the following fact sheets that you were given.

## 2022-06-23 ENCOUNTER — Inpatient Hospital Stay (HOSPITAL_COMMUNITY)
Admission: RE | Admit: 2022-06-23 | Discharge: 2022-06-23 | Disposition: A | Discharge status: Home / Self Care | Payer: BC Managed Care – PPO | Source: Ambulatory Visit

## 2022-06-23 LAB — CULTURE, BLOOD (ROUTINE X 2)
Culture: NO GROWTH
Culture: NO GROWTH
Special Requests: ADEQUATE
Special Requests: ADEQUATE

## 2022-06-28 DIAGNOSIS — C50912 Malignant neoplasm of unspecified site of left female breast: Secondary | ICD-10-CM | POA: Diagnosis not present

## 2022-07-01 ENCOUNTER — Encounter (HOSPITAL_COMMUNITY): Payer: BC Managed Care – PPO

## 2022-07-08 ENCOUNTER — Encounter: Payer: Self-pay | Admitting: *Deleted

## 2022-07-09 ENCOUNTER — Encounter: Payer: BC Managed Care – PPO | Admitting: Plastic Surgery

## 2022-07-12 ENCOUNTER — Encounter (HOSPITAL_COMMUNITY): Payer: Self-pay

## 2022-07-12 NOTE — Pre-Procedure Instructions (Signed)
Surgical Instructions    Your procedure is scheduled on Monday, February 5th.  Report to [facility] Main Entrance "A" at 08:00 A.M., then check in with the Admitting office.  Call this number if you have problems the morning of surgery:  [phone]  If you have any questions prior to your surgery date call [phone]: Open Monday-Friday 8am-4pm If you experience any cold or flu symptoms such as cough, fever, chills, shortness of breath, etc. between now and your scheduled surgery, please notify us at the above number.     Remember:  Do not eat after midnight the night before your surgery  You may drink clear liquids until 07:00 AM the morning of your surgery.   Clear liquids allowed are: Water, Non-Citrus Juices (without pulp), Carbonated Beverages, Clear Tea, Black Coffee Only (NO MILK, CREAM OR POWDERED CREAMER of any kind), and Gatorade.    Take these medicines the morning of surgery with A SIP OF WATER  amLODipine (NORVASC)  ezetimibe (ZETIA)  loratadine (CLARITIN)  oxyCODONE ER (XTAMPZA ER)  pantoprazole (PROTONIX)  pregabalin (LYRICA)    If needed: albuterol (VENTOLIN HFA)- if needed, bring with you on day of surgery  ALPRAZolam Duanne Moron)     As of today, STOP taking any Aspirin (unless otherwise instructed by your surgeon) Aleve, Naproxen, Ibuprofen, Motrin, Advil, Goody's, BC's, all herbal medications, fish oil, and all vitamins.  WHAT DO I DO ABOUT MY DIABETES MEDICATION?   Do not take metFORMIN (GLUCOPHAGE-XR) the morning of surgery.  HOLD MOUNJARO 7 DAYS PRIOR TO SURGERY.         HOW TO MANAGE YOUR DIABETES BEFORE AND AFTER SURGERY  Why is it important to control my blood sugar before and after surgery? Improving blood sugar levels before and after surgery helps healing and can limit problems. A way of improving blood sugar control is eating a healthy diet by:  Eating less sugar and carbohydrates  Increasing activity/exercise  Talking with your doctor  about reaching your blood sugar goals High blood sugars (greater than 180 mg/dL) can raise your risk of infections and slow your recovery, so you will need to focus on controlling your diabetes during the weeks before surgery. Make sure that the doctor who takes care of your diabetes knows about your planned surgery including the date and location.  How do I manage my blood sugar before surgery? Check your blood sugar at least 4 times a day, starting 2 days before surgery, to make sure that the level is not too high or low.  Check your blood sugar the morning of your surgery when you wake up and every 2 hours until you get to the Short Stay unit.  If your blood sugar is less than 70 mg/dL, you will need to treat for low blood sugar: Do not take insulin. Treat a low blood sugar (less than 70 mg/dL) with  cup of clear juice (cranberry or apple), 4 glucose tablets, OR glucose gel. Recheck blood sugar in 15 minutes after treatment (to make sure it is greater than 70 mg/dL). If your blood sugar is not greater than 70 mg/dL on recheck, call [phone] for further instructions. Report your blood sugar to the short stay nurse when you get to Short Stay.  If you are admitted to the hospital after surgery: Your blood sugar will be checked by the staff and you will probably be given insulin after surgery (instead of oral diabetes medicines) to make sure you have good blood sugar levels. The goal  for blood sugar control after surgery is 80-180 mg/dL.                     Do NOT Smoke (Tobacco/Vaping) for 24 hours prior to your procedure.  If you use a CPAP at night, you may bring your mask/headgear for your overnight stay.   Contacts, glasses, piercing's, hearing aid's, dentures or partials may not be worn into surgery, please bring cases for these belongings.    For patients admitted to the hospital, discharge time will be determined by your treatment team.   Patients discharged the day of surgery  will not be allowed to drive home, and someone needs to stay with them for 24 hours.  SURGICAL WAITING ROOM VISITATION Patients having surgery or a procedure may have no more than 2 support people in the waiting area - these visitors may rotate.   Children under the age of 6 must have an adult with them who is not the patient. If the patient needs to stay at the hospital during part of their recovery, the visitor guidelines for inpatient rooms apply. Pre-op nurse will coordinate an appropriate time for 1 support person to accompany patient in pre-op.  This support person may not rotate.   Please refer to the [facility] website for the visitor guidelines for Inpatients (after your surgery is over and you are in a regular room).    Special instructions:   - Preparing For Surgery  Before surgery, you can play an important role. Because skin is not sterile, your skin needs to be as free of germs as possible. You can reduce the number of germs on your skin by washing with CHG (chlorahexidine gluconate) Soap before surgery.  CHG is an antiseptic cleaner which kills germs and bonds with the skin to continue killing germs even after washing.    Oral Hygiene is also important to reduce your risk of infection.  Remember - BRUSH YOUR TEETH THE MORNING OF SURGERY WITH YOUR REGULAR TOOTHPASTE  Please do not use if you have an allergy to CHG or antibacterial soaps. If your skin becomes reddened/irritated stop using the CHG.  Do not shave (including legs and underarms) for at least 48 hours prior to first CHG shower. It is OK to shave your face.  Please follow these instructions carefully.   Shower the NIGHT BEFORE SURGERY and the MORNING OF SURGERY  If you chose to wash your hair, wash your hair first as usual with your normal shampoo.  After you shampoo, rinse your hair and body thoroughly to remove the shampoo.  Use CHG Soap as you would any other liquid soap. You can apply CHG directly  to the skin and wash gently with a scrungie or a clean washcloth.   Apply the CHG Soap to your body ONLY FROM THE NECK DOWN.  Do not use on open wounds or open sores. Avoid contact with your eyes, ears, mouth and genitals (private parts). Wash Face and genitals (private parts)  with your normal soap.   Wash thoroughly, paying special attention to the area where your surgery will be performed.  Thoroughly rinse your body with warm water from the neck down.  DO NOT shower/wash with your normal soap after using and rinsing off the CHG Soap.  Pat yourself dry with a CLEAN TOWEL.  Wear CLEAN PAJAMAS to bed the night before surgery  Place CLEAN SHEETS on your bed the night before your surgery  DO NOT SLEEP WITH PETS.  Day of Surgery: Take a shower with CHG soap. Do not wear jewelry or makeup Do not wear lotions, powders, perfumes, or deodorant. Do not shave 48 hours prior to surgery.   Do not bring valuables to the hospital. [facility] is not responsible for any belongings or valuables. Do not wear nail polish, gel polish, artificial nails, or any other type of covering on natural nails (fingers and toes) If you have artificial nails or gel coating that need to be removed by a nail salon, please have this removed prior to surgery. Artificial nails or gel coating may interfere with anesthesia's ability to adequately monitor your vital signs. Wear Clean/Comfortable clothing the morning of surgery Remember to brush your teeth WITH YOUR REGULAR TOOTHPASTE.   Please read over the following fact sheets that you were given.    If you received a COVID test during your pre-op visit  it is requested that you wear a mask when out in public, stay away from anyone that may not be feeling well and notify your surgeon if you develop symptoms. If you have been in contact with anyone that has tested positive in the last 10 days please notify you surgeon.

## 2022-07-13 ENCOUNTER — Other Ambulatory Visit: Payer: Self-pay

## 2022-07-13 ENCOUNTER — Encounter (HOSPITAL_COMMUNITY)
Admission: RE | Admit: 2022-07-13 | Discharge: 2022-07-13 | Disposition: A | Discharge status: Home / Self Care | Payer: BC Managed Care – PPO | Source: Ambulatory Visit | Attending: General Surgery | Admitting: General Surgery

## 2022-07-13 ENCOUNTER — Encounter (HOSPITAL_COMMUNITY): Payer: Self-pay

## 2022-07-13 VITALS — BP 153/87 | HR 99 | Temp 97.8°F | Resp 18 | Ht 65.0 in | Wt 218.7 lb

## 2022-07-13 DIAGNOSIS — Z01812 Encounter for preprocedural laboratory examination: Secondary | ICD-10-CM | POA: Insufficient documentation

## 2022-07-13 DIAGNOSIS — Z01818 Encounter for other preprocedural examination: Secondary | ICD-10-CM

## 2022-07-13 DIAGNOSIS — E119 Type 2 diabetes mellitus without complications: Secondary | ICD-10-CM | POA: Insufficient documentation

## 2022-07-13 HISTORY — DX: Type 2 diabetes mellitus without complications: E11.9

## 2022-07-13 HISTORY — DX: Cardiac murmur, unspecified: R01.1

## 2022-07-13 HISTORY — DX: Unspecified asthma, uncomplicated: J45.909

## 2022-07-13 LAB — CBC
HCT: 42.2 % (ref 36.0–46.0)
Hemoglobin: 13.2 g/dL (ref 12.0–15.0)
MCH: 28.8 pg (ref 26.0–34.0)
MCHC: 31.3 g/dL (ref 30.0–36.0)
MCV: 92.1 fL (ref 80.0–100.0)
Platelets: 226 10*3/uL (ref 150–400)
RBC: 4.58 MIL/uL (ref 3.87–5.11)
RDW: 15.2 % (ref 11.5–15.5)
WBC: 5.3 10*3/uL (ref 4.0–10.5)
nRBC: 0 % (ref 0.0–0.2)

## 2022-07-13 LAB — GLUCOSE, CAPILLARY: Glucose-Capillary: 199 mg/dL — ABNORMAL HIGH (ref 70–99)

## 2022-07-13 LAB — BASIC METABOLIC PANEL
Anion gap: 12 (ref 5–15)
BUN: 18 mg/dL (ref 8–23)
CO2: 26 mmol/L (ref 22–32)
Calcium: 9 mg/dL (ref 8.9–10.3)
Chloride: 100 mmol/L (ref 98–111)
Creatinine, Ser: 1.11 mg/dL — ABNORMAL HIGH (ref 0.44–1.00)
GFR, Estimated: 56 mL/min — ABNORMAL LOW (ref >60)
Glucose, Bld: 206 mg/dL — ABNORMAL HIGH (ref 70–99)
Potassium: 4.7 mmol/L (ref 3.5–5.1)
Sodium: 138 mmol/L (ref 135–145)

## 2022-07-13 NOTE — Progress Notes (Signed)
   07/13/22 0917  OBSTRUCTIVE SLEEP APNEA  Have you ever been diagnosed with sleep apnea through a sleep study? No  Do you snore loudly (loud enough to be heard through closed doors)?  1  Do you often feel tired, fatigued, or sleepy during the daytime (such as falling asleep during driving or talking to someone)? 0  Has anyone observed you stop breathing during your sleep? 0  Do you have, or are you being treated for high blood pressure? 1  BMI more than 35 kg/m2? 1  Age > 50 (1-yes) 1  Neck circumference greater than:Female 16 inches or larger, Female 17inches or larger? 1  Female Gender (Yes=1) 0  Obstructive Sleep Apnea Score 5  Score 5 or greater  Results sent to PCP

## 2022-07-13 NOTE — Progress Notes (Signed)
PCP - Dr. Deland Pretty Cardiologist - denies  PPM/ICD - denies   Chest x-ray - 06/18/22 EKG - 06/18/22 Stress Test - 08/11/10 ECHO - denies Cardiac Cath - denies  Sleep Study - denies  DM- type 2 Fasting Blood Sugar - 120-200  Checks Blood Sugar continuously, pt normally wears a continuous monitor but has taken it off "a couple days ago" prior to surgery and will leave it off until after surgery  Last dose of GLP1 agonist-  06/28/22 GLP1 instructions: Pt has not taken it since 1/15 and has been instructed to hold until after surgery  ASA/Blood Thinner Instructions: n/a   ERAS Protcol - yes, no drink   COVID TEST- n/a   Anesthesia review: yes, pt says she will see PCP on 2/1 to "make sure she is okay 'respiratory-wise' for surgery now" (Surgery was rescheduled due to breathing issues)  Patient denies shortness of breath, fever, cough and chest pain at PAT appointment. She does state that she still has some wheezing but no other symptoms,   All instructions explained to the patient, with a verbal understanding of the material. Patient agrees to go over the instructions while at home for a better understanding. The opportunity to ask questions was provided.

## 2022-07-15 ENCOUNTER — Ambulatory Visit: Payer: BC Managed Care – PPO | Admitting: Hematology and Oncology

## 2022-07-15 ENCOUNTER — Other Ambulatory Visit: Payer: BC Managed Care – PPO

## 2022-07-15 ENCOUNTER — Ambulatory Visit: Payer: BC Managed Care – PPO | Admitting: Pharmacist

## 2022-07-15 DIAGNOSIS — Z8619 Personal history of other infectious and parasitic diseases: Secondary | ICD-10-CM | POA: Diagnosis not present

## 2022-07-15 DIAGNOSIS — N179 Acute kidney failure, unspecified: Secondary | ICD-10-CM | POA: Diagnosis not present

## 2022-07-15 DIAGNOSIS — Z09 Encounter for follow-up examination after completed treatment for conditions other than malignant neoplasm: Secondary | ICD-10-CM | POA: Diagnosis not present

## 2022-07-15 DIAGNOSIS — C50912 Malignant neoplasm of unspecified site of left female breast: Secondary | ICD-10-CM | POA: Diagnosis not present

## 2022-07-15 DIAGNOSIS — Z8701 Personal history of pneumonia (recurrent): Secondary | ICD-10-CM | POA: Diagnosis not present

## 2022-07-15 DIAGNOSIS — C50911 Malignant neoplasm of unspecified site of right female breast: Secondary | ICD-10-CM | POA: Diagnosis not present

## 2022-07-16 NOTE — Anesthesia Preprocedure Evaluation (Signed)
Anesthesia Evaluation  Patient identified by MRN, date of birth, ID band Patient awake    Reviewed: Allergy & Precautions, NPO status , Patient's Chart, lab work & pertinent test results  History of Anesthesia Complications Negative for: history of anesthetic complications  Airway Mallampati: IV  TM Distance: >3 FB Neck ROM: Full    Dental  (+) Upper Dentures, Lower Dentures, Edentulous Upper, Edentulous Lower, Dental Advisory Given   Pulmonary asthma , former smoker   breath sounds clear to auscultation       Cardiovascular hypertension, Pt. on medications (-) angina (-) Past MI and (-) CHF  Rhythm:Regular     Neuro/Psych  PSYCHIATRIC DISORDERS Anxiety     negative neurological ROS     GI/Hepatic Neg liver ROS,GERD  ,,  Endo/Other  diabetes    Renal/GU Renal diseaseLab Results      Component                Value               Date                      CREATININE               1.11 (H)            07/13/2022                Musculoskeletal  (+) Arthritis ,    Abdominal   Peds  Hematology negative hematology ROS (+) Lab Results      Component                Value               Date                      WBC                      5.3                 07/13/2022                HGB                      13.2                07/13/2022                HCT                      42.2                07/13/2022                MCV                      92.1                07/13/2022                PLT                      226                 07/13/2022              Anesthesia Other Findings   Reproductive/Obstetrics  Anesthesia Physical Anesthesia Plan  ASA: 3  Anesthesia Plan: General and Regional   Post-op Pain Management: Regional block*   Induction: Intravenous  PONV Risk Score and Plan: 3 and Ondansetron, Dexamethasone, Propofol infusion, TIVA and  Midazolam  Airway Management Planned: Oral ETT  Additional Equipment: None  Intra-op Plan:   Post-operative Plan: Extubation in OR  Informed Consent: I have reviewed the patients History and Physical, chart, labs and discussed the procedure including the risks, benefits and alternatives for the proposed anesthesia with the patient or authorized representative who has indicated his/her understanding and acceptance.     Dental advisory given  Plan Discussed with: CRNA  Anesthesia Plan Comments: (PAT note by Karoline Caldwell, PA-C: 62 year old female with history of HTN, HLD, non-insulin-dependent DM2, GERD, chronic pain syndrome on narcotics, recently diagnosed bilateral breast cancer.  She was recently admitted 1/5 through 06/21/2022 for encephalopathy in the setting of pyelonephritis.  AKI on admission significantly improved with supportive care.  There was some concern for pneumonia on CT chest, however she was noted to be completely symptomatic-no cough/SOB.  She was initially treated with Rocephin/Zithromax-however it was felt she could be transition to amoxicillin as CT chest findings of pneumonia were likely incidental and her encephalopathy was felt due to mostly to UTI.  During admission she was also noted to have nocturnal hypoxemia with suspicion for undiagnosed OSA.  Patient had post discharge follow-up with her PCP at [facility] on 07/15/2022.  She was noted to be improving since hospitalization, no respiratory symptoms, upcoming bilateral mastectomy discussed.  Chest x-ray was done which showed no consolidative pneumonia.  She was noted to have some bronchial wall thickening of the central airways.  Patient is on once weekly GLP-1 agonist Mounjaro.  She reports last dose 06/28/2022.  Preop labs reviewed, unremarkable.  EKG 06/18/2022: Sinus tachycardia.  Rate 115.  Right bundle branch block.  )        Anesthesia Quick Evaluation

## 2022-07-16 NOTE — Progress Notes (Signed)
Anesthesia Chart Review:  62 year old female with history of HTN, HLD, non-insulin-dependent DM2, GERD, chronic pain syndrome on narcotics, recently diagnosed bilateral breast cancer.  She was recently admitted 1/5 through 06/21/2022 for encephalopathy in the setting of pyelonephritis.  AKI on admission significantly improved with supportive care.  There was some concern for pneumonia on CT chest, however she was noted to be completely symptomatic-no cough/SOB.  She was initially treated with Rocephin/Zithromax-however it was felt she could be transition to amoxicillin as CT chest findings of pneumonia were likely incidental and her encephalopathy was felt due to mostly to UTI.  During admission she was also noted to have nocturnal hypoxemia with suspicion for undiagnosed OSA.  Patient had post discharge follow-up with her PCP at [facility] on 07/15/2022.  She was noted to be improving since hospitalization, no respiratory symptoms, upcoming bilateral mastectomy discussed.  Chest x-ray was done which showed no consolidative pneumonia.  She was noted to have some bronchial wall thickening of the central airways.  Patient is on once weekly GLP-1 agonist Mounjaro.  She reports last dose 06/28/2022.  Preop labs reviewed, unremarkable.  EKG 06/18/2022: Sinus tachycardia.  Rate 115.  Right bundle branch block.    Wynonia Musty [facility] Short Stay Center/Anesthesiology Phone [phone] 07/16/2022 12:01 PM

## 2022-07-19 ENCOUNTER — Ambulatory Visit (HOSPITAL_COMMUNITY)
Admission: RE | Admit: 2022-07-19 | Discharge: 2022-07-19 | Disposition: A | Discharge status: Home / Self Care | Payer: BC Managed Care – PPO | Source: Ambulatory Visit | Attending: General Surgery | Admitting: General Surgery

## 2022-07-19 ENCOUNTER — Encounter (HOSPITAL_COMMUNITY)
Admission: RE | Disposition: A | Discharge status: Home / Self Care | Payer: Self-pay | Source: Home / Self Care | Attending: General Surgery

## 2022-07-19 ENCOUNTER — Encounter (HOSPITAL_COMMUNITY): Payer: Self-pay | Admitting: General Surgery

## 2022-07-19 ENCOUNTER — Ambulatory Visit (HOSPITAL_COMMUNITY)
Admission: RE | Admit: 2022-07-19 | Discharge: 2022-07-20 | Disposition: A | Discharge status: Home / Self Care | Payer: BC Managed Care – PPO | Attending: General Surgery | Admitting: General Surgery

## 2022-07-19 ENCOUNTER — Ambulatory Visit (HOSPITAL_COMMUNITY): Payer: BC Managed Care – PPO | Admitting: Physician Assistant

## 2022-07-19 ENCOUNTER — Other Ambulatory Visit: Payer: Self-pay

## 2022-07-19 DIAGNOSIS — Z171 Estrogen receptor negative status [ER-]: Secondary | ICD-10-CM | POA: Diagnosis not present

## 2022-07-19 DIAGNOSIS — I10 Essential (primary) hypertension: Secondary | ICD-10-CM | POA: Insufficient documentation

## 2022-07-19 DIAGNOSIS — C50411 Malignant neoplasm of upper-outer quadrant of right female breast: Secondary | ICD-10-CM | POA: Insufficient documentation

## 2022-07-19 DIAGNOSIS — Z7985 Long-term (current) use of injectable non-insulin antidiabetic drugs: Secondary | ICD-10-CM | POA: Insufficient documentation

## 2022-07-19 DIAGNOSIS — E119 Type 2 diabetes mellitus without complications: Secondary | ICD-10-CM | POA: Diagnosis not present

## 2022-07-19 DIAGNOSIS — C50911 Malignant neoplasm of unspecified site of right female breast: Secondary | ICD-10-CM | POA: Diagnosis not present

## 2022-07-19 DIAGNOSIS — Z170 Estrogen receptor positive status [ER+]: Secondary | ICD-10-CM | POA: Diagnosis not present

## 2022-07-19 DIAGNOSIS — Z87891 Personal history of nicotine dependence: Secondary | ICD-10-CM | POA: Diagnosis not present

## 2022-07-19 DIAGNOSIS — C50812 Malignant neoplasm of overlapping sites of left female breast: Secondary | ICD-10-CM | POA: Insufficient documentation

## 2022-07-19 DIAGNOSIS — C50912 Malignant neoplasm of unspecified site of left female breast: Secondary | ICD-10-CM | POA: Diagnosis not present

## 2022-07-19 DIAGNOSIS — K219 Gastro-esophageal reflux disease without esophagitis: Secondary | ICD-10-CM | POA: Diagnosis not present

## 2022-07-19 DIAGNOSIS — G8918 Other acute postprocedural pain: Secondary | ICD-10-CM | POA: Diagnosis not present

## 2022-07-19 DIAGNOSIS — C50811 Malignant neoplasm of overlapping sites of right female breast: Secondary | ICD-10-CM

## 2022-07-19 HISTORY — PX: SENTINEL NODE BIOPSY: SHX6608

## 2022-07-19 HISTORY — PX: TOTAL MASTECTOMY: SHX6129

## 2022-07-19 LAB — GLUCOSE, CAPILLARY
Glucose-Capillary: 224 mg/dL — ABNORMAL HIGH (ref 70–99)
Glucose-Capillary: 167 mg/dL — ABNORMAL HIGH (ref 70–99)
Glucose-Capillary: 182 mg/dL — ABNORMAL HIGH (ref 70–99)
Glucose-Capillary: 225 mg/dL — ABNORMAL HIGH (ref 70–99)
Glucose-Capillary: 313 mg/dL — ABNORMAL HIGH (ref 70–99)

## 2022-07-19 SURGERY — MASTECTOMY, SIMPLE
Anesthesia: Regional | Site: Breast | Laterality: Bilateral

## 2022-07-19 MED ORDER — SODIUM CHLORIDE 0.9 % IV SOLN: INTRAVENOUS | Status: DC

## 2022-07-19 MED ORDER — FENTANYL CITRATE (PF) 250 MCG/5ML IJ SOLN
INTRAMUSCULAR | Status: DC | PRN
  Administered 2022-07-19 (×4): 50 ug via INTRAVENOUS

## 2022-07-19 MED ORDER — AMITRIPTYLINE HCL 50 MG PO TABS
50.0000 mg | ORAL_TABLET | Freq: Every day | ORAL | Status: DC
  Administered 2022-07-19: 50 mg via ORAL
  Filled 2022-07-19: qty 1

## 2022-07-19 MED ORDER — METHOCARBAMOL 500 MG PO TABS: 500.0000 mg | ORAL_TABLET | Freq: Four times a day (QID) | ORAL | Status: DC | PRN

## 2022-07-19 MED ORDER — SUGAMMADEX SODIUM 200 MG/2ML IV SOLN
INTRAVENOUS | Status: DC | PRN
  Administered 2022-07-19: 200 mg via INTRAVENOUS

## 2022-07-19 MED ORDER — MIDAZOLAM HCL 2 MG/2ML IJ SOLN
INTRAMUSCULAR | Status: AC
  Administered 2022-07-19: 2 mg via INTRAVENOUS
  Filled 2022-07-19: qty 2

## 2022-07-19 MED ORDER — CHLORHEXIDINE GLUCONATE 0.12 % MT SOLN
15.0000 mL | Freq: Once | OROMUCOSAL | Status: AC
  Administered 2022-07-19: 15 mL via OROMUCOSAL
  Filled 2022-07-19: qty 15

## 2022-07-19 MED ORDER — OXYCODONE HCL ER 15 MG PO T12A
40.0000 mg | EXTENDED_RELEASE_TABLET | Freq: Two times a day (BID) | ORAL | Status: DC
  Administered 2022-07-19 – 2022-07-20 (×2): 40 mg via ORAL
  Filled 2022-07-19 (×2): qty 1

## 2022-07-19 MED ORDER — INSULIN ASPART 100 UNIT/ML IJ SOLN
INTRAMUSCULAR | Status: AC
  Filled 2022-07-19: qty 1

## 2022-07-19 MED ORDER — PROPOFOL 10 MG/ML IV BOLUS
INTRAVENOUS | Status: DC | PRN
  Administered 2022-07-19: 200 mg via INTRAVENOUS

## 2022-07-19 MED ORDER — AMLODIPINE BESYLATE 10 MG PO TABS
10.0000 mg | ORAL_TABLET | Freq: Every day | ORAL | Status: DC
  Administered 2022-07-20: 10 mg via ORAL
  Filled 2022-07-19: qty 1

## 2022-07-19 MED ORDER — METFORMIN HCL ER 500 MG PO TB24
1000.0000 mg | ORAL_TABLET | Freq: Two times a day (BID) | ORAL | Status: DC
  Filled 2022-07-19: qty 2

## 2022-07-19 MED ORDER — FENTANYL CITRATE (PF) 100 MCG/2ML IJ SOLN
25.0000 ug | INTRAMUSCULAR | Status: DC | PRN
  Administered 2022-07-19 (×3): 50 ug via INTRAVENOUS

## 2022-07-19 MED ORDER — ALPRAZOLAM 0.5 MG PO TABS
0.5000 mg | ORAL_TABLET | Freq: Once | ORAL | Status: AC
  Administered 2022-07-19: 0.5 mg via ORAL
  Filled 2022-07-19: qty 1

## 2022-07-19 MED ORDER — ENOXAPARIN SODIUM 30 MG/0.3ML IJ SOSY
30.0000 mg | PREFILLED_SYRINGE | INTRAMUSCULAR | Status: DC
  Administered 2022-07-20: 30 mg via SUBCUTANEOUS
  Filled 2022-07-19: qty 0.3

## 2022-07-19 MED ORDER — BUPIVACAINE HCL (PF) 0.25 % IJ SOLN
INTRAMUSCULAR | Status: AC
  Filled 2022-07-19: qty 30

## 2022-07-19 MED ORDER — FENTANYL CITRATE (PF) 100 MCG/2ML IJ SOLN
INTRAMUSCULAR | Status: AC
  Filled 2022-07-19: qty 2

## 2022-07-19 MED ORDER — ACETAMINOPHEN 500 MG PO TABS
1000.0000 mg | ORAL_TABLET | ORAL | Status: AC
  Administered 2022-07-19: 1000 mg via ORAL
  Filled 2022-07-19: qty 2

## 2022-07-19 MED ORDER — FENTANYL CITRATE (PF) 250 MCG/5ML IJ SOLN
INTRAMUSCULAR | Status: AC
  Filled 2022-07-19: qty 5

## 2022-07-19 MED ORDER — DEXAMETHASONE SODIUM PHOSPHATE 10 MG/ML IJ SOLN
INTRAMUSCULAR | Status: DC | PRN
  Administered 2022-07-19: 4 mg via INTRAVENOUS

## 2022-07-19 MED ORDER — BUPIVACAINE LIPOSOME 1.3 % IJ SUSP
INTRAMUSCULAR | Status: DC | PRN
  Administered 2022-07-19 (×2): 10 mL via PERINEURAL

## 2022-07-19 MED ORDER — CHLORHEXIDINE GLUCONATE CLOTH 2 % EX PADS: 6.0000 | MEDICATED_PAD | Freq: Once | CUTANEOUS | Status: DC

## 2022-07-19 MED ORDER — OXYCODONE HCL 5 MG PO TABS
ORAL_TABLET | ORAL | Status: AC
  Filled 2022-07-19: qty 1

## 2022-07-19 MED ORDER — ROCURONIUM BROMIDE 10 MG/ML (PF) SYRINGE
PREFILLED_SYRINGE | INTRAVENOUS | Status: DC | PRN
  Administered 2022-07-19: 20 mg via INTRAVENOUS
  Administered 2022-07-19: 80 mg via INTRAVENOUS

## 2022-07-19 MED ORDER — BUPIVACAINE-EPINEPHRINE (PF) 0.25% -1:200000 IJ SOLN
INTRAMUSCULAR | Status: DC | PRN
  Administered 2022-07-19 (×2): 20 mL via PERINEURAL

## 2022-07-19 MED ORDER — MIDAZOLAM HCL 2 MG/2ML IJ SOLN
INTRAMUSCULAR | Status: DC | PRN
  Administered 2022-07-19: 2 mg via INTRAVENOUS

## 2022-07-19 MED ORDER — PHENYLEPHRINE 80 MCG/ML (10ML) SYRINGE FOR IV PUSH (FOR BLOOD PRESSURE SUPPORT)
PREFILLED_SYRINGE | INTRAVENOUS | Status: AC
  Filled 2022-07-19: qty 30

## 2022-07-19 MED ORDER — ONDANSETRON HCL 4 MG/2ML IJ SOLN
INTRAMUSCULAR | Status: AC
  Filled 2022-07-19: qty 2

## 2022-07-19 MED ORDER — PROPOFOL 10 MG/ML IV BOLUS
INTRAVENOUS | Status: AC
  Filled 2022-07-19: qty 20

## 2022-07-19 MED ORDER — ALPRAZOLAM 0.5 MG PO TABS
0.5000 mg | ORAL_TABLET | Freq: Two times a day (BID) | ORAL | Status: DC | PRN
  Administered 2022-07-19: 0.5 mg via ORAL
  Filled 2022-07-19: qty 1

## 2022-07-19 MED ORDER — LACTATED RINGERS IV SOLN: INTRAVENOUS | Status: DC

## 2022-07-19 MED ORDER — BUPIVACAINE LIPOSOME 1.3 % IJ SUSP
INTRAMUSCULAR | Status: AC
  Filled 2022-07-19: qty 20

## 2022-07-19 MED ORDER — INSULIN ASPART 100 UNIT/ML IJ SOLN
0.0000 [IU] | INTRAMUSCULAR | Status: DC | PRN
  Administered 2022-07-19: 6 [IU] via SUBCUTANEOUS

## 2022-07-19 MED ORDER — ONDANSETRON HCL 4 MG/2ML IJ SOLN: 4.0000 mg | Freq: Four times a day (QID) | INTRAMUSCULAR | Status: DC | PRN

## 2022-07-19 MED ORDER — PHENYLEPHRINE 80 MCG/ML (10ML) SYRINGE FOR IV PUSH (FOR BLOOD PRESSURE SUPPORT)
PREFILLED_SYRINGE | INTRAVENOUS | Status: DC | PRN
  Administered 2022-07-19 (×4): 80 ug via INTRAVENOUS
  Administered 2022-07-19: 240 ug via INTRAVENOUS
  Administered 2022-07-19: 160 ug via INTRAVENOUS

## 2022-07-19 MED ORDER — MIDAZOLAM HCL 2 MG/2ML IJ SOLN: 2.0000 mg | Freq: Once | INTRAMUSCULAR | Status: AC

## 2022-07-19 MED ORDER — CEFAZOLIN SODIUM-DEXTROSE 2-4 GM/100ML-% IV SOLN
2.0000 g | INTRAVENOUS | Status: DC
  Filled 2022-07-19: qty 100

## 2022-07-19 MED ORDER — ALPRAZOLAM 0.5 MG PO TABS: 1.0000 mg | ORAL_TABLET | Freq: Every day | ORAL | Status: DC

## 2022-07-19 MED ORDER — OXYCODONE HCL 5 MG/5ML PO SOLN: 5.0000 mg | Freq: Once | ORAL | Status: AC | PRN

## 2022-07-19 MED ORDER — ALPRAZOLAM 0.25 MG PO TABS
0.2500 mg | ORAL_TABLET | Freq: Two times a day (BID) | ORAL | Status: DC | PRN
  Filled 2022-07-19: qty 1

## 2022-07-19 MED ORDER — METFORMIN HCL ER 500 MG PO TB24
500.0000 mg | ORAL_TABLET | Freq: Two times a day (BID) | ORAL | Status: DC
  Administered 2022-07-19: 500 mg via ORAL
  Filled 2022-07-19 (×3): qty 1

## 2022-07-19 MED ORDER — ACETAMINOPHEN 10 MG/ML IV SOLN: 1000.0000 mg | Freq: Once | INTRAVENOUS | Status: DC | PRN

## 2022-07-19 MED ORDER — PREGABALIN 100 MG PO CAPS
100.0000 mg | ORAL_CAPSULE | Freq: Two times a day (BID) | ORAL | Status: DC
  Administered 2022-07-19 – 2022-07-20 (×2): 100 mg via ORAL
  Filled 2022-07-19 (×2): qty 1

## 2022-07-19 MED ORDER — PANTOPRAZOLE SODIUM 40 MG PO TBEC
40.0000 mg | DELAYED_RELEASE_TABLET | Freq: Every day | ORAL | Status: DC
  Administered 2022-07-20: 40 mg via ORAL
  Filled 2022-07-19: qty 1

## 2022-07-19 MED ORDER — FENTANYL CITRATE (PF) 100 MCG/2ML IJ SOLN: 100.0000 ug | Freq: Once | INTRAMUSCULAR | Status: AC

## 2022-07-19 MED ORDER — TECHNETIUM TC 99M TILMANOCEPT KIT
2.0000 | PACK | Freq: Once | INTRAVENOUS | Status: AC | PRN
  Administered 2022-07-19: 2 via INTRADERMAL

## 2022-07-19 MED ORDER — ACETAMINOPHEN 500 MG PO TABS: 1000.0000 mg | ORAL_TABLET | Freq: Once | ORAL | Status: DC | PRN

## 2022-07-19 MED ORDER — DEXAMETHASONE SODIUM PHOSPHATE 10 MG/ML IJ SOLN
INTRAMUSCULAR | Status: AC
  Filled 2022-07-19: qty 3

## 2022-07-19 MED ORDER — ROCURONIUM BROMIDE 10 MG/ML (PF) SYRINGE
PREFILLED_SYRINGE | INTRAVENOUS | Status: AC
  Filled 2022-07-19: qty 20

## 2022-07-19 MED ORDER — 0.9 % SODIUM CHLORIDE (POUR BTL) OPTIME
TOPICAL | Status: DC | PRN
  Administered 2022-07-19: 1000 mL

## 2022-07-19 MED ORDER — OXYCODONE HCL 5 MG PO TABS
5.0000 mg | ORAL_TABLET | ORAL | Status: DC | PRN
  Filled 2022-07-19: qty 2

## 2022-07-19 MED ORDER — LIDOCAINE 2% (20 MG/ML) 5 ML SYRINGE
INTRAMUSCULAR | Status: AC
  Filled 2022-07-19: qty 10

## 2022-07-19 MED ORDER — ALBUTEROL SULFATE (2.5 MG/3ML) 0.083% IN NEBU: 2.5000 mg | INHALATION_SOLUTION | Freq: Four times a day (QID) | RESPIRATORY_TRACT | Status: DC | PRN

## 2022-07-19 MED ORDER — ACETAMINOPHEN 160 MG/5ML PO SOLN: 1000.0000 mg | Freq: Once | ORAL | Status: DC | PRN

## 2022-07-19 MED ORDER — MIDAZOLAM HCL 2 MG/2ML IJ SOLN
INTRAMUSCULAR | Status: AC
  Filled 2022-07-19: qty 2

## 2022-07-19 MED ORDER — PHENYLEPHRINE HCL-NACL 20-0.9 MG/250ML-% IV SOLN
INTRAVENOUS | Status: DC | PRN
  Administered 2022-07-19: 50 ug/min via INTRAVENOUS

## 2022-07-19 MED ORDER — ORAL CARE MOUTH RINSE: 15.0000 mL | Freq: Once | OROMUCOSAL | Status: AC

## 2022-07-19 MED ORDER — MORPHINE SULFATE (PF) 2 MG/ML IV SOLN
1.0000 mg | INTRAVENOUS | Status: DC | PRN
  Administered 2022-07-19 – 2022-07-20 (×4): 2 mg via INTRAVENOUS
  Filled 2022-07-19 (×4): qty 1

## 2022-07-19 MED ORDER — ONDANSETRON 4 MG PO TBDP: 4.0000 mg | ORAL_TABLET | Freq: Four times a day (QID) | ORAL | Status: DC | PRN

## 2022-07-19 MED ORDER — OXYCODONE HCL 5 MG PO TABS
5.0000 mg | ORAL_TABLET | Freq: Once | ORAL | Status: AC | PRN
  Administered 2022-07-19: 5 mg via ORAL

## 2022-07-19 MED ORDER — GABAPENTIN 100 MG PO CAPS
100.0000 mg | ORAL_CAPSULE | ORAL | Status: AC
  Administered 2022-07-19: 100 mg via ORAL
  Filled 2022-07-19: qty 1

## 2022-07-19 MED ORDER — ONDANSETRON HCL 4 MG/2ML IJ SOLN
INTRAMUSCULAR | Status: DC | PRN
  Administered 2022-07-19: 4 mg via INTRAVENOUS

## 2022-07-19 MED ORDER — EZETIMIBE 10 MG PO TABS
10.0000 mg | ORAL_TABLET | Freq: Every day | ORAL | Status: DC
  Administered 2022-07-20: 10 mg via ORAL
  Filled 2022-07-19: qty 1

## 2022-07-19 MED ORDER — FENTANYL CITRATE (PF) 100 MCG/2ML IJ SOLN
INTRAMUSCULAR | Status: AC
  Administered 2022-07-19: 100 ug via INTRAVENOUS
  Filled 2022-07-19: qty 2

## 2022-07-19 SURGICAL SUPPLY — 48 items
ADH SKN CLS APL DERMABOND .7 (GAUZE/BANDAGES/DRESSINGS) ×4
APL PRP STRL LF DISP 70% ISPRP (MISCELLANEOUS) ×4
APPLIER CLIP 11 MED OPEN (CLIP) ×2
APPLIER CLIP 9.375 MED OPEN (MISCELLANEOUS) ×4
APR CLP MED 11 20 MLT OPN (CLIP) ×2
APR CLP MED 9.3 20 MLT OPN (MISCELLANEOUS) ×4
BAG COUNTER SPONGE SURGICOUNT (BAG) ×3 IMPLANT
BAG SPNG CNTER NS LX DISP (BAG) ×2
BINDER BREAST LRG (GAUZE/BANDAGES/DRESSINGS) IMPLANT
BINDER BREAST XLRG (GAUZE/BANDAGES/DRESSINGS) IMPLANT
BIOPATCH RED 1 DISK 7.0 (GAUZE/BANDAGES/DRESSINGS) ×6 IMPLANT
CANISTER SUCT 3000ML PPV (MISCELLANEOUS) ×3 IMPLANT
CHLORAPREP W/TINT 26 (MISCELLANEOUS) ×3 IMPLANT
CLIP APPLIE 11 MED OPEN (CLIP) IMPLANT
CLIP APPLIE 9.375 MED OPEN (MISCELLANEOUS) ×3 IMPLANT
COVER SURGICAL LIGHT HANDLE (MISCELLANEOUS) ×3 IMPLANT
DERMABOND ADVANCED .7 DNX12 (GAUZE/BANDAGES/DRESSINGS) ×3 IMPLANT
DEVICE DSSCT PLSMBLD 3.0S LGHT (MISCELLANEOUS) ×3 IMPLANT
DRAIN CHANNEL 19F RND (DRAIN) ×3 IMPLANT
DRAPE LAPAROSCOPIC ABDOMINAL (DRAPES) ×3 IMPLANT
DRSG TEGADERM 4X4.75 (GAUZE/BANDAGES/DRESSINGS) IMPLANT
ELECT REM PT RETURN 9FT ADLT (ELECTROSURGICAL) ×2
ELECTRODE REM PT RTRN 9FT ADLT (ELECTROSURGICAL) ×3 IMPLANT
EVACUATOR SILICONE 100CC (DRAIN) ×3 IMPLANT
GAUZE PAD ABD 8X10 STRL (GAUZE/BANDAGES/DRESSINGS) ×6 IMPLANT
GAUZE SPONGE 4X4 12PLY STRL (GAUZE/BANDAGES/DRESSINGS) ×3 IMPLANT
GLOVE BIO SURGEON STRL SZ7.5 (GLOVE) ×3 IMPLANT
GOWN STRL REUS W/ TWL LRG LVL3 (GOWN DISPOSABLE) ×6 IMPLANT
GOWN STRL REUS W/TWL LRG LVL3 (GOWN DISPOSABLE) ×2
KIT BASIN OR (CUSTOM PROCEDURE TRAY) ×3 IMPLANT
KIT TURNOVER KIT B (KITS) ×3 IMPLANT
LIGHT WAVEGUIDE WIDE FLAT (MISCELLANEOUS) IMPLANT
NS IRRIG 1000ML POUR BTL (IV SOLUTION) ×3 IMPLANT
PACK GENERAL/GYN (CUSTOM PROCEDURE TRAY) ×3 IMPLANT
PAD ABD 8X10 STRL (GAUZE/BANDAGES/DRESSINGS) IMPLANT
PAD ARMBOARD 7.5X6 YLW CONV (MISCELLANEOUS) ×3 IMPLANT
PENCIL SMOKE EVACUATOR (MISCELLANEOUS) IMPLANT
PLASMABLADE 3.0S W/LIGHT (MISCELLANEOUS) ×2
SPECIMEN JAR X LARGE (MISCELLANEOUS) ×3 IMPLANT
SUT ETHILON 3 0 FSL (SUTURE) ×3 IMPLANT
SUT MNCRL AB 4-0 PS2 18 (SUTURE) IMPLANT
SUT MON AB 4-0 PC3 18 (SUTURE) ×3 IMPLANT
SUT VIC AB 3-0 54X BRD REEL (SUTURE) IMPLANT
SUT VIC AB 3-0 BRD 54 (SUTURE)
SUT VIC AB 3-0 SH 18 (SUTURE) ×3 IMPLANT
TOWEL GREEN STERILE (TOWEL DISPOSABLE) ×3 IMPLANT
TOWEL GREEN STERILE FF (TOWEL DISPOSABLE) ×3 IMPLANT
TUBE CONNECTING 12X1/4 (SUCTIONS) ×3 IMPLANT

## 2022-07-19 NOTE — Anesthesia Procedure Notes (Signed)
Anesthesia Regional Block: Pectoralis block   Pre-Anesthetic Checklist: , timeout performed,  Correct Patient, Correct Site, Correct Laterality,  Correct Procedure, Correct Position, site marked,  Risks and benefits discussed,  Surgical consent,  Pre-op evaluation,  At surgeon's request and post-op pain management  Laterality: Left  Prep: chloraprep       Needles:  Injection technique: Single-shot      Needle Length: 9cm  Needle Gauge: 22     Additional Needles: Arrow StimuQuik ECHO Echogenic Stimulating PNB Needle  Procedures:,,,, ultrasound used (permanent image in chart),,    Narrative:  Start time: 07/19/2022 9:38 AM End time: 07/19/2022 9:43 AM Injection made incrementally with aspirations every 5 mL.  Performed by: Personally  Anesthesiologist: Oleta Mouse, MD

## 2022-07-19 NOTE — TOC CM/SW Note (Signed)
  Transition of Care [facility]) Screening Note   Patient Details  Name: [name] Date of Birth: 05/17/1961    Transition of Care Department [facility]) has reviewed patient and no TOC needs have been identified at this time. We will continue to monitor patient advancement through interdisciplinary progression rounds. If new patient transition needs arise, please place a TOC consult.

## 2022-07-19 NOTE — Anesthesia Postprocedure Evaluation (Signed)
Anesthesia Post Note  Patient: [name]  Procedure(s) Performed: BILATERAL TOTAL MASTECTOMY (Bilateral: Breast) BILATERAL SENTINEL NODE BIOPSY (Bilateral: Axilla)     Patient location during evaluation: PACU Anesthesia Type: Regional and General Level of consciousness: awake and alert Pain management: pain level controlled Vital Signs Assessment: post-procedure vital signs reviewed and stable Respiratory status: spontaneous breathing, nonlabored ventilation and respiratory function stable Cardiovascular status: blood pressure returned to baseline and stable Postop Assessment: no apparent nausea or vomiting Anesthetic complications: no   No notable events documented.  Last Vitals:  Vitals:   07/19/22 1445 07/19/22 1512  BP: 110/89 115/77  Pulse: 84 91  Resp: 12 16  Temp: 36.6 C 36.6 C  SpO2: 93% 94%    Last Pain:  Vitals:   07/19/22 1818  TempSrc:   PainSc: 6                  Shenelle Klas

## 2022-07-19 NOTE — Op Note (Signed)
07/19/2022  1:05 PM  PATIENT:  [name]  62 y.o. female  PRE-OPERATIVE DIAGNOSIS:  BILATERAL BREAST CANCER  POST-OPERATIVE DIAGNOSIS:  BILATERAL BREAST CANCER  PROCEDURE:  Procedure(s): BILATERAL TOTAL MASTECTOMY (Bilateral) BILATERAL SENTINEL NODE BIOPSY (Bilateral)  SURGEON:  Surgeon(s) and Role:    * Jovita Kussmaul, MD - Primary  PHYSICIAN ASSISTANT:   ASSISTANTS: none   ANESTHESIA:   general  EBL:  100 mL   BLOOD ADMINISTERED:none  DRAINS: (2) Blake drain(s) in the prepectoral space    LOCAL MEDICATIONS USED:  NONE  SPECIMEN:  Source of Specimen:  right mastectomy and sentinel node, left mastectomy and sentinel nodes x 3  DISPOSITION OF SPECIMEN:  PATHOLOGY  COUNTS:  YES  TOURNIQUET:  * No tourniquets in log *  DICTATION: .Dragon Dictation  After consent was obtained the patient was brought to the operating room and placed in the supine position on the operating table. After adequate induction of general anesthesia the patient's bilateral chest Breast, and axillary areas were prepped with ChloraPrep, allowed to dry, and draped in usual sterile manner.  An appropriate timeout was performed.  Earlier today patient underwent injection of 1 mCi of technetium sulfur colloid in the subareolar position on both sides.  Attention was first turned to the right breast.  An elliptical incision was made around the nipple areolar complex in order to minimize the excess skin.  The incision was carried through the skin and subcutaneous tissue sharply with the PlasmaBlade.  Breast hooks were used to elevate the skin flaps anteriorly towards the saline.  Thin skin flaps were then created by dissecting between the breast tissue in the subcutaneous fat and skin.  This dissection was carried circumferentially all the way to the chest wall muscle.  Next the breast was removed from the pectoralis muscle with the pectoralis fascia.  Once this was accomplished the entire right breast was  removed from the patient.  It was marked with a stitch on the lateral skin.  The neoprobe was set to technetium and an area of radioactivity was readily identified in the right axilla.  This area was excised sharply with the PlasmaBlade and the surrounding small vessels and lymphatics were controlled with clips.  Ex vivo counts on this node were approximately 2000.  This was sent as sentinel node #1.  No other hot or palpable nodes were identified in the right axilla.  Hemostasis was achieved using the PlasmaBlade.  The wound was irrigated with copious amounts of saline.  A small stab incision was made near the anterior axillary line inferior to the operative bed.  A hemostat was placed through this opening and used to bring a 19 [place] round Blake drain into the operative bed.  The drain was curled along the chest wall.  The drain was anchored to the skin with a 3-0 nylon stitch.  Next the superior and inferior skin flaps were grossly reapproximated with interrupted 3-0 Vicryl stitches.  The skin was then closed with running 4-0 Monocryl subcuticular stitches.  Dermabond dressings and sterile drain dressings were applied.  Attention was then turned to the left breast.  A similar elliptical incision was made with a 10 blade knife to minimize the excess skin.  The incision was carried through the skin and subcutaneous tissue sharply with the PlasmaBlade.  Breast hooks were used to elevate the skin flaps anteriorly towards the ceiling.  Thin skin flaps were then created by dissecting between the breast tissue in the subcutaneous fat  and skin.  This dissection was carried circumferentially all the way to the muscle of the chest wall.  Next the breast was removed from the pectoralis muscle with the pectoralis fashion.  Once this was accomplished then the entire left breast was removed from the patient.  It was marked with a stitch on the lateral skin and sent to pathology.  The neoprobe was set to technetium and an area  of radioactivity was readily identified in the left axilla.  I was able to identify 3 lymph nodes with signal.  Each of these areas was excised sharply with the PlasmaBlade and surrounding small vessels and lymphatics were controlled with clips.  Ex vivo counts on these nodes ranged from [phone].  These were sent as sentinel nodes numbers 1 through 3.  No other hot or palpable nodes were identified in the left axilla.  Hemostasis was achieved using the PlasmaBlade.  The wound was irrigated with saline.  A small stab incision was made near the anterior axillary line inferior to the operative bed.  A hemostat was placed through this opening and used to bring a 19 [place] round Blake drain into the operative bed.  The drain was curled along the chest wall.  The drain was anchored to the skin with a 3-0 nylon stitch.  Next the superior and inferior skin flaps were grossly reapproximated with interrupted 3-0 Vicryl stitches.  The skin was closed with a running 4 Monocryl subcuticular stitches.  Dermabond dressings and sterile drain dressings were applied drapes were placed to bulb suction and there was a good seal.  The skin flaps appeared healthy.  The patient tolerated the procedure well.  At the end of the case all needle sponge and instrument counts were correct.  The patient was then awakened and taken to recovery in stable condition.  PLAN OF CARE: Admit for overnight observation  PATIENT DISPOSITION:  PACU - hemodynamically stable.   Delay start of Pharmacological VTE agent (>24hrs) due to surgical blood loss or risk of bleeding: no

## 2022-07-19 NOTE — Interval H&P Note (Signed)
History and Physical Interval Note:  07/19/2022 9:25 AM  [name]  has presented today for surgery, with the diagnosis of BILATERAL BREAST CANCER.  The various methods of treatment have been discussed with the patient and family. After consideration of risks, benefits and other options for treatment, the patient has consented to  Procedure(s): BILATERAL TOTAL MASTECTOMY (Bilateral) BILATERAL SENTINEL NODE BIOPSY (Bilateral) as a surgical intervention.  The patient's history has been reviewed, patient examined, no change in status, stable for surgery.  I have reviewed the patient's chart and labs.  Questions were answered to the patient's satisfaction.     Autumn Messing III

## 2022-07-19 NOTE — Transfer of Care (Signed)
Immediate Anesthesia Transfer of Care Note  Patient: [name]  Procedure(s) Performed: BILATERAL TOTAL MASTECTOMY (Bilateral: Breast) BILATERAL SENTINEL NODE BIOPSY (Bilateral: Axilla)  Patient Location: PACU  Anesthesia Type:GA combined with regional for post-op pain  Level of Consciousness: awake, alert , and oriented  Airway & Oxygen Therapy: Patient Spontanous Breathing and Patient connected to face mask oxygen  Post-op Assessment: Report given to RN, Post -op Vital signs reviewed and stable, and Patient moving all extremities X 4  Post vital signs: Reviewed and stable  Last Vitals:  Vitals Value Taken Time  BP 107/65 07/19/22 1317  Temp    Pulse 97 07/19/22 1321  Resp 13 07/19/22 1321  SpO2 97 % 07/19/22 1321  Vitals shown include unvalidated device data.  Last Pain:  Vitals:   07/19/22 1001  TempSrc:   PainSc: 0-No pain      Patients Stated Pain Goal: 0 (74/14/23 9532)  Complications: No notable events documented.

## 2022-07-19 NOTE — Anesthesia Procedure Notes (Signed)
Procedure Name: Intubation Date/Time: 07/19/2022 10:38 AM  Performed by: Inda Coke, CRNAPre-anesthesia Checklist: Patient identified, Emergency Drugs available, Suction available, Timeout performed and Patient being monitored Patient Re-evaluated:Patient Re-evaluated prior to induction Oxygen Delivery Method: Circle system utilized Preoxygenation: Pre-oxygenation with 100% oxygen Induction Type: IV induction Ventilation: Mask ventilation without difficulty Laryngoscope Size: Mac and 3 Grade View: Grade I Tube type: Oral Tube size: 7.0 mm Number of attempts: 1 Airway Equipment and Method: Stylet Placement Confirmation: ETT inserted through vocal cords under direct vision, positive ETCO2, CO2 detector and breath sounds checked- equal and bilateral Secured at: 22 cm Tube secured with: Tape Dental Injury: Teeth and Oropharynx as per pre-operative assessment

## 2022-07-19 NOTE — Progress Notes (Signed)
Questioned Patient and family about meds due to amount informed takes elavil, oxy '10mg'$ , lyrica and xanax at home at same time.

## 2022-07-19 NOTE — H&P (Signed)
REFERRING PHYSICIAN: Rulon Eisenmenger, MD  PROVIDER: Landry Corporal, MD  MRN: [id number] DOB: April 06, 1961 Subjective  Chief Complaint: Breast Cancer   History of Present Illness: [name] is a 62 y.o. female who is seen today as an office consultation for evaluation of Breast Cancer .  We are asked to see the patient in consultation by [name] to evaluate her for new breast cancers. The patient is a 62 year old white female who recently went for a routine screening mammogram. At that time she was found to have multiple masses ranging from 1.4 cm to 2.1 cm and calcifications in the upper outer quadrant of the left breast as well as the upper central portion of the left breast. 2 of these areas were biopsied and came back as a grade 1 invasive ductal cancer in the upper outer quadrant and a grade 2 invasive lobular cancer in the upper central breast. She was also found to have distortion and a mass measuring 3 cm in the upper outer quadrant of the right breast as well as some other calcifications in the lower outer quadrant of the right breast. The distortion and mass were biopsied and came back as grade 2 invasive ductal cancer. The lymph nodes on both sides look normal. The left-sided cancer was reported as ER positive and PR negative and HER2 negative with a Ki-67 of 1% for both cancers. The right breast cancer was reported as triple negative with a Ki-67 of 0%. She does have hypertension and diabetes and quit smoking many years ago  Review of Systems: A complete review of systems was obtained from the patient. I have reviewed this information and discussed as appropriate with the patient. See HPI as well for other ROS.  ROS  Medical History: Past Medical History: Diagnosis Date Arthritis Hypertension  Patient Active Problem List Diagnosis Essential hypertension Malignant neoplasm of overlapping sites of both breasts in female, estrogen receptor negative Malignant neoplasm of  upper-outer quadrant of right breast in female, estrogen receptor negative  Past Surgical History: Procedure Laterality Date Back Surgery N/A 1989, 1990, 2006 CHOLECYSTECTOMY N/A 1998 HYSTERECTOMY N/A Partial hysterectomy   Allergies Allergen Reactions Codeine Phosphate Unknown REACTION: unspecified Simvastatin Other (See Comments) REACTION: muscle' \\T'$ \ joint pains Sulfamethoxazole Unknown REACTION: unspecified  Current Outpatient Medications on File Prior to Visit Medication Sig Dispense Refill albuterol 90 mcg/actuation inhaler Inhale 2 inhalations into the lungs every 6 (six) hours as needed for Shortness of Breath or Wheezing amLODIPine (NORVASC) 10 MG tablet Take 10 mg by mouth once daily OZEMPIC 1 mg/dose (4 mg/3 mL) pen injector Inject 1 mg subcutaneously once a week dexlansoprazole (DEXILANT) 60 mg DR capsule Take 60 mg by mouth once daily loratadine (CLARITIN) 10 mg tablet Take 10 mg by mouth once daily  No current facility-administered medications on file prior to visit.  Family History Problem Relation Age of Onset High blood pressure (Hypertension) Mother Diabetes Mother Myocardial Infarction (Heart attack) Mother High blood pressure (Hypertension) Father Myocardial Infarction (Heart attack) Father Diabetes Father Breast cancer Sister   Social History  Tobacco Use Smoking Status Every Day Packs/day: 1 Types: Cigarettes Smokeless Tobacco Never   Social History  Socioeconomic History Marital status: Unknown Tobacco Use Smoking status: Every Day Packs/day: 1 Types: Cigarettes Smokeless tobacco: Never Vaping Use Vaping Use: Never used Substance and Sexual Activity Alcohol use: Not Currently Drug use: Never  Objective: There were no vitals filed for this visit. There is no height or weight on file to calculate BMI.  Physical Exam Vitals reviewed. Constitutional: General: She is not in acute distress. Appearance: Normal  appearance. HENT: Head: Normocephalic and atraumatic. Right Ear: External ear normal. Left Ear: External ear normal. Nose: Nose normal. Mouth/Throat: Mouth: Mucous membranes are moist. Pharynx: Oropharynx is clear. Eyes: General: No scleral icterus. Extraocular Movements: Extraocular movements intact. Conjunctiva/sclera: Conjunctivae normal. Pupils: Pupils are equal, round, and reactive to light. Cardiovascular: Rate and Rhythm: Normal rate and regular rhythm. Pulses: Normal pulses. Heart sounds: Murmur heard. Pulmonary: Effort: Pulmonary effort is normal. No respiratory distress. Breath sounds: Normal breath sounds. Abdominal: General: Bowel sounds are normal. Palpations: Abdomen is soft. Tenderness: There is no abdominal tenderness. Musculoskeletal: General: No swelling, tenderness or deformity. Normal range of motion. Cervical back: Normal range of motion and neck supple. Skin: General: Skin is warm and dry. Coloration: Skin is not jaundiced. Neurological: General: No focal deficit present. Mental Status: She is alert and oriented to person, place, and time. Psychiatric: Mood and Affect: Mood normal. Behavior: Behavior normal.  Breast: There is bruising in the upper outer and upper central left breast and bruising in the upper outer quadrant of the right breast. Other than this there is no palpable mass in either breast. There is no palpable axillary, supraclavicular, or cervical lymphadenopathy.  Labs, Imaging and Diagnostic Testing:  Assessment and Plan:  Diagnoses and all orders for this visit:  Malignant neoplasm of overlapping sites of both breasts in female, estrogen receptor negative    The patient appears to have multiple cancers in both breasts. At this point given the extent of disease my recommendation would be for bilateral mastectomies. She would be a good candidate for sentinel node biopsy on both sides as well. I have discussed with her in detail  the risks and benefits of the operation as well as some of the technical aspects and she understands and wishes to proceed. She is interested in reconstruction so I will refer her to plastic surgery to talk about the options. She will also meet with medical and radiation oncology to discuss adjuvant therapy. We will proceed with surgical planning.

## 2022-07-19 NOTE — Anesthesia Procedure Notes (Signed)
Anesthesia Regional Block: Pectoralis block   Pre-Anesthetic Checklist: , timeout performed,  Correct Patient, Correct Site, Correct Laterality,  Correct Procedure, Correct Position, site marked,  Risks and benefits discussed,  Surgical consent,  Pre-op evaluation,  At surgeon's request and post-op pain management  Laterality: Right  Prep: chloraprep       Needles:  Injection technique: Single-shot      Needle Length: 9cm  Needle Gauge: 22     Additional Needles: Arrow StimuQuik ECHO Echogenic Stimulating PNB Needle  Procedures:,,,, ultrasound used (permanent image in chart),,    Narrative:  Start time: 07/19/2022 9:44 AM End time: 07/19/2022 9:49 AM Injection made incrementally with aspirations every 5 mL.  Performed by: Personally  Anesthesiologist: Oleta Mouse, MD

## 2022-07-20 ENCOUNTER — Encounter (HOSPITAL_COMMUNITY): Payer: Self-pay | Admitting: General Surgery

## 2022-07-20 DIAGNOSIS — Z171 Estrogen receptor negative status [ER-]: Secondary | ICD-10-CM | POA: Diagnosis not present

## 2022-07-20 DIAGNOSIS — Z170 Estrogen receptor positive status [ER+]: Secondary | ICD-10-CM | POA: Diagnosis not present

## 2022-07-20 DIAGNOSIS — Z87891 Personal history of nicotine dependence: Secondary | ICD-10-CM | POA: Diagnosis not present

## 2022-07-20 DIAGNOSIS — Z7985 Long-term (current) use of injectable non-insulin antidiabetic drugs: Secondary | ICD-10-CM | POA: Diagnosis not present

## 2022-07-20 DIAGNOSIS — C50812 Malignant neoplasm of overlapping sites of left female breast: Secondary | ICD-10-CM | POA: Diagnosis not present

## 2022-07-20 DIAGNOSIS — I10 Essential (primary) hypertension: Secondary | ICD-10-CM | POA: Diagnosis not present

## 2022-07-20 DIAGNOSIS — C50411 Malignant neoplasm of upper-outer quadrant of right female breast: Secondary | ICD-10-CM | POA: Diagnosis not present

## 2022-07-20 DIAGNOSIS — K219 Gastro-esophageal reflux disease without esophagitis: Secondary | ICD-10-CM | POA: Diagnosis not present

## 2022-07-20 DIAGNOSIS — E119 Type 2 diabetes mellitus without complications: Secondary | ICD-10-CM | POA: Diagnosis not present

## 2022-07-20 LAB — GLUCOSE, CAPILLARY: Glucose-Capillary: 224 mg/dL — ABNORMAL HIGH (ref 70–99)

## 2022-07-20 NOTE — Progress Notes (Signed)
AVS given and reviewed with pt. Medications discussed. All questions answered to satisfaction. Pt verbalized understanding of information given, including JP drain care. Pt escorted off the unit with all belongings via wheelchair by volunteer services.  Per pt and family at bedside, JP drain output from last night and this morning is as follows:  8PM 2/5 Left 34m Right 128m 10PM 2/5 Left 1066might 67m58m AM 2/6 Left 25mL65mht 25mL 67mM 2/6 Left 30mL R53m 30mL9m

## 2022-07-20 NOTE — Plan of Care (Signed)
Pt alert and oriented x 4. Up adlib to bsc. 1 assist to bathroom due to IV pole. Hypotension at midnight  vitals. MD aware. This am BP stable. Right JP 45 output. Left JP 50 output.  Problem: Education: Goal: Knowledge of General Education information will improve Description: Including pain rating scale, medication(s)/side effects and non-pharmacologic comfort measures Outcome: Progressing   Problem: Health Behavior/Discharge Planning: Goal: Ability to manage health-related needs will improve Outcome: Progressing   Problem: Clinical Measurements: Goal: Ability to maintain clinical measurements within normal limits will improve Outcome: Progressing Goal: Will remain free from infection Outcome: Progressing Goal: Diagnostic test results will improve Outcome: Progressing Goal: Respiratory complications will improve Outcome: Progressing Goal: Cardiovascular complication will be avoided Outcome: Progressing   Problem: Activity: Goal: Risk for activity intolerance will decrease Outcome: Progressing   Problem: Nutrition: Goal: Adequate nutrition will be maintained Outcome: Progressing   Problem: Coping: Goal: Level of anxiety will decrease Outcome: Progressing   Problem: Elimination: Goal: Will not experience complications related to bowel motility Outcome: Progressing Goal: Will not experience complications related to urinary retention Outcome: Progressing   Problem: Pain Managment: Goal: General experience of comfort will improve Outcome: Progressing   Problem: Safety: Goal: Ability to remain free from injury will improve Outcome: Progressing   Problem: Skin Integrity: Goal: Risk for impaired skin integrity will decrease Outcome: Progressing

## 2022-07-20 NOTE — Progress Notes (Signed)
Page sent to Dr. Redmond Pulling regarding BP. Pt asymptomatic no bleeding noted at the incision sites. Approx 30 ml per drain noted so far this shift. Pt had taken oxycontin '40mg'$  at hs after changes in medications based on home dose. MD advised bp may be related to narcotic. No new orders received   07/20/22 0121  Assess: MEWS Score  Temp 98.1 F (36.7 C)  BP (!) 87/64 (MD aware DR. Wilson)  MAP (mmHg) 73  Pulse Rate 91  Resp 18  SpO2 96 %  O2 Device Nasal Cannula  O2 Flow Rate (L/min) 3 L/min

## 2022-07-20 NOTE — Discharge Summary (Signed)
Patient ID: [name] [id number] Dec 19, 1960 62 y.o.  Admit date: 07/19/2022 Discharge date: 07/20/2022  Admitting Diagnosis: Bilateral breast canacer  Discharge Diagnosis Patient Active Problem List   Diagnosis Date Noted   Bilateral breast cancer ([place]) 07/19/2022   Atypical pneumonia 06/19/2022   Acute hypoxemic respiratory failure ([place]) 06/19/2022   Severe sepsis ([place]) 06/19/2022   AKI (acute kidney injury) ([place]) 06/19/2022   Acute pyelonephritis 29/47/6546   Acute metabolic encephalopathy 50/35/4656   Genetic testing 06/03/2022   Malignant neoplasm of upper-outer quadrant of right breast in female, estrogen receptor negative ([place]) 05/24/2022   Malignant neoplasm of overlapping sites of left breast in female, estrogen receptor positive ([place]) 05/24/2022   Acute respiratory failure due to COVID-19 [facility]) 07/13/2020   Acute encephalopathy 07/13/2020   Renal insufficiency 07/13/2020   Neuritis 04/23/2015   Neuralgia 04/23/2015   Neuropathy 04/23/2015   Hyperalgesia 04/23/2015   Allodynia 04/23/2015   Obese 08/02/2012   Anxiety 06/07/2011   Hyperlipidemia    NECK PAIN 04/20/2010   Essential hypertension 11/29/2008   TOBACCO USE 09/30/2007   SYNDROME, CHRONIC PAIN 03/24/2007   MENOPAUSAL DISORDER 03/24/2007   GERD 01/04/2007   CHOLELITHIASIS 01/04/2007    Consultants none  Reason for Admission:  We are asked to see the patient in consultation by [name] to evaluate her for new breast cancers. The patient is a 62 year old white female who recently went for a routine screening mammogram. At that time she was found to have multiple masses ranging from 1.4 cm to 2.1 cm and calcifications in the upper outer quadrant of the left breast as well as the upper central portion of the left breast. 2 of these areas were biopsied and came back as a grade 1 invasive ductal cancer in the upper outer quadrant and a grade 2 invasive lobular cancer in the upper central breast. She  was also found to have distortion and a mass measuring 3 cm in the upper outer quadrant of the right breast as well as some other calcifications in the lower outer quadrant of the right breast. The distortion and mass were biopsied and came back as grade 2 invasive ductal cancer. The lymph nodes on both sides look normal. The left-sided cancer was reported as ER positive and PR negative and HER2 negative with a Ki-67 of 1% for both cancers. The right breast cancer was reported as triple negative with a Ki-67 of 0%. She does have hypertension and diabetes and quit smoking many years ago   Procedures Dr. Marlou Starks 2/5 BILATERAL TOTAL MASTECTOMY (Bilateral) BILATERAL SENTINEL NODE BIOPSY (Bilateral)  Hospital Course:  The patient was admitted and underwent the above procedure.  She tolerated this well.  Her pain was well controlled and her drains were working appropriately on POD 1.  She was voiding and tolerating a regular diet.  She was stable for DC home  Physical Exam: Chest: Bilateral wounds are c/d/I, no edema or evidence of hematoma.  JP drains are bother serosang with appropriate output.  Dressings/binder in place.  Allergies as of 07/20/2022       Reactions   Codeine Phosphate Other (See Comments)   Gi upset   Simvastatin    REACTION: muscle' \\T'$ \ joint pains   Sulfamethoxazole Hives   Tape Rash   Adhesive. Pt states "it takes skin off" Not even paper tape        Medication List     TAKE these medications    albuterol 108 (90 Base)  MCG/ACT inhaler Commonly known as: VENTOLIN HFA Inhale 2 puffs into the lungs every 6 (six) hours as needed for wheezing or shortness of breath.   ALPRAZolam 0.5 MG tablet Commonly known as: XANAX Take 1 mg by mouth at bedtime.   amitriptyline 50 MG tablet Commonly known as: ELAVIL Take 50 mg by mouth at bedtime.   amLODipine 10 MG tablet Commonly known as: NORVASC Take 10 mg by mouth daily.   ezetimibe 10 MG tablet Commonly known as:  ZETIA Take 10 mg by mouth daily.   lidocaine 5 % Commonly known as: Lidoderm Place 3 patches onto the skin daily. Remove & Discard patch within 12 hours or as directed by MD What changed:  when to take this reasons to take this additional instructions   loratadine 10 MG tablet Commonly known as: CLARITIN Take 10 mg by mouth daily. For allergies   metFORMIN 500 MG 24 hr tablet Commonly known as: GLUCOPHAGE-XR Take 1,000 mg by mouth 2 (two) times daily.   Mounjaro 7.5 MG/0.5ML Pen Generic drug: tirzepatide Inject 7.5 mg into the skin once a week. Once a week on Monday. Last dose 1/15.   multivitamin with minerals tablet Take 1 tablet by mouth daily. [place]stone w/ Iron   olmesartan 20 MG tablet Commonly known as: BENICAR Take 1 tablet (20 mg total) by mouth daily. What changed: how much to take   oxycodone-acetaminophen 2.5-325 MG tablet Commonly known as: PERCOCET Take 1 tablet by mouth every 6 (six) hours as needed for pain.   pantoprazole 40 MG tablet Commonly known as: PROTONIX Take 40 mg by mouth daily.   pregabalin 100 MG capsule Commonly known as: LYRICA Take 100 mg by mouth 2 (two) times daily.   Xtampza ER 36 MG C12a Generic drug: oxyCODONE ER Take 36 mg by mouth in the morning and at bedtime.          Follow-up Information     Autumn Messing III, MD Follow up on 08/04/2022.   Specialty: General Surgery Why: 2:30pm, arrive by 2:15pm Contact information: [address] [place] [address] [phone]                 Signed: Saverio Danker, [facility] 07/20/2022, 10:09 AM Please see Amion for pager number during day hours 7:00am-4:30pm, 7-11:30am on Weekends

## 2022-07-20 NOTE — Discharge Instructions (Signed)
[facility] [phone]  MASTECTOMY: POST OP INSTRUCTIONS  Always review your discharge instruction sheet given to you by the facility where your surgery was performed. IF YOU HAVE DISABILITY OR FAMILY LEAVE FORMS, YOU MUST BRING THEM TO THE OFFICE FOR PROCESSING.   DO NOT GIVE THEM TO YOUR DOCTOR. A prescription for pain medication may be given to you upon discharge.  Take your pain medication as prescribed, if needed.  If narcotic pain medicine is not needed, then you may take acetaminophen (Tylenol) or ibuprofen (Advil) as needed. Take your usually prescribed medications unless otherwise directed. If you need a refill on your pain medication, please contact your pharmacy.  They will contact our office to request authorization.  Prescriptions will not be filled after 5pm or on week-ends. You should follow a light diet the first few days after arrival home, such as soup and crackers, etc.  Resume your normal diet the day after surgery. Most patients will experience some swelling and bruising on the chest and underarm.  Ice packs will help.  Swelling and bruising can take several days to resolve.  It is common to experience some constipation if taking pain medication after surgery.  Increasing fluid intake and taking a stool softener (such as Colace) will usually help or prevent this problem from occurring.  A mild laxative (Milk of Magnesia or Miralax) should be taken according to package instructions if there are no bowel movements after 48 hours. Unless discharge instructions indicate otherwise, leave your bandage dry and in place until your next appointment in 3-5 days.  You may take a limited sponge bath.  No tube baths or showers until the drains are removed.  You may have steri-strips (small skin tapes) in place directly over the incision.  These strips should be left on the skin for 7-10 days.  If your surgeon used skin glue on the incision, you may shower in 24 hours.   The glue will flake off over the next 2-3 weeks.  Any sutures or staples will be removed at the office during your follow-up visit. DRAINS:  If you have drains in place, it is important to keep a list of the amount of drainage produced each day in your drains.  Before leaving the hospital, you should be instructed on drain care.  Call our office if you have any questions about your drains. ACTIVITIES:  You may resume regular (light) daily activities beginning the next day--such as daily self-care, walking, climbing stairs--gradually increasing activities as tolerated.  You may have sexual intercourse when it is comfortable.  Refrain from any heavy lifting or straining until approved by your doctor. You may drive when you are no longer taking prescription pain medication, you can comfortably wear a seatbelt, and you can safely maneuver your car and apply brakes. RETURN TO WORK:  __________________________________________________________ You should see your doctor in the office for a follow-up appointment approximately 3-5 days after your surgery.  Your doctor's nurse will typically make your follow-up appointment when she calls you with your pathology report.  Expect your pathology report 2-3 business days after your surgery.  You may call to check if you do not hear from us after three days.   OTHER INSTRUCTIONS: ______________________________________________________________________________________________ ____________________________________________________________________________________________ WHEN TO CALL YOUR DOCTOR: Fever over 101.0 Nausea and/or vomiting Extreme swelling or bruising Continued bleeding from incision. Increased pain, redness, or drainage from the incision. The clinic staff is available to answer your questions during regular business hours.  Please don't hesitate   to call and ask to speak to one of the nurses for clinical concerns.  If you have a medical emergency, go to the  nearest emergency room or call 911.  A surgeon from [facility] is always on call at the hospital. [address], [address], [place], [place]  [address] ? [address] [address], [place], [place]   [address] [phone] ? [phone] ? FAX [phone] Web site: [email or web address]  

## 2022-07-20 NOTE — Progress Notes (Signed)
Pt discharged to home by Racheal Patches (SWOT RN). See nurse's note for details.

## 2022-07-23 ENCOUNTER — Encounter: Payer: Self-pay | Admitting: *Deleted

## 2022-07-28 ENCOUNTER — Encounter: Payer: Self-pay | Admitting: *Deleted

## 2022-07-28 NOTE — Assessment & Plan Note (Signed)
Screening mammogram detected bilateral breast abnormalities. 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%, PR 0%, HER2 negative, Ki-67 less than 1%), 1.4 cm mass (not biopsied) axilla negative Right breast: Calcifications 3 cm biopsy: Grade 2 IDC with DCIS ER 0%, PR 0%, HER2 negative, Ki-67 0%, axilla negative, additional distortion to be biopsied today   Treatment plan: Bilateral mastectomies 07/19/2022: Left mastectomy: Grade 2 IDC 6.8 cm with intermediate grade DCIS, margins negative, ER 70%, PR 0%, HER2 negative, Ki-67 less than 1% 0/5 lymph nodes; right mastectomy: Grade 2 IDC 8.5 cm and 1.5 cm with high-grade DCIS, margins negative, ER 0%, PR 0%, HER2 negative, Ki-67 0% 0/3 lymph nodes Adjuvant chemotherapy with dose dense Adriamycin and Cytoxan followed by Taxol. Postmastectomy radiation Antiestrogen therapy   Patient needs an extremely unhealthy diet rich in cholesterol and fats and sugar.  She does not seem to have any interest in changing her diet.   Chemotherapy Counseling: I discussed the risks and benefits of chemotherapy including the risks of nausea/ vomiting, risk of infection from low WBC count, fatigue due to chemo or anemia, bruising or bleeding due to low platelets, mouth sores, loss/ change in taste and decreased appetite. Liver and kidney function will be monitored through out chemotherapy as abnormalities in liver and kidney function may be a side effect of treatment. Cardiac dysfunction due to Adriamycin and neuropathy risk from Taxol were discussed in detail. Risk of permanent bone marrow dysfunction and leukemia due to chemo were also discussed.  Return to clinic in 3 weeks to start chemo

## 2022-07-29 ENCOUNTER — Other Ambulatory Visit: Payer: Self-pay

## 2022-07-29 ENCOUNTER — Inpatient Hospital Stay: Payer: BC Managed Care – PPO | Attending: Hematology and Oncology | Admitting: Hematology and Oncology

## 2022-07-29 VITALS — BP 119/72 | HR 100 | Temp 97.9°F | Resp 16 | Wt 213.1 lb

## 2022-07-29 DIAGNOSIS — C50411 Malignant neoplasm of upper-outer quadrant of right female breast: Secondary | ICD-10-CM | POA: Diagnosis not present

## 2022-07-29 DIAGNOSIS — Z9013 Acquired absence of bilateral breasts and nipples: Secondary | ICD-10-CM | POA: Insufficient documentation

## 2022-07-29 DIAGNOSIS — E119 Type 2 diabetes mellitus without complications: Secondary | ICD-10-CM | POA: Insufficient documentation

## 2022-07-29 DIAGNOSIS — Z79899 Other long term (current) drug therapy: Secondary | ICD-10-CM | POA: Insufficient documentation

## 2022-07-29 DIAGNOSIS — Z171 Estrogen receptor negative status [ER-]: Secondary | ICD-10-CM | POA: Diagnosis not present

## 2022-07-29 DIAGNOSIS — C50812 Malignant neoplasm of overlapping sites of left female breast: Secondary | ICD-10-CM | POA: Diagnosis not present

## 2022-07-29 DIAGNOSIS — Z170 Estrogen receptor positive status [ER+]: Secondary | ICD-10-CM | POA: Insufficient documentation

## 2022-07-29 MED ORDER — PROCHLORPERAZINE MALEATE 10 MG PO TABS: 10.0000 mg | ORAL_TABLET | Freq: Four times a day (QID) | ORAL | 1 refills | Status: DC | PRN

## 2022-07-29 MED ORDER — LIDOCAINE-PRILOCAINE 2.5-2.5 % EX CREA: TOPICAL_CREAM | CUTANEOUS | 3 refills | Status: DC

## 2022-07-29 MED ORDER — ONDANSETRON HCL 8 MG PO TABS: 8.0000 mg | ORAL_TABLET | Freq: Three times a day (TID) | ORAL | 1 refills | Status: DC | PRN

## 2022-07-29 MED ORDER — CEPHALEXIN 500 MG PO CAPS: 500.0000 mg | ORAL_CAPSULE | Freq: Two times a day (BID) | ORAL | 0 refills | Status: DC

## 2022-07-29 NOTE — Progress Notes (Addendum)
Patient Care Team: Deland Pretty, MD as PCP - General (Internal Medicine) Rockwell Germany, RN as Oncology Nurse Navigator Mauro Kaufmann, RN as Oncology Nurse Navigator Nicholas Lose, MD as Consulting Physician (Hematology and Oncology) Jovita Kussmaul, MD as Consulting Physician (General Surgery) Eppie Gibson, MD as Attending Physician (Radiation Oncology)  DIAGNOSIS:  Encounter Diagnoses  Name Primary?   Malignant neoplasm of upper-outer quadrant of right breast in female, estrogen receptor negative ([place])    Malignant neoplasm of overlapping sites of left breast in female, estrogen receptor positive ([place]) Yes    SUMMARY OF ONCOLOGIC HISTORY: Oncology History  Malignant neoplasm of upper-outer quadrant of right breast in female, estrogen receptor negative ([place])  05/14/2022 Initial Diagnosis   Screening mammogram detected bilateral breast abnormalities. 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%, PR 0%, HER2 negative, Ki-67 less than 1%), 1.4 cm mass (not biopsied) axilla negative Right breast: Calcifications 3 cm biopsy: Grade 2 IDC with DCIS ER 0%, PR 0%, HER2 negative, Ki-67 0%, axilla negative, additional distortion to be biopsied today   06/02/2022 Genetic Testing   [facility] CancerNext-Expanded Panel+RNA was Negative. Report date is 06/03/2022.  The CancerNext-Expanded gene panel offered by [facility] and includes sequencing, rearrangement, and RNA analysis for the following 77 genes: AIP, ALK, APC, ATM, AXIN2, BAP1, BARD1, BLM, BMPR1A, BRCA1, BRCA2, BRIP1, CDC73, CDH1, CDK4, CDKN1B, CDKN2A, CHEK2, CTNNA1, DICER1, FANCC, FH, FLCN, GALNT12, KIF1B, LZTR1, MAX, MEN1, MET, MLH1, MSH2, MSH3, MSH6, MUTYH, NBN, NF1, NF2, NTHL1, PALB2, PHOX2B, PMS2, POT1, PRKAR1A, PTCH1, PTEN, RAD51C, RAD51D, RB1, RECQL, RET, SDHA,  SDHAF2, SDHB, SDHC, SDHD, SMAD4, SMARCA4, SMARCB1, SMARCE1, STK11, SUFU, TMEM127, TP53, TSC1, TSC2, VHL and XRCC2 (sequencing and deletion/duplication); EGFR, EGLN1, HOXB13, KIT, MITF, PDGFRA, POLD1, and POLE (sequencing only); EPCAM and GREM1 (deletion/duplication only).     07/19/2022 Surgery   Bilateral mastectomies:  Left mastectomy: Grade 2 IDC 6.8 cm with intermediate grade DCIS, margins negative, ER 100%, PR 5%, HER2 negative (2+ IHC, FISH neg), Ki-67 less than 1% 0/5 lymph nodes;  Right mastectomy: Grade 2 IDC 8.5 cm and 1.5 cm with high-grade DCIS, margins negative, ER 95% (on biopsy it was read as negative), PR 0%, HER2 negative, Ki-67 15% 0/3 lymph nodes   08/20/2022 -  Chemotherapy   Patient is on Treatment Plan : BREAST Adjuvant CMF IV q21d     Malignant neoplasm of overlapping sites of left breast in female, estrogen receptor positive ([place])  05/24/2022 Initial Diagnosis   Malignant neoplasm of overlapping sites of left breast in female, estrogen receptor positive ([place])   05/26/2022 Cancer Staging   Staging form: Breast, AJCC 8th Edition - Clinical: Stage IB (cT2, cN0, cM0, G2, ER+, PR+, HER2-) - Signed by Nicholas Lose, MD on 05/26/2022 Histologic grading system: 3 grade system   06/02/2022 Genetic Testing   [facility] CancerNext-Expanded Panel+RNA was Negative. Report date is 06/03/2022.  The CancerNext-Expanded gene panel offered by [facility] and includes sequencing, rearrangement, and RNA analysis for the following 77 genes: AIP, ALK, APC, ATM, AXIN2, BAP1, BARD1, BLM, BMPR1A, BRCA1, BRCA2, BRIP1, CDC73, CDH1, CDK4, CDKN1B, CDKN2A, CHEK2, CTNNA1, DICER1, FANCC, FH, FLCN, GALNT12, KIF1B, LZTR1, MAX, MEN1, MET, MLH1, MSH2, MSH3, MSH6, MUTYH, NBN, NF1, NF2, NTHL1, PALB2, PHOX2B, PMS2, POT1, PRKAR1A, PTCH1, PTEN, RAD51C, RAD51D, RB1, RECQL, RET, SDHA, SDHAF2, SDHB, SDHC, SDHD, SMAD4,  SMARCA4, SMARCB1, SMARCE1, STK11, SUFU, TMEM127, TP53, TSC1, TSC2, VHL and XRCC2 (sequencing and  deletion/duplication); EGFR, EGLN1, HOXB13, KIT, MITF, PDGFRA, POLD1, and POLE (sequencing only); EPCAM and GREM1 (deletion/duplication only).     08/20/2022 -  Chemotherapy   Patient is on Treatment Plan : BREAST Adjuvant CMF IV q21d       CHIEF COMPLIANT: Follow-up after surgery  INTERVAL HISTORY: [name] is a 62 y.o. female is here because of recent diagnosis of bilateral breast cancers. She presents to the clinic for a follow-up. She complains of redness under the breast area, and some swelling under the axilla. She states that it rubs up against the bra and it gets scaly and itchy. Her concern was could she take a shower. She is diabetic but does not follow any diabetic diet.  She tells me that her sugars are not under good control and she is also concerned about her heart because there is a lot of family history of heart disease and the fact that she has high cholesterol and high risk for CAD herself.   ALLERGIES:  is allergic to codeine phosphate, simvastatin, sulfamethoxazole, and tape.  MEDICATIONS:  Current Outpatient Medications  Medication Sig Dispense Refill   albuterol (VENTOLIN HFA) 108 (90 Base) MCG/ACT inhaler Inhale 2 puffs into the lungs every 6 (six) hours as needed for wheezing or shortness of breath. 6.7 g 0   ALPRAZolam (XANAX) 0.5 MG tablet Take 1 mg by mouth at bedtime.  2   amitriptyline (ELAVIL) 50 MG tablet Take 50 mg by mouth at bedtime.     amLODipine (NORVASC) 10 MG tablet Take 10 mg by mouth daily.     cephALEXin (KEFLEX) 500 MG capsule Take 1 capsule (500 mg total) by mouth 2 (two) times daily. 14 capsule 0   ezetimibe (ZETIA) 10 MG tablet Take 10 mg by mouth daily.     lidocaine (LIDODERM) 5 % Place 3 patches onto the skin daily. Remove & Discard patch within 12 hours or as directed by MD (Patient taking differently: Place 3 patches onto the skin daily as needed (pain).) 90 patch 11   loratadine (CLARITIN) 10 MG tablet Take 10 mg by mouth daily. For  allergies     metFORMIN (GLUCOPHAGE-XR) 500 MG 24 hr tablet Take 1,000 mg by mouth 2 (two) times daily.     Multiple Vitamins-Minerals (MULTIVITAMIN WITH MINERALS) tablet Take 1 tablet by mouth daily. [place]stone w/ Iron     olmesartan (BENICAR) 20 MG tablet Take 1 tablet (20 mg total) by mouth daily. (Patient taking differently: Take 40 mg by mouth daily.)     oxyCODONE ER (XTAMPZA ER) 36 MG C12A Take 36 mg by mouth in the morning and at bedtime.     oxycodone-acetaminophen (PERCOCET) 2.5-325 MG tablet Take 1 tablet by mouth every 6 (six) hours as needed for pain.     pantoprazole (PROTONIX) 40 MG tablet Take 40 mg by mouth daily.     pregabalin (LYRICA) 100 MG capsule Take 100 mg by mouth 2 (two) times daily.     lidocaine-prilocaine (EMLA) cream Apply to affected area once 30 g 3   MOUNJARO 7.5 MG/0.5ML Pen Inject 7.5 mg into the skin once a week. Once a week on Monday. Last dose 1/15. (Patient not taking: Reported on 07/29/2022)     ondansetron (ZOFRAN) 8 MG tablet Take 1 tablet (8 mg total) by mouth every 8 (eight) hours as needed for nausea or vomiting. Start on the third day  after chemotherapy. 30 tablet 1   prochlorperazine (COMPAZINE) 10 MG tablet Take 1 tablet (10 mg total) by mouth every 6 (six) hours as needed for nausea or vomiting. 30 tablet 1   No current facility-administered medications for this visit.    PHYSICAL EXAMINATION: ECOG PERFORMANCE STATUS: 1 - Symptomatic but completely ambulatory  Vitals:   07/29/22 0956  BP: 119/72  Pulse: 100  Resp: 16  Temp: 97.9 F (36.6 C)  SpO2: 90%   Filed Weights   07/29/22 0956  Weight: 213 lb 2 oz (96.7 kg)      LABORATORY DATA:  I have reviewed the data as listed    Latest Ref Rng & Units 07/13/2022    9:48 AM 06/21/2022    7:43 AM 06/20/2022    3:36 AM  CMP  Glucose 70 - 99 mg/dL 206  130  126   BUN 8 - 23 mg/dL 18  16  25   $ Creatinine 0.44 - 1.00 mg/dL 1.11  1.10  1.44   Sodium 135 - 145 mmol/L 138  135  137    Potassium 3.5 - 5.1 mmol/L 4.7  4.3  4.7   Chloride 98 - 111 mmol/L 100  99  102   CO2 22 - 32 mmol/L 26  26  25   $ Calcium 8.9 - 10.3 mg/dL 9.0  9.0  8.4     Lab Results  Component Value Date   WBC 5.3 07/13/2022   HGB 13.2 07/13/2022   HCT 42.2 07/13/2022   MCV 92.1 07/13/2022   PLT 226 07/13/2022   NEUTROABS 9.6 (H) 06/18/2022    ASSESSMENT & PLAN:  Malignant neoplasm of upper-outer quadrant of right breast in female, estrogen receptor negative ([place]) Screening mammogram detected bilateral breast abnormalities. 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%, PR 0%, HER2 negative, Ki-67 less than 1%), 1.4 cm mass (not biopsied) axilla negative Right breast: Calcifications 3 cm biopsy: Grade 2 IDC with DCIS ER 0%, PR 0%, HER2 negative, Ki-67 0%, axilla negative, additional distortion to be biopsied today   Treatment plan: 07/19/2022: Bilateral mastectomies:  Left mastectomy: Grade 2 IDC 6.8 cm with intermediate grade DCIS, margins negative, ER 100%, PR 5%, HER2 negative (2+ IHC, FISH neg), Ki-67 less than 1% 0/5 lymph nodes;  Right mastectomy: Grade 2 IDC 8.5 cm and 1.5 cm with high-grade DCIS, margins negative, ER 95% (on biopsy it was read as negative), PR 0%, HER2 negative, Ki-67 15% 0/3 lymph nodes Adjuvant chemotherapy CMF x 6 cycles (selected because of her performance status) Postmastectomy radiation Antiestrogen therapy   Patient needs an extremely unhealthy diet rich in cholesterol and fats and sugar.  She does not seem to have any interest in changing her diet.   Chemotherapy Counseling: I discussed the risks and benefits of chemotherapy including the risks of nausea/ vomiting, risk of infection from low WBC count, fatigue due to chemo or anemia, bruising or bleeding due to low platelets, mouth sores, loss/ change  in taste and decreased appetite. Liver and kidney function will be monitored through out chemotherapy as abnormalities in liver and kidney function may be a side effect of treatment. Risk of permanent bone marrow dysfunction and leukemia due to chemo were also discussed.  Return to clinic in 3 weeks to start chemo    Orders Placed This  Encounter  Procedures   Consent Attestation for Oncology Treatment    Order Specific Question:   The patient is informed of risks, benefits, side-effects of the prescribed oncology treatment. Potential short term and long term side effects and response rates discussed. After a long discussion, the patient made informed decision to proceed.    Answer:   Yes   CBC with Differential (Cancer Center Only)    Standing Status:   Future    Standing Expiration Date:   08/21/2023   CMP ([facility] only)    Standing Status:   Future    Standing Expiration Date:   08/21/2023   The patient has a good understanding of the overall plan. she agrees with it. she will call with any problems that may develop before the next visit here. Total time spent: 45 mins including face to face time and time spent for planning, charting and co-ordination of care   Harriette Ohara, MD 08/04/22    I have reviewed the above documentation for accuracy and completeness, and I agree with the above.

## 2022-07-29 NOTE — Progress Notes (Signed)
START OFF PATHWAY REGIMEN - Breast   [id number]:CMF (Cyclophosphamide IV + Methotrexate IV + Fluorouracil IV) q21 Days:   A cycle is every 21 days:     Cyclophosphamide      Fluorouracil      Methotrexate   **Always confirm dose/schedule in your pharmacy ordering system**  Patient Characteristics: Postoperative without Neoadjuvant Therapy (Pathologic Staging), Invasive Disease, Adjuvant Therapy, HER2 Negative, ER Negative, Node Negative, pT1a-c, N4m or pT1c or Higher, pN0 Therapeutic Status: Postoperative without Neoadjuvant Therapy (Pathologic Staging) AJCC Grade: G3 AJCC N Category: pN0 AJCC M Category: cM0 ER Status: Negative (-) AJCC 8 Stage Grouping: IIIA HER2 Status: Negative (-) Oncotype Dx Recurrence Score: Not Appropriate AJCC T Category: pT3 PR Status: Negative (-) Intent of Therapy: Curative Intent, Discussed with Patient

## 2022-07-30 ENCOUNTER — Encounter: Payer: Self-pay | Admitting: *Deleted

## 2022-07-30 LAB — SURGICAL PATHOLOGY

## 2022-08-02 ENCOUNTER — Other Ambulatory Visit: Payer: Self-pay | Admitting: Hematology and Oncology

## 2022-08-02 DIAGNOSIS — Z79891 Long term (current) use of opiate analgesic: Secondary | ICD-10-CM | POA: Diagnosis not present

## 2022-08-02 DIAGNOSIS — Z170 Estrogen receptor positive status [ER+]: Secondary | ICD-10-CM

## 2022-08-02 DIAGNOSIS — G8918 Other acute postprocedural pain: Secondary | ICD-10-CM | POA: Diagnosis not present

## 2022-08-02 DIAGNOSIS — M961 Postlaminectomy syndrome, not elsewhere classified: Secondary | ICD-10-CM | POA: Diagnosis not present

## 2022-08-02 DIAGNOSIS — Z171 Estrogen receptor negative status [ER-]: Secondary | ICD-10-CM

## 2022-08-02 DIAGNOSIS — G894 Chronic pain syndrome: Secondary | ICD-10-CM | POA: Diagnosis not present

## 2022-08-03 ENCOUNTER — Other Ambulatory Visit: Payer: Self-pay

## 2022-08-04 ENCOUNTER — Encounter: Payer: Self-pay | Admitting: *Deleted

## 2022-08-04 ENCOUNTER — Ambulatory Visit: Payer: Self-pay | Admitting: General Surgery

## 2022-08-06 ENCOUNTER — Telehealth: Payer: Self-pay | Admitting: *Deleted

## 2022-08-06 NOTE — Telephone Encounter (Signed)
Received a message that patient had questions regarding her chemo class. Left VM for a return phone call.

## 2022-08-09 ENCOUNTER — Encounter: Payer: Self-pay | Admitting: *Deleted

## 2022-08-10 ENCOUNTER — Encounter (HOSPITAL_COMMUNITY): Payer: Self-pay

## 2022-08-10 NOTE — Patient Instructions (Addendum)
SURGICAL WAITING ROOM VISITATION Patients having surgery or a procedure may have no more than 2 support people in the waiting area - these visitors may rotate.    If the patient needs to stay at the hospital during part of their recovery, the visitor guidelines for inpatient rooms apply. Pre-op nurse will coordinate an appropriate time for 1 support person to accompany patient in pre-op.  This support person may not rotate.    Please refer to the [facility] website for the visitor guidelines for Inpatients (after your surgery is over and you are in a regular room).   Due to an increase in RSV and influenza rates and associated hospitalizations, children ages 6 and under may not visit patients in [place].     Your procedure is scheduled on: 08-12-22   Report to [facility] Main Entrance    Report to admitting at 8:15 AM   Call this number if you have problems the morning of surgery [phone]   Do not eat food :After Midnight.   After Midnight you may have the following liquids until 7:30 AM DAY OF SURGERY  Water Non-Citrus Juices (without pulp, NO RED) Carbonated Beverages Black Coffee (NO MILK/CREAM OR CREAMERS, sugar ok)  Clear Tea (NO MILK/CREAM OR CREAMERS, sugar ok) regular and decaf                             Plain Jell-O (NO RED)                                           Fruit ices (not with fruit pulp, NO RED)                                     Popsicles (NO RED)                                                               Sports drinks like Gatorade (NO RED)               If you have questions, please contact your surgeon's office.   FOLLOW  ANY ADDITIONAL PRE OP INSTRUCTIONS YOU RECEIVED FROM YOUR SURGEON'S OFFICE!!!     Oral Hygiene is also important to reduce your risk of infection.                                    Remember - BRUSH YOUR TEETH THE MORNING OF SURGERY WITH YOUR REGULAR TOOTHPASTE   Do NOT smoke after Midnight   Take  these medicines the morning of surgery with A SIP OF WATER:   Alprazolam  Amlodipine  Nexlizet  Ezetimibe  Claritin  Oxycodone  Pantoprazole  Pregabalin  Okay to use inhalers  How to Manage Your Diabetes Before and After Surgery  Why is it important to control my blood sugar before and after surgery? Improving blood sugar levels before and after surgery helps healing and can limit problems. A way of improving  blood sugar control is eating a healthy diet by:  Eating less sugar and carbohydrates  Increasing activity/exercise  Talking with your doctor about reaching your blood sugar goals High blood sugars (greater than 180 mg/dL) can raise your risk of infections and slow your recovery, so you will need to focus on controlling your diabetes during the weeks before surgery. Make sure that the doctor who takes care of your diabetes knows about your planned surgery including the date and location.  How do I manage my blood sugar before surgery? Check your blood sugar at least 4 times a day, starting 2 days before surgery, to make sure that the level is not too high or low. Check your blood sugar the morning of your surgery when you wake up and every 2 hours until you get to the Short Stay unit. If your blood sugar is less than 70 mg/dL, you will need to treat for low blood sugar: Do not take insulin. Treat a low blood sugar (less than 70 mg/dL) with  cup of clear juice (cranberry or apple), 4 glucose tablets, OR glucose gel. Recheck blood sugar in 15 minutes after treatment (to make sure it is greater than 70 mg/dL). If your blood sugar is not greater than 70 mg/dL on recheck, call [phone] for further instructions. Report your blood sugar to the short stay nurse when you get to Short Stay.  If you are admitted to the hospital after surgery: Your blood sugar will be checked by the staff and you will probably be given insulin after surgery (instead of oral diabetes medicines) to make  sure you have good blood sugar levels. The goal for blood sugar control after surgery is 80-180 mg/dL.   WHAT DO I DO ABOUT MY DIABETES MEDICATION?  Do not take oral diabetes medicines (pills) the morning of surgery.  Reviewed and Endorsed by [facility] Patient Education Committee, August 2015  Bring CPAP mask and tubing day of surgery.                              You may not have any metal on your body including hair pins, jewelry, and body piercing             Do not wear make-up, lotions, powders, perfumes or deodorant  Do not wear nail polish including gel and S&S, artificial/acrylic nails, or any other type of covering on natural nails including finger and toenails. If you have artificial nails, gel coating, etc. that needs to be removed by a nail salon please have this removed prior to surgery or surgery may need to be canceled/ delayed if the surgeon/ anesthesia feels like they are unable to be safely monitored.   Do not shave  48 hours prior to surgery.        Do not bring valuables to the hospital. [place].   Contacts, dentures or bridgework may not be worn into surgery.  DO NOT [place]. PHARMACY WILL DISPENSE MEDICATIONS LISTED ON YOUR MEDICATION LIST TO YOU DURING YOUR ADMISSION [place]!    Patients discharged on the day of surgery will not be allowed to drive home.  Someone NEEDS to stay with you for the first 24 hours after anesthesia.   Special Instructions: Bring a copy of your healthcare power of attorney and living will documents the day of surgery if you haven't  scanned them before.              Please read over the following fact sheets you were given: IF [place]  If you received a COVID test during your pre-op visit  it is requested that you wear a mask when out in public, stay away from anyone that may not be  feeling well and notify your surgeon if you develop symptoms. If you test positive for Covid or have been in contact with anyone that has tested positive in the last 10 days please notify you surgeon.  [place] - Preparing for Surgery Before surgery, you can play an important role.  Because skin is not sterile, your skin needs to be as free of germs as possible.  You can reduce the number of germs on your skin by washing with CHG (chlorahexidine gluconate) soap before surgery.  CHG is an antiseptic cleaner which kills germs and bonds with the skin to continue killing germs even after washing. Please DO NOT use if you have an allergy to CHG or antibacterial soaps.  If your skin becomes reddened/irritated stop using the CHG and inform your nurse when you arrive at Short Stay. Do not shave (including legs and underarms) for at least 48 hours prior to the first CHG shower.  You may shave your face/neck.  Please follow these instructions carefully:  1.  Shower with CHG Soap the night before surgery and the  morning of surgery.  2.  If you choose to wash your hair, wash your hair first as usual with your normal  shampoo.  3.  After you shampoo, rinse your hair and body thoroughly to remove the shampoo.                             4.  Use CHG as you would any other liquid soap.  You can apply chg directly to the skin and wash.  Gently with a scrungie or clean washcloth.  5.  Apply the CHG Soap to your body ONLY FROM THE NECK DOWN.   Do   not use on face/ open                           Wound or open sores. Avoid contact with eyes, ears mouth and   genitals (private parts).                       Wash face,  Genitals (private parts) with your normal soap.             6.  Wash thoroughly, paying special attention to the area where your    surgery  will be performed.  7.  Thoroughly rinse your body with warm water from the neck down.  8.  DO NOT shower/wash with your normal soap after using and rinsing off  the CHG Soap.                9.  Pat yourself dry with a clean towel.            10.  Wear clean pajamas.            11.  Place clean sheets on your bed the night of your first shower and do not  sleep with pets. Day of Surgery : Do not apply any  lotions/deodorants the morning of surgery.  Please wear clean clothes to the hospital/surgery center.  FAILURE TO FOLLOW THESE INSTRUCTIONS MAY RESULT IN THE CANCELLATION OF YOUR SURGERY  PATIENT SIGNATURE_________________________________  NURSE SIGNATURE__________________________________  ________________________________________________________________________

## 2022-08-10 NOTE — Progress Notes (Addendum)
COVID Vaccine Completed:  Date of COVID positive in last 90 days:  PCP - [place] Commons, FNP Cardiologist -   Chest x-ray - 06-18-22 Epic EKG - 06-21-22 Epic Stress Test -  ECHO -  Cardiac Cath -  Pacemaker/ICD device last checked: Spinal Cord Stimulator:  Bowel Prep -   Sleep Study -  CPAP -   Fasting Blood Sugar -  Checks Blood Sugar _____ times a day  [place] Last dose of GLP1 agonist-  N/A GLP1 instructions:  N/A   Last dose of SGLT-2 inhibitors-  N/A SGLT-2 instructions: N/A   Blood Thinner Instructions: Aspirin Instructions: Last Dose:  Activity level:  Can go up a flight of stairs and perform activities of daily living without stopping and without symptoms of chest pain or shortness of breath.  Able to exercise without symptoms  Unable to go up a flight of stairs without symptoms of     Anesthesia review: Murmur, RBBB  Patient denies shortness of breath, fever, cough and chest pain at PAT appointment  Patient verbalized understanding of instructions that were given to them at the PAT appointment. Patient was also instructed that they will need to review over the PAT instructions again at home before surgery.

## 2022-08-11 ENCOUNTER — Encounter (HOSPITAL_COMMUNITY)
Admission: RE | Admit: 2022-08-11 | Discharge: 2022-08-11 | Disposition: A | Discharge status: Home / Self Care | Payer: BC Managed Care – PPO | Source: Ambulatory Visit | Attending: General Surgery | Admitting: General Surgery

## 2022-08-11 ENCOUNTER — Other Ambulatory Visit: Payer: Self-pay

## 2022-08-11 ENCOUNTER — Encounter (HOSPITAL_COMMUNITY): Payer: Self-pay

## 2022-08-11 ENCOUNTER — Encounter (HOSPITAL_COMMUNITY): Payer: Self-pay | Admitting: General Surgery

## 2022-08-11 VITALS — BP 143/101 | HR 103 | Temp 98.6°F | Resp 20 | Ht 66.0 in | Wt 209.0 lb

## 2022-08-11 DIAGNOSIS — K769 Liver disease, unspecified: Secondary | ICD-10-CM

## 2022-08-11 DIAGNOSIS — E119 Type 2 diabetes mellitus without complications: Secondary | ICD-10-CM

## 2022-08-11 DIAGNOSIS — C50811 Malignant neoplasm of overlapping sites of right female breast: Secondary | ICD-10-CM | POA: Diagnosis not present

## 2022-08-11 DIAGNOSIS — Z7984 Long term (current) use of oral hypoglycemic drugs: Secondary | ICD-10-CM | POA: Diagnosis not present

## 2022-08-11 DIAGNOSIS — Z6833 Body mass index (BMI) 33.0-33.9, adult: Secondary | ICD-10-CM | POA: Diagnosis not present

## 2022-08-11 DIAGNOSIS — M1990 Unspecified osteoarthritis, unspecified site: Secondary | ICD-10-CM | POA: Diagnosis not present

## 2022-08-11 DIAGNOSIS — Z01812 Encounter for preprocedural laboratory examination: Secondary | ICD-10-CM | POA: Insufficient documentation

## 2022-08-11 DIAGNOSIS — F17210 Nicotine dependence, cigarettes, uncomplicated: Secondary | ICD-10-CM | POA: Diagnosis not present

## 2022-08-11 DIAGNOSIS — D649 Anemia, unspecified: Secondary | ICD-10-CM | POA: Insufficient documentation

## 2022-08-11 DIAGNOSIS — I10 Essential (primary) hypertension: Secondary | ICD-10-CM | POA: Diagnosis not present

## 2022-08-11 DIAGNOSIS — E669 Obesity, unspecified: Secondary | ICD-10-CM | POA: Diagnosis not present

## 2022-08-11 DIAGNOSIS — K219 Gastro-esophageal reflux disease without esophagitis: Secondary | ICD-10-CM | POA: Diagnosis not present

## 2022-08-11 DIAGNOSIS — Z803 Family history of malignant neoplasm of breast: Secondary | ICD-10-CM | POA: Diagnosis not present

## 2022-08-11 DIAGNOSIS — J45909 Unspecified asthma, uncomplicated: Secondary | ICD-10-CM | POA: Diagnosis not present

## 2022-08-11 DIAGNOSIS — Z171 Estrogen receptor negative status [ER-]: Secondary | ICD-10-CM | POA: Diagnosis not present

## 2022-08-11 DIAGNOSIS — Z87891 Personal history of nicotine dependence: Secondary | ICD-10-CM | POA: Diagnosis not present

## 2022-08-11 DIAGNOSIS — F419 Anxiety disorder, unspecified: Secondary | ICD-10-CM | POA: Diagnosis not present

## 2022-08-11 DIAGNOSIS — C50812 Malignant neoplasm of overlapping sites of left female breast: Secondary | ICD-10-CM | POA: Diagnosis not present

## 2022-08-11 DIAGNOSIS — Z170 Estrogen receptor positive status [ER+]: Secondary | ICD-10-CM | POA: Diagnosis not present

## 2022-08-11 HISTORY — DX: Anemia, unspecified: D64.9

## 2022-08-11 LAB — COMPREHENSIVE METABOLIC PANEL
ALT: 32 U/L (ref 0–44)
AST: 29 U/L (ref 15–41)
Albumin: 3.9 g/dL (ref 3.5–5.0)
Alkaline Phosphatase: 63 U/L (ref 38–126)
Anion gap: 11 (ref 5–15)
BUN: 13 mg/dL (ref 8–23)
CO2: 26 mmol/L (ref 22–32)
Calcium: 9.2 mg/dL (ref 8.9–10.3)
Chloride: 99 mmol/L (ref 98–111)
Creatinine, Ser: 1.08 mg/dL — ABNORMAL HIGH (ref 0.44–1.00)
GFR, Estimated: 57 mL/min — ABNORMAL LOW (ref >60)
Glucose, Bld: 279 mg/dL — ABNORMAL HIGH (ref 70–99)
Potassium: 4 mmol/L (ref 3.5–5.1)
Sodium: 136 mmol/L (ref 135–145)
Total Bilirubin: 0.5 mg/dL (ref 0.3–1.2)
Total Protein: 6.7 g/dL (ref 6.5–8.1)

## 2022-08-11 LAB — CBC
HCT: 44.4 % (ref 36.0–46.0)
Hemoglobin: 13.8 g/dL (ref 12.0–15.0)
MCH: 28.3 pg (ref 26.0–34.0)
MCHC: 31.1 g/dL (ref 30.0–36.0)
MCV: 91 fL (ref 80.0–100.0)
Platelets: 224 10*3/uL (ref 150–400)
RBC: 4.88 MIL/uL (ref 3.87–5.11)
RDW: 14.1 % (ref 11.5–15.5)
WBC: 6 10*3/uL (ref 4.0–10.5)
nRBC: 0 % (ref 0.0–0.2)

## 2022-08-11 LAB — GLUCOSE, CAPILLARY: Glucose-Capillary: 316 mg/dL — ABNORMAL HIGH (ref 70–99)

## 2022-08-12 ENCOUNTER — Ambulatory Visit (HOSPITAL_COMMUNITY): Payer: BC Managed Care – PPO

## 2022-08-12 ENCOUNTER — Ambulatory Visit (HOSPITAL_COMMUNITY): Payer: BC Managed Care – PPO | Admitting: Anesthesiology

## 2022-08-12 ENCOUNTER — Other Ambulatory Visit: Payer: Self-pay

## 2022-08-12 ENCOUNTER — Ambulatory Visit: Payer: BC Managed Care – PPO | Admitting: Pharmacist

## 2022-08-12 ENCOUNTER — Encounter (HOSPITAL_COMMUNITY): Payer: Self-pay | Admitting: General Surgery

## 2022-08-12 ENCOUNTER — Other Ambulatory Visit: Payer: BC Managed Care – PPO

## 2022-08-12 ENCOUNTER — Ambulatory Visit (HOSPITAL_COMMUNITY)
Admission: RE | Admit: 2022-08-12 | Discharge: 2022-08-12 | Disposition: A | Discharge status: Home / Self Care | Payer: BC Managed Care – PPO | Attending: General Surgery | Admitting: General Surgery

## 2022-08-12 ENCOUNTER — Encounter (HOSPITAL_COMMUNITY)
Admission: RE | Disposition: A | Discharge status: Home / Self Care | Payer: Self-pay | Source: Home / Self Care | Attending: General Surgery

## 2022-08-12 DIAGNOSIS — D649 Anemia, unspecified: Secondary | ICD-10-CM | POA: Insufficient documentation

## 2022-08-12 DIAGNOSIS — C50812 Malignant neoplasm of overlapping sites of left female breast: Secondary | ICD-10-CM | POA: Diagnosis not present

## 2022-08-12 DIAGNOSIS — C50811 Malignant neoplasm of overlapping sites of right female breast: Secondary | ICD-10-CM | POA: Diagnosis not present

## 2022-08-12 DIAGNOSIS — Z171 Estrogen receptor negative status [ER-]: Secondary | ICD-10-CM | POA: Insufficient documentation

## 2022-08-12 DIAGNOSIS — Z87891 Personal history of nicotine dependence: Secondary | ICD-10-CM | POA: Diagnosis not present

## 2022-08-12 DIAGNOSIS — M1990 Unspecified osteoarthritis, unspecified site: Secondary | ICD-10-CM | POA: Insufficient documentation

## 2022-08-12 DIAGNOSIS — J45909 Unspecified asthma, uncomplicated: Secondary | ICD-10-CM | POA: Insufficient documentation

## 2022-08-12 DIAGNOSIS — F419 Anxiety disorder, unspecified: Secondary | ICD-10-CM | POA: Insufficient documentation

## 2022-08-12 DIAGNOSIS — C50911 Malignant neoplasm of unspecified site of right female breast: Secondary | ICD-10-CM | POA: Diagnosis not present

## 2022-08-12 DIAGNOSIS — Z803 Family history of malignant neoplasm of breast: Secondary | ICD-10-CM | POA: Diagnosis not present

## 2022-08-12 DIAGNOSIS — Z6833 Body mass index (BMI) 33.0-33.9, adult: Secondary | ICD-10-CM | POA: Diagnosis not present

## 2022-08-12 DIAGNOSIS — E669 Obesity, unspecified: Secondary | ICD-10-CM | POA: Diagnosis not present

## 2022-08-12 DIAGNOSIS — Z7984 Long term (current) use of oral hypoglycemic drugs: Secondary | ICD-10-CM | POA: Diagnosis not present

## 2022-08-12 DIAGNOSIS — Z170 Estrogen receptor positive status [ER+]: Secondary | ICD-10-CM | POA: Insufficient documentation

## 2022-08-12 DIAGNOSIS — K219 Gastro-esophageal reflux disease without esophagitis: Secondary | ICD-10-CM | POA: Insufficient documentation

## 2022-08-12 DIAGNOSIS — K769 Liver disease, unspecified: Secondary | ICD-10-CM | POA: Insufficient documentation

## 2022-08-12 DIAGNOSIS — I10 Essential (primary) hypertension: Secondary | ICD-10-CM | POA: Diagnosis not present

## 2022-08-12 DIAGNOSIS — E119 Type 2 diabetes mellitus without complications: Secondary | ICD-10-CM | POA: Insufficient documentation

## 2022-08-12 DIAGNOSIS — F17210 Nicotine dependence, cigarettes, uncomplicated: Secondary | ICD-10-CM | POA: Insufficient documentation

## 2022-08-12 DIAGNOSIS — I700 Atherosclerosis of aorta: Secondary | ICD-10-CM | POA: Diagnosis not present

## 2022-08-12 DIAGNOSIS — C50912 Malignant neoplasm of unspecified site of left female breast: Secondary | ICD-10-CM | POA: Diagnosis not present

## 2022-08-12 HISTORY — PX: PORTACATH PLACEMENT: SHX2246

## 2022-08-12 LAB — GLUCOSE, CAPILLARY
Glucose-Capillary: 333 mg/dL — ABNORMAL HIGH (ref 70–99)
Glucose-Capillary: 281 mg/dL — ABNORMAL HIGH (ref 70–99)
Glucose-Capillary: 266 mg/dL — ABNORMAL HIGH (ref 70–99)
Glucose-Capillary: 260 mg/dL — ABNORMAL HIGH (ref 70–99)
Glucose-Capillary: 248 mg/dL — ABNORMAL HIGH (ref 70–99)

## 2022-08-12 SURGERY — INSERTION, TUNNELED CENTRAL VENOUS DEVICE, WITH PORT
Anesthesia: General | Laterality: Right

## 2022-08-12 MED ORDER — HEPARIN 6000 UNIT IRRIGATION SOLUTION
Freq: Once | Status: AC
  Administered 2022-08-12: 1
  Filled 2022-08-12: qty 6000

## 2022-08-12 MED ORDER — ONDANSETRON HCL 4 MG/2ML IJ SOLN
INTRAMUSCULAR | Status: DC | PRN
  Administered 2022-08-12: 4 mg via INTRAVENOUS

## 2022-08-12 MED ORDER — ACETAMINOPHEN 500 MG PO TABS
1000.0000 mg | ORAL_TABLET | ORAL | Status: AC
  Administered 2022-08-12: 1000 mg via ORAL
  Filled 2022-08-12: qty 2

## 2022-08-12 MED ORDER — FENTANYL CITRATE PF 50 MCG/ML IJ SOSY: 25.0000 ug | PREFILLED_SYRINGE | INTRAMUSCULAR | Status: DC | PRN

## 2022-08-12 MED ORDER — HEPARIN SOD (PORK) LOCK FLUSH 100 UNIT/ML IV SOLN
INTRAVENOUS | Status: AC
  Filled 2022-08-12: qty 5

## 2022-08-12 MED ORDER — CEFAZOLIN SODIUM-DEXTROSE 2-4 GM/100ML-% IV SOLN
2.0000 g | INTRAVENOUS | Status: AC
  Administered 2022-08-12: 2 g via INTRAVENOUS
  Filled 2022-08-12: qty 100

## 2022-08-12 MED ORDER — INSULIN ASPART 100 UNIT/ML IJ SOLN
3.0000 [IU] | Freq: Once | INTRAMUSCULAR | Status: AC
  Administered 2022-08-12: 3 [IU] via SUBCUTANEOUS

## 2022-08-12 MED ORDER — HEPARIN SOD (PORK) LOCK FLUSH 100 UNIT/ML IV SOLN
INTRAVENOUS | Status: DC | PRN
  Administered 2022-08-12: 500 [IU]

## 2022-08-12 MED ORDER — LACTATED RINGERS IV SOLN: INTRAVENOUS | Status: DC

## 2022-08-12 MED ORDER — AMISULPRIDE (ANTIEMETIC) 5 MG/2ML IV SOLN: 10.0000 mg | Freq: Once | INTRAVENOUS | Status: DC | PRN

## 2022-08-12 MED ORDER — FENTANYL CITRATE (PF) 100 MCG/2ML IJ SOLN
INTRAMUSCULAR | Status: DC | PRN
  Administered 2022-08-12 (×2): 50 ug via INTRAVENOUS

## 2022-08-12 MED ORDER — GABAPENTIN 100 MG PO CAPS
100.0000 mg | ORAL_CAPSULE | ORAL | Status: AC
  Administered 2022-08-12: 100 mg via ORAL
  Filled 2022-08-12: qty 1

## 2022-08-12 MED ORDER — IPRATROPIUM-ALBUTEROL 0.5-2.5 (3) MG/3ML IN SOLN
RESPIRATORY_TRACT | Status: AC
  Filled 2022-08-12: qty 3

## 2022-08-12 MED ORDER — TRAMADOL HCL 50 MG PO TABS: 50.0000 mg | ORAL_TABLET | Freq: Four times a day (QID) | ORAL | 0 refills | Status: DC | PRN

## 2022-08-12 MED ORDER — CHLORHEXIDINE GLUCONATE CLOTH 2 % EX PADS: 6.0000 | MEDICATED_PAD | Freq: Once | CUTANEOUS | Status: DC

## 2022-08-12 MED ORDER — PROPOFOL 10 MG/ML IV BOLUS
INTRAVENOUS | Status: DC | PRN
  Administered 2022-08-12: 150 mg via INTRAVENOUS
  Administered 2022-08-12: 50 mg via INTRAVENOUS

## 2022-08-12 MED ORDER — SUGAMMADEX SODIUM 200 MG/2ML IV SOLN
INTRAVENOUS | Status: DC | PRN
  Administered 2022-08-12: 200 mg via INTRAVENOUS

## 2022-08-12 MED ORDER — INSULIN ASPART 100 UNIT/ML IJ SOLN
5.0000 [IU] | Freq: Once | INTRAMUSCULAR | Status: AC
  Administered 2022-08-12: 5 [IU] via SUBCUTANEOUS

## 2022-08-12 MED ORDER — IPRATROPIUM-ALBUTEROL 0.5-2.5 (3) MG/3ML IN SOLN
3.0000 mL | RESPIRATORY_TRACT | Status: DC
  Administered 2022-08-12: 3 mL via RESPIRATORY_TRACT

## 2022-08-12 MED ORDER — INSULIN ASPART 100 UNIT/ML IJ SOLN
INTRAMUSCULAR | Status: AC
  Filled 2022-08-12: qty 1

## 2022-08-12 MED ORDER — ONDANSETRON HCL 4 MG/2ML IJ SOLN: 4.0000 mg | Freq: Once | INTRAMUSCULAR | Status: DC | PRN

## 2022-08-12 MED ORDER — SODIUM CHLORIDE 0.9 % IR SOLN
Status: DC | PRN
  Administered 2022-08-12: 1000 mL

## 2022-08-12 MED ORDER — PHENYLEPHRINE 80 MCG/ML (10ML) SYRINGE FOR IV PUSH (FOR BLOOD PRESSURE SUPPORT)
PREFILLED_SYRINGE | INTRAVENOUS | Status: DC | PRN
  Administered 2022-08-12 (×2): 120 ug via INTRAVENOUS

## 2022-08-12 MED ORDER — ORAL CARE MOUTH RINSE
15.0000 mL | Freq: Once | OROMUCOSAL | Status: AC
  Administered 2022-08-12: 15 mL via OROMUCOSAL

## 2022-08-12 MED ORDER — PHENYLEPHRINE HCL-NACL 20-0.9 MG/250ML-% IV SOLN
INTRAVENOUS | Status: DC | PRN
  Administered 2022-08-12: 45 ug/min via INTRAVENOUS

## 2022-08-12 MED ORDER — DEXAMETHASONE SODIUM PHOSPHATE 10 MG/ML IJ SOLN
INTRAMUSCULAR | Status: DC | PRN
  Administered 2022-08-12: 4 mg via INTRAVENOUS

## 2022-08-12 MED ORDER — PROPOFOL 10 MG/ML IV BOLUS
INTRAVENOUS | Status: AC
  Filled 2022-08-12: qty 20

## 2022-08-12 MED ORDER — KETOROLAC TROMETHAMINE 15 MG/ML IJ SOLN: 15.0000 mg | Freq: Once | INTRAMUSCULAR | Status: DC | PRN

## 2022-08-12 MED ORDER — FENTANYL CITRATE (PF) 100 MCG/2ML IJ SOLN
INTRAMUSCULAR | Status: AC
  Filled 2022-08-12: qty 2

## 2022-08-12 MED ORDER — ROCURONIUM BROMIDE 10 MG/ML (PF) SYRINGE
PREFILLED_SYRINGE | INTRAVENOUS | Status: DC | PRN
  Administered 2022-08-12: 20 mg via INTRAVENOUS
  Administered 2022-08-12: 30 mg via INTRAVENOUS

## 2022-08-12 MED ORDER — BUPIVACAINE HCL (PF) 0.25 % IJ SOLN
INTRAMUSCULAR | Status: AC
  Filled 2022-08-12: qty 30

## 2022-08-12 MED ORDER — BUPIVACAINE HCL (PF) 0.25 % IJ SOLN
INTRAMUSCULAR | Status: DC | PRN
  Administered 2022-08-12: 5 mL

## 2022-08-12 MED ORDER — PHENYLEPHRINE 80 MCG/ML (10ML) SYRINGE FOR IV PUSH (FOR BLOOD PRESSURE SUPPORT)
PREFILLED_SYRINGE | INTRAVENOUS | Status: AC
  Filled 2022-08-12: qty 10

## 2022-08-12 MED ORDER — LIDOCAINE HCL (PF) 2 % IJ SOLN
INTRAMUSCULAR | Status: AC
  Filled 2022-08-12: qty 10

## 2022-08-12 MED ORDER — LIDOCAINE 2% (20 MG/ML) 5 ML SYRINGE
INTRAMUSCULAR | Status: DC | PRN
  Administered 2022-08-12: 60 mg via INTRAVENOUS

## 2022-08-12 MED ORDER — CHLORHEXIDINE GLUCONATE 0.12 % MT SOLN: 15.0000 mL | Freq: Once | OROMUCOSAL | Status: AC

## 2022-08-12 SURGICAL SUPPLY — 42 items
ADH SKN CLS APL DERMABOND .7 (GAUZE/BANDAGES/DRESSINGS) ×1
APL SKNCLS STERI-STRIP NONHPOA (GAUZE/BANDAGES/DRESSINGS)
BAG COUNTER SPONGE SURGICOUNT (BAG) IMPLANT
BAG DECANTER FOR FLEXI CONT (MISCELLANEOUS) ×2 IMPLANT
BAG SPNG CNTER NS LX DISP (BAG)
BENZOIN TINCTURE PRP APPL 2/3 (GAUZE/BANDAGES/DRESSINGS) IMPLANT
BLADE HEX COATED 2.75 (ELECTRODE) ×2 IMPLANT
BLADE SURG 15 STRL LF DISP TIS (BLADE) ×2 IMPLANT
BLADE SURG 15 STRL SS (BLADE) ×1
DERMABOND ADVANCED .7 DNX12 (GAUZE/BANDAGES/DRESSINGS) IMPLANT
DRAPE C-ARM 42X120 X-RAY (DRAPES) ×2 IMPLANT
DRAPE LAPAROSCOPIC ABDOMINAL (DRAPES) ×2 IMPLANT
DRSG TEGADERM 2-3/8X2-3/4 SM (GAUZE/BANDAGES/DRESSINGS) IMPLANT
DRSG TEGADERM 4X4.75 (GAUZE/BANDAGES/DRESSINGS) IMPLANT
ELECT REM PT RETURN 15FT ADLT (MISCELLANEOUS) ×2 IMPLANT
GAUZE 4X4 16PLY ~~LOC~~+RFID DBL (SPONGE) ×2 IMPLANT
GAUZE SPONGE 4X4 12PLY STRL (GAUZE/BANDAGES/DRESSINGS) IMPLANT
GLOVE BIO SURGEON STRL SZ7.5 (GLOVE) ×4 IMPLANT
GLOVE BIOGEL PI IND STRL 7.0 (GLOVE) ×2 IMPLANT
GOWN STRL REUS W/ TWL LRG LVL3 (GOWN DISPOSABLE) ×2 IMPLANT
GOWN STRL REUS W/ TWL XL LVL3 (GOWN DISPOSABLE) ×2 IMPLANT
GOWN STRL REUS W/TWL LRG LVL3 (GOWN DISPOSABLE) ×1
GOWN STRL REUS W/TWL XL LVL3 (GOWN DISPOSABLE) ×1
KIT BASIN OR (CUSTOM PROCEDURE TRAY) ×2 IMPLANT
KIT PORT POWER 8FR ISP CVUE (Port) IMPLANT
KIT TURNOVER KIT A (KITS) IMPLANT
NDL HYPO 25X1 1.5 SAFETY (NEEDLE) ×2 IMPLANT
NEEDLE HYPO 25X1 1.5 SAFETY (NEEDLE) ×1 IMPLANT
NS IRRIG 1000ML POUR BTL (IV SOLUTION) ×2 IMPLANT
PACK BASIC VI WITH GOWN DISP (CUSTOM PROCEDURE TRAY) ×2 IMPLANT
PENCIL SMOKE EVACUATOR (MISCELLANEOUS) IMPLANT
SPIKE FLUID TRANSFER (MISCELLANEOUS) ×2 IMPLANT
STRIP CLOSURE SKIN 1/2X4 (GAUZE/BANDAGES/DRESSINGS) IMPLANT
SUT MNCRL AB 4-0 PS2 18 (SUTURE) ×2 IMPLANT
SUT PROLENE 2 0 SH DA (SUTURE) ×2 IMPLANT
SUT SILK 2 0 (SUTURE)
SUT SILK 2-0 30XBRD TIE 12 (SUTURE) IMPLANT
SUT VIC AB 3-0 SH 27 (SUTURE) ×1
SUT VIC AB 3-0 SH 27XBRD (SUTURE) ×2 IMPLANT
SYR 10ML LL (SYRINGE) ×2 IMPLANT
SYR CONTROL 10ML LL (SYRINGE) ×2 IMPLANT
TOWEL OR 17X26 10 PK STRL BLUE (TOWEL DISPOSABLE) ×2 IMPLANT

## 2022-08-12 NOTE — H&P (Signed)
PROVIDER: Landry Corporal, MD  MRN: [id number] DOB: 08/29/1960 Subjective   Chief Complaint: Follow-up (1ST POST OP PT-MC-OPB-BIL MASTIES W/ BIL SLNB)   History of Present Illness: [name] is a 62 y.o. female who is seen today for bilateral breast cancer. The patient is a 62 year old white female who is about 2 weeks status post left mastectomy and sentinel node biopsy for a T3 N0 breast cancer that was ER positive and PR negative and HER2 negative with a Ki-67 of 1%. She also had a right mastectomy and sentinel node biopsy for a T3 N0 triple negative breast cancer with a Ki-67 of 0%. She tolerated the surgery well. Her drains are putting out about 50 to 60 cc a day of clear yellow fluid. She denies any chest wall pain.    Review of Systems: A complete review of systems was obtained from the patient. I have reviewed this information and discussed as appropriate with the patient. See HPI as well for other ROS.  ROS   Medical History: Past Medical History:  Diagnosis Date  Arthritis  GERD (gastroesophageal reflux disease)  History of cancer  Hypertension   Patient Active Problem List  Diagnosis  Essential hypertension  Malignant neoplasm of overlapping sites of both breasts in female, estrogen receptor negative  Malignant neoplasm of upper-outer quadrant of right breast in female, estrogen receptor negative   Past Surgical History:  Procedure Laterality Date  Back Surgery N/A  1989, 1990, 2006  CHOLECYSTECTOMY N/A  1998  HYSTERECTOMY N/A  Partial hysterectomy  MASTECTOMY    Allergies  Allergen Reactions  Codeine Phosphate Unknown  REACTION: unspecified  Simvastatin Other (See Comments)  REACTION: muscle' \\T'$ \ joint pains  Sulfamethoxazole Unknown  REACTION: unspecified   Current Outpatient Medications on File Prior to Visit  Medication Sig Dispense Refill  albuterol 90 mcg/actuation inhaler Inhale 2 inhalations into the lungs every 6 (six) hours as needed for  Shortness of Breath or Wheezing  amLODIPine (NORVASC) 10 MG tablet Take 10 mg by mouth once daily  dexlansoprazole (DEXILANT) 60 mg DR capsule Take 60 mg by mouth once daily  loratadine (CLARITIN) 10 mg tablet Take 10 mg by mouth once daily  OZEMPIC 1 mg/dose (4 mg/3 mL) pen injector Inject 1 mg subcutaneously once a week   No current facility-administered medications on file prior to visit.   Family History  Problem Relation Age of Onset  High blood pressure (Hypertension) Mother  Diabetes Mother  Myocardial Infarction (Heart attack) Mother  High blood pressure (Hypertension) Father  Myocardial Infarction (Heart attack) Father  Diabetes Father  Breast cancer Sister    Social History   Tobacco Use  Smoking Status Every Day  Packs/day: 1  Types: Cigarettes  Smokeless Tobacco Never    Social History   Socioeconomic History  Marital status: Married  Tobacco Use  Smoking status: Every Day  Packs/day: 1  Types: Cigarettes  Smokeless tobacco: Never  Vaping Use  Vaping Use: Never used  Substance and Sexual Activity  Alcohol use: Not Currently  Drug use: Never   Objective:   Vitals:  BP: 125/80  Pulse: 60  Temp: 36.9 C (98.4 F)  SpO2: 96%  Weight: 96.2 kg (212 lb)  Height: 165.1 cm ('5\' 5"'$ )   Body mass index is 35.28 kg/m.  Physical Exam Vitals reviewed.  Constitutional:  General: She is not in acute distress. Appearance: Normal appearance.  HENT:  Head: Normocephalic and atraumatic.  Right Ear: External ear normal.  Left Ear: External ear  normal.  Nose: Nose normal.  Mouth/Throat:  Mouth: Mucous membranes are moist.  Pharynx: Oropharynx is clear.  Eyes:  General: No scleral icterus. Extraocular Movements: Extraocular movements intact.  Conjunctiva/sclera: Conjunctivae normal.  Pupils: Pupils are equal, round, and reactive to light.  Cardiovascular:  Rate and Rhythm: Normal rate and regular rhythm.  Pulses: Normal pulses.  Heart sounds: Normal  heart sounds.  Pulmonary:  Effort: Pulmonary effort is normal. No respiratory distress.  Breath sounds: Normal breath sounds.  Abdominal:  General: Bowel sounds are normal.  Palpations: Abdomen is soft.  Tenderness: There is no abdominal tenderness.  Musculoskeletal:  General: No swelling, tenderness or deformity. Normal range of motion.  Cervical back: Normal range of motion and neck supple.  Skin: General: Skin is warm and dry.  Coloration: Skin is not jaundiced.  Neurological:  General: No focal deficit present.  Mental Status: She is alert and oriented to person, place, and time.  Psychiatric:  Mood and Affect: Mood normal.  Behavior: Behavior normal.     Breast: Both mastectomy incisions are healing nicely with no sign of infection or seroma. The skin flaps are all healthy.  Labs, Imaging and Diagnostic Testing:  Assessment and Plan:   Diagnoses and all orders for this visit:  Malignant neoplasm of overlapping sites of both breasts in female, estrogen receptor negative     The patient is about 2 weeks status post bilateral mastectomies for bilateral breast cancer. At this point she will continue to empty the drains and record the output. I will plan to see her back in 1 week to see if I can remove the drains. She will also need a Port-A-Cath for chemotherapy. I have discussed with her in detail the risks and benefits of the operation to place the port as well as some of the technical aspects including the risk of pneumothorax and she understands and wishes to proceed.

## 2022-08-12 NOTE — Anesthesia Preprocedure Evaluation (Addendum)
Anesthesia Evaluation  Patient identified by MRN, date of birth, ID band Patient awake    Reviewed: Allergy & Precautions, NPO status , Patient's Chart, lab work & pertinent test results  Airway Mallampati: III  TM Distance: >3 FB Neck ROM: Full    Dental  (+) Upper Dentures, Lower Dentures   Pulmonary asthma , former smoker   Pulmonary exam normal        Cardiovascular hypertension, Pt. on medications Normal cardiovascular exam     Neuro/Psych   Anxiety     negative neurological ROS     GI/Hepatic ,GERD  Medicated and Controlled,,(+)     substance abuse    Endo/Other  diabetes, Oral Hypoglycemic Agents  Glucose: 281   Renal/GU Renal disease     Musculoskeletal  (+) Arthritis ,  narcotic dependent  Abdominal  (+) + obese  Peds  Hematology negative hematology ROS (+)   Anesthesia Other Findings BILATERAL BREAST CANCER  Reproductive/Obstetrics                             Anesthesia Physical Anesthesia Plan  ASA: 3  Anesthesia Plan: General   Post-op Pain Management:    Induction: Intravenous  PONV Risk Score and Plan: 3 and Ondansetron, Dexamethasone, Midazolam and Treatment may vary due to age or medical condition  Airway Management Planned: LMA  Additional Equipment:   Intra-op Plan:   Post-operative Plan: Extubation in OR  Informed Consent: I have reviewed the patients History and Physical, chart, labs and discussed the procedure including the risks, benefits and alternatives for the proposed anesthesia with the patient or authorized representative who has indicated his/her understanding and acceptance.       Plan Discussed with: CRNA  Anesthesia Plan Comments:        Anesthesia Quick Evaluation

## 2022-08-12 NOTE — Interval H&P Note (Signed)
History and Physical Interval Note:  08/12/2022 9:15 AM  [name]  has presented today for surgery, with the diagnosis of BILATERAL BREAST CANCER.  The various methods of treatment have been discussed with the patient and family. After consideration of risks, benefits and other options for treatment, the patient has consented to  Procedure(s): INSERTION PORT-A-CATH (Right) as a surgical intervention.  The patient's history has been reviewed, patient examined, no change in status, stable for surgery.  I have reviewed the patient's chart and labs.  Questions were answered to the patient's satisfaction.     Autumn Messing III

## 2022-08-12 NOTE — Transfer of Care (Signed)
Immediate Anesthesia Transfer of Care Note  Patient: [name]  Procedure(s) Performed: Procedure(s): INSERTION PORT-A-CATH (Right)  Patient Location: PACU  Anesthesia Type:General  Level of Consciousness:  sedated, patient cooperative and responds to stimulation  Airway & Oxygen Therapy:Patient Spontanous Breathing and Patient connected to face mask oxgen  Post-op Assessment:  Report given to PACU RN and Post -op Vital signs reviewed and stable  Post vital signs:  Reviewed and stable  Last Vitals:  Vitals:   08/12/22 0805 08/12/22 1111  BP: 95/67 99/69  Pulse: 96 89  Resp: 18 13  Temp: 37 C   SpO2: XX123456 [id number]    Complications: No apparent anesthesia complications

## 2022-08-12 NOTE — Op Note (Signed)
08/12/2022  11:05 AM  PATIENT:  [name]  62 y.o. female  PRE-OPERATIVE DIAGNOSIS:  BILATERAL BREAST CANCER  POST-OPERATIVE DIAGNOSIS:  BILATERAL BREAST CANCER  PROCEDURE:  Procedure(s): INSERTION PORT-A-CATH (Right)  SURGEON:  Surgeon(s) and Role:    * Jovita Kussmaul, MD - Primary  PHYSICIAN ASSISTANT:   ASSISTANTS: none   ANESTHESIA:   local and general  EBL:  minimal   BLOOD ADMINISTERED:none  DRAINS: none   LOCAL MEDICATIONS USED:  MARCAINE     SPECIMEN:  No Specimen  DISPOSITION OF SPECIMEN:  N/A  COUNTS:  YES  TOURNIQUET:  * No tourniquets in log *  DICTATION: .Dragon Dictation  After informed consent was obtained the patient was brought to the operating room and placed in the supine position on the operating table.  After adequate induction of general anesthesia a roll was placed between the patient's shoulder blades to extend the shoulder slightly.  The right chest and neck area were then prepped with ChloraPrep, allowed to dry, and draped in usual sterile manner.  An appropriate timeout was performed.  The patient was placed in Trendelenburg position.  The area lateral to the bend of the clavicle was infiltrated with quarter percent Marcaine.  Using the ultrasound I was able to identify the right internal jugular vein.  Under direct vision with the ultrasound I was able to access the vein with a large bore needle from the Port-A-Cath kit.  The wire was then fed through the needle using the Seldinger technique without difficulty.  The wire was confirmed in the central venous system using real-time fluoroscopy.  Next a small incision was made at the wire entry site on the right neck.  A slightly larger incision was then made on the right chest wall.  The right chest wall incision was carried through the skin and subcutaneous tissue sharply with the electrocautery.  A subcutaneous pocket was then created by blunt finger dissection inferior to the incision.  Next  the 2 incisions were connected through a subcutaneous tunnel by blunt hemostat dissection.  A tendon passer was placed across the subcutaneous tunnel.  The port tubing was then brought through the tunnel.  The tubing was placed on the reservoir.  The reservoir was placed in the pocket and the length of the tubing was estimated using real-time fluoroscopy.  The tubing was cut to the appropriate length.  Next a sheath and dilator were fed over the wire using the Seldinger technique.  The wire and dilator were removed.  The tubing was fed through the sheath as far as it would go and then held in place while the sheath was gently cracked and separated.  Another real-time fluoroscopy image showed the tip of the catheter to be in the distal superior vena cava.  The tubing was then permanently anchored to the reservoir.  The reservoir was anchored in the pocket with two 2-0 Prolene stitches.  The port was then aspirated and it aspirated blood easily.  The port was then flushed initially with a dilute heparin solution and then with a more concentrated heparin solution.  The subcutaneous tissue was closed over the port with interrupted 3-0 Vicryl stitches.  The skin incision on the neck was closed with an interrupted 4-0 Monocryl subcuticular stitch.  The skin incision on the right chest was closed with a running 4-0 Monocryl subcuticular stitch.  Dermabond dressings were applied.  The patient tolerated the procedure well.  At the end of the case all needle  sponge and instrument counts were correct.  The patient was then awakened and taken to recovery in stable condition.  PLAN OF CARE: Discharge to home after PACU  PATIENT DISPOSITION:  PACU - hemodynamically stable.   Delay start of Pharmacological VTE agent (>24hrs) due to surgical blood loss or risk of bleeding: not applicable

## 2022-08-12 NOTE — Anesthesia Procedure Notes (Signed)
Procedure Name: Intubation Date/Time: 08/12/2022 10:20 AM  Performed by: Lavina Hamman, CRNAPre-anesthesia Checklist: Patient identified, Emergency Drugs available, Suction available, Patient being monitored and Timeout performed Patient Re-evaluated:Patient Re-evaluated prior to induction Oxygen Delivery Method: Circle system utilized Preoxygenation: Pre-oxygenation with 100% oxygen Induction Type: IV induction Ventilation: Mask ventilation without difficulty and Oral airway inserted - appropriate to patient size Laryngoscope Size: Mac and 3 Grade View: Grade I Tube type: Oral Tube size: 7.0 mm Number of attempts: 1 Airway Equipment and Method: Stylet Placement Confirmation: ETT inserted through vocal cords under direct vision, positive ETCO2, CO2 detector and breath sounds checked- equal and bilateral Secured at: 21 cm Tube secured with: Tape Dental Injury: Teeth and Oropharynx as per pre-operative assessment  Comments: ATOI

## 2022-08-13 ENCOUNTER — Other Ambulatory Visit: Payer: Self-pay | Admitting: Hematology and Oncology

## 2022-08-13 ENCOUNTER — Encounter (HOSPITAL_COMMUNITY): Payer: Self-pay | Admitting: General Surgery

## 2022-08-13 DIAGNOSIS — C50411 Malignant neoplasm of upper-outer quadrant of right female breast: Secondary | ICD-10-CM

## 2022-08-13 DIAGNOSIS — Z170 Estrogen receptor positive status [ER+]: Secondary | ICD-10-CM

## 2022-08-13 NOTE — Anesthesia Postprocedure Evaluation (Signed)
Anesthesia Post Note  Patient: [name]  Procedure(s) Performed: INSERTION PORT-A-CATH (Right)     Patient location during evaluation: PACU Anesthesia Type: General Level of consciousness: sedated and patient cooperative Pain management: pain level controlled Vital Signs Assessment: post-procedure vital signs reviewed and stable Respiratory status: spontaneous breathing Cardiovascular status: stable Anesthetic complications: no   No notable events documented.  Last Vitals:  Vitals:   08/12/22 1345 08/12/22 1400  BP: (!) 126/100   Pulse: 82   Resp: 16   Temp:    SpO2: 92% (S) 91%    Last Pain:  Vitals:   08/12/22 1400  TempSrc:   PainSc: 0-No pain                 Nolon Nations

## 2022-08-16 NOTE — Progress Notes (Signed)
Pharmacist Chemotherapy Monitoring - Initial Assessment    Anticipated start date: 08/20/22      The following has been reviewed per standard work regarding the patient's treatment regimen: The patient's diagnosis, treatment plan and drug doses, and organ/hematologic function Lab orders and baseline tests specific to treatment regimen  The treatment plan start date, drug sequencing, and pre-medications Prior authorization status  Patient's documented medication list, including drug-drug interaction screen and prescriptions for anti-emetics and supportive care specific to the treatment regimen The drug concentrations, fluid compatibility, administration routes, and timing of the medications to be used The patient's access for treatment and lifetime cumulative dose history, if applicable  The patient's medication allergies and previous infusion related reactions, if applicable   Changes made to treatment plan:  N/A  Follow up needed:  N/A   Acquanetta Belling, RPH, BCPS, BCOP 08/16/2022  3:34 PM

## 2022-08-17 ENCOUNTER — Inpatient Hospital Stay: Payer: BC Managed Care – PPO | Attending: Hematology and Oncology

## 2022-08-17 ENCOUNTER — Other Ambulatory Visit: Payer: Self-pay

## 2022-08-17 ENCOUNTER — Inpatient Hospital Stay: Payer: BC Managed Care – PPO | Admitting: Pharmacist

## 2022-08-17 DIAGNOSIS — Z79899 Other long term (current) drug therapy: Secondary | ICD-10-CM | POA: Insufficient documentation

## 2022-08-17 DIAGNOSIS — Z9013 Acquired absence of bilateral breasts and nipples: Secondary | ICD-10-CM | POA: Insufficient documentation

## 2022-08-17 DIAGNOSIS — Z5111 Encounter for antineoplastic chemotherapy: Secondary | ICD-10-CM | POA: Insufficient documentation

## 2022-08-17 DIAGNOSIS — Z170 Estrogen receptor positive status [ER+]: Secondary | ICD-10-CM

## 2022-08-17 DIAGNOSIS — Z171 Estrogen receptor negative status [ER-]: Secondary | ICD-10-CM | POA: Insufficient documentation

## 2022-08-17 DIAGNOSIS — C50812 Malignant neoplasm of overlapping sites of left female breast: Secondary | ICD-10-CM | POA: Insufficient documentation

## 2022-08-17 DIAGNOSIS — C50411 Malignant neoplasm of upper-outer quadrant of right female breast: Secondary | ICD-10-CM | POA: Insufficient documentation

## 2022-08-17 NOTE — Progress Notes (Signed)
[place]       Telephone: [phone]?Fax: [phone]   Oncology Clinical Pharmacist Practitioner Initial Assessment  [name] is a 62 y.o. female with a diagnosis of breast cancer. They were contacted today via in-person visit. We contacted [name] using two patient identifiers because [name] delegated her aunt, Jessica Murdoch, do preside in-person today to review the chemotherapy education. [name] was available to review her medication list but preferred that we speak to her aunt regarding the chemotherapy regimen. [name] confirmed verbally that this was okay to do and [name], chemotherapy education nurse, was a witness to this conversation.   We did review with [name] that there is a potential interaction with pantoprazole and methotrexate where methotrexate serum concentrations could be increased. She verbalized understanding and accepted the potential risk. We reviewed the 24/7 number to call, [phone] should she have any questions or concerns while receiving treatment. She verbalized understanding of the plan and authorized clinical pharmacy to speak to her aunt [name].  Indication/Regimen CMF: cyclophosphamide, methotrexate, fluorouracil are being used appropriately for treatment of breast cancer by Dr. Nicholas Lose.      Wt Readings from Last 1 Encounters:  08/12/22 209 lb (94.8 kg)    Estimated body surface area is 2.1 meters squared as calculated from the following:   Height as of 08/12/22: '5\' 6"'$  (1.676 m).   Weight as of 08/12/22: 209 lb (94.8 kg).  The dosing regimen is every 21 days for 6 cycles  Cyclophosphamide (600 mg/m2) on Day 1 Methotrexate (40 mg/m2) on Day 1 Fluorouracil (600 mg/m2) on Day 1  Dose Modifications  [name] removed dexamethasone from her prescriptions.   Allergies Allergies  Allergen Reactions   Codeine Phosphate Other (See Comments)    Gi upset   Lipitor [Atorvastatin]     Joint  pain   Simvastatin     REACTION: muscle' \\T'$ \ joint pains   Sulfamethoxazole Hives   Tape Rash    Adhesive. Pt states "it takes skin off"  Not even paper tape   Contraindications Contraindications were reviewed? Yes Contraindications to therapy were identified? No   Safety Precautions The following safety precautions for the use of CMF were reviewed:  Fever: reviewed the importance of having a thermometer and the Centers for Disease Control and Prevention (CDC) definition of fever which is 100.69F (38C) or higher. Patient should call 24/7 triage at [phone] if experiencing a fever or any other symptoms Decreased hemoglobin Decreased platelets Decreased white blood cells Fatigue Diarrhea Mucositis Nausea Vomiting Alopecia Skin reactions Kidney injury Liver injury Cardiotoxicity Secondary malignancies Drug-Drug interactions Conditions such as pleural effusions, ascites VTE Pneumonitis Importance of drinking plenty of fluids Handling body fluids and waste Intimacy, sexual activity, contraception, and fertility if applicable  Medication Reconciliation Current Outpatient Medications  Medication Sig Dispense Refill   albuterol (VENTOLIN HFA) 108 (90 Base) MCG/ACT inhaler Inhale 2 puffs into the lungs every 6 (six) hours as needed for wheezing or shortness of breath. 6.7 g 0   ALPRAZolam (XANAX) 0.5 MG tablet Take 0.5 mg by mouth 2 (two) times daily.  2   amitriptyline (ELAVIL) 50 MG tablet Take 50 mg by mouth at bedtime.     amLODipine (NORVASC) 10 MG tablet Take 10 mg by mouth daily.     Bempedoic Acid-Ezetimibe (NEXLIZET) 180-10 MG TABS Take 1 tablet by mouth daily.     ezetimibe (ZETIA) 10 MG tablet Take 10 mg  by mouth daily.     hydrochlorothiazide (HYDRODIURIL) 12.5 MG tablet Take 12.5 mg by mouth daily.     loratadine (CLARITIN) 10 MG tablet Take 10 mg by mouth daily.     metFORMIN (GLUCOPHAGE-XR) 500 MG 24 hr tablet Take 1,000 mg by mouth 2 (two) times daily.      olmesartan (BENICAR) 20 MG tablet Take 1 tablet (20 mg total) by mouth daily. (Patient taking differently: Take 40 mg by mouth daily.)     oxyCODONE ER (XTAMPZA ER) 36 MG C12A Take 36 mg by mouth in the morning and at bedtime.     pantoprazole (PROTONIX) 40 MG tablet Take 40 mg by mouth daily.     Pediatric Multivitamins-Iron (FLINTSTONES PLUS EXTRA IRON PO) Take by mouth.     pregabalin (LYRICA) 100 MG capsule Take 100 mg by mouth 2 (two) times daily.     lidocaine-prilocaine (EMLA) cream Apply to affected area once 30 g 3   MOUNJARO 7.5 MG/0.5ML Pen Inject 7.5 mg into the skin once a week. Once a week on Monday. Last dose 1/15. (Patient not taking: Reported on 07/29/2022)     ondansetron (ZOFRAN) 8 MG tablet Take 1 tablet (8 mg total) by mouth every 8 (eight) hours as needed for nausea or vomiting. Start on the third day after chemotherapy. (Patient not taking: Reported on 08/17/2022) 30 tablet 1   prochlorperazine (COMPAZINE) 10 MG tablet TAKE 1 TABLET BY MOUTH EVERY 6 HOURS AS NEEDED FOR NAUSEA OR VOMITING. (Patient not taking: Reported on 08/17/2022) 30 tablet 1   traMADol (ULTRAM) 50 MG tablet Take 1 tablet (50 mg total) by mouth every 6 (six) hours as needed. (Patient not taking: Reported on 08/17/2022) 10 tablet 0   No current facility-administered medications for this visit.    Medication reconciliation is based on the patient's most recent medication list in the electronic medical record (EMR) including herbal products and OTC medications.   The patient's medication list was reviewed today with the patient? Yes   Drug-drug interactions (DDIs) DDIs were evaluated? Yes Significant DDIs identified?  Reviewed potential for pantoprazole and methotrexate interaction with patient as described above. She verbalized understanding and accepted the potential risk.  Drug-Food Interactions Drug-food interactions were evaluated? Yes Drug-food interactions identified? No   Follow-up Plan  Treatment  start date: 08/20/22 Port placement date: 08/12/22 We reviewed prescriptions, premedications, and the treatment regimen with patient's aunt per patient's request. Nothing specific about patient's condition was discussed. Possible side effects of the treatment regimen were reviewed as well as management strategies. We reviewed medication list with patient and possible drug interactions as notated above. Patient verbalized understanding and accepted the risk. Patient gave approval for [place] to review chemotherapy regimen with her aunt [name] and [name], chemotherapy nurse educator, served as a witness. Clinical pharmacy will assist Dr. Nicholas Lose and [facility] on an as needed basis going forward  [name] participated in the discussion, expressed understanding, and voiced agreement with the above plan. All questions were answered to her satisfaction. The patient was advised to contact the clinic at [phone] with any questions or concerns prior to her return visit.   I spent 60 minutes assessing the patient.  Raina Mina, RPH-CPP, 08/17/2022 10:27 AM  **Disclaimer: This note was dictated with voice recognition software. Similar sounding words can inadvertently be transcribed and this note may contain transcription errors which may not have been corrected upon publication of note.**

## 2022-08-17 NOTE — Progress Notes (Signed)
Patient Care Team: Holland Commons, FNP as PCP - General (Internal Medicine) Rockwell Germany, RN as Oncology Nurse Navigator Mauro Kaufmann, RN as Oncology Nurse Navigator Nicholas Lose, MD as Consulting Physician (Hematology and Oncology) Jovita Kussmaul, MD as Consulting Physician (General Surgery) Eppie Gibson, MD as Attending Physician (Radiation Oncology)  DIAGNOSIS:  Encounter Diagnoses  Name Primary?   Malignant neoplasm of upper-outer quadrant of right breast in female, estrogen receptor negative ([place]) Yes   Malignant neoplasm of overlapping sites of left breast in female, estrogen receptor positive ([place])     SUMMARY OF ONCOLOGIC HISTORY: Oncology History  Malignant neoplasm of upper-outer quadrant of right breast in female, estrogen receptor negative ([place])  05/14/2022 Initial Diagnosis   Screening mammogram detected bilateral breast abnormalities. 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%, PR 0%, HER2 negative, Ki-67 less than 1%), 1.4 cm mass (not biopsied) axilla negative Right breast: Calcifications 3 cm biopsy: Grade 2 IDC with DCIS ER 0%, PR 0%, HER2 negative, Ki-67 0%, axilla negative, additional distortion to be biopsied today   06/02/2022 Genetic Testing   [facility] CancerNext-Expanded Panel+RNA was Negative. Report date is 06/03/2022.  The CancerNext-Expanded gene panel offered by [facility] and includes sequencing, rearrangement, and RNA analysis for the following 77 genes: AIP, ALK, APC, ATM, AXIN2, BAP1, BARD1, BLM, BMPR1A, BRCA1, BRCA2, BRIP1, CDC73, CDH1, CDK4, CDKN1B, CDKN2A, CHEK2, CTNNA1, DICER1, FANCC, FH, FLCN, GALNT12, KIF1B, LZTR1, MAX, MEN1, MET, MLH1, MSH2, MSH3, MSH6, MUTYH, NBN, NF1, NF2, NTHL1, PALB2, PHOX2B, PMS2, POT1, PRKAR1A, PTCH1, PTEN, RAD51C, RAD51D, RB1, RECQL, RET, SDHA,  SDHAF2, SDHB, SDHC, SDHD, SMAD4, SMARCA4, SMARCB1, SMARCE1, STK11, SUFU, TMEM127, TP53, TSC1, TSC2, VHL and XRCC2 (sequencing and deletion/duplication); EGFR, EGLN1, HOXB13, KIT, MITF, PDGFRA, POLD1, and POLE (sequencing only); EPCAM and GREM1 (deletion/duplication only).     07/19/2022 Surgery   Bilateral mastectomies:  Left mastectomy: Grade 2 IDC 6.8 cm with intermediate grade DCIS, margins negative, ER 100%, PR 5%, HER2 negative (2+ IHC, FISH neg), Ki-67 less than 1% 0/5 lymph nodes;  Right mastectomy: Grade 2 IDC 8.5 cm and 1.5 cm with high-grade DCIS, margins negative, ER 95% (on biopsy it was read as negative), PR 0%, HER2 negative, Ki-67 15% 0/3 lymph nodes   08/20/2022 -  Chemotherapy   Patient is on Treatment Plan : BREAST Adjuvant CMF IV q21d     Malignant neoplasm of overlapping sites of left breast in female, estrogen receptor positive ([place])  05/24/2022 Initial Diagnosis   Malignant neoplasm of overlapping sites of left breast in female, estrogen receptor positive ([place])   05/26/2022 Cancer Staging   Staging form: Breast, AJCC 8th Edition - Clinical: Stage IB (cT2, cN0, cM0, G2, ER+, PR+, HER2-) - Signed by Nicholas Lose, MD on 05/26/2022 Histologic grading system: 3 grade system   06/02/2022 Genetic Testing   [facility] CancerNext-Expanded Panel+RNA was Negative. Report date is 06/03/2022.  The CancerNext-Expanded gene panel offered by [facility] and includes sequencing, rearrangement, and RNA analysis for the following 77 genes: AIP, ALK, APC, ATM, AXIN2, BAP1, BARD1, BLM, BMPR1A, BRCA1, BRCA2, BRIP1, CDC73, CDH1, CDK4, CDKN1B, CDKN2A, CHEK2, CTNNA1, DICER1, FANCC, FH, FLCN, GALNT12, KIF1B, LZTR1, MAX, MEN1, MET, MLH1, MSH2, MSH3, MSH6, MUTYH, NBN, NF1, NF2, NTHL1, PALB2, PHOX2B, PMS2, POT1, PRKAR1A, PTCH1, PTEN, RAD51C, RAD51D, RB1, RECQL, RET, SDHA, SDHAF2, SDHB, SDHC, SDHD,  SMAD4, SMARCA4, SMARCB1, SMARCE1, STK11, SUFU, TMEM127, TP53, TSC1, TSC2, VHL and XRCC2 (sequencing and  deletion/duplication); EGFR, EGLN1, HOXB13, KIT, MITF, PDGFRA, POLD1, and POLE (sequencing only); EPCAM and GREM1 (deletion/duplication only).     08/20/2022 -  Chemotherapy   Patient is on Treatment Plan : BREAST Adjuvant CMF IV q21d       CHIEF COMPLIANT: Day 1 cycle 1 CMF  INTERVAL HISTORY: [name] is a 62 y.o. female is here because of recent diagnosis of bilateral breast cancers. She presents to the clinic for a follow-up and treatment. She reports that she is doing fine. She has no issues or concerns to report to the clinic.  She is here to start chemotherapy.  She does not wish to get blood pressure taken from her arms and therefore the readings that were reported are inaccurate.  She does not report any fatigue or lightheadedness or dizziness or any other complaints.   ALLERGIES:  is allergic to codeine phosphate, lipitor [atorvastatin], simvastatin, sulfamethoxazole, and tape.  MEDICATIONS:  Current Outpatient Medications  Medication Sig Dispense Refill   albuterol (VENTOLIN HFA) 108 (90 Base) MCG/ACT inhaler Inhale 2 puffs into the lungs every 6 (six) hours as needed for wheezing or shortness of breath. 6.7 g 0   ALPRAZolam (XANAX) 0.5 MG tablet Take 0.5 mg by mouth 2 (two) times daily.  2   amitriptyline (ELAVIL) 50 MG tablet Take 50 mg by mouth at bedtime.     amLODipine (NORVASC) 10 MG tablet Take 10 mg by mouth daily.     Bempedoic Acid-Ezetimibe (NEXLIZET) 180-10 MG TABS Take 1 tablet by mouth daily.     ezetimibe (ZETIA) 10 MG tablet Take 10 mg by mouth daily.     hydrochlorothiazide (HYDRODIURIL) 12.5 MG tablet Take 12.5 mg by mouth daily.     loratadine (CLARITIN) 10 MG tablet Take 10 mg by mouth daily.     metFORMIN (GLUCOPHAGE-XR) 500 MG 24 hr tablet Take 1,000 mg by mouth 2 (two) times daily.     olmesartan (BENICAR) 20 MG tablet Take 1 tablet (20 mg total) by mouth daily. (Patient taking differently: Take 40 mg by mouth daily.)     oxyCODONE ER (XTAMPZA ER) 36  MG C12A Take 36 mg by mouth in the morning and at bedtime.     pantoprazole (PROTONIX) 40 MG tablet Take 40 mg by mouth daily.     Pediatric Multivitamins-Iron (FLINTSTONES PLUS EXTRA IRON PO) Take by mouth.     pregabalin (LYRICA) 100 MG capsule Take 100 mg by mouth 2 (two) times daily.     prochlorperazine (COMPAZINE) 10 MG tablet TAKE 1 TABLET BY MOUTH EVERY 6 HOURS AS NEEDED FOR NAUSEA OR VOMITING. 30 tablet 1   traMADol (ULTRAM) 50 MG tablet Take 1 tablet (50 mg total) by mouth every 6 (six) hours as needed. 10 tablet 0   lidocaine-prilocaine (EMLA) cream Apply to affected area once (Patient not taking: Reported on 08/20/2022) 30 g 3   MOUNJARO 7.5 MG/0.5ML Pen Inject 7.5 mg into the skin once a week. Once a week on Monday. Last dose 1/15. (Patient not taking: Reported on 08/20/2022)     ondansetron (ZOFRAN) 8 MG tablet Take 1 tablet (8 mg total) by mouth every 8 (eight) hours as needed for nausea or vomiting. Start on the third day after chemotherapy. (Patient not taking: Reported on 08/20/2022) 30 tablet 1   No current facility-administered medications for this visit.    PHYSICAL EXAMINATION: ECOG PERFORMANCE STATUS:  1 - Symptomatic but completely ambulatory  Vitals:   08/20/22 1128 08/20/22 1129  BP: (!) 87/55 (!) 73/57  Pulse: 93   Resp: 18   Temp: 97.9 F (36.6 C)   SpO2: 100%    Filed Weights   08/20/22 1128  Weight: 210 lb 4.8 oz (95.4 kg)      LABORATORY DATA:  I have reviewed the data as listed    Latest Ref Rng & Units 08/11/2022    8:51 AM 07/13/2022    9:48 AM 06/21/2022    7:43 AM  CMP  Glucose 70 - 99 mg/dL 279  206  130   BUN 8 - 23 mg/dL '13  18  16   '$ Creatinine 0.44 - 1.00 mg/dL 1.08  1.11  1.10   Sodium 135 - 145 mmol/L 136  138  135   Potassium 3.5 - 5.1 mmol/L 4.0  4.7  4.3   Chloride 98 - 111 mmol/L 99  100  99   CO2 22 - 32 mmol/L '26  26  26   '$ Calcium 8.9 - 10.3 mg/dL 9.2  9.0  9.0   Total Protein 6.5 - 8.1 g/dL 6.7     Total Bilirubin 0.3 - 1.2 mg/dL  0.5     Alkaline Phos 38 - 126 U/L 63     AST 15 - 41 U/L 29     ALT 0 - 44 U/L 32       Lab Results  Component Value Date   WBC 7.4 08/20/2022   HGB 13.2 08/20/2022   HCT 41.9 08/20/2022   MCV 91.3 08/20/2022   PLT 270 08/20/2022   NEUTROABS 4.8 08/20/2022    ASSESSMENT & PLAN:  Malignant neoplasm of upper-outer quadrant of right breast in female, estrogen receptor negative ([place]) Screening mammogram detected bilateral breast abnormalities. 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%, PR 0%, HER2 negative, Ki-67 less than 1%), 1.4 cm mass (not biopsied) axilla negative Right breast: Calcifications 3 cm biopsy: Grade 2 IDC with DCIS ER 0%, PR 0%, HER2 negative, Ki-67 0%, axilla negative, additional distortion to be biopsied today   Treatment plan: 07/19/2022: Bilateral mastectomies:  Left mastectomy: Grade 2 IDC 6.8 cm with intermediate grade DCIS, margins negative, ER 100%, PR 5%, HER2 negative (2+ IHC, FISH neg), Ki-67 less than 1% 0/5 lymph nodes;  Right mastectomy: Grade 2 IDC 8.5 cm and 1.5 cm with high-grade DCIS, margins negative, ER 95% (on biopsy it was read as negative), PR 0%, HER2 negative, Ki-67 15% 0/3 lymph nodes Adjuvant chemotherapy CMF x 6 cycles (selected because of her performance status) started 08/20/2022 Postmastectomy radiation Antiestrogen therapy   Patient needs an extremely unhealthy diet rich in cholesterol and fats and sugar.  She does not seem to have any interest in changing her diet. ------------------------------------------------------------------------------------------------------------------------ Current treatment: Cycle 1 CMF Labs reviewed, chemo education completed, chemo consent obtained, antiemetics ordered. Patient has chronic kidney disease and therefore we will have to reduce  the dose of methotrexate by 50%.  Return to clinic in 1 week for toxicity check    No orders of the defined types were placed in this encounter.  The patient has a good understanding of the overall plan. she agrees with it. she will call with any problems that may develop before the next visit here. Total  time spent: 30 mins including face to face time and time spent for planning, charting and co-ordination of care   Jessica Ohara, MD 08/20/22    I [name] am acting as a Education administrator for [employer]  I have reviewed the above documentation for accuracy and completeness, and I agree with the above.

## 2022-08-18 ENCOUNTER — Other Ambulatory Visit: Payer: Self-pay

## 2022-08-19 ENCOUNTER — Other Ambulatory Visit: Payer: BC Managed Care – PPO

## 2022-08-19 ENCOUNTER — Ambulatory Visit: Payer: BC Managed Care – PPO | Admitting: Adult Health

## 2022-08-19 ENCOUNTER — Ambulatory Visit: Payer: BC Managed Care – PPO

## 2022-08-19 MED FILL — Dexamethasone Sodium Phosphate Inj 100 MG/10ML: INTRAMUSCULAR | Qty: 1 | Status: AC

## 2022-08-20 ENCOUNTER — Inpatient Hospital Stay: Payer: BC Managed Care – PPO | Admitting: Hematology and Oncology

## 2022-08-20 ENCOUNTER — Encounter: Payer: Self-pay | Admitting: *Deleted

## 2022-08-20 ENCOUNTER — Inpatient Hospital Stay: Payer: BC Managed Care – PPO

## 2022-08-20 ENCOUNTER — Other Ambulatory Visit: Payer: Self-pay

## 2022-08-20 VITALS — BP 106/90 | HR 87 | Resp 18

## 2022-08-20 VITALS — BP 73/57 | HR 93 | Temp 97.9°F | Resp 18 | Ht 66.0 in | Wt 210.3 lb

## 2022-08-20 DIAGNOSIS — Z5111 Encounter for antineoplastic chemotherapy: Secondary | ICD-10-CM | POA: Diagnosis not present

## 2022-08-20 DIAGNOSIS — C50411 Malignant neoplasm of upper-outer quadrant of right female breast: Secondary | ICD-10-CM | POA: Diagnosis not present

## 2022-08-20 DIAGNOSIS — Z9013 Acquired absence of bilateral breasts and nipples: Secondary | ICD-10-CM | POA: Diagnosis not present

## 2022-08-20 DIAGNOSIS — Z171 Estrogen receptor negative status [ER-]: Secondary | ICD-10-CM | POA: Diagnosis not present

## 2022-08-20 DIAGNOSIS — Z95828 Presence of other vascular implants and grafts: Secondary | ICD-10-CM

## 2022-08-20 DIAGNOSIS — Z170 Estrogen receptor positive status [ER+]: Secondary | ICD-10-CM

## 2022-08-20 DIAGNOSIS — C50812 Malignant neoplasm of overlapping sites of left female breast: Secondary | ICD-10-CM

## 2022-08-20 DIAGNOSIS — Z79899 Other long term (current) drug therapy: Secondary | ICD-10-CM | POA: Diagnosis not present

## 2022-08-20 LAB — CBC WITH DIFFERENTIAL (CANCER CENTER ONLY)
Abs Immature Granulocytes: 0.02 10*3/uL (ref 0.00–0.07)
Basophils Absolute: 0 10*3/uL (ref 0.0–0.1)
Basophils Relative: 1 %
Eosinophils Absolute: 0.2 10*3/uL (ref 0.0–0.5)
Eosinophils Relative: 3 %
HCT: 41.9 % (ref 36.0–46.0)
Hemoglobin: 13.2 g/dL (ref 12.0–15.0)
Immature Granulocytes: 0 %
Lymphocytes Relative: 24 %
Lymphs Abs: 1.7 10*3/uL (ref 0.7–4.0)
MCH: 28.8 pg (ref 26.0–34.0)
MCHC: 31.5 g/dL (ref 30.0–36.0)
MCV: 91.3 fL (ref 80.0–100.0)
Monocytes Absolute: 0.6 10*3/uL (ref 0.1–1.0)
Monocytes Relative: 8 %
Neutro Abs: 4.8 10*3/uL (ref 1.7–7.7)
Neutrophils Relative %: 64 %
Platelet Count: 270 10*3/uL (ref 150–400)
RBC: 4.59 MIL/uL (ref 3.87–5.11)
RDW: 14.5 % (ref 11.5–15.5)
WBC Count: 7.4 10*3/uL (ref 4.0–10.5)
nRBC: 0 % (ref 0.0–0.2)

## 2022-08-20 LAB — CMP (CANCER CENTER ONLY)
ALT: 20 U/L (ref 0–44)
AST: 17 U/L (ref 15–41)
Albumin: 3.8 g/dL (ref 3.5–5.0)
Alkaline Phosphatase: 58 U/L (ref 38–126)
Anion gap: 12 (ref 5–15)
BUN: 42 mg/dL — ABNORMAL HIGH (ref 8–23)
CO2: 25 mmol/L (ref 22–32)
Calcium: 8.9 mg/dL (ref 8.9–10.3)
Chloride: 98 mmol/L (ref 98–111)
Creatinine: 2.31 mg/dL — ABNORMAL HIGH (ref 0.44–1.00)
GFR, Estimated: 23 mL/min — ABNORMAL LOW (ref >60)
Glucose, Bld: 356 mg/dL — ABNORMAL HIGH (ref 70–99)
Potassium: 4.4 mmol/L (ref 3.5–5.1)
Sodium: 135 mmol/L (ref 135–145)
Total Bilirubin: 0.3 mg/dL (ref 0.3–1.2)
Total Protein: 6.6 g/dL (ref 6.5–8.1)

## 2022-08-20 MED ORDER — SODIUM CHLORIDE 0.9% FLUSH
10.0000 mL | INTRAVENOUS | Status: DC | PRN
  Administered 2022-08-20: 10 mL

## 2022-08-20 MED ORDER — METHOTREXATE SODIUM CHEMO INJECTION (PF) 50 MG/2ML: 40.0000 mg/m2 | Freq: Once | INTRAMUSCULAR | Status: DC

## 2022-08-20 MED ORDER — SODIUM CHLORIDE 0.9% FLUSH
10.0000 mL | INTRAVENOUS | Status: AC | PRN
  Administered 2022-08-20: 10 mL

## 2022-08-20 MED ORDER — FLUOROURACIL CHEMO INJECTION 2.5 GM/50ML
600.0000 mg/m2 | Freq: Once | INTRAVENOUS | Status: AC
  Administered 2022-08-20: 1250 mg via INTRAVENOUS
  Filled 2022-08-20: qty 25

## 2022-08-20 MED ORDER — PALONOSETRON HCL INJECTION 0.25 MG/5ML
0.2500 mg | Freq: Once | INTRAVENOUS | Status: AC
  Administered 2022-08-20: 0.25 mg via INTRAVENOUS
  Filled 2022-08-20: qty 5

## 2022-08-20 MED ORDER — METHOTREXATE SODIUM CHEMO INJECTION (PF) 50 MG/2ML
20.0000 mg/m2 | Freq: Once | INTRAMUSCULAR | Status: AC
  Administered 2022-08-20: 42.25 mg via INTRAVENOUS
  Filled 2022-08-20: qty 1.69

## 2022-08-20 MED ORDER — SODIUM CHLORIDE 0.9 % IV SOLN: Freq: Once | INTRAVENOUS | Status: AC

## 2022-08-20 MED ORDER — SODIUM CHLORIDE 0.9 % IV SOLN
600.0000 mg/m2 | Freq: Once | INTRAVENOUS | Status: AC
  Administered 2022-08-20: 1260 mg via INTRAVENOUS
  Filled 2022-08-20: qty 63

## 2022-08-20 MED ORDER — HEPARIN SOD (PORK) LOCK FLUSH 100 UNIT/ML IV SOLN
500.0000 [IU] | Freq: Once | INTRAVENOUS | Status: AC | PRN
  Administered 2022-08-20: 500 [IU]

## 2022-08-20 MED ORDER — SODIUM CHLORIDE 0.9 % IV SOLN
10.0000 mg | Freq: Once | INTRAVENOUS | Status: AC
  Administered 2022-08-20: 10 mg via INTRAVENOUS
  Filled 2022-08-20: qty 10

## 2022-08-20 NOTE — Progress Notes (Signed)
Per MD okay to treat with BP 87/55.  BP was taken on pt leg due to bilateral mastectomies and is not as reliable per MD.

## 2022-08-20 NOTE — Progress Notes (Signed)
Per [name] - okay to proceed with creatinine of 2.31.

## 2022-08-20 NOTE — Assessment & Plan Note (Signed)
Screening mammogram detected bilateral breast abnormalities. 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%, PR 0%, HER2 negative, Ki-67 less than 1%), 1.4 cm mass (not biopsied) axilla negative Right breast: Calcifications 3 cm biopsy: Grade 2 IDC with DCIS ER 0%, PR 0%, HER2 negative, Ki-67 0%, axilla negative, additional distortion to be biopsied today   Treatment plan: 07/19/2022: Bilateral mastectomies:  Left mastectomy: Grade 2 IDC 6.8 cm with intermediate grade DCIS, margins negative, ER 100%, PR 5%, HER2 negative (2+ IHC, FISH neg), Ki-67 less than 1% 0/5 lymph nodes;  Right mastectomy: Grade 2 IDC 8.5 cm and 1.5 cm with high-grade DCIS, margins negative, ER 95% (on biopsy it was read as negative), PR 0%, HER2 negative, Ki-67 15% 0/3 lymph nodes Adjuvant chemotherapy CMF x 6 cycles (selected because of her performance status) started 08/20/2022 Postmastectomy radiation Antiestrogen therapy   Patient needs an extremely unhealthy diet rich in cholesterol and fats and sugar.  She does not seem to have any interest in changing her diet. ------------------------------------------------------------------------------------------------------------------------ Current treatment: Cycle 1 CMF Labs reviewed, chemo education completed, chemo consent obtained, antiemetics ordered. Return to clinic in 1 week for toxicity check

## 2022-08-20 NOTE — Progress Notes (Signed)
SCr = 2.31 today; CrCl = 37 mL/min. OK to reduce the MTX by 50% per Dr Lindi Adie.  Kennith Center, Pharm.D., CPP 08/20/2022'@12'$ :58 PM

## 2022-08-20 NOTE — Patient Instructions (Signed)
[facility]   Discharge Instructions: Thank you for choosing [name] to provide your oncology and hematology care.   If you have a lab appointment with the [facility], please go directly to the [facility] and check in at the registration area.   Wear comfortable clothing and clothing appropriate for easy access to any Portacath or PICC line.   We strive to give you quality time with your provider. You may need to reschedule your appointment if you arrive late (15 or more minutes).  Arriving late affects you and other patients whose appointments are after yours.  Also, if you miss three or more appointments without notifying the office, you may be dismissed from the clinic at the provider's discretion.      For prescription refill requests, have your pharmacy contact our office and allow 72 hours for refills to be completed.    Today you received the following chemotherapy and/or immunotherapy agents: Cyclophosphamide (Cytoxan), Methotrexate, Fluorouracil (Adrucil)      To help prevent nausea and vomiting after your treatment, we encourage you to take your nausea medication as directed.  BELOW ARE SYMPTOMS THAT SHOULD BE REPORTED IMMEDIATELY: *FEVER GREATER THAN 100.4 F (38 C) OR HIGHER *CHILLS OR SWEATING *NAUSEA AND VOMITING THAT IS NOT CONTROLLED WITH YOUR NAUSEA MEDICATION *UNUSUAL SHORTNESS OF BREATH *UNUSUAL BRUISING OR BLEEDING *URINARY PROBLEMS (pain or burning when urinating, or frequent urination) *BOWEL PROBLEMS (unusual diarrhea, constipation, pain near the anus) TENDERNESS IN MOUTH AND THROAT WITH OR WITHOUT PRESENCE OF ULCERS (sore throat, sores in mouth, or a toothache) UNUSUAL RASH, SWELLING OR PAIN  UNUSUAL VAGINAL DISCHARGE OR ITCHING   Items with * indicate a potential emergency and should be followed up as soon as possible or go to the Emergency Department if any problems should occur.  Please show the  CHEMOTHERAPY ALERT CARD or IMMUNOTHERAPY ALERT CARD at check-in to the Emergency Department and triage nurse.  Should you have questions after your visit or need to cancel or reschedule your appointment, please contact [facility]  Dept: [phone]  and follow the prompts.  Office hours are 8:00 a.m. to 4:30 p.m. Monday - Friday. Please note that voicemails left after 4:00 p.m. may not be returned until the following business day.  We are closed weekends and major holidays. You have access to a nurse at all times for urgent questions. Please call the main number to the clinic Dept: [phone] and follow the prompts.   For any non-urgent questions, you may also contact your provider using MyChart. We now offer e-Visits for anyone 68 and older to request care online for non-urgent symptoms. For details visit [email or web address].   Also download the MyChart app! Go to the app store, search "MyChart", open the app, select [facility], and log in with your MyChart username and password.

## 2022-08-23 ENCOUNTER — Telehealth: Payer: Self-pay | Admitting: Hematology and Oncology

## 2022-08-23 ENCOUNTER — Encounter: Payer: Self-pay | Admitting: Hematology and Oncology

## 2022-08-23 NOTE — Telephone Encounter (Signed)
Called pt to check to see how she did with her treatment.  Left message to return call to discuss.

## 2022-08-23 NOTE — Telephone Encounter (Signed)
Rescheduled appointments per patients request. Patient is aware of the changes made to her upcoming appointment.

## 2022-08-23 NOTE — Telephone Encounter (Signed)
-----   Message from Jessica Gilbert, RN sent at 08/20/2022  3:32 PM EST ----- Regarding: First Time CMF - [name] Patient First Time CMF - [name] Patient  Patient tolerated treatment well.

## 2022-08-24 ENCOUNTER — Other Ambulatory Visit: Payer: Self-pay | Admitting: *Deleted

## 2022-08-24 MED ORDER — PROMETHAZINE HCL 25 MG PO TABS: 25.0000 mg | ORAL_TABLET | Freq: Four times a day (QID) | ORAL | 0 refills | Status: DC | PRN

## 2022-08-24 NOTE — Progress Notes (Signed)
Received call from pt with complaint of ongoing nausea despite alternating Compazine 10 mg and Zofran 8 mg p.o.  Verbal orders received from MD for pt to be prescribed Phenergan 25 mg p.o Q 6 hrs PRN nausea and vomiting.  Prescription sent to pharmacy on file, pt educated and verbalized understanding.

## 2022-08-25 ENCOUNTER — Telehealth: Payer: Self-pay

## 2022-08-25 ENCOUNTER — Other Ambulatory Visit: Payer: Self-pay | Admitting: *Deleted

## 2022-08-25 DIAGNOSIS — Z170 Estrogen receptor positive status [ER+]: Secondary | ICD-10-CM

## 2022-08-25 NOTE — Telephone Encounter (Signed)
Patient provided the following information from Gilford Rile, PharmD, regarding the patient's use of protonix. Patient states that she has held the medication since the start of her treatment and will receive clarification on whether or not she can restart based on her updated lab results on Friday, 3/15.  Patient noting ongoing concern with nausea upon eating. Patient encouraged to try taking nausea medication prior to eating meals. Patient reports that she will try this and see if it helps.  Patient knows to call if she has any additional questions or concerns.

## 2022-08-25 NOTE — Telephone Encounter (Signed)
-----   Message from Raina Mina, [place] sent at 08/20/2022  4:04 PM EST ----- Regarding: RE: Protonix Use with Concurrent CMP Treatment  Hello,  We discussed the potential interaction during her chemotherapy education on 08/17/22. At the time, she said she would not stop pantoprazole because other options did not work and understood the risk of the potential increase of methotrexate concentrations. It is hard to say when it would be safe to restart pantoprazole because her kidney function is abnormal at baseline and so clearance is likely going to be delayed even further. She also has several other comorbidities. I encouraged her during the visit to drink plenty of fluids to try to improve the clearance and to contact Dr. Geralyn Flash clinic with any questions, concerns, fevers, side effects, etc.   Since she is coming back in one week for a toxicity check, she could also restart the pantoprazole after that visit if she is tolerating the regimen well or take it PRN until then.  Regards, John    ----------------------------------------------------- Linus Mako. Camille Bal, PharmD, [place], CPP  ----- Message ----- From: Severiano Gilbert, RN Sent: 08/20/2022   3:36 PM EST To: Tora Kindred, RPH; Raina Mina, RPH-CPP Subject: Protonix Use with Concurrent CMP Treatment     Hildred Priest! I had this patient today for Day 1 Cycle 1 CMF treatment. When reviewing her home medications, she noted that you had told her to stop taking her protonix. She held the drug the day of treatment, but wanted to know when she could go back to taking the medication, as OTC medications do not help her acid reflux.  I discussed the matter with Ginna and she feels that the patient can restart on Monday. We wanted to verify that this would be okay.  Thank you for your help and clarification, Junie Panning

## 2022-08-26 ENCOUNTER — Ambulatory Visit: Payer: BC Managed Care – PPO | Admitting: Adult Health

## 2022-08-26 ENCOUNTER — Encounter: Payer: Self-pay | Admitting: Hematology and Oncology

## 2022-08-26 ENCOUNTER — Other Ambulatory Visit: Payer: BC Managed Care – PPO

## 2022-08-26 NOTE — Progress Notes (Signed)
Called pt to introduce myself as her Financial Resource Specialist and to discuss the [employer] grant.  I left a msg requesting she return my call if she's interested in applying for the grant.  

## 2022-08-27 ENCOUNTER — Telehealth: Payer: Self-pay

## 2022-08-27 ENCOUNTER — Inpatient Hospital Stay: Payer: BC Managed Care – PPO

## 2022-08-27 ENCOUNTER — Inpatient Hospital Stay (HOSPITAL_BASED_OUTPATIENT_CLINIC_OR_DEPARTMENT_OTHER): Payer: BC Managed Care – PPO | Admitting: Adult Health

## 2022-08-27 ENCOUNTER — Other Ambulatory Visit: Payer: Self-pay

## 2022-08-27 ENCOUNTER — Encounter: Payer: Self-pay | Admitting: *Deleted

## 2022-08-27 ENCOUNTER — Encounter: Payer: Self-pay | Admitting: Adult Health

## 2022-08-27 DIAGNOSIS — E7800 Pure hypercholesterolemia, unspecified: Secondary | ICD-10-CM | POA: Insufficient documentation

## 2022-08-27 DIAGNOSIS — E1165 Type 2 diabetes mellitus with hyperglycemia: Secondary | ICD-10-CM | POA: Insufficient documentation

## 2022-08-27 DIAGNOSIS — C50812 Malignant neoplasm of overlapping sites of left female breast: Secondary | ICD-10-CM | POA: Diagnosis not present

## 2022-08-27 DIAGNOSIS — Z171 Estrogen receptor negative status [ER-]: Secondary | ICD-10-CM | POA: Diagnosis not present

## 2022-08-27 DIAGNOSIS — Z95828 Presence of other vascular implants and grafts: Secondary | ICD-10-CM

## 2022-08-27 DIAGNOSIS — Z170 Estrogen receptor positive status [ER+]: Secondary | ICD-10-CM | POA: Diagnosis not present

## 2022-08-27 DIAGNOSIS — I10 Essential (primary) hypertension: Secondary | ICD-10-CM

## 2022-08-27 DIAGNOSIS — E559 Vitamin D deficiency, unspecified: Secondary | ICD-10-CM | POA: Insufficient documentation

## 2022-08-27 DIAGNOSIS — Z79899 Other long term (current) drug therapy: Secondary | ICD-10-CM | POA: Diagnosis not present

## 2022-08-27 DIAGNOSIS — C50411 Malignant neoplasm of upper-outer quadrant of right female breast: Secondary | ICD-10-CM | POA: Diagnosis not present

## 2022-08-27 DIAGNOSIS — Z9013 Acquired absence of bilateral breasts and nipples: Secondary | ICD-10-CM | POA: Diagnosis not present

## 2022-08-27 DIAGNOSIS — Z5111 Encounter for antineoplastic chemotherapy: Secondary | ICD-10-CM | POA: Diagnosis not present

## 2022-08-27 LAB — CMP (CANCER CENTER ONLY)
ALT: 26 U/L (ref 0–44)
AST: 50 U/L — ABNORMAL HIGH (ref 15–41)
Albumin: 4 g/dL (ref 3.5–5.0)
Alkaline Phosphatase: 56 U/L (ref 38–126)
Anion gap: 11 (ref 5–15)
BUN: 21 mg/dL (ref 8–23)
CO2: 25 mmol/L (ref 22–32)
Calcium: 9.1 mg/dL (ref 8.9–10.3)
Chloride: 97 mmol/L — ABNORMAL LOW (ref 98–111)
Creatinine: 1.18 mg/dL — ABNORMAL HIGH (ref 0.44–1.00)
GFR, Estimated: 52 mL/min — ABNORMAL LOW (ref >60)
Glucose, Bld: 440 mg/dL — ABNORMAL HIGH (ref 70–99)
Potassium: 4.4 mmol/L (ref 3.5–5.1)
Sodium: 133 mmol/L — ABNORMAL LOW (ref 135–145)
Total Bilirubin: 0.6 mg/dL (ref 0.3–1.2)
Total Protein: 6.8 g/dL (ref 6.5–8.1)

## 2022-08-27 LAB — CBC WITH DIFFERENTIAL (CANCER CENTER ONLY)
Abs Immature Granulocytes: 0.01 10*3/uL (ref 0.00–0.07)
Basophils Absolute: 0 10*3/uL (ref 0.0–0.1)
Basophils Relative: 1 %
Eosinophils Absolute: 0.1 10*3/uL (ref 0.0–0.5)
Eosinophils Relative: 3 %
HCT: 40.5 % (ref 36.0–46.0)
Hemoglobin: 13.2 g/dL (ref 12.0–15.0)
Immature Granulocytes: 0 %
Lymphocytes Relative: 16 %
Lymphs Abs: 0.5 10*3/uL — ABNORMAL LOW (ref 0.7–4.0)
MCH: 28.8 pg (ref 26.0–34.0)
MCHC: 32.6 g/dL (ref 30.0–36.0)
MCV: 88.4 fL (ref 80.0–100.0)
Monocytes Absolute: 0.1 10*3/uL (ref 0.1–1.0)
Monocytes Relative: 2 %
Neutro Abs: 2.5 10*3/uL (ref 1.7–7.7)
Neutrophils Relative %: 78 %
Platelet Count: 213 10*3/uL (ref 150–400)
RBC: 4.58 MIL/uL (ref 3.87–5.11)
RDW: 13.6 % (ref 11.5–15.5)
WBC Count: 3.1 10*3/uL — ABNORMAL LOW (ref 4.0–10.5)
nRBC: 0 % (ref 0.0–0.2)

## 2022-08-27 MED ORDER — HEPARIN SOD (PORK) LOCK FLUSH 100 UNIT/ML IV SOLN
500.0000 [IU] | Freq: Once | INTRAVENOUS | Status: AC
  Administered 2022-08-27: 500 [IU]

## 2022-08-27 MED ORDER — SODIUM CHLORIDE 0.9% FLUSH
10.0000 mL | Freq: Once | INTRAVENOUS | Status: AC
  Administered 2022-08-27: 10 mL

## 2022-08-27 NOTE — Progress Notes (Signed)
[facility] Follow up:    [facility], [place] [address]   DIAGNOSIS:  Cancer Staging  Malignant neoplasm of overlapping sites of left breast in female, estrogen receptor positive ([name]) Staging form: Breast, AJCC 8th Edition - Clinical: Stage IB (cT2, cN0, cM0, G2, ER+, PR+, HER2-) - Signed by Nicholas Lose, MD on 05/26/2022 Histologic grading system: 3 grade system  Malignant neoplasm of upper-outer quadrant of right breast in female, estrogen receptor negative ([name]) Staging form: Breast, AJCC 8th Edition - Clinical stage from 05/26/2022: Stage IIB (cT2, cN0, cM0, G2, ER-, PR-, HER2-) - Unsigned Stage prefix: Initial diagnosis Histologic grading system: 3 grade system  I connected with Theodora Blow on 08/27/22 at  2:15 PM EDT by telephone and verified that I am speaking with the correct person using two identifiers.  I discussed the limitations, risks, security and privacy concerns of performing an evaluation and management service by telephone and the availability of in person appointments.  I also discussed with the patient that there may be a patient responsible charge related to this service. The patient expressed understanding and agreed to proceed.   Patient location: home Provider location: [facility] office  SUMMARY OF ONCOLOGIC HISTORY: Oncology History  Malignant neoplasm of upper-outer quadrant of right breast in female, estrogen receptor negative ([name])  05/14/2022 Initial Diagnosis   Screening mammogram detected bilateral breast abnormalities. 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%, PR 0%, HER2 negative, Ki-67 less than 1%), 1.4 cm mass (not biopsied) axilla negative Right breast: Calcifications 3 cm biopsy: Grade 2 IDC with DCIS ER 0%, PR  0%, HER2 negative, Ki-67 0%, axilla negative, additional distortion to be biopsied today   06/02/2022 Genetic Testing   [facility] CancerNext-Expanded Panel+RNA was Negative. Report date is 06/03/2022.  The CancerNext-Expanded gene panel offered by [facility] and includes sequencing, rearrangement, and RNA analysis for the following 77 genes: AIP, ALK, APC, ATM, AXIN2, BAP1, BARD1, BLM, BMPR1A, BRCA1, BRCA2, BRIP1, CDC73, CDH1, CDK4, CDKN1B, CDKN2A, CHEK2, CTNNA1, DICER1, FANCC, FH, FLCN, GALNT12, KIF1B, LZTR1, MAX, MEN1, MET, MLH1, MSH2, MSH3, MSH6, MUTYH, NBN, NF1, NF2, NTHL1, PALB2, PHOX2B, PMS2, POT1, PRKAR1A, PTCH1, PTEN, RAD51C, RAD51D, RB1, RECQL, RET, SDHA, SDHAF2, SDHB, SDHC, SDHD, SMAD4, SMARCA4, SMARCB1, SMARCE1, STK11, SUFU, TMEM127, TP53, TSC1, TSC2, VHL and XRCC2 (sequencing and deletion/duplication); EGFR, EGLN1, HOXB13, KIT, MITF, PDGFRA, POLD1, and POLE (sequencing only); EPCAM and GREM1 (deletion/duplication only).     07/19/2022 Surgery   Bilateral mastectomies:  Left mastectomy: Grade 2 IDC 6.8 cm with intermediate grade DCIS, margins negative, ER 100%, PR 5%, HER2 negative (2+ IHC, FISH neg), Ki-67 less than 1% 0/5 lymph nodes;  Right mastectomy: Grade 2 IDC 8.5 cm and 1.5 cm with high-grade DCIS, margins negative, ER 95% (on biopsy it was read as negative), PR 0%, HER2 negative, Ki-67 15% 0/3 lymph nodes   08/20/2022 -  Chemotherapy   Patient is on Treatment Plan : BREAST Adjuvant CMF IV q21d     Malignant neoplasm of overlapping sites of left breast in female, estrogen receptor positive ([name])  05/24/2022 Initial Diagnosis   Malignant neoplasm of overlapping sites of left breast in female, estrogen receptor positive ([name])   05/26/2022 Cancer Staging   Staging form: Breast, AJCC 8th Edition - Clinical: Stage IB (cT2, cN0,  cM0, G2, ER+, PR+, HER2-) - Signed by Nicholas Lose, MD on 05/26/2022 Histologic grading system: 3 grade system   06/02/2022 Genetic Testing   [facility]  CancerNext-Expanded Panel+RNA was Negative. Report date is 06/03/2022.  The CancerNext-Expanded gene panel offered by [facility] and includes sequencing, rearrangement, and RNA analysis for the following 77 genes: AIP, ALK, APC, ATM, AXIN2, BAP1, BARD1, BLM, BMPR1A, BRCA1, BRCA2, BRIP1, CDC73, CDH1, CDK4, CDKN1B, CDKN2A, CHEK2, CTNNA1, DICER1, FANCC, FH, FLCN, GALNT12, KIF1B, LZTR1, MAX, MEN1, MET, MLH1, MSH2, MSH3, MSH6, MUTYH, NBN, NF1, NF2, NTHL1, PALB2, PHOX2B, PMS2, POT1, PRKAR1A, PTCH1, PTEN, RAD51C, RAD51D, RB1, RECQL, RET, SDHA, SDHAF2, SDHB, SDHC, SDHD, SMAD4, SMARCA4, SMARCB1, SMARCE1, STK11, SUFU, TMEM127, TP53, TSC1, TSC2, VHL and XRCC2 (sequencing and deletion/duplication); EGFR, EGLN1, HOXB13, KIT, MITF, PDGFRA, POLD1, and POLE (sequencing only); EPCAM and GREM1 (deletion/duplication only).     08/20/2022 -  Chemotherapy   Patient is on Treatment Plan : BREAST Adjuvant CMF IV q21d       CURRENT THERAPY: Adjuvant CMF  INTERVAL HISTORY: [name] 62 y.o. female returns for f/u after receiving her first cycle of CMF.  She tells me that she tolerated treatment moderately well.  She did experience some nausea and realized that she was not taking her antinausea medicine correctly.  She tells me that she has figured this out and knows how she is going to take it with her next cycle.  She is drinking what she thinks might be 4 bottles of water today.  She was initially scheduled as an in person visit however she noticed her blood sugar was elevated this morning so she came into our office and had labs done and then left.  She denies any fevers or chills.  She is slightly fatigued but otherwise is feeling well.   Patient Active Problem List   Diagnosis Date Noted   Port-A-Cath in place 08/27/2022   Type 2 diabetes mellitus with hyperglycemia ([place]) 08/27/2022   Vitamin D deficiency 08/27/2022   Pure hypercholesterolemia 08/27/2022   Bilateral breast cancer ([place]) 07/19/2022    Atypical pneumonia 06/19/2022   Acute hypoxemic respiratory failure ([place]) 06/19/2022   Severe sepsis ([place]) 06/19/2022   AKI (acute kidney injury) ([place]) 06/19/2022   Acute pyelonephritis [id number]   Acute metabolic encephalopathy [id number]   Genetic testing 06/03/2022   Malignant neoplasm of upper-outer quadrant of right breast in female, estrogen receptor negative ([place]) 05/24/2022   Malignant neoplasm of overlapping sites of left breast in female, estrogen receptor positive ([place]) 05/24/2022   Acute respiratory failure due to COVID-19 [facility]) 07/13/2020   Acute encephalopathy 07/13/2020   Renal insufficiency 07/13/2020   Neuritis 04/23/2015   Neuralgia 04/23/2015   Neuropathy 04/23/2015   Hyperalgesia 04/23/2015   Allodynia 04/23/2015   Obese 08/02/2012   Anxiety 06/07/2011   Hyperlipidemia    NECK PAIN 04/20/2010   Essential hypertension 11/29/2008   TOBACCO USE 09/30/2007   SYNDROME, CHRONIC PAIN 03/24/2007   MENOPAUSAL DISORDER 03/24/2007   GERD 01/04/2007   CHOLELITHIASIS 01/04/2007    is allergic to codeine phosphate, lipitor [atorvastatin], simvastatin, sulfamethoxazole, and tape.  MEDICAL HISTORY: Past Medical History:  Diagnosis Date   Anemia    Arthritis    Asthma    Bladder spasms    Breast cancer ([place])    Diabetes mellitus without complication (HCC)    GERD (gastroesophageal reflux disease)    Heart murmur    Hyperlipidemia    Hypertension     SURGICAL HISTORY: Past Surgical History:  Procedure Laterality Date   BACK SURGERY  1989, 1990, 2006   BREAST BIOPSY Left 05/14/2022   [place] LT BREAST BX W LOC DEV 1ST LESION IMG BX SPEC US GUIDE 05/14/2022 [facility]   BREAST BIOPSY Right 05/14/2022   MM RT BREAST BX W LOC DEV 1ST LESION IMAGE BX SPEC STEREO GUIDE 05/14/2022 [facility]   BREAST BIOPSY Left 05/14/2022   MM LT BREAST BX W LOC DEV 1ST LESION IMAGE BX SPEC STEREO GUIDE 05/14/2022 [facility]   BREAST BIOPSY Right 05/26/2022   MM  RT BREAST BX W LOC DEV 1ST LESION IMAGE BX SPEC STEREO GUIDE 05/26/2022 [facility]   BREAST BIOPSY Right 05/26/2022   MM RT BREAST BX W LOC DEV EA AD LESION IMG BX SPEC STEREO GUIDE 05/26/2022 [facility]   BREAST LUMPECTOMY  06/14/1974   bi-lat , both benign    CHOLECYSTECTOMY  06/14/1996   LEG SURGERY  06/15/2007   work related injury   PARTIAL HYSTERECTOMY     still has ovaries   PORTACATH PLACEMENT Right 08/12/2022   Procedure: INSERTION PORT-A-CATH;  Surgeon: Jovita Kussmaul, MD;  Location: [facility];  Service: General;  Laterality: Right;   SENTINEL NODE BIOPSY Bilateral 07/19/2022   Procedure: BILATERAL SENTINEL NODE BIOPSY;  Surgeon: Jovita Kussmaul, MD;  Location: [facility];  Service: General;  Laterality: Bilateral;   TOTAL MASTECTOMY Bilateral 07/19/2022   Procedure: BILATERAL TOTAL MASTECTOMY;  Surgeon: Jovita Kussmaul, MD;  Location: [facility];  Service: General;  Laterality: Bilateral;    SOCIAL HISTORY: Social History   Socioeconomic History   Marital status: Married    Spouse name: Ricky    Number of children: 2   Years of education: 9   Highest education level: Not on file  Occupational History   Occupation: Pensions consultant  Tobacco Use   Smoking status: Former    Packs/day: 0.50    Years: 37.00    Additional pack years: 0.00    Total pack years: 18.50    Types: Cigarettes    Quit date: 06/14/2013    Years since quitting: 9.2   Smokeless tobacco: Never  Vaping Use   Vaping Use: Never used  Substance and Sexual Activity   Alcohol use: No   Drug use: No   Sexual activity: Never    Comment: 62 YEARS OLD, NO MORE THAN 5 PARTNERS  Other Topics Concern   Not on file  Social History Narrative   Lives at home with husband   Caffeine use: none   Social Determinants of Health   Financial Resource Strain: Not on file  Food Insecurity: No Food Insecurity (06/19/2022)   Hunger Vital Sign    Worried About Running Out of Food in the Last Year: Never true    [place] in the Last Year: Never true  Transportation Needs: No Transportation Needs (06/19/2022)   PRAPARE - Hydrologist (Medical): No    Lack of Transportation (Non-Medical): No  Physical Activity: Not on file  Stress: Not on file  Social Connections: Not on file  Intimate Partner Violence: Not on file    FAMILY HISTORY: Family History  Problem Relation Age of Onset   Diabetes Mother    Hypertension Mother    Heart disease Mother    Hypertension Father    Diabetes Father    Heart disease Father    Breast cancer Sister 47   Lymphoma Paternal Grandfather  dx after 50   Neuropathy Neg Hx     Review of Systems  Constitutional:  Positive for fatigue. Negative for appetite change, chills, fever and unexpected weight change.  HENT:   Negative for hearing loss, lump/mass and trouble swallowing.   Eyes:  Negative for eye problems and icterus.  Respiratory:  Negative for chest tightness, cough and shortness of breath.   Cardiovascular:  Negative for chest pain, leg swelling and palpitations.  Gastrointestinal:  Negative for abdominal distention, abdominal pain, constipation, diarrhea, nausea and vomiting.  Endocrine: Negative for hot flashes.  Genitourinary:  Negative for difficulty urinating.   Musculoskeletal:  Negative for arthralgias.  Skin:  Negative for itching and rash.  Neurological:  Negative for dizziness, extremity weakness, headaches and numbness.  Hematological:  Negative for adenopathy. Does not bruise/bleed easily.  Psychiatric/Behavioral:  Negative for depression. The patient is not nervous/anxious.       PHYSICAL EXAMINATION Patient sounds well she is in no apparent distress.  Mood and behavior are normal, speech is normal, breathing is nonlabored  LABORATORY DATA:  CBC    Component Value Date/Time   WBC 3.1 (L) 08/27/2022 0753   WBC 6.0 08/11/2022 0851   RBC 4.58 08/27/2022 0753   HGB 13.2 08/27/2022 0753   HCT 40.5  08/27/2022 0753   PLT 213 08/27/2022 0753   MCV 88.4 08/27/2022 0753   MCH 28.8 08/27/2022 0753   MCHC 32.6 08/27/2022 0753   RDW 13.6 08/27/2022 0753   LYMPHSABS 0.5 (L) 08/27/2022 0753   MONOABS 0.1 08/27/2022 0753   EOSABS 0.1 08/27/2022 0753   BASOSABS 0.0 08/27/2022 0753    CMP     Component Value Date/Time   NA 133 (L) 08/27/2022 0753   K 4.4 08/27/2022 0753   CL 97 (L) 08/27/2022 0753   CO2 25 08/27/2022 0753   GLUCOSE 440 (H) 08/27/2022 0753   GLUCOSE 109 (H) 06/10/2006 1007   BUN 21 08/27/2022 0753   CREATININE 1.18 (H) 08/27/2022 0753   CALCIUM 9.1 08/27/2022 0753   PROT 6.8 08/27/2022 0753   ALBUMIN 4.0 08/27/2022 0753   AST 50 (H) 08/27/2022 0753   ALT 26 08/27/2022 0753   ALKPHOS 56 08/27/2022 0753   BILITOT 0.6 08/27/2022 0753   GFRNONAA 52 (L) 08/27/2022 0753   GFRAA  08/11/2010 0505    >60        The eGFR has been calculated using the MDRD equation. This calculation has not been validated in all clinical situations. eGFR's persistently <60 mL/min signify possible Chronic Kidney Disease.      ASSESSMENT and THERAPY PLAN:   Malignant neoplasm of upper-outer quadrant of right breast in female, estrogen receptor negative ([place]) Kajah is a 62 year old woman with left-sided stage IIb triple negative breast cancer and right-sided stage Ib ER/PR positive breast cancer both diagnosed in December 2023.  She is status post bilateral mastectomies and was recently started on adjuvant chemotherapy with CMF.  Treatment plan: Bilateral mastectomies with sentinel lymph node biopsy Adjuvant chemotherapy Postmastectomy radiation Adjuvant antiestrogen therapy  Side effects: Fatigue: I recommended energy conservation and small amounts of activity Hyperglycemia: She will recheck her blood sugar and call her primary care provider today to discuss additional medications.  Referral placed to [place] case manager for additional support. Nausea: We reviewed how to  take the prochlorperazine with her next cycle I recommended she increase her water intake over the weekend.    We will see Grady back in 2 weeks for labs, f/u,  and her next CMF cycle.    Follow up instructions:    -Return to cancer center 09/10/2022 for labs, f/u, and her next chemotherapy   The patient was provided an opportunity to ask questions and all were answered. The patient agreed with the plan and demonstrated an understanding of the instructions.   The patient was advised to call back or seek an in-person evaluation if the symptoms worsen or if the condition fails to improve as anticipated.   I provided 25 minutes of non face-to-face telephone visit time during this encounter, and > 50% was spent counseling as documented under my assessment & plan.  Wilber Bihari, NP 08/27/22 2:40 PM Medical Oncology and Hematology [facility] [place], [place] [address] Tel. [phone]    Fax. [phone]

## 2022-08-27 NOTE — Assessment & Plan Note (Addendum)
[name] is a 62 year old woman with left-sided stage IIb triple negative breast cancer and right-sided stage Ib ER/PR positive breast cancer both diagnosed in December 2023.  She is status post bilateral mastectomies and was recently started on adjuvant chemotherapy with CMF.  Treatment plan: Bilateral mastectomies with sentinel lymph node biopsy Adjuvant chemotherapy Postmastectomy radiation Adjuvant antiestrogen therapy  Side effects: Fatigue: I recommended energy conservation and small amounts of activity Hyperglycemia: She will recheck her blood sugar and call her primary care provider today to discuss additional medications.  Referral placed to [place] case manager for additional support. Nausea: We reviewed how to take the prochlorperazine with her next cycle I recommended she increase her water intake over the weekend.    We will see [name] back in 2 weeks for labs, f/u, and her next CMF cycle.

## 2022-08-27 NOTE — Progress Notes (Signed)
Per NP request RN successfully faxed recent lab results to pt PCP Dr. Shelia Media [phone]).  Pt recent blood glucose 440 and pt states she has stopped Mounjaro.  Note added for PCP to see pt urgently for evaluation and medication management.

## 2022-08-27 NOTE — Patient Outreach (Signed)
Received a referral from [place] [facility].  .  I have sent a referral to the [place], [place], [place] Management [phone]

## 2022-08-30 ENCOUNTER — Telehealth: Payer: Self-pay | Admitting: *Deleted

## 2022-08-30 ENCOUNTER — Encounter: Payer: Self-pay | Admitting: *Deleted

## 2022-08-30 NOTE — Progress Notes (Unsigned)
  Care Coordination  Outreach Note  08/30/2022 Name: [name] MRN: [id number] DOB: [birth year]   Care Coordination Outreach Attempts: An unsuccessful telephone outreach was attempted today to offer the patient information about available care coordination services as a benefit of their health plan.   Referral received   Follow Up Plan:  Additional outreach attempts will be made to offer the patient care coordination information and services.   Encounter Outcome:  No Answer  Julian Hy, [place] Direct Dial: [phone]

## 2022-08-31 ENCOUNTER — Telehealth: Payer: Self-pay | Admitting: Hematology and Oncology

## 2022-08-31 ENCOUNTER — Telehealth: Payer: Self-pay | Admitting: *Deleted

## 2022-08-31 NOTE — Telephone Encounter (Signed)
Called patient regarding upcoming appointments, patient is notified. 

## 2022-08-31 NOTE — Telephone Encounter (Signed)
   Telephone encounter was:  Unsuccessful.  08/31/2022 Name: DEANNA HRITZ MRN: [id number] DOB: 20-May-1961  Unsuccessful outbound call made today to assist with:  Food Insecurity  Outreach Attempt:  1st Attempt  A HIPAA compliant voice message was left requesting a return call.  Instructed patient to call back at [phone].  [place] [facility] [phone] [address] , [place] [address] Email : [email or web address]

## 2022-09-01 NOTE — Progress Notes (Signed)
  Care Coordination  Outreach Note  09/01/2022 Name: [name] MRN: [id number] DOB: [birth year]   Care Coordination Outreach Attempts: A second unsuccessful outreach was attempted today to offer the patient with information about available care coordination services as a benefit of their health plan.     Follow Up Plan:  Additional outreach attempts will be made to offer the patient care coordination information and services.   Encounter Outcome:  No Answer  Julian Hy, [place] Direct Dial: [phone]

## 2022-09-01 NOTE — Progress Notes (Signed)
  Care Coordination   Note   09/01/2022 Name: [name] MRN: [id number] DOB: 02/04/1961  [name] is a 62 y.o. year old female who sees Adria Dill, [place], [place] for primary care. I reached out to [name] by phone today to offer care coordination services.  [name] was given information about Care Coordination services today including:   The Care Coordination services include support from the care team which includes your Nurse Coordinator, Clinical Social Worker, or Pharmacist.  The Care Coordination team is here to help remove barriers to the health concerns and goals most important to you. Care Coordination services are voluntary, and the patient may decline or stop services at any time by request to their care team member.   Care Coordination Consent Status: Patient agreed to services and verbal consent obtained.   Follow up plan:  Telephone appointment with care coordination team member scheduled for:  09/06/2022  Encounter Outcome:  Pt. Scheduled from referral   Julian Hy, [place] Direct Dial: [phone]

## 2022-09-03 ENCOUNTER — Other Ambulatory Visit: Payer: Self-pay | Admitting: Hematology and Oncology

## 2022-09-03 DIAGNOSIS — C50812 Malignant neoplasm of overlapping sites of left female breast: Secondary | ICD-10-CM

## 2022-09-03 DIAGNOSIS — C50411 Malignant neoplasm of upper-outer quadrant of right female breast: Secondary | ICD-10-CM

## 2022-09-06 ENCOUNTER — Telehealth: Payer: Self-pay | Admitting: *Deleted

## 2022-09-06 ENCOUNTER — Ambulatory Visit: Payer: Self-pay

## 2022-09-06 NOTE — Telephone Encounter (Signed)
   Telephone encounter was:  Successful.  09/06/2022 Name: JANNENE ZELAZNY MRN: [id number] DOB: [birth year]  Harvest Dark is a 62 y.o. year old female who is a primary care patient of [facility], [place] . The community resource team was consulted for assistance with [place] guide performed the following interventions: Patient provided with information about care guide support team and interviewed to confirm resource needs Follow up call placed to community resources to determine status of patients referral.  Follow Up Plan:  Care guide will follow up with patient by phone over the next week  [place] [address] , [place] [address] Email : [email or web address]

## 2022-09-06 NOTE — Patient Outreach (Signed)
  Care Coordination   Initial Visit Note   09/06/2022 Name: [name] MRN: [id number] DOB: 11/28/1960  [name] is a 62 y.o. year old female who sees Adria Dill, [place], [place] for primary care. I spoke with  [name] by phone today.  What matters to the patients health and wellness today?  Beating breast cancer    Goals Addressed             This Visit's Progress    Beating Breast Cancer       Patient Goals/Self Care Activities: -Patient/Caregiver will self-administer medications as prescribed as evidenced by self-report/primary caregiver report  -Patient/Caregiver will attend all scheduled provider appointments as evidenced by clinician review of documented attendance to scheduled appointments and patient/caregiver report -Patient/Caregiver will call provider office for new concerns or questions as evidenced by review of documented incoming telephone call notes and patient report  -check blood sugar at prescribed times -record values and write them down take them to all doctor visits   -Patient in current treatment for breast cancer- still has drain tubes in.  No signs of infection. Chemo every 3rd Friday-no problems with transportation.   Blood sugars up and down depending on what she eats. Patient to try MOM's meals.  Interventions Today    Flowsheet Row Most Recent Value  Chronic Disease   Chronic disease during today's visit Other  [Breast Cancer]  General Interventions   General Interventions Discussed/Reviewed General Interventions Discussed, Doctor Visits  Doctor Visits Discussed/Reviewed Specialist, Doctor Visits Discussed  PCP/Specialist Visits Compliance with follow-up visit  Education Interventions   Education Provided Provided Education  Provided Verbal Education On When to see the doctor, Other  [Wound care and signs of infection]  Mental Health Interventions   Mental Health Discussed/Reviewed Mental Health Discussed, Depression  Nutrition  Interventions   Nutrition Discussed/Reviewed Nutrition Discussed, Fluid intake, Decreasing sugar intake  Pharmacy Interventions   Pharmacy Dicussed/Reviewed Pharmacy Topics Discussed, Medications and their functions  Safety Interventions   Safety Discussed/Reviewed Safety Discussed                SDOH assessments and interventions completed:  Yes  SDOH Interventions Today    Flowsheet Row Most Recent Value  SDOH Interventions   Housing Interventions Intervention Not Indicated  Transportation Interventions Intervention Not Indicated  Utilities Interventions Intervention Not Indicated        Care Coordination Interventions:  Yes, provided   Follow up plan: Follow up call scheduled for April    Encounter Outcome:  Pt. Visit Completed   Jone Baseman, RN, MSN [place] [facility] Care Management Coordinator Direct Line [phone]

## 2022-09-06 NOTE — Patient Instructions (Signed)
Visit Information  Thank you for taking time to visit with me today. Please don't hesitate to contact me if I can be of assistance to you.   Following are the goals we discussed today:   Goals Addressed             This Visit's Progress    Beating Breast Cancer       Patient Goals/Self Care Activities: -Patient/Caregiver will self-administer medications as prescribed as evidenced by self-report/primary caregiver report  -Patient/Caregiver will attend all scheduled provider appointments as evidenced by clinician review of documented attendance to scheduled appointments and patient/caregiver report -Patient/Caregiver will call provider office for new concerns or questions as evidenced by review of documented incoming telephone call notes and patient report  -check blood sugar at prescribed times -record values and write them down take them to all doctor visits   -Patient in current treatment for breast cancer- still has drain tubes in.  No signs of infection. Chemo every 3rd Friday-no problems with transportation.   Blood sugars up and down depending on what she eats. Patient to try MOM's meals.  Interventions Today    Flowsheet Row Most Recent Value  Chronic Disease   Chronic disease during today's visit Other  [Breast Cancer]  General Interventions   General Interventions Discussed/Reviewed General Interventions Discussed, Doctor Visits  Doctor Visits Discussed/Reviewed Specialist, Doctor Visits Discussed  PCP/Specialist Visits Compliance with follow-up visit  Education Interventions   Education Provided Provided Education  Provided Verbal Education On When to see the doctor, Other  [Wound care and signs of infection]  Mental Health Interventions   Mental Health Discussed/Reviewed Mental Health Discussed, Depression  Nutrition Interventions   Nutrition Discussed/Reviewed Nutrition Discussed, Fluid intake, Decreasing sugar intake  Pharmacy Interventions   Pharmacy  Dicussed/Reviewed Pharmacy Topics Discussed, Medications and their functions  Safety Interventions   Safety Discussed/Reviewed Safety Discussed                Our next appointment is by telephone on 09/27/22  at 1000  Please call the care guide team at [phone] if you need to cancel or reschedule your appointment.   If you are experiencing a Mental Health or [place] or need someone to talk to, please call the Suicide and Crisis Lifeline: 988   The patient verbalized understanding of instructions, educational materials, and care plan provided today and agreed to receive a mailed copy of patient instructions, educational materials, and care plan.   The patient has been provided with contact information for the care management team and has been advised to call with any health related questions or concerns.   Jone Baseman, RN, MSN [place] [facility] Care Management Coordinator Direct Line [phone]

## 2022-09-09 MED FILL — Dexamethasone Sodium Phosphate Inj 100 MG/10ML: INTRAMUSCULAR | Qty: 1 | Status: AC

## 2022-09-10 ENCOUNTER — Inpatient Hospital Stay (HOSPITAL_BASED_OUTPATIENT_CLINIC_OR_DEPARTMENT_OTHER): Payer: BC Managed Care – PPO | Admitting: Physician Assistant

## 2022-09-10 ENCOUNTER — Other Ambulatory Visit: Payer: Self-pay | Admitting: Hematology and Oncology

## 2022-09-10 ENCOUNTER — Inpatient Hospital Stay: Payer: BC Managed Care – PPO

## 2022-09-10 ENCOUNTER — Other Ambulatory Visit: Payer: Self-pay

## 2022-09-10 VITALS — HR 99

## 2022-09-10 VITALS — BP 93/51 | HR 103 | Temp 97.7°F | Resp 18 | Wt 208.9 lb

## 2022-09-10 DIAGNOSIS — Z5111 Encounter for antineoplastic chemotherapy: Secondary | ICD-10-CM | POA: Diagnosis not present

## 2022-09-10 DIAGNOSIS — Z170 Estrogen receptor positive status [ER+]: Secondary | ICD-10-CM

## 2022-09-10 DIAGNOSIS — Z171 Estrogen receptor negative status [ER-]: Secondary | ICD-10-CM

## 2022-09-10 DIAGNOSIS — C50812 Malignant neoplasm of overlapping sites of left female breast: Secondary | ICD-10-CM

## 2022-09-10 DIAGNOSIS — R7989 Other specified abnormal findings of blood chemistry: Secondary | ICD-10-CM

## 2022-09-10 DIAGNOSIS — Z9013 Acquired absence of bilateral breasts and nipples: Secondary | ICD-10-CM | POA: Diagnosis not present

## 2022-09-10 DIAGNOSIS — C50411 Malignant neoplasm of upper-outer quadrant of right female breast: Secondary | ICD-10-CM

## 2022-09-10 DIAGNOSIS — Z79899 Other long term (current) drug therapy: Secondary | ICD-10-CM | POA: Diagnosis not present

## 2022-09-10 DIAGNOSIS — Z95828 Presence of other vascular implants and grafts: Secondary | ICD-10-CM

## 2022-09-10 LAB — CBC WITH DIFFERENTIAL (CANCER CENTER ONLY)
Abs Immature Granulocytes: 0.26 10*3/uL — ABNORMAL HIGH (ref 0.00–0.07)
Basophils Absolute: 0.1 10*3/uL (ref 0.0–0.1)
Basophils Relative: 2 %
Eosinophils Absolute: 0.1 10*3/uL (ref 0.0–0.5)
Eosinophils Relative: 1 %
HCT: 37 % (ref 36.0–46.0)
Hemoglobin: 12.2 g/dL (ref 12.0–15.0)
Immature Granulocytes: 5 %
Lymphocytes Relative: 17 %
Lymphs Abs: 0.8 10*3/uL (ref 0.7–4.0)
MCH: 29 pg (ref 26.0–34.0)
MCHC: 33 g/dL (ref 30.0–36.0)
MCV: 87.9 fL (ref 80.0–100.0)
Monocytes Absolute: 0.6 10*3/uL (ref 0.1–1.0)
Monocytes Relative: 12 %
Neutro Abs: 3.2 10*3/uL (ref 1.7–7.7)
Neutrophils Relative %: 63 %
Platelet Count: 282 10*3/uL (ref 150–400)
RBC: 4.21 MIL/uL (ref 3.87–5.11)
RDW: 14.7 % (ref 11.5–15.5)
WBC Count: 5 10*3/uL (ref 4.0–10.5)
nRBC: 0.6 % — ABNORMAL HIGH (ref 0.0–0.2)

## 2022-09-10 LAB — CMP (CANCER CENTER ONLY)
ALT: 24 U/L (ref 0–44)
AST: 21 U/L (ref 15–41)
Albumin: 3.6 g/dL (ref 3.5–5.0)
Alkaline Phosphatase: 62 U/L (ref 38–126)
Anion gap: 9 (ref 5–15)
BUN: 20 mg/dL (ref 8–23)
CO2: 27 mmol/L (ref 22–32)
Calcium: 8.9 mg/dL (ref 8.9–10.3)
Chloride: 99 mmol/L (ref 98–111)
Creatinine: 1.41 mg/dL — ABNORMAL HIGH (ref 0.44–1.00)
GFR, Estimated: 42 mL/min — ABNORMAL LOW (ref >60)
Glucose, Bld: 175 mg/dL — ABNORMAL HIGH (ref 70–99)
Potassium: 4.2 mmol/L (ref 3.5–5.1)
Sodium: 135 mmol/L (ref 135–145)
Total Bilirubin: 0.4 mg/dL (ref 0.3–1.2)
Total Protein: 6.8 g/dL (ref 6.5–8.1)

## 2022-09-10 MED ORDER — SODIUM CHLORIDE 0.9 % IV SOLN: INTRAVENOUS | Status: AC

## 2022-09-10 MED ORDER — SODIUM CHLORIDE 0.9% FLUSH
10.0000 mL | INTRAVENOUS | Status: DC | PRN
  Administered 2022-09-10: 10 mL

## 2022-09-10 MED ORDER — SODIUM CHLORIDE 0.9 % IV SOLN: Freq: Once | INTRAVENOUS | Status: AC

## 2022-09-10 MED ORDER — SODIUM CHLORIDE 0.9% FLUSH
10.0000 mL | Freq: Once | INTRAVENOUS | Status: AC
  Administered 2022-09-10: 10 mL

## 2022-09-10 MED ORDER — SODIUM CHLORIDE 0.9 % IV SOLN
10.0000 mg | Freq: Once | INTRAVENOUS | Status: AC
  Administered 2022-09-10: 10 mg via INTRAVENOUS
  Filled 2022-09-10: qty 10

## 2022-09-10 MED ORDER — HEPARIN SOD (PORK) LOCK FLUSH 100 UNIT/ML IV SOLN
500.0000 [IU] | Freq: Once | INTRAVENOUS | Status: AC | PRN
  Administered 2022-09-10: 500 [IU]

## 2022-09-10 MED ORDER — SODIUM CHLORIDE 0.9 % IV SOLN
600.0000 mg/m2 | Freq: Once | INTRAVENOUS | Status: AC
  Administered 2022-09-10: 1260 mg via INTRAVENOUS
  Filled 2022-09-10: qty 63

## 2022-09-10 MED ORDER — PALONOSETRON HCL INJECTION 0.25 MG/5ML
0.2500 mg | Freq: Once | INTRAVENOUS | Status: AC
  Administered 2022-09-10: 0.25 mg via INTRAVENOUS
  Filled 2022-09-10: qty 5

## 2022-09-10 MED ORDER — FLUOROURACIL CHEMO INJECTION 2.5 GM/50ML
600.0000 mg/m2 | Freq: Once | INTRAVENOUS | Status: AC
  Administered 2022-09-10: 1250 mg via INTRAVENOUS
  Filled 2022-09-10: qty 25

## 2022-09-10 MED ORDER — METHOTREXATE SODIUM CHEMO INJECTION (PF) 50 MG/2ML
40.0000 mg/m2 | Freq: Once | INTRAMUSCULAR | Status: AC
  Administered 2022-09-10: 84.5 mg via INTRAVENOUS
  Filled 2022-09-10: qty 3.38

## 2022-09-10 NOTE — Progress Notes (Signed)
[facility] Follow up:    [name], [place] [address]   DIAGNOSIS:  Cancer Staging  Malignant neoplasm of overlapping sites of left breast in female, estrogen receptor positive ([place]) Staging form: Breast, AJCC 8th Edition - Clinical: Stage IB (cT2, cN0, cM0, G2, ER+, PR+, HER2-) - Signed by Nicholas Lose, MD on 05/26/2022 Histologic grading system: 3 grade system  Malignant neoplasm of upper-outer quadrant of right breast in female, estrogen receptor negative ([place]) Staging form: Breast, AJCC 8th Edition - Clinical stage from 05/26/2022: Stage IIB (cT2, cN0, cM0, G2, ER-, PR-, HER2-) - Unsigned Stage prefix: Initial diagnosis Histologic grading system: 3 grade system   SUMMARY OF ONCOLOGIC HISTORY: Oncology History  Malignant neoplasm of upper-outer quadrant of right breast in female, estrogen receptor negative ([place])  05/14/2022 Initial Diagnosis   Screening mammogram detected bilateral breast abnormalities. 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%, PR 0%, HER2 negative, Ki-67 less than 1%), 1.4 cm mass (not biopsied) axilla negative Right breast: Calcifications 3 cm biopsy: Grade 2 IDC with DCIS ER 0%, PR 0%, HER2 negative, Ki-67 0%, axilla negative, additional distortion to be biopsied today   06/02/2022 Genetic Testing   [facility] CancerNext-Expanded Panel+RNA was Negative. Report date is 06/03/2022.  The CancerNext-Expanded gene panel offered by [facility] and includes sequencing, rearrangement, and RNA analysis for the following 77 genes: AIP, ALK, APC, ATM, AXIN2, BAP1, BARD1, BLM, BMPR1A, BRCA1, BRCA2, BRIP1, CDC73, CDH1, CDK4, CDKN1B, CDKN2A, CHEK2, CTNNA1, DICER1, FANCC, FH, FLCN, GALNT12, KIF1B, LZTR1, MAX, MEN1, MET, MLH1, MSH2, MSH3,  MSH6, MUTYH, NBN, NF1, NF2, NTHL1, PALB2, PHOX2B, PMS2, POT1, PRKAR1A, PTCH1, PTEN, RAD51C, RAD51D, RB1, RECQL, RET, SDHA, SDHAF2, SDHB, SDHC, SDHD, SMAD4, SMARCA4, SMARCB1, SMARCE1, STK11, SUFU, TMEM127, TP53, TSC1, TSC2, VHL and XRCC2 (sequencing and deletion/duplication); EGFR, EGLN1, HOXB13, KIT, MITF, PDGFRA, POLD1, and POLE (sequencing only); EPCAM and GREM1 (deletion/duplication only).     07/19/2022 Surgery   Bilateral mastectomies:  Left mastectomy: Grade 2 IDC 6.8 cm with intermediate grade DCIS, margins negative, ER 100%, PR 5%, HER2 negative (2+ IHC, FISH neg), Ki-67 less than 1% 0/5 lymph nodes;  Right mastectomy: Grade 2 IDC 8.5 cm and 1.5 cm with high-grade DCIS, margins negative, ER 95% (on biopsy it was read as negative), PR 0%, HER2 negative, Ki-67 15% 0/3 lymph nodes   08/20/2022 -  Chemotherapy   Patient is on Treatment Plan : BREAST Adjuvant CMF IV q21d     Malignant neoplasm of overlapping sites of left breast in female, estrogen receptor positive ([place])  05/24/2022 Initial Diagnosis   Malignant neoplasm of overlapping sites of left breast in female, estrogen receptor positive ([place])   05/26/2022 Cancer Staging   Staging form: Breast, AJCC 8th Edition - Clinical: Stage IB (cT2, cN0, cM0, G2, ER+, PR+, HER2-) - Signed by Nicholas Lose, MD on 05/26/2022 Histologic grading system: 3 grade system   06/02/2022 Genetic Testing   [facility] CancerNext-Expanded Panel+RNA was Negative. Report date is 06/03/2022.  The CancerNext-Expanded gene panel offered by [facility] and includes sequencing, rearrangement, and RNA analysis for the following 77 genes: AIP, ALK, APC, ATM, AXIN2, BAP1, BARD1, BLM, BMPR1A, BRCA1, BRCA2, BRIP1, CDC73, CDH1, CDK4, CDKN1B, CDKN2A, CHEK2, CTNNA1, DICER1, FANCC, FH, FLCN, GALNT12, KIF1B, LZTR1, MAX, MEN1, MET, MLH1, MSH2, MSH3, MSH6,  MUTYH, NBN, NF1, NF2, NTHL1, PALB2, PHOX2B, PMS2, POT1, PRKAR1A, PTCH1, PTEN, RAD51C, RAD51D, RB1, RECQL, RET, SDHA, SDHAF2,  SDHB, SDHC, SDHD, SMAD4, SMARCA4, SMARCB1, SMARCE1, STK11, SUFU, TMEM127, TP53, TSC1, TSC2, VHL and XRCC2 (sequencing and deletion/duplication); EGFR, EGLN1, HOXB13, KIT, MITF, PDGFRA, POLD1, and POLE (sequencing only); EPCAM and GREM1 (deletion/duplication only).     08/20/2022 -  Chemotherapy   Patient is on Treatment Plan : BREAST Adjuvant CMF IV q21d       CURRENT THERAPY: Adjuvant CMF  INTERVAL HISTORY: [name] 62 y.o. female returns for a follow up before Cycle 2 of adjuvant CMF. She reports having fatigue after treatment lasting several days. She is able to complete her ADLs on her own. She reports decreased appetite with a 2 lb weight loss since 08/20/2022. She reports post prandial nausea that has improved with taking compazine before meals. She denies vomiting episodes. Her bowel habits are unchanged with daily bowel movements. Her stools are soft without any diarrhea. She denies easy bruising or signs of bleeding. She denies fevers, chills, sweats, shortness of breath, chest pain, cough, peripheral edema or neuropathy. She has no other complaints.    Patient Active Problem List   Diagnosis Date Noted   Port-A-Cath in place 08/27/2022   Type 2 diabetes mellitus with hyperglycemia ([place]) 08/27/2022   Vitamin D deficiency 08/27/2022   Pure hypercholesterolemia 08/27/2022   Bilateral breast cancer ([name]) 07/19/2022   Atypical pneumonia 06/19/2022   Acute hypoxemic respiratory failure ([place]) 06/19/2022   Severe sepsis ([place]) 06/19/2022   AKI (acute kidney injury) ([place]) 06/19/2022   Acute pyelonephritis [id number]   Acute metabolic encephalopathy [id number]   Genetic testing 06/03/2022   Malignant neoplasm of upper-outer quadrant of right breast in female, estrogen receptor negative ([place]) 05/24/2022   Malignant neoplasm of overlapping sites of left breast in female, estrogen receptor positive ([place]) 05/24/2022   Acute respiratory failure due to COVID-19 [facility]) 07/13/2020   Acute  encephalopathy 07/13/2020   Renal insufficiency 07/13/2020   Neuritis 04/23/2015   Neuralgia 04/23/2015   Neuropathy 04/23/2015   Hyperalgesia 04/23/2015   Allodynia 04/23/2015   Obese 08/02/2012   Anxiety 06/07/2011   Hyperlipidemia    NECK PAIN 04/20/2010   Essential hypertension 11/29/2008   TOBACCO USE 09/30/2007   SYNDROME, CHRONIC PAIN 03/24/2007   MENOPAUSAL DISORDER 03/24/2007   GERD 01/04/2007   CHOLELITHIASIS 01/04/2007    is allergic to codeine phosphate, lipitor [atorvastatin], simvastatin, sulfamethoxazole, and tape.  MEDICAL HISTORY: Past Medical History:  Diagnosis Date   Anemia    Arthritis    Asthma    Bladder spasms    Breast cancer ([place])    Diabetes mellitus without complication ([place])    GERD (gastroesophageal reflux disease)    Heart murmur    Hyperlipidemia    Hypertension     SURGICAL HISTORY: Past Surgical History:  Procedure Laterality Date   [place], 1990, 2006   BREAST BIOPSY Left 05/14/2022   [place] LT BREAST BX W LOC DEV 1ST LESION IMG BX SPEC US GUIDE 05/14/2022 [facility]   BREAST BIOPSY Right 05/14/2022   MM RT BREAST BX W LOC DEV 1ST LESION IMAGE BX SPEC STEREO GUIDE 05/14/2022 [facility]   BREAST BIOPSY Left 05/14/2022   MM LT BREAST BX W LOC DEV 1ST LESION IMAGE BX SPEC STEREO GUIDE 05/14/2022 [facility]   BREAST BIOPSY Right 05/26/2022   MM RT BREAST BX W LOC DEV 1ST LESION IMAGE BX SPEC STEREO GUIDE 05/26/2022  [facility]   BREAST BIOPSY Right 05/26/2022   MM RT BREAST BX W LOC DEV EA AD LESION IMG BX SPEC STEREO GUIDE 05/26/2022 [facility]   BREAST LUMPECTOMY  06/14/1974   bi-lat , both benign    CHOLECYSTECTOMY  06/14/1996   LEG SURGERY  06/15/2007   work related injury   PARTIAL HYSTERECTOMY     still has ovaries   PORTACATH PLACEMENT Right 08/12/2022   Procedure: INSERTION PORT-A-CATH;  Surgeon: Jovita Kussmaul, MD;  Location: [facility];  Service: General;  Laterality: Right;    SENTINEL NODE BIOPSY Bilateral 07/19/2022   Procedure: BILATERAL SENTINEL NODE BIOPSY;  Surgeon: Jovita Kussmaul, MD;  Location: [facility];  Service: General;  Laterality: Bilateral;   TOTAL MASTECTOMY Bilateral 07/19/2022   Procedure: BILATERAL TOTAL MASTECTOMY;  Surgeon: Jovita Kussmaul, MD;  Location: [facility];  Service: General;  Laterality: Bilateral;    SOCIAL HISTORY: Social History   Socioeconomic History   Marital status: Married    Spouse name: Ricky    Number of children: 2   Years of education: 9   Highest education level: Not on file  Occupational History   Occupation: Pensions consultant  Tobacco Use   Smoking status: Former    Packs/day: 0.50    Years: 37.00    Additional pack years: 0.00    Total pack years: 18.50    Types: Cigarettes    Quit date: 06/14/2013    Years since quitting: 9.2   Smokeless tobacco: Never  Vaping Use   Vaping Use: Never used  Substance and Sexual Activity   Alcohol use: No   Drug use: No   Sexual activity: Never    Comment: 62 YEARS OLD, NO MORE THAN 5 PARTNERS  Other Topics Concern   Not on file  Social History Narrative   Lives at home with husband   Caffeine use: none   Social Determinants of Health   Financial Resource Strain: Not on file  Food Insecurity: No Food Insecurity (06/19/2022)   Hunger Vital Sign    Worried About Running Out of Food in the Last Year: Never true    [place] in the Last Year: Never true  Transportation Needs: No Transportation Needs (09/06/2022)   PRAPARE - Hydrologist (Medical): No    Lack of Transportation (Non-Medical): No  Physical Activity: Not on file  Stress: Not on file  Social Connections: Not on file  Intimate Partner Violence: Not on file    FAMILY HISTORY: Family History  Problem Relation Age of Onset   Diabetes Mother    Hypertension Mother    Heart disease Mother    Hypertension Father    Diabetes Father    Heart disease Father    Breast cancer Sister 50    Lymphoma Paternal Grandfather        dx after 25   Neuropathy Neg Hx     Review of Systems  Constitutional:  Positive for appetite change and fatigue. Negative for chills, fever and unexpected weight change.  HENT:   Negative for hearing loss, lump/mass and trouble swallowing.   Eyes:  Negative for eye problems and icterus.  Respiratory:  Negative for chest tightness, cough and shortness of breath.   Cardiovascular:  Negative for chest pain, leg swelling and palpitations.  Gastrointestinal:  Positive for nausea. Negative for abdominal distention, abdominal pain, constipation, diarrhea and vomiting.  Endocrine: Negative for hot flashes.  Genitourinary:  Negative  for difficulty urinating.   Musculoskeletal:  Negative for arthralgias.  Skin:  Negative for itching and rash.  Neurological:  Negative for dizziness, extremity weakness, headaches and numbness.  Hematological:  Negative for adenopathy. Does not bruise/bleed easily.  Psychiatric/Behavioral:  Negative for depression. The patient is not nervous/anxious.       PHYSICAL EXAMINATION Constitutional: Oriented to person, place, and time and well-developed, well-nourished, and in no distress.  HENT:  Head: Normocephalic and atraumatic.  Eyes: Conjunctivae are normal. Right eye exhibits no discharge. Left eye exhibits no discharge. No scleral icterus.  Neck: Normal range of motion. Neck supple.   Cardiovascular: Normal rate, regular rhythm, normal heart sounds and intact distal pulses.   Pulmonary/Chest: Effort normal and breath sounds normal. No respiratory distress. No wheezes. No rales.  Musculoskeletal: Normal range of motion. Exhibits no edema.  Lymphadenopathy: No cervical adenopathy.  Neurological: Alert and oriented to person, place, and time. Exhibits normal muscle tone. Gait normal. Coordination normal.  Skin: Skin is warm and dry. No rash noted. Not diaphoretic. No erythema. No pallor.  Psychiatric: Mood, memory and  judgment normal.   LABORATORY DATA:  CBC    Component Value Date/Time   WBC 5.0 09/10/2022 0732   WBC 6.0 08/11/2022 0851   RBC 4.21 09/10/2022 0732   HGB 12.2 09/10/2022 0732   HCT 37.0 09/10/2022 0732   PLT 282 09/10/2022 0732   MCV 87.9 09/10/2022 0732   MCH 29.0 09/10/2022 0732   MCHC 33.0 09/10/2022 0732   RDW 14.7 09/10/2022 0732   LYMPHSABS 0.8 09/10/2022 0732   MONOABS 0.6 09/10/2022 0732   EOSABS 0.1 09/10/2022 0732   BASOSABS 0.1 09/10/2022 0732    CMP     Component Value Date/Time   NA 135 09/10/2022 0732   K 4.2 09/10/2022 0732   CL 99 09/10/2022 0732   CO2 27 09/10/2022 0732   GLUCOSE 175 (H) 09/10/2022 0732   GLUCOSE 109 (H) 06/10/2006 1007   BUN 20 09/10/2022 0732   CREATININE 1.41 (H) 09/10/2022 0732   CALCIUM 8.9 09/10/2022 0732   PROT 6.8 09/10/2022 0732   ALBUMIN 3.6 09/10/2022 0732   AST 21 09/10/2022 0732   ALT 24 09/10/2022 0732   ALKPHOS 62 09/10/2022 0732   BILITOT 0.4 09/10/2022 0732   GFRNONAA 42 (L) 09/10/2022 0732   GFRAA  08/11/2010 0505    >60        The eGFR has been calculated using the MDRD equation. This calculation has not been validated in all clinical situations. eGFR's persistently <60 mL/min signify possible Chronic Kidney Disease.      ASSESSMENT and THERAPY PLAN:  BLESSEN CARVAJAL is a 62 y.o. female who presents to the clinic for a follow up for breast cancer.   #Left-sided stage IIb triple negative breast cancer and right-sided stage Ib ER/PR positive breast cancer  -Underwent screening mammogram detected bilateral breast abnormalities. A. 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%, PR 0%, HER2 negative, Ki-67 less than 1%), 1.4 cm mass (not biopsied) axilla negative B. Right breast: Calcifications 3 cm biopsy: Grade 2 IDC with DCIS ER 0%, PR 0%,  HER2 negative, Ki-67 0%, axilla negative, additional distortion to be biopsied today  -Underwent bilateral mastectomies on 07/19/2022. Pathology revealed: A. Left mastectomy: Grade 2 IDC 6.8 cm with intermediate grade DCIS,  margins negative, ER 100%, PR 5%, HER2 negative (2+ IHC, FISH neg), Ki-67 less than 1% 0/5 lymph nodes;  B. Right mastectomy: Grade 2 IDC 8.5 cm and 1.5 cm with high-grade DCIS, margins negative, ER 95% (on biopsy it was read as negative), PR 0%, HER2 negative, Ki-67 15% 0/3 lymph nodes  -Recommended adjuvant CMF chemotherapy x 6 cycles (selected because of her PS), postmastectomy radiation followed by antiestrogen therapy.   -Started Cycle 1 of CMF chemotherapy on 08/20/2022.  PLAN: -Due for Cycle 2 of CMF chemotherapy -Labs from today reviewed and adequate for treatment. No cytopenias. Creatinine level increased to 1.41, levels oscillate. LFTs normal -Proceed with treatment today without any dose modifications -Due to increased creatinine levels and mild hypotension, we will give 1 L of IV fluids today with treatment. Encouraged 1-2 L of fluids per day -Encouraged to eat small, frequent meals and supplement with protein shakes to maintain her weight. Advised to monitor glucose intake as levels have been very high in the past.  -Patient's surgical drains are in place, draining 40-60 cc per day. Patient plans to follow up with surgeon as she would like additional supplies including new bulbs.  -RTC on 10/01/2022 for labs, follow up visit wit [name] before Cycle 3 of CMF.   Ms. Bandt expressed understanding and satisfaction with the plan provided.   I have spent a total of 30 minutes minutes of face-to-face and non-face-to-face time, preparing to see the patient, performing a medically appropriate examination, counseling and educating the patient,  documenting clinical information in the electronic health record, and care coordination.   Dede Query PA-C Dept of Hematology  and [place] at [facility] Phone: [phone]

## 2022-09-13 ENCOUNTER — Telehealth: Payer: Self-pay | Admitting: Hematology and Oncology

## 2022-09-13 ENCOUNTER — Other Ambulatory Visit: Payer: Self-pay

## 2022-09-13 ENCOUNTER — Encounter: Payer: Self-pay | Admitting: Hematology and Oncology

## 2022-09-13 NOTE — Telephone Encounter (Signed)
Scheduled appointments per WQ. Patient is aware of the made appointments.  

## 2022-09-14 ENCOUNTER — Other Ambulatory Visit: Payer: Self-pay

## 2022-09-16 ENCOUNTER — Other Ambulatory Visit: Payer: Self-pay | Admitting: *Deleted

## 2022-09-16 DIAGNOSIS — C50912 Malignant neoplasm of unspecified site of left female breast: Secondary | ICD-10-CM | POA: Diagnosis not present

## 2022-09-16 DIAGNOSIS — C50911 Malignant neoplasm of unspecified site of right female breast: Secondary | ICD-10-CM | POA: Diagnosis not present

## 2022-09-16 MED ORDER — ACYCLOVIR 400 MG PO TABS: 400.0000 mg | ORAL_TABLET | Freq: Two times a day (BID) | ORAL | 0 refills | Status: DC

## 2022-09-16 MED ORDER — MAGIC MOUTHWASH W/LIDOCAINE: 5.0000 mL | Freq: Four times a day (QID) | ORAL | 1 refills | Status: DC | PRN

## 2022-09-16 NOTE — Progress Notes (Signed)
Received call from pt with complaint of fever blisters on the outside of her mouth and becoming more severe with OTC treatment.  Verbal orders received from MD for pt start Acyclovir 400 mg BID x 1 week as well as Magic Mouthwash.  Prescription sent to pharmacy on file, pt educated and verbalized understanding.

## 2022-09-21 DIAGNOSIS — C50911 Malignant neoplasm of unspecified site of right female breast: Secondary | ICD-10-CM | POA: Diagnosis not present

## 2022-09-21 DIAGNOSIS — C50912 Malignant neoplasm of unspecified site of left female breast: Secondary | ICD-10-CM | POA: Diagnosis not present

## 2022-09-22 ENCOUNTER — Other Ambulatory Visit: Payer: Self-pay | Admitting: Hematology and Oncology

## 2022-09-22 DIAGNOSIS — C50812 Malignant neoplasm of overlapping sites of left female breast: Secondary | ICD-10-CM

## 2022-09-22 DIAGNOSIS — C50411 Malignant neoplasm of upper-outer quadrant of right female breast: Secondary | ICD-10-CM

## 2022-09-23 ENCOUNTER — Telehealth: Payer: Self-pay | Admitting: *Deleted

## 2022-09-23 NOTE — Telephone Encounter (Signed)
   Telephone encounter was:  Unsuccessful.  09/23/2022 Name: TAYSIA BAJWA MRN: [id number] DOB: Apr 13, 1961  Unsuccessful outbound call made today to assist with:  Food Insecurity  Outreach Attempt:  1st Attempt  A HIPAA compliant voice message was left requesting a return call.  Instructed patient to call back at [phone].  Yehuda Mao Greenauer -[facility] [place], Population Health [phone] [address] [place] , [place] [address] Email : [email or web address]

## 2022-09-24 ENCOUNTER — Telehealth: Payer: Self-pay | Admitting: *Deleted

## 2022-09-24 NOTE — Telephone Encounter (Signed)
   Telephone encounter was:  Successful.  09/24/2022 Name: [name] MRN: [id number] DOB: October 04, 1960  [name] is a 62 y.o. year old female who is a primary care patient of Fatima Sanger, FNP . The community resource team was consulted for assistance with Food Insecurity Patient provided 6 more weeks of moms meals to help regulate her bs during her chemo  Care guide performed the following interventions: Patient provided with information about care guide support team and interviewed to confirm resource needs.  Follow Up Plan:  No further follow up planned at this time. The patient has been provided with needed resources.  Marland Kitchenj

## 2022-09-27 ENCOUNTER — Ambulatory Visit: Payer: Self-pay

## 2022-09-27 DIAGNOSIS — C50911 Malignant neoplasm of unspecified site of right female breast: Secondary | ICD-10-CM | POA: Diagnosis not present

## 2022-09-27 DIAGNOSIS — C50912 Malignant neoplasm of unspecified site of left female breast: Secondary | ICD-10-CM | POA: Diagnosis not present

## 2022-09-27 NOTE — Patient Instructions (Signed)
Visit Information  Thank you for taking time to visit with me today. Please don't hesitate to contact me if I can be of assistance to you.   Following are the goals we discussed today:   Goals Addressed             This Visit's Progress    Beating Breast Cancer       Patient Goals/Self Care Activities: -Patient/Caregiver will self-administer medications as prescribed as evidenced by self-report/primary caregiver report  -Patient/Caregiver will attend all scheduled provider appointments as evidenced by clinician review of documented attendance to scheduled appointments and patient/caregiver report -Patient/Caregiver will call provider office for new concerns or questions as evidenced by review of documented incoming telephone call notes and patient report  -check blood sugar at prescribed times -record values and write them down take them to all doctor visits   -Patient in current treatment for breast cancer- still has drain tubes in.  No signs of infection. Chemo every 3rd Friday-no problems with transportation.   Blood sugars up and down depending on what she eats. Patient to try MOM's meals.  Interventions Today    Flowsheet Row Most Recent Value  Chronic Disease   Chronic disease during today's visit --  [Breast cancer]  General Interventions   General Interventions Discussed/Reviewed General Interventions Reviewed, Doctor Visits  Doctor Visits Discussed/Reviewed Specialist  Education Interventions   Education Provided Provided Education  Provided Verbal Education On When to see the doctor  [Wound care and signs and infection]  Nutrition Interventions   Nutrition Discussed/Reviewed Nutrition Reviewed, Decreasing sugar intake                Our next appointment is by telephone on 10/13/22 at 1000 am  Please call the care guide team at [phone] if you need to cancel or reschedule your appointment.   If you are experiencing a Mental Health or Behavioral Health  Crisis or need someone to talk to, please call the Suicide and Crisis Lifeline: 988   The patient verbalized understanding of instructions, educational materials, and care plan provided today and DECLINED offer to receive copy of patient instructions, educational materials, and care plan.   The patient has been provided with contact information for the care management team and has been advised to call with any health related questions or concerns.   Bary Leriche, RN, MSN [facility] Care Management Care Management Coordinator Direct Line [phone]

## 2022-09-27 NOTE — Patient Outreach (Addendum)
  Care Coordination   Follow Up Visit Note   09/27/2022 Name: [name] MRN: [id number] DOB: 1961/04/11  [name] is a 62 y.o. year old female who sees [name], Jessica Lambert, FNP for primary care. I spoke with  [name] by phone today. Patient doing good.  Continues treatment.  Taste has been off and on, continues to try to eat.  Blood sugar management continues. Still drains.  Discussed signs and symptoms of infection.    What matters to the patients health and wellness today?  Beating cancer    Goals Addressed             This Visit's Progress    Beating Breast Cancer       Patient Goals/Self Care Activities: -Patient/Caregiver will self-administer medications as prescribed as evidenced by self-report/primary caregiver report  -Patient/Caregiver will attend all scheduled provider appointments as evidenced by clinician review of documented attendance to scheduled appointments and patient/caregiver report -Patient/Caregiver will call provider office for new concerns or questions as evidenced by review of documented incoming telephone call notes and patient report  -check blood sugar at prescribed times -record values and write them down take them to all doctor visits   -Patient in current treatment for breast cancer- still has drain tubes in.  No signs of infection. Chemo every 3rd Friday-no problems with transportation.   Blood sugars up and down depending on what she eats. Patient to try MOM's meals.  Interventions Today    Flowsheet Row Most Recent Value  Chronic Disease   Chronic disease during today's visit --  [Breast cancer]  General Interventions   General Interventions Discussed/Reviewed General Interventions Reviewed, Doctor Visits  Doctor Visits Discussed/Reviewed Specialist  Education Interventions   Education Provided Provided Education  Provided Verbal Education On When to see the doctor  [Wound care and signs and infection]  Nutrition  Interventions   Nutrition Discussed/Reviewed Nutrition Reviewed, Decreasing sugar intake                SDOH assessments and interventions completed:  Yes     Care Coordination Interventions:  Yes, provided   Follow up plan: Follow up call scheduled for May    Encounter Outcome:  Pt. Visit Completed   Bary Leriche, RN, MSN [facility] Care Management Care Management Coordinator Direct Line [phone]

## 2022-09-28 DIAGNOSIS — Z79891 Long term (current) use of opiate analgesic: Secondary | ICD-10-CM | POA: Diagnosis not present

## 2022-09-28 DIAGNOSIS — M150 Primary generalized (osteo)arthritis: Secondary | ICD-10-CM | POA: Diagnosis not present

## 2022-09-28 DIAGNOSIS — M961 Postlaminectomy syndrome, not elsewhere classified: Secondary | ICD-10-CM | POA: Diagnosis not present

## 2022-09-28 DIAGNOSIS — G894 Chronic pain syndrome: Secondary | ICD-10-CM | POA: Diagnosis not present

## 2022-09-28 NOTE — Progress Notes (Addendum)
Patient Care Team: Fatima Sanger, FNP as PCP - General (Internal Medicine) Donnelly Angelica, RN as Oncology Nurse Navigator Pershing Proud, RN as Oncology Nurse Navigator Serena Croissant, MD as Consulting Physician (Hematology and Oncology) Griselda Miner, MD as Consulting Physician (General Surgery) Lonie Peak, MD as Attending Physician (Radiation Oncology) Fleeta Emmer, RN as [facility] Care Management  DIAGNOSIS:  Encounter Diagnoses  Name Primary?   Malignant neoplasm of upper-outer quadrant of right breast in female, estrogen receptor negative Yes   Malignant neoplasm of overlapping sites of left breast in female, estrogen receptor positive     SUMMARY OF ONCOLOGIC HISTORY: Oncology History  Malignant neoplasm of upper-outer quadrant of right breast in female, estrogen receptor negative  05/14/2022 Initial Diagnosis   Screening mammogram detected bilateral breast abnormalities. 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%, PR 0%, HER2 negative, Ki-67 less than 1%), 1.4 cm mass (not biopsied) axilla negative Right breast: Calcifications 3 cm biopsy: Grade 2 IDC with DCIS ER 0%, PR 0%, HER2 negative, Ki-67 0%, axilla negative, additional distortion to be biopsied today   06/02/2022 Genetic Testing   [facility] CancerNext-Expanded Panel+RNA was Negative. Report date is 06/03/2022.  The CancerNext-Expanded gene panel offered by [facility] and includes sequencing, rearrangement, and RNA analysis for the following 77 genes: AIP, ALK, APC, ATM, AXIN2, BAP1, BARD1, BLM, BMPR1A, BRCA1, BRCA2, BRIP1, CDC73, CDH1, CDK4, CDKN1B, CDKN2A, CHEK2, CTNNA1, DICER1, FANCC, FH, FLCN, GALNT12, KIF1B, LZTR1, MAX, MEN1, MET, MLH1, MSH2, MSH3, MSH6, MUTYH, NBN, NF1, NF2, NTHL1, PALB2, PHOX2B, PMS2, POT1, PRKAR1A, PTCH1,  PTEN, RAD51C, RAD51D, RB1, RECQL, RET, SDHA, SDHAF2, SDHB, SDHC, SDHD, SMAD4, SMARCA4, SMARCB1, SMARCE1, STK11, SUFU, TMEM127, TP53, TSC1, TSC2, VHL and XRCC2 (sequencing and deletion/duplication); EGFR, EGLN1, HOXB13, KIT, MITF, PDGFRA, POLD1, and POLE (sequencing only); EPCAM and GREM1 (deletion/duplication only).     07/19/2022 Surgery   Bilateral mastectomies:  Left mastectomy: Grade 2 IDC 6.8 cm with intermediate grade DCIS, margins negative, ER 100%, PR 5%, HER2 negative (2+ IHC, FISH neg), Ki-67 less than 1% 0/5 lymph nodes;  Right mastectomy: Grade 2 IDC 8.5 cm and 1.5 cm with high-grade DCIS, margins negative, ER 95% (on biopsy it was read as negative), PR 0%, HER2 negative, Ki-67 15% 0/3 lymph nodes   08/20/2022 -  Chemotherapy   Patient is on Treatment Plan : BREAST Adjuvant CMF IV q21d     Malignant neoplasm of overlapping sites of left breast in female, estrogen receptor positive  05/24/2022 Initial Diagnosis   Malignant neoplasm of overlapping sites of left breast in female, estrogen receptor positive (HCC)   05/26/2022 Cancer Staging   Staging form: Breast, AJCC 8th Edition - Clinical: Stage IB (cT2, cN0, cM0, G2, ER+, PR+, HER2-) - Signed by Serena Croissant, MD on 05/26/2022 Histologic grading system: 3 grade system   06/02/2022 Genetic Testing   [facility] CancerNext-Expanded Panel+RNA was Negative. Report date is 06/03/2022.  The CancerNext-Expanded gene panel offered by [facility] and includes sequencing, rearrangement, and RNA analysis for the following 77 genes: AIP, ALK, APC, ATM, AXIN2, BAP1, BARD1, BLM, BMPR1A, BRCA1, BRCA2, BRIP1, CDC73, CDH1, CDK4, CDKN1B, CDKN2A, CHEK2, CTNNA1, DICER1, FANCC, FH, FLCN, GALNT12, KIF1B, LZTR1, MAX, MEN1, MET, MLH1, MSH2, MSH3, MSH6, MUTYH, NBN, NF1, NF2, NTHL1, PALB2, PHOX2B, PMS2, POT1, PRKAR1A, PTCH1, PTEN, RAD51C, RAD51D, RB1, RECQL, RET,  SDHA, SDHAF2, SDHB, SDHC, SDHD, SMAD4, SMARCA4, SMARCB1, SMARCE1, STK11, SUFU, TMEM127, TP53, TSC1,  TSC2, VHL and XRCC2 (sequencing and deletion/duplication); EGFR, EGLN1, HOXB13, KIT, MITF, PDGFRA, POLD1, and POLE (sequencing only); EPCAM and GREM1 (deletion/duplication only).     08/20/2022 -  Chemotherapy   Patient is on Treatment Plan : BREAST Adjuvant CMF IV q21d       CHIEF COMPLIANT: Cycle 3 CMF   INTERVAL HISTORY: [name] is a 62 y.o. female is here because of recent diagnosis of bilateral breast cancers. She presents to the clinic for a follow-up. She reports that her mouth broke out. She can not eat she says she feels like she is starving. She says things taste better going down but not coming up. Denies any vomiting. After chemo on Friday's she put the wash cloth on head and lye down to prevent from vomiting. The magic mouth wash helped the outside of mouth but not inside. She states that she is more fatigue.   ALLERGIES:  is allergic to codeine phosphate, lipitor [atorvastatin], simvastatin, sulfamethoxazole, and tape.  MEDICATIONS:  Current Outpatient Medications  Medication Sig Dispense Refill   acyclovir (ZOVIRAX) 400 MG tablet TAKE 1 TABLET BY MOUTH TWICE A DAY 14 tablet 0   albuterol (VENTOLIN HFA) 108 (90 Base) MCG/ACT inhaler Inhale 2 puffs into the lungs every 6 (six) hours as needed for wheezing or shortness of breath. 6.7 g 0   ALPRAZolam (XANAX) 0.5 MG tablet Take 0.5 mg by mouth 2 (two) times daily.  2   amitriptyline (ELAVIL) 50 MG tablet Take 50 mg by mouth at bedtime.     amLODipine (NORVASC) 10 MG tablet Take 10 mg by mouth daily.     Bempedoic Acid-Ezetimibe (NEXLIZET) 180-10 MG TABS Take 1 tablet by mouth daily. (Patient not taking: Reported on 09/10/2022)     ezetimibe (ZETIA) 10 MG tablet Take 10 mg by mouth daily.     hydrochlorothiazide (HYDRODIURIL) 12.5 MG tablet Take 12.5 mg by mouth daily.     lidocaine-prilocaine (EMLA) cream Apply to affected area once 30 g 3   loratadine (CLARITIN) 10 MG tablet Take 10 mg by mouth daily.     magic mouthwash  w/lidocaine SOLN Take 5 mLs by mouth 4 (four) times daily as needed for mouth pain. 240 mL 1   metFORMIN (GLUCOPHAGE-XR) 500 MG 24 hr tablet Take 1,000 mg by mouth 2 (two) times daily.     olmesartan (BENICAR) 20 MG tablet Take 1 tablet (20 mg total) by mouth daily. (Patient taking differently: Take 40 mg by mouth daily.)     ondansetron (ZOFRAN) 8 MG tablet TAKE 1 TABLET EVERY 8HRS AS NEEDED FOR NAUSEA OR VOMITING. START ON THE 3RD DAY AFTER CHEMO 30 tablet 1   oxyCODONE ER (XTAMPZA ER) 36 MG C12A Take 36 mg by mouth in the morning and at bedtime.     pantoprazole (PROTONIX) 40 MG tablet Take 40 mg by mouth daily. (Patient not taking: Reported on 09/06/2022)     Pediatric Multivitamins-Iron (FLINTSTONES PLUS EXTRA IRON PO) Take by mouth. (Patient not taking: Reported on 09/06/2022)     pregabalin (LYRICA) 100 MG capsule Take 100 mg by mouth 2 (two) times daily.     prochlorperazine (COMPAZINE) 10 MG tablet TAKE 1 TABLET BY MOUTH EVERY 6 HOURS AS NEEDED FOR NAUSEA OR VOMITING. 30 tablet 1   promethazine (PHENERGAN) 25 MG tablet TAKE 1 TABLET BY MOUTH EVERY 6 HOURS AS NEEDED FOR NAUSEA OR VOMITING. 30  tablet 0   traMADol (ULTRAM) 50 MG tablet Take 1 tablet (50 mg total) by mouth every 6 (six) hours as needed. 10 tablet 0   No current facility-administered medications for this visit.    PHYSICAL EXAMINATION: ECOG PERFORMANCE STATUS: 1 - Symptomatic but completely ambulatory  Vitals:   10/01/22 0832  BP: 112/71  Pulse: (!) 105  Resp: 18  Temp: 97.7 F (36.5 C)  SpO2: 94%   Filed Weights   10/01/22 0832  Weight: 210 lb 6.4 oz (95.4 kg)      LABORATORY DATA:  I have reviewed the data as listed    Latest Ref Rng & Units 10/01/2022    8:14 AM 09/10/2022    7:32 AM 08/27/2022    7:53 AM  CMP  Glucose 70 - 99 mg/dL 161  096  045   BUN 8 - 23 mg/dL Creatinine 0.44 - 1.00 mg/dL 4.09  8.11  9.14   Sodium 135 - 145 mmol/L 138  135  133   Potassium 3.5 - 5.1 mmol/L 4.4  4.2   4.4   Chloride 98 - 111 mmol/L 104  99  97   CO2 22 - 32 mmol/L Calcium 8.9 - 10.3 mg/dL 9.2  8.9  9.1   Total Protein 6.5 - 8.1 g/dL 6.9  6.8  6.8   Total Bilirubin 0.3 - 1.2 mg/dL 0.3  0.4  0.6   Alkaline Phos 38 - 126 U/L 60  62  56   AST 15 - 41 U/L 21  21  50   ALT 0 - 44 U/L Lab Results  Component Value Date   WBC 5.2 10/01/2022   HGB 11.1 (L) 10/01/2022   HCT 35.5 (L) 10/01/2022   MCV 89.2 10/01/2022   PLT 382 10/01/2022   NEUTROABS PENDING 10/01/2022    ASSESSMENT & PLAN:  Malignant neoplasm of upper-outer quadrant of right breast in female, estrogen receptor negative (HCC) Bilateral breast cancers Screening mammogram detected bilateral breast abnormalities. 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%, PR 0%, HER2 negative, Ki-67 less than 1%), 1.4 cm mass (not biopsied) axilla negative Right breast: Calcifications 3 cm biopsy: Grade 2 IDC with DCIS ER 0%, PR 0%, HER2 negative, Ki-67 0%, axilla negative, additional distortion to be biopsied today   Treatment plan: 07/19/2022: Bilateral mastectomies:  Left mastectomy: Grade 2 IDC 6.8 cm with intermediate grade DCIS, margins negative, ER 100%, PR 5%, HER2 negative (2+ IHC, FISH neg), Ki-67 less than 1% 0/5 lymph nodes;  Right mastectomy: Grade 2 IDC 8.5 cm and 1.5 cm with high-grade DCIS, margins negative, ER 95% (on biopsy it was read as negative), PR 0%, HER2 negative, Ki-67 15% 0/3 lymph nodes Adjuvant chemotherapy CMF x 6 cycles (selected because of her performance status) started 08/20/2022 Postmastectomy radiation Antiestrogen therapy   Patient needs an extremely unhealthy diet rich in cholesterol and fats and sugar.  She does not seem to have any interest in changing her  diet. ------------------------------------------------------------------------------------------------------------------------ Current treatment: Cycle 3 CMF Chemo toxicities: Mild source: Magic mouthwash has helped her Acid reflux: Instructed to go back on taking antiacid medication. Fatigue Nausea: Manageable  With elevated serum creatinine: We will add 500 mL  of normal saline with each of her treatments.  With improvement in the serum creatinine and the chronic kidney disease, her methotrexate dose has been increased to full dose. Return to clinic in 3 weeks for cycle 4    No orders of the defined types were placed in this encounter.  The patient has a good understanding of the overall plan. she agrees with it. she will call with any problems that may develop before the next visit here. Total time spent: 30 mins including face to face time and time spent for planning, charting and co-ordination of care   Tamsen Meek, MD 10/01/22    I Janan Ridge am acting as a Neurosurgeon for [employer]  I have reviewed the above documentation for accuracy and completeness, and I agree with the above.

## 2022-09-29 ENCOUNTER — Telehealth: Payer: Self-pay | Admitting: Hematology and Oncology

## 2022-09-29 DIAGNOSIS — C50911 Malignant neoplasm of unspecified site of right female breast: Secondary | ICD-10-CM | POA: Diagnosis not present

## 2022-09-29 DIAGNOSIS — C50912 Malignant neoplasm of unspecified site of left female breast: Secondary | ICD-10-CM | POA: Diagnosis not present

## 2022-09-29 NOTE — Telephone Encounter (Signed)
Patient called to verify time of appointment.

## 2022-09-30 MED FILL — Dexamethasone Sodium Phosphate Inj 100 MG/10ML: INTRAMUSCULAR | Qty: 1 | Status: AC

## 2022-10-01 ENCOUNTER — Inpatient Hospital Stay: Payer: BC Managed Care – PPO | Attending: Hematology and Oncology | Admitting: Hematology and Oncology

## 2022-10-01 ENCOUNTER — Inpatient Hospital Stay: Payer: BC Managed Care – PPO

## 2022-10-01 ENCOUNTER — Other Ambulatory Visit: Payer: Self-pay | Admitting: Hematology and Oncology

## 2022-10-01 ENCOUNTER — Ambulatory Visit: Payer: BC Managed Care – PPO

## 2022-10-01 ENCOUNTER — Other Ambulatory Visit: Payer: Self-pay

## 2022-10-01 ENCOUNTER — Other Ambulatory Visit: Payer: BC Managed Care – PPO

## 2022-10-01 ENCOUNTER — Ambulatory Visit: Payer: BC Managed Care – PPO | Admitting: Hematology and Oncology

## 2022-10-01 VITALS — HR 96 | Resp 18

## 2022-10-01 VITALS — BP 112/71 | HR 105 | Temp 97.7°F | Resp 18 | Ht 66.0 in | Wt 210.4 lb

## 2022-10-01 DIAGNOSIS — Z9013 Acquired absence of bilateral breasts and nipples: Secondary | ICD-10-CM | POA: Diagnosis not present

## 2022-10-01 DIAGNOSIS — C50812 Malignant neoplasm of overlapping sites of left female breast: Secondary | ICD-10-CM | POA: Diagnosis not present

## 2022-10-01 DIAGNOSIS — C50411 Malignant neoplasm of upper-outer quadrant of right female breast: Secondary | ICD-10-CM | POA: Insufficient documentation

## 2022-10-01 DIAGNOSIS — Z171 Estrogen receptor negative status [ER-]: Secondary | ICD-10-CM | POA: Insufficient documentation

## 2022-10-01 DIAGNOSIS — Z170 Estrogen receptor positive status [ER+]: Secondary | ICD-10-CM | POA: Diagnosis not present

## 2022-10-01 DIAGNOSIS — Z79899 Other long term (current) drug therapy: Secondary | ICD-10-CM | POA: Diagnosis not present

## 2022-10-01 DIAGNOSIS — Z5111 Encounter for antineoplastic chemotherapy: Secondary | ICD-10-CM | POA: Insufficient documentation

## 2022-10-01 DIAGNOSIS — Z95828 Presence of other vascular implants and grafts: Secondary | ICD-10-CM

## 2022-10-01 LAB — CMP (CANCER CENTER ONLY)
ALT: 15 U/L (ref 0–44)
AST: 21 U/L (ref 15–41)
Albumin: 3.7 g/dL (ref 3.5–5.0)
Alkaline Phosphatase: 60 U/L (ref 38–126)
Anion gap: 7 (ref 5–15)
BUN: 14 mg/dL (ref 8–23)
CO2: 27 mmol/L (ref 22–32)
Calcium: 9.2 mg/dL (ref 8.9–10.3)
Chloride: 104 mmol/L (ref 98–111)
Creatinine: 1.02 mg/dL — ABNORMAL HIGH (ref 0.44–1.00)
GFR, Estimated: >60 mL/min (ref >60)
Glucose, Bld: 192 mg/dL — ABNORMAL HIGH (ref 70–99)
Potassium: 4.4 mmol/L (ref 3.5–5.1)
Sodium: 138 mmol/L (ref 135–145)
Total Bilirubin: 0.3 mg/dL (ref 0.3–1.2)
Total Protein: 6.9 g/dL (ref 6.5–8.1)

## 2022-10-01 LAB — CBC WITH DIFFERENTIAL (CANCER CENTER ONLY)
Abs Immature Granulocytes: 0.36 10*3/uL — ABNORMAL HIGH (ref 0.00–0.07)
Basophils Absolute: 0.1 10*3/uL (ref 0.0–0.1)
Basophils Relative: 3 %
Eosinophils Absolute: 0.1 10*3/uL (ref 0.0–0.5)
Eosinophils Relative: 3 %
HCT: 35.5 % — ABNORMAL LOW (ref 36.0–46.0)
Hemoglobin: 11.1 g/dL — ABNORMAL LOW (ref 12.0–15.0)
Immature Granulocytes: 7 %
Lymphocytes Relative: 13 %
Lymphs Abs: 0.7 10*3/uL (ref 0.7–4.0)
MCH: 27.9 pg (ref 26.0–34.0)
MCHC: 31.3 g/dL (ref 30.0–36.0)
MCV: 89.2 fL (ref 80.0–100.0)
Monocytes Absolute: 0.7 10*3/uL (ref 0.1–1.0)
Monocytes Relative: 14 %
Neutro Abs: 3.2 10*3/uL (ref 1.7–7.7)
Neutrophils Relative %: 60 %
Platelet Count: 382 10*3/uL (ref 150–400)
RBC: 3.98 MIL/uL (ref 3.87–5.11)
RDW: 16.3 % — ABNORMAL HIGH (ref 11.5–15.5)
Smear Review: NORMAL
WBC Count: 5.2 10*3/uL (ref 4.0–10.5)
nRBC: 1.2 % — ABNORMAL HIGH (ref 0.0–0.2)

## 2022-10-01 MED ORDER — HEPARIN SOD (PORK) LOCK FLUSH 100 UNIT/ML IV SOLN
500.0000 [IU] | Freq: Once | INTRAVENOUS | Status: AC | PRN
  Administered 2022-10-01: 500 [IU]

## 2022-10-01 MED ORDER — METHOTREXATE SODIUM CHEMO INJECTION (PF) 50 MG/2ML
40.0000 mg/m2 | Freq: Once | INTRAMUSCULAR | Status: AC
  Administered 2022-10-01: 84.5 mg via INTRAVENOUS
  Filled 2022-10-01: qty 3.38

## 2022-10-01 MED ORDER — SODIUM CHLORIDE 0.9 % IV SOLN
600.0000 mg/m2 | Freq: Once | INTRAVENOUS | Status: AC
  Administered 2022-10-01: 1260 mg via INTRAVENOUS
  Filled 2022-10-01: qty 63

## 2022-10-01 MED ORDER — SODIUM CHLORIDE 0.9% FLUSH
10.0000 mL | Freq: Once | INTRAVENOUS | Status: AC
  Administered 2022-10-01: 10 mL

## 2022-10-01 MED ORDER — SODIUM CHLORIDE 0.9% FLUSH
10.0000 mL | INTRAVENOUS | Status: DC | PRN
  Administered 2022-10-01: 10 mL

## 2022-10-01 MED ORDER — PALONOSETRON HCL INJECTION 0.25 MG/5ML
0.2500 mg | Freq: Once | INTRAVENOUS | Status: AC
  Administered 2022-10-01: 0.25 mg via INTRAVENOUS
  Filled 2022-10-01: qty 5

## 2022-10-01 MED ORDER — FLUOROURACIL CHEMO INJECTION 2.5 GM/50ML
600.0000 mg/m2 | Freq: Once | INTRAVENOUS | Status: AC
  Administered 2022-10-01: 1250 mg via INTRAVENOUS
  Filled 2022-10-01: qty 25

## 2022-10-01 MED ORDER — SODIUM CHLORIDE 0.9 % IV SOLN: INTRAVENOUS | Status: AC

## 2022-10-01 MED ORDER — SODIUM CHLORIDE 0.9 % IV SOLN
10.0000 mg | Freq: Once | INTRAVENOUS | Status: AC
  Administered 2022-10-01: 10 mg via INTRAVENOUS
  Filled 2022-10-01: qty 10

## 2022-10-01 NOTE — Progress Notes (Signed)
CrCl = 84 mL/min.  Dr. Pamelia Hoit aware; agrees w/ full dose MTX.  Ebony Hail, Pharm.D., CPP 10/01/2022@9 :57 AM

## 2022-10-01 NOTE — Patient Instructions (Signed)
[place] [facility]  Discharge Instructions: Thank you for choosing [facility] to provide your oncology and hematology care.   If you have a lab appointment with the Cancer Center, please go directly to the Cancer Center and check in at the registration area.   Wear comfortable clothing and clothing appropriate for easy access to any Portacath or PICC line.   We strive to give you quality time with your provider. You may need to reschedule your appointment if you arrive late (15 or more minutes).  Arriving late affects you and other patients whose appointments are after yours.  Also, if you miss three or more appointments without notifying the office, you may be dismissed from the clinic at the provider's discretion.      For prescription refill requests, have your pharmacy contact our office and allow 72 hours for refills to be completed.    Today you received the following chemotherapy and/or immunotherapy agents: Cytoxan, Methotrexate, and Fluoroucil       To help prevent nausea and vomiting after your treatment, we encourage you to take your nausea medication as directed.  BELOW ARE SYMPTOMS THAT SHOULD BE REPORTED IMMEDIATELY: *FEVER GREATER THAN 100.4 F (38 C) OR HIGHER *CHILLS OR SWEATING *NAUSEA AND VOMITING THAT IS NOT CONTROLLED WITH YOUR NAUSEA MEDICATION *UNUSUAL SHORTNESS OF BREATH *UNUSUAL BRUISING OR BLEEDING *URINARY PROBLEMS (pain or burning when urinating, or frequent urination) *BOWEL PROBLEMS (unusual diarrhea, constipation, pain near the anus) TENDERNESS IN MOUTH AND THROAT WITH OR WITHOUT PRESENCE OF ULCERS (sore throat, sores in mouth, or a toothache) UNUSUAL RASH, SWELLING OR PAIN  UNUSUAL VAGINAL DISCHARGE OR ITCHING   Items with * indicate a potential emergency and should be followed up as soon as possible or go to the Emergency Department if any problems should occur.  Please show the CHEMOTHERAPY ALERT CARD or  IMMUNOTHERAPY ALERT CARD at check-in to the Emergency Department and triage nurse.  Should you have questions after your visit or need to cancel or reschedule your appointment, please contact [place] [facility]  Dept: [phone]  and follow the prompts.  Office hours are 8:00 a.m. to 4:30 p.m. Monday - Friday. Please note that voicemails left after 4:00 p.m. may not be returned until the following business day.  We are closed weekends and major holidays. You have access to a nurse at all times for urgent questions. Please call the main number to the clinic Dept: [phone] and follow the prompts.   For any non-urgent questions, you may also contact your provider using MyChart. We now offer e-Visits for anyone 44 and older to request care online for non-urgent symptoms. For details visit [email or web address].   Also download the MyChart app! Go to the app store, search "MyChart", open the app, select [place], and log in with your MyChart username and password.

## 2022-10-01 NOTE — Assessment & Plan Note (Signed)
Bilateral breast cancers Screening mammogram detected bilateral breast abnormalities. 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%, PR 0%, HER2 negative, Ki-67 less than 1%), 1.4 cm mass (not biopsied) axilla negative Right breast: Calcifications 3 cm biopsy: Grade 2 IDC with DCIS ER 0%, PR 0%, HER2 negative, Ki-67 0%, axilla negative, additional distortion to be biopsied today   Treatment plan: 07/19/2022: Bilateral mastectomies:  Left mastectomy: Grade 2 IDC 6.8 cm with intermediate grade DCIS, margins negative, ER 100%, PR 5%, HER2 negative (2+ IHC, FISH neg), Ki-67 less than 1% 0/5 lymph nodes;  Right mastectomy: Grade 2 IDC 8.5 cm and 1.5 cm with high-grade DCIS, margins negative, ER 95% (on biopsy it was read as negative), PR 0%, HER2 negative, Ki-67 15% 0/3 lymph nodes Adjuvant chemotherapy CMF x 6 cycles (selected because of her performance status) started 08/20/2022 Postmastectomy radiation Antiestrogen therapy   Patient needs an extremely unhealthy diet rich in cholesterol and fats and sugar.  She does not seem to have any interest in changing her diet. ------------------------------------------------------------------------------------------------------------------------ Current treatment: Cycle 3 CMF Chemo toxicities:   Patient has chronic kidney disease and therefore we will have to reduce the dose of methotrexate by 50%.  Return to clinic in 3 weeks for cycle 4

## 2022-10-07 ENCOUNTER — Encounter: Payer: Self-pay | Admitting: *Deleted

## 2022-10-11 ENCOUNTER — Other Ambulatory Visit: Payer: Self-pay | Admitting: Hematology and Oncology

## 2022-10-11 DIAGNOSIS — F419 Anxiety disorder, unspecified: Secondary | ICD-10-CM | POA: Diagnosis not present

## 2022-10-11 DIAGNOSIS — C50912 Malignant neoplasm of unspecified site of left female breast: Secondary | ICD-10-CM | POA: Diagnosis not present

## 2022-10-11 DIAGNOSIS — E1165 Type 2 diabetes mellitus with hyperglycemia: Secondary | ICD-10-CM | POA: Diagnosis not present

## 2022-10-11 DIAGNOSIS — Z9221 Personal history of antineoplastic chemotherapy: Secondary | ICD-10-CM | POA: Diagnosis not present

## 2022-10-12 ENCOUNTER — Telehealth: Payer: Self-pay | Admitting: *Deleted

## 2022-10-12 NOTE — Telephone Encounter (Signed)
Received call from pt requesting prescription of abx to treat UTI.  Pt states she received IV fluids 10/01/22 and since then, she has been urinating constantly and is experiencing bladder irritation.  Per MD pt needing [facility] visit with labs to check UA.  Pt notified and states she will need to contact her husband to see if he can bring her in tomorrow and will call our office back to schedule lab and [facility] visit.

## 2022-10-13 ENCOUNTER — Ambulatory Visit: Payer: Self-pay

## 2022-10-13 NOTE — Patient Outreach (Signed)
  Care Coordination   Follow Up Visit Note   10/13/2022 Name: [name] MRN: [id number] DOB: April 22, 1961  [name] is a 62 y.o. year old female who sees Larose Hires, Gaylyn Lambert, FNP for primary care. I spoke with  [name] by phone today.  What matters to the patients health and wellness today?  Beating breast cancer    Goals Addressed             This Visit's Progress    Beating Breast Cancer       Patient Goals/Self Care Activities: -Patient/Caregiver will self-administer medications as prescribed as evidenced by self-report/primary caregiver report  -Patient/Caregiver will attend all scheduled provider appointments as evidenced by clinician review of documented attendance to scheduled appointments and patient/caregiver report -Patient/Caregiver will call provider office for new concerns or questions as evidenced by review of documented incoming telephone call notes and patient report  -check blood sugar at prescribed times -record values and write them down take them to all doctor visits   -Patient in current treatment for breast cancer- Drain tubes removed and surgical and drain tube wounds healed now.  Patient has 3 more chemo treatments.  Discussed care while in treatment.  Chemo every 3rd Friday-no problems with transportation.   Blood sugars okay last reading 152.           SDOH assessments and interventions completed:  Yes     Care Coordination Interventions:  Yes, provided   Follow up plan: Follow up call scheduled for May 29th    Encounter Outcome:  Pt. Visit Completed   Bary Leriche, RN, MSN [facility] Care Management Care Management Coordinator Direct Line [phone]

## 2022-10-13 NOTE — Patient Instructions (Signed)
Visit Information  Thank you for taking time to visit with me today. Please don't hesitate to contact me if I can be of assistance to you.   Following are the goals we discussed today:   Goals Addressed             This Visit's Progress    Beating Breast Cancer       Patient Goals/Self Care Activities: -Patient/Caregiver will self-administer medications as prescribed as evidenced by self-report/primary caregiver report  -Patient/Caregiver will attend all scheduled provider appointments as evidenced by clinician review of documented attendance to scheduled appointments and patient/caregiver report -Patient/Caregiver will call provider office for new concerns or questions as evidenced by review of documented incoming telephone call notes and patient report  -check blood sugar at prescribed times -record values and write them down take them to all doctor visits   -Patient in current treatment for breast cancer- Drain tubes removed and surgical and drain tube wounds healed now.  Patient has 3 more chemo treatments.  Discussed care while in treatment.  Chemo every 3rd Friday-no problems with transportation.   Blood sugars okay last reading 152.           Our next appointment is by telephone on 11/10/22 at 1000 am  Please call the care guide team at [phone] if you need to cancel or reschedule your appointment.   If you are experiencing a Mental Health or Behavioral Health Crisis or need someone to talk to, please call the Suicide and Crisis Lifeline: 988   Patient verbalizes understanding of instructions and care plan provided today and agrees to view in MyChart. Active MyChart status and patient understanding of how to access instructions and care plan via MyChart confirmed with patient.     The patient has been provided with contact information for the care management team and has been advised to call with any health related questions or concerns.   Bary Leriche, RN,  MSN [facility] Care Management Care Management Coordinator Direct Line [phone]

## 2022-10-16 NOTE — Progress Notes (Signed)
Patient Care Team: Fatima Sanger, FNP as PCP - General (Internal Medicine) Donnelly Angelica, RN as Oncology Nurse Navigator Pershing Proud, RN as Oncology Nurse Navigator Serena Croissant, MD as Consulting Physician (Hematology and Oncology) Griselda Miner, MD as Consulting Physician (General Surgery) Lonie Peak, MD as Attending Physician (Radiation Oncology) Fleeta Emmer, RN as [facility] Care Management  DIAGNOSIS:  Encounter Diagnoses  Name Primary?   Malignant neoplasm of upper-outer quadrant of right breast in female, estrogen receptor negative (HCC) Yes   Malignant neoplasm of overlapping sites of left breast in female, estrogen receptor positive (HCC)    Lower urinary tract infectious disease     SUMMARY OF ONCOLOGIC HISTORY: Oncology History  Malignant neoplasm of upper-outer quadrant of right breast in female, estrogen receptor negative (HCC)  05/14/2022 Initial Diagnosis   Screening mammogram detected bilateral breast abnormalities. 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%, PR 0%, HER2 negative, Ki-67 less than 1%), 1.4 cm mass (not biopsied) axilla negative Right breast: Calcifications 3 cm biopsy: Grade 2 IDC with DCIS ER 0%, PR 0%, HER2 negative, Ki-67 0%, axilla negative, additional distortion to be biopsied today   06/02/2022 Genetic Testing   [facility] CancerNext-Expanded Panel+RNA was Negative. Report date is 06/03/2022.  The CancerNext-Expanded gene panel offered by [facility] and includes sequencing, rearrangement, and RNA analysis for the following 77 genes: AIP, ALK, APC, ATM, AXIN2, BAP1, BARD1, BLM, BMPR1A, BRCA1, BRCA2, BRIP1, CDC73, CDH1, CDK4, CDKN1B, CDKN2A, CHEK2, CTNNA1, DICER1, FANCC, FH, FLCN, GALNT12, KIF1B, LZTR1, MAX, MEN1, MET, MLH1, MSH2, MSH3, MSH6, MUTYH,  NBN, NF1, NF2, NTHL1, PALB2, PHOX2B, PMS2, POT1, PRKAR1A, PTCH1, PTEN, RAD51C, RAD51D, RB1, RECQL, RET, SDHA, SDHAF2, SDHB, SDHC, SDHD, SMAD4, SMARCA4, SMARCB1, SMARCE1, STK11, SUFU, TMEM127, TP53, TSC1, TSC2, VHL and XRCC2 (sequencing and deletion/duplication); EGFR, EGLN1, HOXB13, KIT, MITF, PDGFRA, POLD1, and POLE (sequencing only); EPCAM and GREM1 (deletion/duplication only).     07/19/2022 Surgery   Bilateral mastectomies:  Left mastectomy: Grade 2 IDC 6.8 cm with intermediate grade DCIS, margins negative, ER 100%, PR 5%, HER2 negative (2+ IHC, FISH neg), Ki-67 less than 1% 0/5 lymph nodes;  Right mastectomy: Grade 2 IDC 8.5 cm and 1.5 cm with high-grade DCIS, margins negative, ER 95% (on biopsy it was read as negative), PR 0%, HER2 negative, Ki-67 15% 0/3 lymph nodes   08/20/2022 -  Chemotherapy   Patient is on Treatment Plan : BREAST Adjuvant CMF IV q21d     Malignant neoplasm of overlapping sites of left breast in female, estrogen receptor positive (HCC)  05/24/2022 Initial Diagnosis   Malignant neoplasm of overlapping sites of left breast in female, estrogen receptor positive (HCC)   05/26/2022 Cancer Staging   Staging form: Breast, AJCC 8th Edition - Clinical: Stage IB (cT2, cN0, cM0, G2, ER+, PR+, HER2-) - Signed by Serena Croissant, MD on 05/26/2022 Histologic grading system: 3 grade system   06/02/2022 Genetic Testing   [facility] CancerNext-Expanded Panel+RNA was Negative. Report date is 06/03/2022.  The CancerNext-Expanded gene panel offered by [facility] and includes sequencing, rearrangement, and RNA analysis for the following 77 genes: AIP, ALK, APC, ATM, AXIN2, BAP1, BARD1, BLM, BMPR1A, BRCA1, BRCA2, BRIP1, CDC73, CDH1, CDK4, CDKN1B, CDKN2A, CHEK2, CTNNA1, DICER1, FANCC, FH, FLCN, GALNT12, KIF1B, LZTR1, MAX, MEN1, MET, MLH1, MSH2, MSH3, MSH6, MUTYH, NBN, NF1, NF2, NTHL1,  PALB2, PHOX2B, PMS2, POT1, PRKAR1A, PTCH1, PTEN, RAD51C, RAD51D, RB1, RECQL, RET, SDHA, SDHAF2, SDHB, SDHC, SDHD,  SMAD4, SMARCA4, SMARCB1, SMARCE1, STK11, SUFU, TMEM127, TP53, TSC1, TSC2, VHL and XRCC2 (sequencing and deletion/duplication); EGFR, EGLN1, HOXB13, KIT, MITF, PDGFRA, POLD1, and POLE (sequencing only); EPCAM and GREM1 (deletion/duplication only).     08/20/2022 -  Chemotherapy   Patient is on Treatment Plan : BREAST Adjuvant CMF IV q21d       CHIEF COMPLIANT:  Cycle 4 CMF   INTERVAL HISTORY: [name] is a 62 y.o. female is here because of recent diagnosis of bilateral breast cancers. She presents to the clinic for a follow-up.    ALLERGIES:  is allergic to codeine phosphate, lipitor [atorvastatin], simvastatin, sulfamethoxazole, and tape.  MEDICATIONS:  Current Outpatient Medications  Medication Sig Dispense Refill   acyclovir (ZOVIRAX) 400 MG tablet TAKE 1 TABLET BY MOUTH TWICE A DAY 14 tablet 0   albuterol (VENTOLIN HFA) 108 (90 Base) MCG/ACT inhaler Inhale 2 puffs into the lungs every 6 (six) hours as needed for wheezing or shortness of breath. 6.7 g 0   ALPRAZolam (XANAX) 0.5 MG tablet Take 0.5 mg by mouth 2 (two) times daily.  2   amitriptyline (ELAVIL) 50 MG tablet Take 50 mg by mouth at bedtime.     amLODipine (NORVASC) 10 MG tablet Take 10 mg by mouth daily.     Bempedoic Acid-Ezetimibe (NEXLIZET) 180-10 MG TABS Take 1 tablet by mouth daily. (Patient not taking: Reported on 09/10/2022)     ciprofloxacin (CIPRO) 500 MG tablet Take 1 tablet (500 mg total) by mouth 2 (two) times daily for 7 days. 14 tablet 0   ezetimibe (ZETIA) 10 MG tablet Take 10 mg by mouth daily.     hydrochlorothiazide (HYDRODIURIL) 12.5 MG tablet Take 12.5 mg by mouth daily.     lidocaine-prilocaine (EMLA) cream Apply to affected area once 30 g 3   loratadine (CLARITIN) 10 MG tablet Take 10 mg by mouth daily.     magic mouthwash w/lidocaine SOLN Take 5 mLs by mouth 4 (four) times daily as needed for mouth pain. 240 mL 1   metFORMIN (GLUCOPHAGE-XR) 500 MG 24 hr tablet Take 1,000 mg by mouth 2 (two) times  daily.     olmesartan (BENICAR) 20 MG tablet Take 1 tablet (20 mg total) by mouth daily. (Patient taking differently: Take 40 mg by mouth daily.)     ondansetron (ZOFRAN) 8 MG tablet TAKE 1 TABLET EVERY 8HRS AS NEEDED FOR NAUSEA OR VOMITING. START ON THE 3RD DAY AFTER CHEMO 30 tablet 1   oxyCODONE ER (XTAMPZA ER) 36 MG C12A Take 36 mg by mouth in the morning and at bedtime.     pantoprazole (PROTONIX) 40 MG tablet Take 40 mg by mouth daily. (Patient not taking: Reported on 09/06/2022)     Pediatric Multivitamins-Iron (FLINTSTONES PLUS EXTRA IRON PO) Take by mouth. (Patient not taking: Reported on 09/06/2022)     pregabalin (LYRICA) 100 MG capsule Take 100 mg by mouth 2 (two) times daily.     prochlorperazine (COMPAZINE) 10 MG tablet TAKE 1 TABLET BY MOUTH EVERY 6 HOURS AS NEEDED FOR NAUSEA OR VOMITING. 30 tablet 1   promethazine (PHENERGAN) 25 MG tablet TAKE 1 TABLET BY MOUTH EVERY 6 HOURS AS NEEDED FOR NAUSEA OR VOMITING. 30 tablet 0   traMADol (ULTRAM) 50 MG tablet Take 1 tablet (50 mg total) by mouth every 6 (six) hours as needed. 10 tablet 0   No current facility-administered  medications for this visit.    PHYSICAL EXAMINATION: ECOG PERFORMANCE STATUS: 1 - Symptomatic but completely ambulatory  Vitals:   10/22/22 0910  BP: (!) 85/51  Pulse: (!) 102  Resp: 17  Temp: 98.1 F (36.7 C)  SpO2: 90%   Filed Weights   10/22/22 0910  Weight: 215 lb 3.2 oz (97.6 kg)      LABORATORY DATA:  I have reviewed the data as listed    Latest Ref Rng & Units 10/01/2022    8:14 AM 09/10/2022    7:32 AM 08/27/2022    7:53 AM  CMP  Glucose 70 - 99 mg/dL 161  096  045   BUN 8 - 23 mg/dL 14  20  21    Creatinine 0.44 - 1.00 mg/dL 4.09  8.11  9.14   Sodium 135 - 145 mmol/L 138  135  133   Potassium 3.5 - 5.1 mmol/L 4.4  4.2  4.4   Chloride 98 - 111 mmol/L 104  99  97   CO2 22 - 32 mmol/L 27  27  25    Calcium 8.9 - 10.3 mg/dL 9.2  8.9  9.1   Total Protein 6.5 - 8.1 g/dL 6.9  6.8  6.8   Total  Bilirubin 0.3 - 1.2 mg/dL 0.3  0.4  0.6   Alkaline Phos 38 - 126 U/L 60  62  56   AST 15 - 41 U/L 21  21  50   ALT 0 - 44 U/L 15  24  26      Lab Results  Component Value Date   WBC 6.4 10/22/2022   HGB 10.1 (L) 10/22/2022   HCT 33.0 (L) 10/22/2022   MCV 90.9 10/22/2022   PLT 360 10/22/2022   NEUTROABS 4.3 10/22/2022    ASSESSMENT & PLAN:  Malignant neoplasm of upper-outer quadrant of right breast in female, estrogen receptor negative (HCC) Bilateral breast cancers Screening mammogram detected bilateral breast abnormalities. 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%, PR 0%, HER2 negative, Ki-67 less than 1%), 1.4 cm mass (not biopsied) axilla negative Right breast: Calcifications 3 cm biopsy: Grade 2 IDC with DCIS ER 0%, PR 0%, HER2 negative, Ki-67 0%, axilla negative, additional distortion to be biopsied today   Treatment plan: 07/19/2022: Bilateral mastectomies:  Left mastectomy: Grade 2 IDC 6.8 cm with intermediate grade DCIS, margins negative, ER 100%, PR 5%, HER2 negative (2+ IHC, FISH neg), Ki-67 less than 1% 0/5 lymph nodes;  Right mastectomy: Grade 2 IDC 8.5 cm and 1.5 cm with high-grade DCIS, margins negative, ER 95% (on biopsy it was read as negative), PR 0%, HER2 negative, Ki-67 15% 0/3 lymph nodes Adjuvant chemotherapy CMF x 6 cycles (selected because of her performance status) started 08/20/2022 Postmastectomy radiation Antiestrogen therapy   Patient needs an extremely unhealthy diet rich in cholesterol and fats and sugar.  She does not seem to have any interest in changing her diet. ------------------------------------------------------------------------------------------------------------------------ Current treatment: Cycle 4 CMF Chemo toxicities: Mouth sore: Magic mouthwash has helped her Acid reflux:  Instructed to go back on taking antiacid medication. Fatigue Nausea: Manageable Urinary tract infection: Treated with ciprofloxacin Chemo-induced anemia: Today's hemoglobin is 10.1.  Monitor   With elevated serum creatinine: We will remove the IV fluids because she did not like it.   Monitoring-clearance and adjusting her methotrexate dose.  Return to clinic in 3 weeks for cycle 5    No orders of the defined types were placed in this encounter.  The patient has a good understanding of the overall plan. she agrees with it. she will call with any problems that may develop before the next visit here. Total time spent: 30 mins including face to face time and time spent for planning, charting and co-ordination of care   Tamsen Meek, MD 10/22/22    I Janan Ridge am acting as a Neurosurgeon for [employer]  I have reviewed the above documentation for accuracy and completeness, and I agree with the above.

## 2022-10-17 ENCOUNTER — Other Ambulatory Visit: Payer: Self-pay | Admitting: Hematology and Oncology

## 2022-10-18 ENCOUNTER — Encounter: Payer: Self-pay | Admitting: Hematology and Oncology

## 2022-10-18 ENCOUNTER — Other Ambulatory Visit: Payer: Self-pay

## 2022-10-21 ENCOUNTER — Other Ambulatory Visit: Payer: Self-pay | Admitting: Hematology and Oncology

## 2022-10-21 DIAGNOSIS — C50411 Malignant neoplasm of upper-outer quadrant of right female breast: Secondary | ICD-10-CM

## 2022-10-21 DIAGNOSIS — Z170 Estrogen receptor positive status [ER+]: Secondary | ICD-10-CM

## 2022-10-21 MED FILL — Dexamethasone Sodium Phosphate Inj 100 MG/10ML: INTRAMUSCULAR | Qty: 1 | Status: AC

## 2022-10-22 ENCOUNTER — Inpatient Hospital Stay: Payer: BC Managed Care – PPO

## 2022-10-22 ENCOUNTER — Other Ambulatory Visit: Payer: Self-pay

## 2022-10-22 ENCOUNTER — Inpatient Hospital Stay: Payer: BC Managed Care – PPO | Attending: Hematology and Oncology

## 2022-10-22 ENCOUNTER — Other Ambulatory Visit: Payer: Self-pay | Admitting: *Deleted

## 2022-10-22 ENCOUNTER — Inpatient Hospital Stay (HOSPITAL_BASED_OUTPATIENT_CLINIC_OR_DEPARTMENT_OTHER): Payer: BC Managed Care – PPO | Admitting: Hematology and Oncology

## 2022-10-22 VITALS — BP 111/79 | HR 103 | Resp 19

## 2022-10-22 VITALS — BP 85/51 | HR 102 | Temp 98.1°F | Resp 17 | Wt 215.2 lb

## 2022-10-22 DIAGNOSIS — C50812 Malignant neoplasm of overlapping sites of left female breast: Secondary | ICD-10-CM

## 2022-10-22 DIAGNOSIS — E7800 Pure hypercholesterolemia, unspecified: Secondary | ICD-10-CM

## 2022-10-22 DIAGNOSIS — Z79620 Long term (current) use of immunosuppressive biologic: Secondary | ICD-10-CM | POA: Diagnosis not present

## 2022-10-22 DIAGNOSIS — D509 Iron deficiency anemia, unspecified: Secondary | ICD-10-CM

## 2022-10-22 DIAGNOSIS — E559 Vitamin D deficiency, unspecified: Secondary | ICD-10-CM

## 2022-10-22 DIAGNOSIS — C50411 Malignant neoplasm of upper-outer quadrant of right female breast: Secondary | ICD-10-CM

## 2022-10-22 DIAGNOSIS — Z170 Estrogen receptor positive status [ER+]: Secondary | ICD-10-CM

## 2022-10-22 DIAGNOSIS — Z171 Estrogen receptor negative status [ER-]: Secondary | ICD-10-CM

## 2022-10-22 DIAGNOSIS — E1165 Type 2 diabetes mellitus with hyperglycemia: Secondary | ICD-10-CM

## 2022-10-22 DIAGNOSIS — N390 Urinary tract infection, site not specified: Secondary | ICD-10-CM | POA: Diagnosis not present

## 2022-10-22 DIAGNOSIS — Z5111 Encounter for antineoplastic chemotherapy: Secondary | ICD-10-CM | POA: Diagnosis present

## 2022-10-22 DIAGNOSIS — Z95828 Presence of other vascular implants and grafts: Secondary | ICD-10-CM

## 2022-10-22 DIAGNOSIS — Z79899 Other long term (current) drug therapy: Secondary | ICD-10-CM | POA: Diagnosis not present

## 2022-10-22 DIAGNOSIS — E039 Hypothyroidism, unspecified: Secondary | ICD-10-CM

## 2022-10-22 LAB — CBC WITH DIFFERENTIAL (CANCER CENTER ONLY)
Abs Immature Granulocytes: 0.35 10*3/uL — ABNORMAL HIGH (ref 0.00–0.07)
Basophils Absolute: 0.1 10*3/uL (ref 0.0–0.1)
Basophils Relative: 1 %
Eosinophils Absolute: 0.2 10*3/uL (ref 0.0–0.5)
Eosinophils Relative: 3 %
HCT: 33 % — ABNORMAL LOW (ref 36.0–46.0)
Hemoglobin: 10.1 g/dL — ABNORMAL LOW (ref 12.0–15.0)
Immature Granulocytes: 6 %
Lymphocytes Relative: 13 %
Lymphs Abs: 0.9 10*3/uL (ref 0.7–4.0)
MCH: 27.8 pg (ref 26.0–34.0)
MCHC: 30.6 g/dL (ref 30.0–36.0)
MCV: 90.9 fL (ref 80.0–100.0)
Monocytes Absolute: 0.7 10*3/uL (ref 0.1–1.0)
Monocytes Relative: 11 %
Neutro Abs: 4.3 10*3/uL (ref 1.7–7.7)
Neutrophils Relative %: 66 %
Platelet Count: 360 10*3/uL (ref 150–400)
RBC: 3.63 MIL/uL — ABNORMAL LOW (ref 3.87–5.11)
RDW: 19.7 % — ABNORMAL HIGH (ref 11.5–15.5)
Smear Review: NORMAL
WBC Count: 6.4 10*3/uL (ref 4.0–10.5)
nRBC: 0.9 % — ABNORMAL HIGH (ref 0.0–0.2)

## 2022-10-22 LAB — VITAMIN D 25 HYDROXY (VIT D DEFICIENCY, FRACTURES): Vit D, 25-Hydroxy: 29.67 ng/mL — ABNORMAL LOW (ref 30–100)

## 2022-10-22 LAB — CMP (CANCER CENTER ONLY)
ALT: 11 U/L (ref 0–44)
AST: 12 U/L — ABNORMAL LOW (ref 15–41)
Albumin: 3.6 g/dL (ref 3.5–5.0)
Alkaline Phosphatase: 63 U/L (ref 38–126)
Anion gap: 9 (ref 5–15)
BUN: 20 mg/dL (ref 8–23)
CO2: 29 mmol/L (ref 22–32)
Calcium: 8.9 mg/dL (ref 8.9–10.3)
Chloride: 98 mmol/L (ref 98–111)
Creatinine: 1.21 mg/dL — ABNORMAL HIGH (ref 0.44–1.00)
GFR, Estimated: 50 mL/min — ABNORMAL LOW (ref >60)
Glucose, Bld: 187 mg/dL — ABNORMAL HIGH (ref 70–99)
Potassium: 4.5 mmol/L (ref 3.5–5.1)
Sodium: 136 mmol/L (ref 135–145)
Total Bilirubin: 0.4 mg/dL (ref 0.3–1.2)
Total Protein: 6.6 g/dL (ref 6.5–8.1)

## 2022-10-22 LAB — HEMOGLOBIN A1C
Hgb A1c MFr Bld: 8.1 % — ABNORMAL HIGH (ref 4.8–5.6)
Mean Plasma Glucose: 185.77 mg/dL

## 2022-10-22 LAB — FERRITIN: Ferritin: 79 ng/mL (ref 11–307)

## 2022-10-22 LAB — LIPID PANEL
Cholesterol: 114 mg/dL (ref 0–200)
HDL: 19 mg/dL — ABNORMAL LOW (ref >40)
LDL Cholesterol: 56 mg/dL (ref 0–99)
Total CHOL/HDL Ratio: 6 RATIO
Triglycerides: 197 mg/dL — ABNORMAL HIGH (ref <150)
VLDL: 39 mg/dL (ref 0–40)

## 2022-10-22 LAB — IRON AND IRON BINDING CAPACITY (CC-WL,HP ONLY)
Iron: 35 ug/dL (ref 28–170)
Saturation Ratios: 10 % — ABNORMAL LOW (ref 10.4–31.8)
TIBC: 349 ug/dL (ref 250–450)
UIBC: 314 ug/dL (ref 148–442)

## 2022-10-22 LAB — TSH: TSH: 6.857 u[IU]/mL — ABNORMAL HIGH (ref 0.350–4.500)

## 2022-10-22 MED ORDER — FLUOROURACIL CHEMO INJECTION 2.5 GM/50ML
600.0000 mg/m2 | Freq: Once | INTRAVENOUS | Status: AC
  Administered 2022-10-22: 1250 mg via INTRAVENOUS
  Filled 2022-10-22: qty 25

## 2022-10-22 MED ORDER — SODIUM CHLORIDE 0.9% FLUSH
10.0000 mL | Freq: Once | INTRAVENOUS | Status: AC
  Administered 2022-10-22: 10 mL

## 2022-10-22 MED ORDER — PALONOSETRON HCL INJECTION 0.25 MG/5ML
0.2500 mg | Freq: Once | INTRAVENOUS | Status: AC
  Administered 2022-10-22: 0.25 mg via INTRAVENOUS
  Filled 2022-10-22: qty 5

## 2022-10-22 MED ORDER — CIPROFLOXACIN HCL 500 MG PO TABS
500.0000 mg | ORAL_TABLET | Freq: Two times a day (BID) | ORAL | 0 refills | Status: AC
Start: 2022-10-22 — End: 2022-10-29

## 2022-10-22 MED ORDER — SODIUM CHLORIDE 0.9 % IV SOLN: Freq: Once | INTRAVENOUS | Status: AC

## 2022-10-22 MED ORDER — SODIUM CHLORIDE 0.9 % IV SOLN
600.0000 mg/m2 | Freq: Once | INTRAVENOUS | Status: AC
  Administered 2022-10-22: 1260 mg via INTRAVENOUS
  Filled 2022-10-22: qty 63

## 2022-10-22 MED ORDER — METHOTREXATE SODIUM CHEMO INJECTION (PF) 50 MG/2ML
40.0000 mg/m2 | Freq: Once | INTRAMUSCULAR | Status: AC
  Administered 2022-10-22: 84.5 mg via INTRAVENOUS
  Filled 2022-10-22: qty 3.38

## 2022-10-22 MED ORDER — SODIUM CHLORIDE 0.9 % IV SOLN
10.0000 mg | Freq: Once | INTRAVENOUS | Status: AC
  Administered 2022-10-22: 10 mg via INTRAVENOUS
  Filled 2022-10-22: qty 10

## 2022-10-22 MED ORDER — SODIUM CHLORIDE 0.9% FLUSH
10.0000 mL | INTRAVENOUS | Status: DC | PRN
  Administered 2022-10-22: 10 mL

## 2022-10-22 MED ORDER — HEPARIN SOD (PORK) LOCK FLUSH 100 UNIT/ML IV SOLN
500.0000 [IU] | Freq: Once | INTRAVENOUS | Status: AC | PRN
  Administered 2022-10-22: 500 [IU]

## 2022-10-22 NOTE — Progress Notes (Signed)
Received script from pt PCP Dr. Ron Agee with [facility] requesting CBC with diff, CMP, TSH, Vit D, Iron studies, A1C, and lipid panel be drawn at our office.  Reviewed with MD and verbal orders received to proceed.  Orders placed and CC results to Dr. Ron Agee.  Office fax is [phone] if any future labs or office notes need to be faxed.

## 2022-10-22 NOTE — Assessment & Plan Note (Addendum)
Bilateral breast cancers Screening mammogram detected bilateral breast abnormalities. 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%, PR 0%, HER2 negative, Ki-67 less than 1%), 1.4 cm mass (not biopsied) axilla negative Right breast: Calcifications 3 cm biopsy: Grade 2 IDC with DCIS ER 0%, PR 0%, HER2 negative, Ki-67 0%, axilla negative, additional distortion to be biopsied today   Treatment plan: 07/19/2022: Bilateral mastectomies:  Left mastectomy: Grade 2 IDC 6.8 cm with intermediate grade DCIS, margins negative, ER 100%, PR 5%, HER2 negative (2+ IHC, FISH neg), Ki-67 less than 1% 0/5 lymph nodes;  Right mastectomy: Grade 2 IDC 8.5 cm and 1.5 cm with high-grade DCIS, margins negative, ER 95% (on biopsy it was read as negative), PR 0%, HER2 negative, Ki-67 15% 0/3 lymph nodes Adjuvant chemotherapy CMF x 6 cycles (selected because of her performance status) started 08/20/2022 Postmastectomy radiation Antiestrogen therapy   Patient needs an extremely unhealthy diet rich in cholesterol and fats and sugar.  She does not seem to have any interest in changing her diet. ------------------------------------------------------------------------------------------------------------------------ Current treatment: Cycle 4 CMF Chemo toxicities: Mild source: Magic mouthwash has helped her Acid reflux: Instructed to go back on taking antiacid medication. Fatigue Nausea: Manageable   With elevated serum creatinine: We will add 500 mL of normal saline with each of her treatments.   With improvement in the serum creatinine and the chronic kidney disease, her methotrexate dose has been increased to full dose. Return to clinic in 3 weeks for cycle 5

## 2022-10-22 NOTE — Patient Instructions (Signed)
[place] [facility]  Discharge Instructions: Thank you for choosing [facility] to provide your oncology and hematology care.   If you have a lab appointment with the Cancer Center, please go directly to the Cancer Center and check in at the registration area.   Wear comfortable clothing and clothing appropriate for easy access to any Portacath or PICC line.   We strive to give you quality time with your provider. You may need to reschedule your appointment if you arrive late (15 or more minutes).  Arriving late affects you and other patients whose appointments are after yours.  Also, if you miss three or more appointments without notifying the office, you may be dismissed from the clinic at the provider's discretion.      For prescription refill requests, have your pharmacy contact our office and allow 72 hours for refills to be completed.    Today you received the following chemotherapy and/or immunotherapy agents cytoxan, methotrexate, 5FU      To help prevent nausea and vomiting after your treatment, we encourage you to take your nausea medication as directed.  BELOW ARE SYMPTOMS THAT SHOULD BE REPORTED IMMEDIATELY: *FEVER GREATER THAN 100.4 F (38 C) OR HIGHER *CHILLS OR SWEATING *NAUSEA AND VOMITING THAT IS NOT CONTROLLED WITH YOUR NAUSEA MEDICATION *UNUSUAL SHORTNESS OF BREATH *UNUSUAL BRUISING OR BLEEDING *URINARY PROBLEMS (pain or burning when urinating, or frequent urination) *BOWEL PROBLEMS (unusual diarrhea, constipation, pain near the anus) TENDERNESS IN MOUTH AND THROAT WITH OR WITHOUT PRESENCE OF ULCERS (sore throat, sores in mouth, or a toothache) UNUSUAL RASH, SWELLING OR PAIN  UNUSUAL VAGINAL DISCHARGE OR ITCHING   Items with * indicate a potential emergency and should be followed up as soon as possible or go to the Emergency Department if any problems should occur.  Please show the CHEMOTHERAPY ALERT CARD or IMMUNOTHERAPY  ALERT CARD at check-in to the Emergency Department and triage nurse.  Should you have questions after your visit or need to cancel or reschedule your appointment, please contact [facility]  Dept: [phone]  and follow the prompts.  Office hours are 8:00 a.m. to 4:30 p.m. Monday - Friday. Please note that voicemails left after 4:00 p.m. may not be returned until the following business day.  We are closed weekends and major holidays. You have access to a nurse at all times for urgent questions. Please call the main number to the clinic Dept: [phone] and follow the prompts.   For any non-urgent questions, you may also contact your provider using MyChart. We now offer e-Visits for anyone 65 and older to request care online for non-urgent symptoms. For details visit [email or web address].   Also download the MyChart app! Go to the app store, search "MyChart", open the app, select [place], and log in with your MyChart username and password.

## 2022-10-22 NOTE — Progress Notes (Signed)
Per Dr Pamelia Hoit, ok to tx with HR 103.

## 2022-10-25 NOTE — Progress Notes (Signed)
10/22/22 labs and office noted faxed to Pt's PCP at [phone] with receipt confirmation.

## 2022-10-26 ENCOUNTER — Other Ambulatory Visit: Payer: Self-pay

## 2022-11-04 ENCOUNTER — Other Ambulatory Visit: Payer: Self-pay | Admitting: Hematology and Oncology

## 2022-11-04 DIAGNOSIS — N390 Urinary tract infection, site not specified: Secondary | ICD-10-CM

## 2022-11-04 NOTE — Telephone Encounter (Signed)
Course was completed

## 2022-11-06 ENCOUNTER — Other Ambulatory Visit: Payer: Self-pay

## 2022-11-06 ENCOUNTER — Inpatient Hospital Stay (HOSPITAL_COMMUNITY)
Admission: EM | Admit: 2022-11-06 | Discharge: 2022-11-13 | DRG: 871 | Disposition: A | Discharge status: Home Health | Payer: BC Managed Care – PPO | Attending: Internal Medicine | Admitting: Internal Medicine

## 2022-11-06 ENCOUNTER — Emergency Department (HOSPITAL_COMMUNITY): Payer: BC Managed Care – PPO

## 2022-11-06 DIAGNOSIS — Z9013 Acquired absence of bilateral breasts and nipples: Secondary | ICD-10-CM

## 2022-11-06 DIAGNOSIS — R410 Disorientation, unspecified: Secondary | ICD-10-CM | POA: Diagnosis not present

## 2022-11-06 DIAGNOSIS — E871 Hypo-osmolality and hyponatremia: Secondary | ICD-10-CM | POA: Diagnosis present

## 2022-11-06 DIAGNOSIS — J168 Pneumonia due to other specified infectious organisms: Secondary | ICD-10-CM | POA: Diagnosis not present

## 2022-11-06 DIAGNOSIS — I4510 Unspecified right bundle-branch block: Secondary | ICD-10-CM | POA: Diagnosis present

## 2022-11-06 DIAGNOSIS — Z1152 Encounter for screening for COVID-19: Secondary | ICD-10-CM

## 2022-11-06 DIAGNOSIS — R2241 Localized swelling, mass and lump, right lower limb: Secondary | ICD-10-CM | POA: Diagnosis present

## 2022-11-06 DIAGNOSIS — G894 Chronic pain syndrome: Secondary | ICD-10-CM | POA: Diagnosis present

## 2022-11-06 DIAGNOSIS — F419 Anxiety disorder, unspecified: Secondary | ICD-10-CM | POA: Diagnosis present

## 2022-11-06 DIAGNOSIS — E861 Hypovolemia: Secondary | ICD-10-CM | POA: Diagnosis present

## 2022-11-06 DIAGNOSIS — D701 Agranulocytosis secondary to cancer chemotherapy: Secondary | ICD-10-CM | POA: Diagnosis present

## 2022-11-06 DIAGNOSIS — Z885 Allergy status to narcotic agent status: Secondary | ICD-10-CM

## 2022-11-06 DIAGNOSIS — G928 Other toxic encephalopathy: Secondary | ICD-10-CM | POA: Diagnosis present

## 2022-11-06 DIAGNOSIS — Z833 Family history of diabetes mellitus: Secondary | ICD-10-CM

## 2022-11-06 DIAGNOSIS — T451X5A Adverse effect of antineoplastic and immunosuppressive drugs, initial encounter: Secondary | ICD-10-CM | POA: Diagnosis present

## 2022-11-06 DIAGNOSIS — M1990 Unspecified osteoarthritis, unspecified site: Secondary | ICD-10-CM | POA: Diagnosis present

## 2022-11-06 DIAGNOSIS — Z853 Personal history of malignant neoplasm of breast: Secondary | ICD-10-CM

## 2022-11-06 DIAGNOSIS — Z803 Family history of malignant neoplasm of breast: Secondary | ICD-10-CM

## 2022-11-06 DIAGNOSIS — A419 Sepsis, unspecified organism: Secondary | ICD-10-CM | POA: Diagnosis not present

## 2022-11-06 DIAGNOSIS — R531 Weakness: Secondary | ICD-10-CM | POA: Diagnosis not present

## 2022-11-06 DIAGNOSIS — J9811 Atelectasis: Secondary | ICD-10-CM | POA: Diagnosis not present

## 2022-11-06 DIAGNOSIS — J9601 Acute respiratory failure with hypoxia: Secondary | ICD-10-CM | POA: Diagnosis present

## 2022-11-06 DIAGNOSIS — G9340 Encephalopathy, unspecified: Secondary | ICD-10-CM | POA: Diagnosis present

## 2022-11-06 DIAGNOSIS — N179 Acute kidney failure, unspecified: Secondary | ICD-10-CM | POA: Diagnosis present

## 2022-11-06 DIAGNOSIS — J189 Pneumonia, unspecified organism: Secondary | ICD-10-CM | POA: Diagnosis present

## 2022-11-06 DIAGNOSIS — E1165 Type 2 diabetes mellitus with hyperglycemia: Secondary | ICD-10-CM | POA: Diagnosis present

## 2022-11-06 DIAGNOSIS — Z882 Allergy status to sulfonamides status: Secondary | ICD-10-CM

## 2022-11-06 DIAGNOSIS — N6489 Other specified disorders of breast: Secondary | ICD-10-CM | POA: Diagnosis present

## 2022-11-06 DIAGNOSIS — R6520 Severe sepsis without septic shock: Secondary | ICD-10-CM | POA: Diagnosis present

## 2022-11-06 DIAGNOSIS — Z888 Allergy status to other drugs, medicaments and biological substances status: Secondary | ICD-10-CM

## 2022-11-06 DIAGNOSIS — Z7901 Long term (current) use of anticoagulants: Secondary | ICD-10-CM

## 2022-11-06 DIAGNOSIS — D638 Anemia in other chronic diseases classified elsewhere: Secondary | ICD-10-CM | POA: Diagnosis present

## 2022-11-06 DIAGNOSIS — Z79899 Other long term (current) drug therapy: Secondary | ICD-10-CM

## 2022-11-06 DIAGNOSIS — Z87891 Personal history of nicotine dependence: Secondary | ICD-10-CM

## 2022-11-06 DIAGNOSIS — J45909 Unspecified asthma, uncomplicated: Secondary | ICD-10-CM | POA: Diagnosis present

## 2022-11-06 DIAGNOSIS — R5081 Fever presenting with conditions classified elsewhere: Secondary | ICD-10-CM | POA: Diagnosis present

## 2022-11-06 DIAGNOSIS — Z7984 Long term (current) use of oral hypoglycemic drugs: Secondary | ICD-10-CM

## 2022-11-06 DIAGNOSIS — R4182 Altered mental status, unspecified: Secondary | ICD-10-CM | POA: Diagnosis not present

## 2022-11-06 DIAGNOSIS — E785 Hyperlipidemia, unspecified: Secondary | ICD-10-CM | POA: Diagnosis present

## 2022-11-06 DIAGNOSIS — N170 Acute kidney failure with tubular necrosis: Secondary | ICD-10-CM | POA: Diagnosis not present

## 2022-11-06 DIAGNOSIS — D709 Neutropenia, unspecified: Secondary | ICD-10-CM | POA: Diagnosis present

## 2022-11-06 DIAGNOSIS — I82453 Acute embolism and thrombosis of peroneal vein, bilateral: Secondary | ICD-10-CM | POA: Diagnosis present

## 2022-11-06 DIAGNOSIS — R6521 Severe sepsis with septic shock: Secondary | ICD-10-CM | POA: Diagnosis present

## 2022-11-06 DIAGNOSIS — Z807 Family history of other malignant neoplasms of lymphoid, hematopoietic and related tissues: Secondary | ICD-10-CM

## 2022-11-06 DIAGNOSIS — K219 Gastro-esophageal reflux disease without esophagitis: Secondary | ICD-10-CM | POA: Diagnosis present

## 2022-11-06 DIAGNOSIS — R000 Tachycardia, unspecified: Secondary | ICD-10-CM | POA: Diagnosis not present

## 2022-11-06 DIAGNOSIS — Z8249 Family history of ischemic heart disease and other diseases of the circulatory system: Secondary | ICD-10-CM

## 2022-11-06 DIAGNOSIS — E875 Hyperkalemia: Secondary | ICD-10-CM | POA: Diagnosis present

## 2022-11-06 DIAGNOSIS — I10 Essential (primary) hypertension: Secondary | ICD-10-CM | POA: Diagnosis present

## 2022-11-06 DIAGNOSIS — Z9049 Acquired absence of other specified parts of digestive tract: Secondary | ICD-10-CM

## 2022-11-06 LAB — COMPREHENSIVE METABOLIC PANEL
ALT: 15 U/L (ref 0–44)
AST: 18 U/L (ref 15–41)
Albumin: 3.5 g/dL (ref 3.5–5.0)
Alkaline Phosphatase: 60 U/L (ref 38–126)
Anion gap: 15 (ref 5–15)
BUN: 31 mg/dL — ABNORMAL HIGH (ref 8–23)
CO2: 20 mmol/L — ABNORMAL LOW (ref 22–32)
Calcium: 8.7 mg/dL — ABNORMAL LOW (ref 8.9–10.3)
Chloride: 95 mmol/L — ABNORMAL LOW (ref 98–111)
Creatinine, Ser: 2.97 mg/dL — ABNORMAL HIGH (ref 0.44–1.00)
GFR, Estimated: 17 mL/min — ABNORMAL LOW (ref >60)
Glucose, Bld: 187 mg/dL — ABNORMAL HIGH (ref 70–99)
Potassium: 4.6 mmol/L (ref 3.5–5.1)
Sodium: 130 mmol/L — ABNORMAL LOW (ref 135–145)
Total Bilirubin: 0.8 mg/dL (ref 0.3–1.2)
Total Protein: 6.8 g/dL (ref 6.5–8.1)

## 2022-11-06 LAB — CBC WITH DIFFERENTIAL/PLATELET
Basophils Relative: 6 %
Hemoglobin: 10.1 g/dL — ABNORMAL LOW (ref 12.0–15.0)
MCH: 28.2 pg (ref 26.0–34.0)
Monocytes Absolute: 0.3 10*3/uL (ref 0.1–1.0)
Myelocytes: 1 %
Other: 2 %
Promyelocytes Relative: 1 %
nRBC: 4 /100 WBC — ABNORMAL HIGH

## 2022-11-06 LAB — LACTIC ACID, PLASMA: Lactic Acid, Venous: 3.1 mmol/L (ref 0.5–1.9)

## 2022-11-06 LAB — APTT: aPTT: 30 seconds (ref 24–36)

## 2022-11-06 LAB — RESP PANEL BY RT-PCR (RSV, FLU A&B, COVID)  RVPGX2
Influenza A by PCR: NEGATIVE
Influenza B by PCR: NEGATIVE
Resp Syncytial Virus by PCR: NEGATIVE
SARS Coronavirus 2 by RT PCR: NEGATIVE

## 2022-11-06 LAB — PROTIME-INR
INR: 1.1 (ref 0.8–1.2)
Prothrombin Time: 14.7 seconds (ref 11.4–15.2)

## 2022-11-06 MED ORDER — SODIUM CHLORIDE 0.9 % IV SOLN
2.0000 g | INTRAVENOUS | Status: DC
  Administered 2022-11-06: 2 g via INTRAVENOUS
  Filled 2022-11-06: qty 20

## 2022-11-06 MED ORDER — SODIUM CHLORIDE 0.9 % IV SOLN
500.0000 mg | INTRAVENOUS | Status: DC
  Administered 2022-11-06 – 2022-11-09 (×3): 500 mg via INTRAVENOUS
  Filled 2022-11-06 (×3): qty 5

## 2022-11-06 MED ORDER — LACTATED RINGERS IV SOLN: INTRAVENOUS | Status: DC

## 2022-11-06 MED ORDER — LACTATED RINGERS IV BOLUS (SEPSIS)
1000.0000 mL | Freq: Once | INTRAVENOUS | Status: AC
  Administered 2022-11-06: 1000 mL via INTRAVENOUS

## 2022-11-06 MED ORDER — CHLORHEXIDINE GLUCONATE CLOTH 2 % EX PADS
6.0000 | MEDICATED_PAD | Freq: Every day | CUTANEOUS | Status: DC
  Administered 2022-11-07 – 2022-11-13 (×6): 6 via TOPICAL

## 2022-11-06 MED ORDER — SODIUM CHLORIDE 0.9% FLUSH: 10.0000 mL | INTRAVENOUS | Status: DC | PRN

## 2022-11-06 MED ORDER — SODIUM CHLORIDE 0.9% FLUSH
10.0000 mL | Freq: Two times a day (BID) | INTRAVENOUS | Status: DC
  Administered 2022-11-09 (×2): 10 mL
  Administered 2022-11-11 – 2022-11-12 (×2): 20 mL
  Administered 2022-11-12: 10 mL
  Administered 2022-11-13: 5 mL

## 2022-11-06 MED ORDER — ACETAMINOPHEN 500 MG PO TABS
1000.0000 mg | ORAL_TABLET | Freq: Once | ORAL | Status: AC
  Administered 2022-11-06: 1000 mg via ORAL
  Filled 2022-11-06: qty 2

## 2022-11-06 NOTE — ED Notes (Signed)
IV team at bedside for port access.  

## 2022-11-06 NOTE — ED Triage Notes (Signed)
Pt presents for generalized weakness and confusion starting today. LKW per husband is 7am. NIH 2 for confusion. She has experienced confusion similar to this in past from pneumonia.  In triage spO2 sat 65%  with good pleth despite no obvious distress. Placed on NRB at 15L which improved to 95%.

## 2022-11-06 NOTE — Sepsis Progress Note (Signed)
Elink following code sepsis °

## 2022-11-07 ENCOUNTER — Inpatient Hospital Stay (HOSPITAL_COMMUNITY): Payer: BC Managed Care – PPO

## 2022-11-07 ENCOUNTER — Other Ambulatory Visit: Payer: Self-pay | Admitting: Hematology and Oncology

## 2022-11-07 DIAGNOSIS — I4510 Unspecified right bundle-branch block: Secondary | ICD-10-CM | POA: Diagnosis present

## 2022-11-07 DIAGNOSIS — G894 Chronic pain syndrome: Secondary | ICD-10-CM

## 2022-11-07 DIAGNOSIS — J45909 Unspecified asthma, uncomplicated: Secondary | ICD-10-CM | POA: Diagnosis present

## 2022-11-07 DIAGNOSIS — E861 Hypovolemia: Secondary | ICD-10-CM | POA: Diagnosis present

## 2022-11-07 DIAGNOSIS — I10 Essential (primary) hypertension: Secondary | ICD-10-CM | POA: Diagnosis present

## 2022-11-07 DIAGNOSIS — A419 Sepsis, unspecified organism: Secondary | ICD-10-CM

## 2022-11-07 DIAGNOSIS — Z4803 Encounter for change or removal of drains: Secondary | ICD-10-CM | POA: Diagnosis not present

## 2022-11-07 DIAGNOSIS — M7989 Other specified soft tissue disorders: Secondary | ICD-10-CM | POA: Diagnosis not present

## 2022-11-07 DIAGNOSIS — E875 Hyperkalemia: Secondary | ICD-10-CM | POA: Diagnosis present

## 2022-11-07 DIAGNOSIS — G9340 Encephalopathy, unspecified: Secondary | ICD-10-CM | POA: Diagnosis not present

## 2022-11-07 DIAGNOSIS — Z1152 Encounter for screening for COVID-19: Secondary | ICD-10-CM | POA: Diagnosis not present

## 2022-11-07 DIAGNOSIS — F419 Anxiety disorder, unspecified: Secondary | ICD-10-CM | POA: Diagnosis present

## 2022-11-07 DIAGNOSIS — D709 Neutropenia, unspecified: Secondary | ICD-10-CM

## 2022-11-07 DIAGNOSIS — I82453 Acute embolism and thrombosis of peroneal vein, bilateral: Secondary | ICD-10-CM | POA: Diagnosis present

## 2022-11-07 DIAGNOSIS — Z87891 Personal history of nicotine dependence: Secondary | ICD-10-CM | POA: Diagnosis not present

## 2022-11-07 DIAGNOSIS — N170 Acute kidney failure with tubular necrosis: Secondary | ICD-10-CM | POA: Diagnosis not present

## 2022-11-07 DIAGNOSIS — L7634 Postprocedural seroma of skin and subcutaneous tissue following other procedure: Secondary | ICD-10-CM | POA: Diagnosis not present

## 2022-11-07 DIAGNOSIS — Z9013 Acquired absence of bilateral breasts and nipples: Secondary | ICD-10-CM | POA: Diagnosis not present

## 2022-11-07 DIAGNOSIS — J189 Pneumonia, unspecified organism: Secondary | ICD-10-CM | POA: Diagnosis present

## 2022-11-07 DIAGNOSIS — D701 Agranulocytosis secondary to cancer chemotherapy: Secondary | ICD-10-CM | POA: Diagnosis present

## 2022-11-07 DIAGNOSIS — R6521 Severe sepsis with septic shock: Secondary | ICD-10-CM | POA: Diagnosis present

## 2022-11-07 DIAGNOSIS — E785 Hyperlipidemia, unspecified: Secondary | ICD-10-CM | POA: Diagnosis present

## 2022-11-07 DIAGNOSIS — E871 Hypo-osmolality and hyponatremia: Secondary | ICD-10-CM | POA: Diagnosis present

## 2022-11-07 DIAGNOSIS — J9601 Acute respiratory failure with hypoxia: Secondary | ICD-10-CM | POA: Diagnosis not present

## 2022-11-07 DIAGNOSIS — Z7984 Long term (current) use of oral hypoglycemic drugs: Secondary | ICD-10-CM | POA: Diagnosis not present

## 2022-11-07 DIAGNOSIS — T451X5A Adverse effect of antineoplastic and immunosuppressive drugs, initial encounter: Secondary | ICD-10-CM | POA: Diagnosis present

## 2022-11-07 DIAGNOSIS — E1165 Type 2 diabetes mellitus with hyperglycemia: Secondary | ICD-10-CM

## 2022-11-07 DIAGNOSIS — R6520 Severe sepsis without septic shock: Secondary | ICD-10-CM

## 2022-11-07 DIAGNOSIS — N179 Acute kidney failure, unspecified: Secondary | ICD-10-CM

## 2022-11-07 DIAGNOSIS — R4182 Altered mental status, unspecified: Secondary | ICD-10-CM | POA: Diagnosis not present

## 2022-11-07 DIAGNOSIS — D638 Anemia in other chronic diseases classified elsewhere: Secondary | ICD-10-CM | POA: Diagnosis present

## 2022-11-07 DIAGNOSIS — R5081 Fever presenting with conditions classified elsewhere: Secondary | ICD-10-CM

## 2022-11-07 DIAGNOSIS — G928 Other toxic encephalopathy: Secondary | ICD-10-CM | POA: Diagnosis present

## 2022-11-07 DIAGNOSIS — R2241 Localized swelling, mass and lump, right lower limb: Secondary | ICD-10-CM

## 2022-11-07 LAB — I-STAT VENOUS BLOOD GAS, ED
Acid-base deficit: 2 mmol/L (ref 0.0–2.0)
Bicarbonate: 25 mmol/L (ref 20.0–28.0)
Calcium, Ion: 1.14 mmol/L — ABNORMAL LOW (ref 1.15–1.40)
HCT: 27 % — ABNORMAL LOW (ref 36.0–46.0)
Hemoglobin: 9.2 g/dL — ABNORMAL LOW (ref 12.0–15.0)
O2 Saturation: 71 %
Potassium: 5.4 mmol/L — ABNORMAL HIGH (ref 3.5–5.1)
Sodium: 134 mmol/L — ABNORMAL LOW (ref 135–145)
TCO2: 27 mmol/L (ref 22–32)
pCO2, Ven: 55.7 mmHg (ref 44–60)
pH, Ven: 7.26 (ref 7.25–7.43)
pO2, Ven: 43 mmHg (ref 32–45)

## 2022-11-07 LAB — CK: Total CK: 149 U/L (ref 38–234)

## 2022-11-07 LAB — URINALYSIS, COMPLETE (UACMP) WITH MICROSCOPIC
Glucose, UA: NEGATIVE mg/dL
Hgb urine dipstick: NEGATIVE
Ketones, ur: NEGATIVE mg/dL
Nitrite: NEGATIVE
Protein, ur: 30 mg/dL — AB
Specific Gravity, Urine: 1.02 (ref 1.005–1.030)
pH: 5 (ref 5.0–8.0)

## 2022-11-07 LAB — CBC
HCT: 28.2 % — ABNORMAL LOW (ref 36.0–46.0)
Hemoglobin: 8.2 g/dL — ABNORMAL LOW (ref 12.0–15.0)
MCH: 28 pg (ref 26.0–34.0)
MCHC: 29.1 g/dL — ABNORMAL LOW (ref 30.0–36.0)
MCV: 96.2 fL (ref 80.0–100.0)
Platelets: 367 10*3/uL (ref 150–400)
RBC: 2.93 MIL/uL — ABNORMAL LOW (ref 3.87–5.11)
RDW: 21.2 % — ABNORMAL HIGH (ref 11.5–15.5)
WBC: 1.5 10*3/uL — ABNORMAL LOW (ref 4.0–10.5)
nRBC: 4 % — ABNORMAL HIGH (ref 0.0–0.2)

## 2022-11-07 LAB — CBC WITH DIFFERENTIAL/PLATELET
Abs Immature Granulocytes: 0 10*3/uL (ref 0.00–0.07)
Band Neutrophils: 4 %
Basophils Absolute: 0.1 10*3/uL (ref 0.0–0.1)
Eosinophils Absolute: 0 10*3/uL (ref 0.0–0.5)
Eosinophils Relative: 0 %
HCT: 33.7 % — ABNORMAL LOW (ref 36.0–46.0)
Lymphocytes Relative: 53 %
Lymphs Abs: 1 10*3/uL (ref 0.7–4.0)
MCHC: 30 g/dL (ref 30.0–36.0)
MCV: 94.1 fL (ref 80.0–100.0)
Monocytes Relative: 15 %
Neutro Abs: 0.4 10*3/uL — CL (ref 1.7–7.7)
Neutrophils Relative %: 18 %
Platelets: 464 10*3/uL — ABNORMAL HIGH (ref 150–400)
RBC: 3.58 MIL/uL — ABNORMAL LOW (ref 3.87–5.11)
RDW: 21.6 % — ABNORMAL HIGH (ref 11.5–15.5)
WBC: 1.8 10*3/uL — ABNORMAL LOW (ref 4.0–10.5)
nRBC: 2.3 % — ABNORMAL HIGH (ref 0.0–0.2)

## 2022-11-07 LAB — D-DIMER, QUANTITATIVE: D-Dimer, Quant: 2.57 ug/mL-FEU — ABNORMAL HIGH (ref 0.00–0.50)

## 2022-11-07 LAB — GLUCOSE, CAPILLARY
Glucose-Capillary: 143 mg/dL — ABNORMAL HIGH (ref 70–99)
Glucose-Capillary: 98 mg/dL (ref 70–99)
Glucose-Capillary: 122 mg/dL — ABNORMAL HIGH (ref 70–99)
Glucose-Capillary: 109 mg/dL — ABNORMAL HIGH (ref 70–99)
Glucose-Capillary: 143 mg/dL — ABNORMAL HIGH (ref 70–99)
Glucose-Capillary: 118 mg/dL — ABNORMAL HIGH (ref 70–99)

## 2022-11-07 LAB — CBG MONITORING, ED: Glucose-Capillary: 139 mg/dL — ABNORMAL HIGH (ref 70–99)

## 2022-11-07 LAB — BASIC METABOLIC PANEL
Anion gap: 8 (ref 5–15)
BUN: 34 mg/dL — ABNORMAL HIGH (ref 8–23)
CO2: 24 mmol/L (ref 22–32)
Calcium: 7.7 mg/dL — ABNORMAL LOW (ref 8.9–10.3)
Chloride: 100 mmol/L (ref 98–111)
Creatinine, Ser: 3.4 mg/dL — ABNORMAL HIGH (ref 0.44–1.00)
GFR, Estimated: 14 mL/min — ABNORMAL LOW (ref >60)
Glucose, Bld: 112 mg/dL — ABNORMAL HIGH (ref 70–99)
Potassium: 5.4 mmol/L — ABNORMAL HIGH (ref 3.5–5.1)
Sodium: 132 mmol/L — ABNORMAL LOW (ref 135–145)

## 2022-11-07 LAB — CULTURE, BLOOD (ROUTINE X 2)

## 2022-11-07 LAB — PROTIME-INR
INR: 1.2 (ref 0.8–1.2)
Prothrombin Time: 15.6 seconds — ABNORMAL HIGH (ref 11.4–15.2)

## 2022-11-07 LAB — COMPREHENSIVE METABOLIC PANEL
ALT: 15 U/L (ref 0–44)
AST: 14 U/L — ABNORMAL LOW (ref 15–41)
Albumin: 2.9 g/dL — ABNORMAL LOW (ref 3.5–5.0)
Alkaline Phosphatase: 49 U/L (ref 38–126)
Anion gap: 10 (ref 5–15)
BUN: 31 mg/dL — ABNORMAL HIGH (ref 8–23)
CO2: 23 mmol/L (ref 22–32)
Calcium: 7.9 mg/dL — ABNORMAL LOW (ref 8.9–10.3)
Chloride: 101 mmol/L (ref 98–111)
Creatinine, Ser: 3.32 mg/dL — ABNORMAL HIGH (ref 0.44–1.00)
GFR, Estimated: 15 mL/min — ABNORMAL LOW (ref >60)
Glucose, Bld: 164 mg/dL — ABNORMAL HIGH (ref 70–99)
Potassium: 5.4 mmol/L — ABNORMAL HIGH (ref 3.5–5.1)
Sodium: 134 mmol/L — ABNORMAL LOW (ref 135–145)
Total Bilirubin: 0.6 mg/dL (ref 0.3–1.2)
Total Protein: 5.8 g/dL — ABNORMAL LOW (ref 6.5–8.1)

## 2022-11-07 LAB — LACTIC ACID, PLASMA
Lactic Acid, Venous: 2.2 mmol/L (ref 0.5–1.9)
Lactic Acid, Venous: 1.9 mmol/L (ref 0.5–1.9)
Lactic Acid, Venous: 2.5 mmol/L (ref 0.5–1.9)

## 2022-11-07 LAB — MRSA NEXT GEN BY PCR, NASAL: MRSA by PCR Next Gen: NOT DETECTED

## 2022-11-07 LAB — CORTISOL: Cortisol, Plasma: 15.8 ug/dL

## 2022-11-07 LAB — CORTISOL-AM, BLOOD: Cortisol - AM: 25.2 ug/dL — ABNORMAL HIGH (ref 6.7–22.6)

## 2022-11-07 LAB — PROCALCITONIN: Procalcitonin: 0.9 ng/mL

## 2022-11-07 MED ORDER — LACTATED RINGERS IV SOLN
150.0000 mL/h | INTRAVENOUS | Status: AC
  Administered 2022-11-07 (×3): 150 mL/h via INTRAVENOUS

## 2022-11-07 MED ORDER — LACTATED RINGERS IV BOLUS
1000.0000 mL | Freq: Once | INTRAVENOUS | Status: AC
  Administered 2022-11-07: 1000 mL via INTRAVENOUS

## 2022-11-07 MED ORDER — SODIUM CHLORIDE 0.9 % IV SOLN
1.0000 g | Freq: Two times a day (BID) | INTRAVENOUS | Status: DC
  Filled 2022-11-07 (×3): qty 10

## 2022-11-07 MED ORDER — TBO-FILGRASTIM 480 MCG/0.8ML ~~LOC~~ SOSY
480.0000 ug | PREFILLED_SYRINGE | SUBCUTANEOUS | Status: DC
  Administered 2022-11-07 – 2022-11-09 (×3): 480 ug via SUBCUTANEOUS
  Filled 2022-11-07 (×3): qty 0.8

## 2022-11-07 MED ORDER — HEPARIN (PORCINE) 25000 UT/250ML-% IV SOLN
2050.0000 [IU]/h | INTRAVENOUS | Status: DC
  Administered 2022-11-07: 1300 [IU]/h via INTRAVENOUS
  Administered 2022-11-08: 1600 [IU]/h via INTRAVENOUS
  Administered 2022-11-09: 1750 [IU]/h via INTRAVENOUS
  Filled 2022-11-07 (×3): qty 250

## 2022-11-07 MED ORDER — ALBUTEROL SULFATE (2.5 MG/3ML) 0.083% IN NEBU: 2.5000 mg | INHALATION_SOLUTION | RESPIRATORY_TRACT | Status: DC | PRN

## 2022-11-07 MED ORDER — FENTANYL CITRATE PF 50 MCG/ML IJ SOSY: 12.5000 ug | PREFILLED_SYRINGE | INTRAMUSCULAR | Status: DC | PRN

## 2022-11-07 MED ORDER — VANCOMYCIN VARIABLE DOSE PER UNSTABLE RENAL FUNCTION (PHARMACIST DOSING): Status: DC

## 2022-11-07 MED ORDER — ALPRAZOLAM 0.5 MG PO TABS
0.5000 mg | ORAL_TABLET | Freq: Two times a day (BID) | ORAL | Status: DC
  Administered 2022-11-07 – 2022-11-08 (×2): 0.5 mg via ORAL
  Filled 2022-11-07 (×2): qty 1

## 2022-11-07 MED ORDER — MELATONIN 5 MG PO TABS
5.0000 mg | ORAL_TABLET | Freq: Once | ORAL | Status: AC
  Administered 2022-11-08: 5 mg via ORAL
  Filled 2022-11-07: qty 1

## 2022-11-07 MED ORDER — SODIUM CHLORIDE 0.9 % IV SOLN
2.0000 g | INTRAVENOUS | Status: DC
  Administered 2022-11-08 – 2022-11-09 (×2): 2 g via INTRAVENOUS
  Filled 2022-11-07 (×2): qty 12.5

## 2022-11-07 MED ORDER — OXYCODONE HCL ER 10 MG PO T12A
30.0000 mg | EXTENDED_RELEASE_TABLET | Freq: Two times a day (BID) | ORAL | Status: DC
  Administered 2022-11-07 – 2022-11-13 (×12): 30 mg via ORAL
  Filled 2022-11-07 (×4): qty 3
  Filled 2022-11-07 (×3): qty 2
  Filled 2022-11-07 (×2): qty 3
  Filled 2022-11-07: qty 2
  Filled 2022-11-07 (×2): qty 3

## 2022-11-07 MED ORDER — ACETAMINOPHEN 325 MG PO TABS
650.0000 mg | ORAL_TABLET | Freq: Four times a day (QID) | ORAL | Status: DC | PRN
  Administered 2022-11-07 – 2022-11-13 (×7): 650 mg via ORAL
  Filled 2022-11-07 (×8): qty 2

## 2022-11-07 MED ORDER — ACETAMINOPHEN 650 MG RE SUPP: 650.0000 mg | Freq: Four times a day (QID) | RECTAL | Status: DC | PRN

## 2022-11-07 MED ORDER — SODIUM CHLORIDE 0.9 % IV SOLN
250.0000 mL | INTRAVENOUS | Status: DC
  Administered 2022-11-07: 250 mL via INTRAVENOUS

## 2022-11-07 MED ORDER — EZETIMIBE 10 MG PO TABS
10.0000 mg | ORAL_TABLET | Freq: Every day | ORAL | Status: DC
  Administered 2022-11-07 – 2022-11-13 (×7): 10 mg via ORAL
  Filled 2022-11-07 (×7): qty 1

## 2022-11-07 MED ORDER — SODIUM ZIRCONIUM CYCLOSILICATE 10 G PO PACK
10.0000 g | PACK | Freq: Once | ORAL | Status: AC
  Administered 2022-11-07: 10 g via ORAL
  Filled 2022-11-07: qty 1

## 2022-11-07 MED ORDER — ONDANSETRON HCL 4 MG/2ML IJ SOLN: 4.0000 mg | Freq: Four times a day (QID) | INTRAMUSCULAR | Status: DC | PRN

## 2022-11-07 MED ORDER — VANCOMYCIN HCL 2000 MG/400ML IV SOLN
2000.0000 mg | Freq: Once | INTRAVENOUS | Status: AC
  Administered 2022-11-07: 2000 mg via INTRAVENOUS
  Filled 2022-11-07 (×2): qty 400

## 2022-11-07 MED ORDER — SODIUM CHLORIDE 0.9 % IV SOLN
2.0000 g | Freq: Once | INTRAVENOUS | Status: AC
  Administered 2022-11-07: 2 g via INTRAVENOUS
  Filled 2022-11-07: qty 12.5

## 2022-11-07 MED ORDER — INSULIN ASPART 100 UNIT/ML IJ SOLN
0.0000 [IU] | INTRAMUSCULAR | Status: DC
  Administered 2022-11-07 – 2022-11-09 (×7): 1 [IU] via SUBCUTANEOUS

## 2022-11-07 MED ORDER — NOREPINEPHRINE 4 MG/250ML-% IV SOLN
2.0000 ug/min | INTRAVENOUS | Status: DC
  Administered 2022-11-07: 2 ug/min via INTRAVENOUS
  Administered 2022-11-08: 4 ug/min via INTRAVENOUS
  Administered 2022-11-09: 3 ug/min via INTRAVENOUS
  Filled 2022-11-07 (×3): qty 250

## 2022-11-07 MED ORDER — ONDANSETRON HCL 4 MG PO TABS: 4.0000 mg | ORAL_TABLET | Freq: Four times a day (QID) | ORAL | Status: DC | PRN

## 2022-11-07 MED ORDER — HEPARIN BOLUS VIA INFUSION
2500.0000 [IU] | Freq: Once | INTRAVENOUS | Status: AC
  Administered 2022-11-07: 2500 [IU] via INTRAVENOUS
  Filled 2022-11-07: qty 2500

## 2022-11-07 MED ORDER — ENOXAPARIN SODIUM 40 MG/0.4ML IJ SOSY: 40.0000 mg | PREFILLED_SYRINGE | INTRAMUSCULAR | Status: DC

## 2022-11-07 MED ORDER — MELATONIN 3 MG PO TABS: 3.0000 mg | ORAL_TABLET | Freq: Every day | ORAL | Status: DC

## 2022-11-07 NOTE — ED Notes (Signed)
ED TO INPATIENT HANDOFF REPORT  ED Nurse Name and Phone #: Shawn [phone]  S Name/Age/Gender [name] 62 y.o. female Room/Bed: 031C/031C  Code Status   Code Status: Full Code  Home/SNF/Other Home Patient oriented to: self  Is this baseline? No   Triage Complete: Triage complete  Chief Complaint Febrile neutropenia (HCC) [D70.9, R50.81]  Triage Note Pt presents for generalized weakness and confusion starting today. LKW per husband is 7am. NIH 2 for confusion. She has experienced confusion similar to this in past from pneumonia.  In triage spO2 sat 65%  with good pleth despite no obvious distress. Placed on NRB at 15L which improved to 95%.     Allergies Allergies  Allergen Reactions   Codeine Phosphate Other (See Comments)    Gi upset   Lipitor [Atorvastatin]     Joint pain   Simvastatin     REACTION: muscle \\T \ joint pains   Sulfamethoxazole Hives   Tape Rash    Adhesive. Pt states "it takes skin off"  Not even paper tape    Level of Care/Admitting Diagnosis ED Disposition     ED Disposition  Admit   Condition  --   Comment  Hospital Area: [facility] [[id number]]  Level of Care: Progressive [102]  Admit to Progressive based on following criteria: MULTISYSTEM THREATS such as stable sepsis, metabolic/electrolyte imbalance with or without encephalopathy that is responding to early treatment.  May admit patient to [facility] or [facility] if equivalent level of care is available:: No  Covid Evaluation: Asymptomatic - no recent exposure (last 10 days) testing not required  Diagnosis: Febrile neutropenia [facility]) [[id number]]  Admitting Physician: Wyvonnia Dusky  Attending Physician: Hillary Bow [phone]  Certification:: I certify this patient will need inpatient services for at least 2 midnights  Estimated Length of Stay: 5          B Medical/Surgery History Past Medical History:  Diagnosis Date   Anemia    Arthritis     Asthma    Bladder spasms    Breast cancer (HCC)    Diabetes mellitus without complication (HCC)    GERD (gastroesophageal reflux disease)    Heart murmur    Hyperlipidemia    Hypertension    Past Surgical History:  Procedure Laterality Date   BACK SURGERY  1989, 1990, 2006   BREAST BIOPSY Left 05/14/2022   [place] LT BREAST BX W LOC DEV 1ST LESION IMG BX SPEC US GUIDE 05/14/2022 [facility]   BREAST BIOPSY Right 05/14/2022   MM RT BREAST BX W LOC DEV 1ST LESION IMAGE BX SPEC STEREO GUIDE 05/14/2022 [facility]   BREAST BIOPSY Left 05/14/2022   MM LT BREAST BX W LOC DEV 1ST LESION IMAGE BX SPEC STEREO GUIDE 05/14/2022 [facility]   BREAST BIOPSY Right 05/26/2022   MM RT BREAST BX W LOC DEV 1ST LESION IMAGE BX SPEC STEREO GUIDE 05/26/2022 [facility]   BREAST BIOPSY Right 05/26/2022   MM RT BREAST BX W LOC DEV EA AD LESION IMG BX SPEC STEREO GUIDE 05/26/2022 [facility]   BREAST LUMPECTOMY  06/14/1974   bi-lat , both benign    CHOLECYSTECTOMY  06/14/1996   LEG SURGERY  06/15/2007   work related injury   PARTIAL HYSTERECTOMY     still has ovaries   PORTACATH PLACEMENT Right 08/12/2022   Procedure: INSERTION PORT-A-CATH;  Surgeon: Griselda Miner, MD;  Location: [facility];  Service: General;  Laterality: Right;  SENTINEL NODE BIOPSY Bilateral 07/19/2022   Procedure: BILATERAL SENTINEL NODE BIOPSY;  Surgeon: Griselda Miner, MD;  Location: [facility] OR;  Service: General;  Laterality: Bilateral;   TOTAL MASTECTOMY Bilateral 07/19/2022   Procedure: BILATERAL TOTAL MASTECTOMY;  Surgeon: Griselda Miner, MD;  Location: [facility] OR;  Service: General;  Laterality: Bilateral;     A IV Location/Drains/Wounds Patient Lines/Drains/Airways Status     Active Line/Drains/Airways     Name Placement date Placement time Site Days   Implanted Port 08/12/22 Right Chest 08/12/22  1037  Chest  87   Peripheral IV 11/06/22 20 G Anterior;Proximal;Right Forearm 11/06/22  2303  Forearm  1    Closed System Drain 1 Right;Lateral Breast Bulb (JP) 19 Fr. 07/19/22  1245  Breast  111   Closed System Drain 2 Left;Lateral Breast Bulb (JP) 19 Fr. 07/19/22  1246  Breast  111            Intake/Output Last 24 hours  Intake/Output Summary (Last 24 hours) at 11/07/2022 0219 Last data filed at 11/07/2022 5284 Gross per 24 hour  Intake 1350 ml  Output --  Net 1350 ml    Labs/Imaging Results for orders placed or performed during the hospital encounter of 11/06/22 (from the past 48 hour(s))  Lactic acid, plasma     Status: Abnormal   Collection Time: 11/06/22  8:18 PM  Result Value Ref Range   Lactic Acid, Venous 3.1 (HH) 0.5 - 1.9 mmol/L    Comment: CRITICAL RESULT CALLED TO, READ BACK BY AND VERIFIED WITH Lanny Hurst, RN, 2313, 11/06/22, EADEDOKUN Performed at [facility], [address] [place], [place] [address]   Comprehensive metabolic panel     Status: Abnormal   Collection Time: 11/06/22  8:18 PM  Result Value Ref Range   Sodium 130 (L) 135 - 145 mmol/L   Potassium 4.6 3.5 - 5.1 mmol/L   Chloride 95 (L) 98 - 111 mmol/L   CO2 20 (L) 22 - 32 mmol/L   Glucose, Bld 187 (H) 70 - 99 mg/dL    Comment: Glucose reference range applies only to samples taken after fasting for at least 8 hours.   BUN 31 (H) 8 - 23 mg/dL   Creatinine, Ser 0.10 (H) 0.44 - 1.00 mg/dL   Calcium 8.7 (L) 8.9 - 10.3 mg/dL   Total Protein 6.8 6.5 - 8.1 g/dL   Albumin 3.5 3.5 - 5.0 g/dL   AST 18 15 - 41 U/L   ALT 15 0 - 44 U/L   Alkaline Phosphatase 60 38 - 126 U/L   Total Bilirubin 0.8 0.3 - 1.2 mg/dL   GFR, Estimated 17 (L) >60 mL/min    Comment: (NOTE) Calculated using the CKD-EPI Creatinine Equation (2021)    Anion gap 15 5 - 15    Comment: Performed at [facility], [address], [place], [place] [address]  CBC with Differential     Status: Abnormal   Collection Time: 11/06/22  8:18 PM  Result Value Ref Range   WBC 1.8 (L) 4.0 - 10.5 K/uL   RBC 3.58 (L) 3.87 - 5.11 MIL/uL    Hemoglobin 10.1 (L) 12.0 - 15.0 g/dL   HCT 66.4 (L) 40.3 - 47.4 %   MCV 94.1 80.0 - 100.0 fL   MCH 28.2 26.0 - 34.0 pg   MCHC 30.0 30.0 - 36.0 g/dL   RDW 25.9 (H) 56.3 - 87.5 %   Platelets 464 (H) 150 - 400 K/uL  nRBC 2.3 (H) 0.0 - 0.2 %   Neutrophils Relative % 18 %   Neutro Abs 0.4 (LL) 1.7 - 7.7 K/uL    Comment: This critical result has verified and been called to [facility] RN by Burman Nieves on 05 25 2024 at 2331, and has been read back.    Band Neutrophils 4 %   Lymphocytes Relative 53 %   Lymphs Abs 1.0 0.7 - 4.0 K/uL   Monocytes Relative 15 %   Monocytes Absolute 0.3 0.1 - 1.0 K/uL   Eosinophils Relative 0 %   Eosinophils Absolute 0.0 0.0 - 0.5 K/uL   Basophils Relative 6 %   Basophils Absolute 0.1 0.0 - 0.1 K/uL   Other 2 %   nRBC 4 (H) 0 /100 WBC   Myelocytes 1 %   Promyelocytes Relative 1 %   Abs Immature Granulocytes 0.00 0.00 - 0.07 K/uL    Comment: Performed at [facility], [address] [place], [place] [address]  Protime-INR     Status: None   Collection Time: 11/06/22  8:18 PM  Result Value Ref Range   Prothrombin Time 14.7 11.4 - 15.2 seconds   INR 1.1 0.8 - 1.2    Comment: (NOTE) INR goal varies based on device and disease states. Performed at [facility], [address] [place], [place] [address]   APTT     Status: None   Collection Time: 11/06/22  8:18 PM  Result Value Ref Range   aPTT 30 24 - 36 seconds    Comment: Performed at [facility], [address] [place], [place] [address]  Resp panel by RT-PCR (RSV, Flu A&B, Covid) Anterior Nasal Swab     Status: None   Collection Time: 11/06/22 11:05 PM   Specimen: Anterior Nasal Swab  Result Value Ref Range   SARS Coronavirus 2 by RT PCR NEGATIVE NEGATIVE   Influenza A by PCR NEGATIVE NEGATIVE   Influenza B by PCR NEGATIVE NEGATIVE    Comment: (NOTE) The Xpert Xpress SARS-CoV-2/FLU/RSV plus assay is intended as an aid in the diagnosis of influenza from Nasopharyngeal swab  specimens and should not be used as a sole basis for treatment. Nasal washings and aspirates are unacceptable for Xpert Xpress SARS-CoV-2/FLU/RSV testing.  Fact Sheet for Patients: BloggerCourse.com  Fact Sheet for Healthcare Providers: SeriousBroker.it  This test is not yet approved or cleared by the [place] FDA and has been authorized for detection and/or diagnosis of SARS-CoV-2 by FDA under an Emergency Use Authorization (EUA). This EUA will remain in effect (meaning this test can be used) for the duration of the COVID-19 declaration under Section 564(b)(1) of the Act, 21 U.S.C. section 360bbb-3(b)(1), unless the authorization is terminated or revoked.     Resp Syncytial Virus by PCR NEGATIVE NEGATIVE    Comment: (NOTE) Fact Sheet for Patients: BloggerCourse.com  Fact Sheet for Healthcare Providers: SeriousBroker.it  This test is not yet approved or cleared by the [place] FDA and has been authorized for detection and/or diagnosis of SARS-CoV-2 by FDA under an Emergency Use Authorization (EUA). This EUA will remain in effect (meaning this test can be used) for the duration of the COVID-19 declaration under Section 564(b)(1) of the Act, 21 U.S.C. section 360bbb-3(b)(1), unless the authorization is terminated or revoked.  Performed at [facility], [address], [place], [place] [address]   Lactic acid, plasma     Status: Abnormal   Collection Time: 11/07/22  12:52 AM  Result Value Ref Range   Lactic Acid, Venous 2.2 (HH) 0.5 - 1.9 mmol/L    Comment: CRITICAL VALUE NOTED. VALUE IS CONSISTENT WITH PREVIOUSLY REPORTED/CALLED VALUE Performed at [facility], [address], [place], [place] [address]   I-Stat venous blood gas, [facility] ED, [facility], [facility])     Status: Abnormal   Collection Time: 11/07/22  1:13 AM  Result Value Ref Range   pH, Ven 7.260 7.25 -  7.43   pCO2, Ven 55.7 44 - 60 mmHg   pO2, Ven 43 32 - 45 mmHg   Bicarbonate 25.0 20.0 - 28.0 mmol/L   TCO2 27 22 - 32 mmol/L   O2 Saturation 71 %   Acid-base deficit 2.0 0.0 - 2.0 mmol/L   Sodium 134 (L) 135 - 145 mmol/L   Potassium 5.4 (H) 3.5 - 5.1 mmol/L   Calcium, Ion 1.14 (L) 1.15 - 1.40 mmol/L   HCT 27.0 (L) 36.0 - 46.0 %   Hemoglobin 9.2 (L) 12.0 - 15.0 g/dL   Sample type VENOUS    DG Chest Portable 1 View  Result Date: 11/06/2022 CLINICAL DATA:  Weakness and confusion. EXAM: PORTABLE CHEST 1 VIEW COMPARISON:  August 12, 2022 FINDINGS: Stable right-sided venous Port-A-Cath positioning is seen. The heart size and mediastinal contours are within normal limits. Mild atelectasis is seen within the bilateral lung bases. There is no evidence of focal consolidation, pleural effusion or pneumothorax. Radiopaque surgical clips are seen overlying the lateral chest wall soft tissues, bilaterally. Multilevel degenerative changes are noted throughout the thoracic spine. IMPRESSION: Mild bibasilar atelectasis. Electronically Signed   By: Aram Candela M.D.   On: 11/06/2022 23:08    Pending Labs Unresulted Labs (From admission, onward)     Start     Ordered   11/07/22 0500  Protime-INR  Tomorrow morning,   R        11/07/22 0102   11/07/22 0500  Cortisol-am, blood  Tomorrow morning,   R        11/07/22 0102   11/07/22 0500  Procalcitonin  Tomorrow morning,   R       References:    Procalcitonin Lower Respiratory Tract Infection AND Sepsis Procalcitonin Algorithm   11/07/22 0102   11/07/22 0500  Comprehensive metabolic panel  Tomorrow morning,   R        11/07/22 0102   11/07/22 0500  CBC  Daily,   R      11/07/22 0118   11/07/22 0127  D-dimer, quantitative  Once,   R        11/07/22 0126   11/07/22 0104  Urinalysis, Complete w Microscopic -Urine, Clean Catch  ONCE - URGENT,   URGENT       Question:  Specimen Source  Answer:  Urine, Clean Catch   11/07/22 0103   11/07/22 0102   Lactic acid, plasma  STAT Now then every 3 hours,   R (with STAT occurrences)      11/07/22 0102   11/06/22 2226  Blood Culture (routine x 2)  (Septic presentation on arrival (screening labs, nursing and treatment orders for obvious sepsis))  BLOOD CULTURE X 2,   STAT      11/06/22 2227   11/06/22 2018  Pathologist smear review  Once,   R        11/06/22 2018            Vitals/Pain Today's Vitals   11/07/22 0058 11/07/22 0100 11/07/22 6045  11/07/22 0215  BP: (!) 91/45  107/63 (!) 113/58  Pulse: 100  (!) 102 (!) 101  Resp: 15  (!) 25 17  Temp: 99.2 F (37.3 C)     TempSrc: Oral     SpO2: 95%  94% 94%  Weight:  97.5 kg    Height:  5\' 6"  (1.676 m)    PainSc: Asleep  Asleep     Isolation Precautions Protective Precautions  Medications Medications  lactated ringers infusion (has no administration in time range)  azithromycin (ZITHROMAX) 500 mg in sodium chloride 0.9 % 250 mL IVPB (0 mg Intravenous Stopped 11/07/22 0001)  sodium chloride flush (NS) 0.9 % injection 10-40 mL (has no administration in time range)  sodium chloride flush (NS) 0.9 % injection 10-40 mL (has no administration in time range)  Chlorhexidine Gluconate Cloth 2 % PADS 6 each (has no administration in time range)  lactated ringers infusion (has no administration in time range)  enoxaparin (LOVENOX) injection 40 mg (has no administration in time range)  acetaminophen (TYLENOL) tablet 650 mg (has no administration in time range)    Or  acetaminophen (TYLENOL) suppository 650 mg (has no administration in time range)  ondansetron (ZOFRAN) tablet 4 mg (has no administration in time range)    Or  ondansetron (ZOFRAN) injection 4 mg (has no administration in time range)  ceFEPIme (MAXIPIME) 2 g in sodium chloride 0.9 % 100 mL IVPB (has no administration in time range)  ceFEPIme (MAXIPIME) 1 g in sodium chloride 0.9 % 100 mL IVPB (has no administration in time range)  Tbo-Filgrastim (GRANIX) injection 480 mcg (480  mcg Subcutaneous Given 11/07/22 0214)  fentaNYL (SUBLIMAZE) injection 12.5-25 mcg (has no administration in time range)  insulin aspart (novoLOG) injection 0-9 Units (has no administration in time range)  acetaminophen (TYLENOL) tablet 1,000 mg (1,000 mg Oral Given 11/06/22 2228)  lactated ringers bolus 1,000 mL (0 mLs Intravenous Stopped 11/07/22 0052)  lactated ringers bolus 1,000 mL (1,000 mLs Intravenous New Bag/Given 11/07/22 0056)    Mobility walks     Focused Assessments See chart   R Recommendations: See Admitting Provider Note  Report given to:   Additional Notes:  See chart

## 2022-11-07 NOTE — Assessment & Plan Note (Addendum)
Will get [place] to r/o DVT Can't really CTA at this time due to Creat of nearly 3. Will also check D.Dimer.

## 2022-11-07 NOTE — Progress Notes (Signed)
IV team is unable to provide peripheral access due to bilateral mastectomies and lymph node removals in 07/2022.  Primary RN aware.

## 2022-11-07 NOTE — Assessment & Plan Note (Addendum)
In setting of severe sepsis as above. IVF Strict intake and output Repeat BMP in AM

## 2022-11-07 NOTE — Assessment & Plan Note (Signed)
New O2 requirement, ? Of PNA

## 2022-11-07 NOTE — Assessment & Plan Note (Signed)
Hold home metformin and Mounjaro Sensitive SSI Q4H for the moment.

## 2022-11-07 NOTE — Progress Notes (Signed)
VASCULAR LAB    Bilateral lower extremity venous duplex has been performed.  See CV proc for preliminary results.   Jeanae Whitmill, RVT 11/07/2022, 5:41 PM

## 2022-11-07 NOTE — Progress Notes (Addendum)
Pharmacy Antibiotic Note  [name] is a 62 y.o. female admitted on 11/06/2022 with  febrile neutropenia .  Pharmacy has been consulted for cefepime dosing.  Pt w/ AKI; baseline SCr ~1.2, now 2.97.  Plan: Cefepime 2g IV x1 then 1g IV Q12H; monitor SCr closely for dose adjustment.  Addendum: Now to broaden coverage with vancomycin with persistent hypotension, now transferring to ICU.  Will load with vanc 2g and monitor SCr +/- vanc levels prior to redosing.  VB  6:34 AM   Height: 5\' 6"  (167.6 cm) Weight: 97.5 kg (215 lb) IBW/kg (Calculated) : 59.3  Temp (24hrs), Avg:101 F (38.3 C), Min:99.2 F (37.3 C), Max:103 F (39.4 C)  Recent Labs  Lab 11/06/22 2018  WBC 1.8*  CREATININE 2.97*  LATICACIDVEN 3.1*    Estimated Creatinine Clearance: 22.5 mL/min (A) (by C-G formula based on SCr of 2.97 mg/dL (H)).    Allergies  Allergen Reactions   Codeine Phosphate Other (See Comments)    Gi upset   Lipitor [Atorvastatin]     Joint pain   Simvastatin     REACTION: muscle \\T \ joint pains   Sulfamethoxazole Hives   Tape Rash    Adhesive. Pt states "it takes skin off"  Not even paper tape    Thank you for allowing pharmacy to be a part of this patient's care.  Vernard Gambles, PharmD, BCPS  11/07/2022 1:04 AM

## 2022-11-07 NOTE — H&P (Signed)
History and Physical    Patient: [name] ZOX:[id number] DOB: 13-Feb-1961 DOA: 11/06/2022 DOS: the patient was seen and examined on 11/07/2022 PCP: Fatima Sanger, FNP  Patient coming from: Home  Chief Complaint:  Chief Complaint  Patient presents with   Altered Mental Status   HPI: [name] is a 62 y.o. female with medical history significant of DM2, breast CA s/p mastectomy 2 months ago.  Pt currently undergoing chemotherapy with 5FU + MTX + cytoxan, looks like she most recently had these x16 days ago on 5/10.  Pt presents to ED today with cough for ~ past 24h.  AMS over past day.  She has become more confused and is oriented only to self and location. EMS was called to emergency department for confusion and she was found to be hypoxic at 65% with good [place] she was dyspneic but not in respiratory distress. She is placed on supplemental oxygen and brought to the emergency room.  In ER: Tm 103.x, total WBC 1.8k and ANC of just 400!  Review of Systems: As mentioned in the history of present illness. All other systems reviewed and are negative. Past Medical History:  Diagnosis Date   Anemia    Arthritis    Asthma    Bladder spasms    Breast cancer (HCC)    Diabetes mellitus without complication (HCC)    GERD (gastroesophageal reflux disease)    Heart murmur    Hyperlipidemia    Hypertension    Past Surgical History:  Procedure Laterality Date   BACK SURGERY  1989, 1990, 2006   BREAST BIOPSY Left 05/14/2022   [place] LT BREAST BX W LOC DEV 1ST LESION IMG BX SPEC US GUIDE 05/14/2022 [facility]   BREAST BIOPSY Right 05/14/2022   MM RT BREAST BX W LOC DEV 1ST LESION IMAGE BX SPEC STEREO GUIDE 05/14/2022 [facility]   BREAST BIOPSY Left 05/14/2022   MM LT BREAST BX W LOC DEV 1ST LESION IMAGE BX SPEC STEREO GUIDE 05/14/2022 [facility]   BREAST BIOPSY Right 05/26/2022   MM RT BREAST BX W LOC DEV 1ST LESION IMAGE BX SPEC STEREO GUIDE 05/26/2022  [facility]   BREAST BIOPSY Right 05/26/2022   MM RT BREAST BX W LOC DEV EA AD LESION IMG BX SPEC STEREO GUIDE 05/26/2022 [facility]   BREAST LUMPECTOMY  06/14/1974   bi-lat , both benign    CHOLECYSTECTOMY  06/14/1996   LEG SURGERY  06/15/2007   work related injury   PARTIAL HYSTERECTOMY     still has ovaries   PORTACATH PLACEMENT Right 08/12/2022   Procedure: INSERTION PORT-A-CATH;  Surgeon: Griselda Miner, MD;  Location: [facility];  Service: General;  Laterality: Right;   SENTINEL NODE BIOPSY Bilateral 07/19/2022   Procedure: BILATERAL SENTINEL NODE BIOPSY;  Surgeon: Griselda Miner, MD;  Location: [facility];  Service: General;  Laterality: Bilateral;   TOTAL MASTECTOMY Bilateral 07/19/2022   Procedure: BILATERAL TOTAL MASTECTOMY;  Surgeon: Griselda Miner, MD;  Location: [facility];  Service: General;  Laterality: Bilateral;   Social History:  reports that she quit smoking about 9 years ago. Her smoking use included cigarettes. She has a 18.50 pack-year smoking history. She has never used smokeless tobacco. She reports that she does not drink alcohol and does not use drugs.  Allergies  Allergen Reactions   Codeine Phosphate Other (See Comments)    Gi upset   Lipitor [Atorvastatin]     Joint pain   Simvastatin  REACTION: muscle \\T \ joint pains   Sulfamethoxazole Hives   Tape Rash    Adhesive. Pt states "it takes skin off"  Not even paper tape    Family History  Problem Relation Age of Onset   Diabetes Mother    Hypertension Mother    Heart disease Mother    Hypertension Father    Diabetes Father    Heart disease Father    Breast cancer Sister 17   Lymphoma Paternal Grandfather        dx after 73   Neuropathy Neg Hx     Prior to Admission medications   Medication Sig Start Date End Date Taking? Authorizing Provider  albuterol (VENTOLIN HFA) 108 (90 Base) MCG/ACT inhaler Inhale 2 puffs into the lungs every 6 (six) hours as needed for wheezing or shortness of breath.  07/16/20  Yes Ghimire, Werner Lean, MD  ALPRAZolam Prudy Feeler) 0.5 MG tablet Take 0.5 mg by mouth 2 (two) times daily. 02/20/15  Yes [provider]  amitriptyline (ELAVIL) 50 MG tablet Take 50 mg by mouth at bedtime.   Yes [provider]  ezetimibe (ZETIA) 10 MG tablet Take 10 mg by mouth daily.   Yes [provider]  hydrochlorothiazide (HYDRODIURIL) 12.5 MG tablet Take 12.5 mg by mouth daily.   Yes [provider]  lidocaine-prilocaine (EMLA) cream Apply to affected area once 07/29/22  Yes Serena Croissant, MD  loratadine (CLARITIN) 10 MG tablet Take 10 mg by mouth daily.   Yes [provider]  magic mouthwash w/lidocaine SOLN Take 5 mLs by mouth 4 (four) times daily as needed for mouth pain. 09/16/22  Yes Serena Croissant, MD  metFORMIN (GLUCOPHAGE-XR) 500 MG 24 hr tablet Take 1,000 mg by mouth 2 (two) times daily. 06/09/20  Yes [provider]  MOUNJARO 5 MG/0.5ML Pen Inject 5 mg into the skin once a week. Monday 11/01/22  Yes [provider]  olmesartan (BENICAR) 20 MG tablet Take 1 tablet (20 mg total) by mouth daily. Patient taking differently: Take 40 mg by mouth daily. 06/23/22  Yes Ghimire, Werner Lean, MD  oxyCODONE ER [facility] ER) 36 MG C12A Take 36 mg by mouth in the morning and at bedtime.   Yes [provider]  pantoprazole (PROTONIX) 40 MG tablet Take 40 mg by mouth daily.   Yes [provider]  Pediatric Multivitamins-Iron (FLINTSTONES PLUS EXTRA IRON PO) Take by mouth.   Yes [provider]  pregabalin (LYRICA) 100 MG capsule Take 100 mg by mouth 2 (two) times daily.   Yes [provider]  traMADol (ULTRAM) 50 MG tablet Take 1 tablet (50 mg total) by mouth every 6 (six) hours as needed. 08/12/22 08/12/23 Yes Chevis Pretty III, MD  acyclovir (ZOVIRAX) 400 MG tablet TAKE 1 TABLET BY MOUTH TWICE A DAY Patient not taking: Reported on 11/07/2022 10/18/22   Serena Croissant, MD  amLODipine (NORVASC) 10 MG tablet Take 10  mg by mouth daily. 05/05/20   [provider]  Bempedoic Acid-Ezetimibe (NEXLIZET) 180-10 MG TABS Take 1 tablet by mouth daily. Patient not taking: Reported on 09/10/2022    [provider]  ondansetron (ZOFRAN) 8 MG tablet TAKE 1 TABLET EVERY 8HRS AS NEEDED FOR NAUSEA OR VOMITING. START ON THE 3RD DAY AFTER CHEMO 10/22/22   Serena Croissant, MD  prochlorperazine (COMPAZINE) 10 MG tablet TAKE 1 TABLET BY MOUTH EVERY 6 HOURS AS NEEDED FOR NAUSEA OR VOMITING. 08/13/22   Serena Croissant, MD  promethazine (PHENERGAN) 25 MG tablet  TAKE 1 TABLET BY MOUTH EVERY 6 HOURS AS NEEDED FOR NAUSEA OR VOMITING. 10/18/22   Serena Croissant, MD    Physical Exam: Vitals:   11/06/22 2218 11/06/22 2352 11/07/22 0058 11/07/22 0100  BP: (!) 113/97 99/61 (!) 91/45   Pulse: (!) 120 (!) 105 100   Resp: 20 (!) 22 15   Temp: (!) 103 F (39.4 C) (!) 100.9 F (38.3 C) 99.2 F (37.3 C)   TempSrc: Oral Oral Oral   SpO2: 95% 95% 95%   Weight:    97.5 kg  Height:    5\' 6"  (1.676 m)   Constitutional: Ill appearing Respiratory: clear to auscultation bilaterally, no wheezing, no crackles. Normal respiratory effort. No accessory muscle use.  Cardiovascular: Tachycardic Abdomen: no tenderness, no masses palpated. No hepatosplenomegaly. Bowel sounds positive.  MSK: Slight asymmetry of lower extremities with slightly larger right lower extremity  Neurologic: Grossly non-focal, speech clear but confused. Psychiatric: Oriented to self and location only.  Data Reviewed:    Labs on Admission: I have personally reviewed following labs and imaging studies  CBC: Recent Labs  Lab 11/06/22 2018  WBC 1.8*  NEUTROABS 0.4*  HGB 10.1*  HCT 33.7*  MCV 94.1  PLT 464*   Basic Metabolic Panel: Recent Labs  Lab 11/06/22 2018  NA 130*  K 4.6  CL 95*  CO2 20*  GLUCOSE 187*  BUN 31*  CREATININE 2.97*  CALCIUM 8.7*   GFR: Estimated Creatinine Clearance: 22.5 mL/min (A) (by C-G formula based on SCr of 2.97 mg/dL  (H)). Liver Function Tests: Recent Labs  Lab 11/06/22 2018  AST 18  ALT 15  ALKPHOS 60  BILITOT 0.8  PROT 6.8  ALBUMIN 3.5    Coagulation Profile: Recent Labs  Lab 11/06/22 2018  INR 1.1    Urine analysis:    Component Value Date/Time   COLORURINE YELLOW 06/19/2022 0009   APPEARANCEUR HAZY (A) 06/19/2022 0009   LABSPEC 1.018 06/19/2022 0009   PHURINE 5.0 06/19/2022 0009   GLUCOSEU NEGATIVE 06/19/2022 0009   HGBUR MODERATE (A) 06/19/2022 0009   HGBUR negative 08/05/2009 1114   BILIRUBINUR NEGATIVE 06/19/2022 0009   KETONESUR NEGATIVE 06/19/2022 0009   PROTEINUR 30 (A) 06/19/2022 0009   UROBILINOGEN 0.2 05/22/2012 1101   NITRITE NEGATIVE 06/19/2022 0009   LEUKOCYTESUR SMALL (A) 06/19/2022 0009    Radiological Exams on Admission: DG Chest Portable 1 View  Result Date: 11/06/2022 CLINICAL DATA:  Weakness and confusion. EXAM: PORTABLE CHEST 1 VIEW COMPARISON:  August 12, 2022 FINDINGS: Stable right-sided venous Port-A-Cath positioning is seen. The heart size and mediastinal contours are within normal limits. Mild atelectasis is seen within the bilateral lung bases. There is no evidence of focal consolidation, pleural effusion or pneumothorax. Radiopaque surgical clips are seen overlying the lateral chest wall soft tissues, bilaterally. Multilevel degenerative changes are noted throughout the thoracic spine. IMPRESSION: Mild bibasilar atelectasis. Electronically Signed   By: Aram Candela M.D.   On: 11/06/2022 23:08      Assessment and Plan: * Febrile neutropenia (HCC) Pt with febrile neutropenia and severe sepsis with AKI in setting of chemotherapy. ANC of 400 (meets definition of "severe neutropenia") Sepsis pathway IVF Strict intake and output Empiric cefepime + azithromycin due to question of PNA CXR neg but pt with new O2 requirement BCx pending UA pending COVID, flu, RSV neg Dr. Leonides Schanz paged regarding possibility of giving GCSF since doesn't look like pt  got this with chemo on 5/10: Per him: "Just reviewed her  chemo orders. No GCSF built into her regimen. OK to start Granix 480 mcg q 24 hours until ANC >1.5" Ordered Granix Q24H with note to pharm to stop after ANC > 1.5 Ordered CBC daily. Tele monitor. Trend lactates.  Severe sepsis (HCC) As discussed above.  AKI (acute kidney injury) (HCC) In setting of severe sepsis as above. IVF Strict intake and output Repeat BMP in AM  Acute hypoxemic respiratory failure (HCC) New O2 requirement, ? Of PNA  Acute encephalopathy AMS is most likely delirium in setting of severe sepsis / febrile neutropenia.  Localized swelling of right lower extremity Will get [place] to r/o DVT Can't really CTA at this time due to Creat of nearly 3. Will also check D.Dimer.  Type 2 diabetes mellitus with hyperglycemia (HCC) Hold home metformin and Mounjaro Sensitive SSI Q4H for the moment.  Essential hypertension Hold home BP meds in setting of sepsis.  SYNDROME, CHRONIC PAIN Hold home oxycodone, ultram for the moment (opiates likely building up in system in setting of AKI) Using PRN IV fentanyl instead. Lyrica pending med rec completion.      Advance Care Planning:   Code Status: Full Code  Full code for now, temp intubation if needed, wouldn't want extended time on life support though, d/w family.  Consults: dw Dr. Leonides Schanz, granix ordered as above.  Family Communication: Family at bedside  Severity of Illness: The appropriate patient status for this patient is INPATIENT. Inpatient status is judged to be reasonable and necessary in order to provide the required intensity of service to ensure the patient's safety. The patient's presenting symptoms, physical exam findings, and initial radiographic and laboratory data in the context of their chronic comorbidities is felt to place them at high risk for further clinical deterioration. Furthermore, it is not anticipated that the patient will be  medically stable for discharge from the hospital within 2 midnights of admission.   * I certify that at the point of admission it is my clinical judgment that the patient will require inpatient hospital care spanning beyond 2 midnights from the point of admission due to high intensity of service, high risk for further deterioration and high frequency of surveillance required.*  Author: Hillary Bow., DO 11/07/2022 1:06 AM  For on call review [email or web address].

## 2022-11-07 NOTE — Assessment & Plan Note (Signed)
Hold home oxycodone, ultram for the moment (opiates likely building up in system in setting of AKI) Using PRN IV fentanyl instead. Lyrica pending med rec completion.

## 2022-11-07 NOTE — Consult Note (Addendum)
NAME:  Jessica DEFINO, MRN:  [id number], DOB:  December 22, 1960, LOS: 0 ADMISSION DATE:  11/06/2022, CONSULTATION DATE:  5/26 REFERRING MD:  Dr. Jacqulyn Bath, CHIEF COMPLAINT:  ams   History of Present Illness:  Patient is a 62 year old female with pertinent PMH DMT2, breast cancer s/p mastectomy, asthma, HTN, HLD presents to [facility] on 5/25 with AMS.  Patient had mastectomy 2 months ago.  Patient is currently undergoing chemotherapy with 5-FU, MTX, and Cytoxan.  Last treatment on 5/10.  Patient has been having a cough and AMS since 5/24.  On 5/25 EMS called for worsening confusion and found to be hypoxic with sats 65% on room air.  Patient placed on NRB with improvement in sats and transferred to [facility] ED.  On arrival to [facility] ED on 5/25, patient febrile 103 F, mild tachycardia, and initially normotensive.  Patient changed to nasal cannula 5 L with sats 95%.  CXR showing mild bibasilar atelectasis.  WBC 1.8 and ANC 0.4.  LA 3.1.  Cultures obtained and patient placed on broad-spectrum antibiotics.  Given IV fluids. PCT 0.9.  Patient also noted to have AKI.  Patient admitted under TRH on floors.  Throughout the night patient progressively became more hypotensive.  On 5/26 despite IV fluids patient's maps around 56.  PCCM consulted for possible need of pressors.  Pertinent  Medical History   Past Medical History:  Diagnosis Date   Anemia    Arthritis    Asthma    Bladder spasms    Breast cancer (HCC)    Diabetes mellitus without complication (HCC)    GERD (gastroesophageal reflux disease)    Heart murmur    Hyperlipidemia    Hypertension      Significant Hospital Events: Including procedures, antibiotic start and stop dates in addition to other pertinent events   5/25 patient admitted to [facility] for febrile neutropenia and sepsis  Interim History / Subjective:   Patient has received 3+ liters of fluid and continues to have low blood pressures via cuff in multiple locations. She is confused per her family  at the bedside and is lethargic.   Objective   Blood pressure (!) 86/43, pulse 91, temperature 98.2 F (36.8 C), temperature source Oral, resp. rate 18, height 5\' 6"  (1.676 m), weight 93.7 kg, last menstrual period 04/22/1997, SpO2 94 %.        Intake/Output Summary (Last 24 hours) at 11/07/2022 4098 Last data filed at 11/07/2022 1191 Gross per 24 hour  Intake 1350 ml  Output --  Net 1350 ml   Filed Weights   11/07/22 0100 11/07/22 0400  Weight: 97.5 kg 93.7 kg    Examination: General: ill appearing woman, resting in bed, no acute distress HENT: [place]/AT, moist mucous membranes Lungs: decreased air movement, no wheezing Cardiovascular: rrr, no murmurs Abdomen: soft, non-tender, obese, non-distended Extremities: warm, no edema Neuro: awake but lethargic, oriented x 2 - not to time GU: n/a  Resolved Hospital Problem list     Assessment & Plan:  Septic shock Febrile neutropenia Plan: -will transfer to icu and start on peripheral levo -continue iv fluid -cont cefepime and azithromycin, will add vancomyinc -check UA -Follow-up cultures -trend LA -Trend WBC/fever curve -Hemo-onc consulted; appreciate recs -started on granix to continue until ANC >1.5 - check random cortisol  AKI Hyperkalemia Mild hyponatremia: Likely hypovolemia related Plan: -IVF -will give dose of lokelma - bladder scan -will place foley -check renal US -Trend BMP / urinary output -Replace electrolytes as indicated -Avoid nephrotoxic agents,  ensure adequate renal perfusion - check CK level  Acute respiratory failure with hypoxia Possible PNA vs. atelectasis Plan: -Wean [place] for sats greater than 92% -Pulm toiletry -cont abx as above - repeat CXR as needed  Acute metabolic encephalopathy -Likely in setting of sepsis, hypoxia, and febrile neutropenia Plan: -Limit sedating meds -Treat sepsis as above  RLE swelling Plan: -DVT ultrasound ordered -D-dimer pending  T2DM Plan: -ssi and  cbg monitoring  HTN Plan: -Hold home antihypertensives  Chronic anemia Plan: -trend cbc  Chronic pain syndrome Plan: -Hold home meds for now w/ encephalopathy  Best Practice (right click and "Reselect all SmartList Selections" daily)   Diet/type: clear liquids DVT prophylaxis: SCD GI prophylaxis: N/A Lines: N/A Foley:  Yes, and it is still needed Code Status:  full code Last date of multidisciplinary goals of care discussion [ 5/26 spoke with patient and her aunt at bedside. She wishes to be full code.]  Labs   CBC: Recent Labs  Lab 11/06/22 2018 11/07/22 0113 11/07/22 0202  WBC 1.8*  --  1.5*  NEUTROABS 0.4*  --   --   HGB 10.1* 9.2* 8.2*  HCT 33.7* 27.0* 28.2*  MCV 94.1  --  96.2  PLT 464*  --  367    Basic Metabolic Panel: Recent Labs  Lab 11/06/22 2018 11/07/22 0113 11/07/22 0202  NA 130* 134* 134*  K 4.6 5.4* 5.4*  CL 95*  --  101  CO2 20*  --  23  GLUCOSE 187*  --  164*  BUN 31*  --  31*  CREATININE 2.97*  --  3.32*  CALCIUM 8.7*  --  7.9*   GFR: Estimated Creatinine Clearance: 19.8 mL/min (A) (by C-G formula based on SCr of 3.32 mg/dL (H)). Recent Labs  Lab 11/06/22 2018 11/07/22 0052 11/07/22 0202  PROCALCITON  --   --  0.90  WBC 1.8*  --  1.5*  LATICACIDVEN 3.1* 2.2* 1.9    Liver Function Tests: Recent Labs  Lab 11/06/22 2018 11/07/22 0202  AST 18 14*  ALT 15 15  ALKPHOS 60 49  BILITOT 0.8 0.6  PROT 6.8 5.8*  ALBUMIN 3.5 2.9*   No results for input(s): "LIPASE", "AMYLASE" in the last 168 hours. No results for input(s): "AMMONIA" in the last 168 hours.  ABG    Component Value Date/Time   HCO3 25.0 11/07/2022 0113   TCO2 27 11/07/2022 0113   ACIDBASEDEF 2.0 11/07/2022 0113   O2SAT 71 11/07/2022 0113     Coagulation Profile: Recent Labs  Lab 11/06/22 2018  INR 1.1    Cardiac Enzymes: No results for input(s): "CKTOTAL", "CKMB", "CKMBINDEX", "TROPONINI" in the last 168 hours.  HbA1C: Hgb A1c MFr Bld  Date/Time  Value Ref Range Status  10/22/2022 08:45 AM 8.1 (H) 4.8 - 5.6 % Final    Comment:    (NOTE) Pre diabetes:          5.7%-6.4%  Diabetes:              >6.4%  Glycemic control for   <7.0% adults with diabetes   06/19/2022 07:59 PM 6.5 (H) 4.8 - 5.6 % Final    Comment:    (NOTE)         Prediabetes: 5.7 - 6.4         Diabetes: >6.4         Glycemic control for adults with diabetes: <7.0     CBG: Recent Labs  Lab 11/07/22 0232 11/07/22 0348  GLUCAP 139* 143*    Review of Systems:   A 10 point review of systems was performed and otherwise negative unless stated in HPI  Past Medical History:  She,  has a past medical history of Anemia, Arthritis, Asthma, Bladder spasms, Breast cancer (HCC), Diabetes mellitus without complication (HCC), GERD (gastroesophageal reflux disease), Heart murmur, Hyperlipidemia, and Hypertension.   Surgical History:   Past Surgical History:  Procedure Laterality Date   BACK SURGERY  1989, 1990, 2006   BREAST BIOPSY Left 05/14/2022   [place] LT BREAST BX W LOC DEV 1ST LESION IMG BX SPEC US GUIDE 05/14/2022 [facility]   BREAST BIOPSY Right 05/14/2022   MM RT BREAST BX W LOC DEV 1ST LESION IMAGE BX SPEC STEREO GUIDE 05/14/2022 [facility]   BREAST BIOPSY Left 05/14/2022   MM LT BREAST BX W LOC DEV 1ST LESION IMAGE BX SPEC STEREO GUIDE 05/14/2022 [facility]   BREAST BIOPSY Right 05/26/2022   MM RT BREAST BX W LOC DEV 1ST LESION IMAGE BX SPEC STEREO GUIDE 05/26/2022 [facility]   BREAST BIOPSY Right 05/26/2022   MM RT BREAST BX W LOC DEV EA AD LESION IMG BX SPEC STEREO GUIDE 05/26/2022 [facility]   BREAST LUMPECTOMY  06/14/1974   bi-lat , both benign    CHOLECYSTECTOMY  06/14/1996   LEG SURGERY  06/15/2007   work related injury   PARTIAL HYSTERECTOMY     still has ovaries   PORTACATH PLACEMENT Right 08/12/2022   Procedure: INSERTION PORT-A-CATH;  Surgeon: Griselda Miner, MD;  Location: [facility];  Service: General;   Laterality: Right;   SENTINEL NODE BIOPSY Bilateral 07/19/2022   Procedure: BILATERAL SENTINEL NODE BIOPSY;  Surgeon: Griselda Miner, MD;  Location: [facility];  Service: General;  Laterality: Bilateral;   TOTAL MASTECTOMY Bilateral 07/19/2022   Procedure: BILATERAL TOTAL MASTECTOMY;  Surgeon: Griselda Miner, MD;  Location: [facility];  Service: General;  Laterality: Bilateral;     Social History:   reports that she quit smoking about 9 years ago. Her smoking use included cigarettes. She has a 18.50 pack-year smoking history. She has never used smokeless tobacco. She reports that she does not drink alcohol and does not use drugs.   Family History:  Her family history includes Breast cancer (age of onset: 74) in her sister; Diabetes in her father and mother; Heart disease in her father and mother; Hypertension in her father and mother; Lymphoma in her paternal grandfather. There is no history of Neuropathy.   Allergies Allergies  Allergen Reactions   Codeine Phosphate Other (See Comments)    Gi upset   Lipitor [Atorvastatin]     Joint pain   Simvastatin     REACTION: muscle \\T \ joint pains   Sulfamethoxazole Hives   Tape Rash    Adhesive. Pt states "it takes skin off"  Not even paper tape     Home Medications  Prior to Admission medications   Medication Sig Start Date End Date Taking? Authorizing Provider  albuterol (VENTOLIN HFA) 108 (90 Base) MCG/ACT inhaler Inhale 2 puffs into the lungs every 6 (six) hours as needed for wheezing or shortness of breath. 07/16/20  Yes Ghimire, Werner Lean, MD  ALPRAZolam Prudy Feeler) 0.5 MG tablet Take 0.5 mg by mouth 2 (two) times daily. 02/20/15  Yes [provider]  amitriptyline (ELAVIL) 50 MG tablet Take 50 mg by mouth at bedtime.   Yes [provider]  amLODipine (NORVASC) 10 MG tablet Take 10 mg by  mouth daily. 05/05/20  Yes [provider]  ezetimibe (ZETIA) 10 MG tablet Take 10 mg by mouth daily.   Yes [provider]   hydrochlorothiazide (HYDRODIURIL) 12.5 MG tablet Take 12.5 mg by mouth daily.   Yes [provider]  lidocaine-prilocaine (EMLA) cream Apply to affected area once 07/29/22  Yes Serena Croissant, MD  loratadine (CLARITIN) 10 MG tablet Take 10 mg by mouth daily.   Yes [provider]  magic mouthwash w/lidocaine SOLN Take 5 mLs by mouth 4 (four) times daily as needed for mouth pain. 09/16/22  Yes Serena Croissant, MD  metFORMIN (GLUCOPHAGE-XR) 500 MG 24 hr tablet Take 1,000 mg by mouth 2 (two) times daily. 06/09/20  Yes [provider]  MOUNJARO 5 MG/0.5ML Pen Inject 5 mg into the skin every 28 (twenty-eight) days. Monday 11/01/22  Yes [provider]  olmesartan (BENICAR) 20 MG tablet Take 1 tablet (20 mg total) by mouth daily. Patient taking differently: Take 40 mg by mouth daily. 06/23/22  Yes Ghimire, Werner Lean, MD  ondansetron (ZOFRAN) 8 MG tablet TAKE 1 TABLET EVERY 8HRS AS NEEDED FOR NAUSEA OR VOMITING. START ON THE 3RD DAY AFTER CHEMO Patient taking differently: Take 8 mg by mouth every 8 (eight) hours as needed for nausea or vomiting. 10/22/22  Yes Serena Croissant, MD  oxyCODONE ER (XTAMPZA ER) 36 MG C12A Take 36 mg by mouth in the morning and at bedtime.   Yes [provider]  pantoprazole (PROTONIX) 40 MG tablet Take 40 mg by mouth daily.   Yes [provider]  Pediatric Multivitamins-Iron (FLINTSTONES PLUS EXTRA IRON PO) Take 1 tablet by mouth daily.   Yes [provider]  pregabalin (LYRICA) 100 MG capsule Take 100 mg by mouth 2 (two) times daily.   Yes [provider]  promethazine (PHENERGAN) 25 MG tablet TAKE 1 TABLET BY MOUTH EVERY 6 HOURS AS NEEDED FOR NAUSEA OR VOMITING. Patient taking differently: Take 25 mg by mouth every 6 (six) hours as needed for nausea or vomiting. 10/18/22  Yes Serena Croissant, MD  traMADol (ULTRAM) 50 MG tablet Take 1 tablet (50 mg total) by mouth every 6 (six) hours as needed. 08/12/22 08/12/23 Yes Chevis Pretty III, MD  acyclovir (ZOVIRAX) 400 MG tablet TAKE 1 TABLET BY MOUTH TWICE A DAY Patient not taking: Reported on 11/07/2022 10/18/22   Serena Croissant, MD  Bempedoic Acid-Ezetimibe (NEXLIZET) 180-10 MG TABS Take 1 tablet by mouth daily. Patient not taking: Reported on 09/10/2022    [provider]     Critical care time: 50 minutes    Melody Comas, MD [facility] Office: [phone]   See Amion for personal pager PCCM on call pager [phone] until 7pm. Please call Elink 7p-7a. [phone]

## 2022-11-07 NOTE — ED Provider Notes (Addendum)
[facility] EMERGENCY DEPARTMENT AT [facility] Provider Note   CSN: [id number] Arrival date & time: 11/06/22  2158     History  Chief Complaint  Patient presents with   Altered Mental Status    [name] is a 62 y.o. female.   Altered Mental Status  Patient is a 62 year old female with past medical history significant for DM2, also breast cancer status post mastectomy 2 months ago  Patient is currently undergoing chemotherapy after cancer surgery  She is present emergency room today with cough for approximate 24 hours and altered mental status over the past day.  She has become more confused and is oriented only to self and location.  EMS was called to emergency department for confusion and she was found to be hypoxic at 65% with good Plath she was dyspneic but not in respiratory distress.  She is placed on supplemental oxygen and brought to the emergency room.  According to her she has not had any hemoptysis.  She is not describing any chest pain no nausea or vomiting.  No head injuries or loss of consciousness.  No other associated symptoms.     Home Medications Prior to Admission medications   Medication Sig Start Date End Date Taking? Authorizing Provider  albuterol (VENTOLIN HFA) 108 (90 Base) MCG/ACT inhaler Inhale 2 puffs into the lungs every 6 (six) hours as needed for wheezing or shortness of breath. 07/16/20  Yes Ghimire, Werner Lean, MD  ALPRAZolam Prudy Feeler) 0.5 MG tablet Take 0.5 mg by mouth 2 (two) times daily. 02/20/15  Yes [provider]  amitriptyline (ELAVIL) 50 MG tablet Take 50 mg by mouth at bedtime.   Yes [provider]  amLODipine (NORVASC) 10 MG tablet Take 10 mg by mouth daily. 05/05/20  Yes [provider]  ezetimibe (ZETIA) 10 MG tablet Take 10 mg by mouth daily.   Yes [provider]  hydrochlorothiazide (HYDRODIURIL) 12.5 MG tablet Take 12.5 mg by mouth daily.   Yes [provider]   lidocaine-prilocaine (EMLA) cream Apply to affected area once 07/29/22  Yes Serena Croissant, MD  loratadine (CLARITIN) 10 MG tablet Take 10 mg by mouth daily.   Yes [provider]  magic mouthwash w/lidocaine SOLN Take 5 mLs by mouth 4 (four) times daily as needed for mouth pain. 09/16/22  Yes Serena Croissant, MD  metFORMIN (GLUCOPHAGE-XR) 500 MG 24 hr tablet Take 1,000 mg by mouth 2 (two) times daily. 06/09/20  Yes [provider]  MOUNJARO 5 MG/0.5ML Pen Inject 5 mg into the skin every 28 (twenty-eight) days. Monday 11/01/22  Yes [provider]  olmesartan (BENICAR) 20 MG tablet Take 1 tablet (20 mg total) by mouth daily. Patient taking differently: Take 40 mg by mouth daily. 06/23/22  Yes Ghimire, Werner Lean, MD  ondansetron (ZOFRAN) 8 MG tablet TAKE 1 TABLET EVERY 8HRS AS NEEDED FOR NAUSEA OR VOMITING. START ON THE 3RD DAY AFTER CHEMO Patient taking differently: Take 8 mg by mouth every 8 (eight) hours as needed for nausea or vomiting. 10/22/22  Yes Serena Croissant, MD  oxyCODONE ER (XTAMPZA ER) 36 MG C12A Take 36 mg by mouth in the morning and at bedtime.   Yes [provider]  pantoprazole (PROTONIX) 40 MG tablet Take 40 mg by mouth daily.   Yes [provider]  Pediatric Multivitamins-Iron (FLINTSTONES PLUS EXTRA IRON PO) Take 1 tablet by mouth daily.   Yes [provider]  pregabalin (LYRICA) 100 MG capsule Take 100  mg by mouth 2 (two) times daily.   Yes [provider]  promethazine (PHENERGAN) 25 MG tablet TAKE 1 TABLET BY MOUTH EVERY 6 HOURS AS NEEDED FOR NAUSEA OR VOMITING. Patient taking differently: Take 25 mg by mouth every 6 (six) hours as needed for nausea or vomiting. 10/18/22  Yes Serena Croissant, MD  traMADol (ULTRAM) 50 MG tablet Take 1 tablet (50 mg total) by mouth every 6 (six) hours as needed. 08/12/22 08/12/23 Yes Chevis Pretty III, MD  acyclovir (ZOVIRAX) 400 MG tablet TAKE 1 TABLET BY MOUTH TWICE A DAY Patient not taking:  Reported on 11/07/2022 10/18/22   Serena Croissant, MD  Bempedoic Acid-Ezetimibe (NEXLIZET) 180-10 MG TABS Take 1 tablet by mouth daily. Patient not taking: Reported on 09/10/2022    [provider]      Allergies    Codeine phosphate, Lipitor [atorvastatin], Simvastatin, Sulfamethoxazole, and Tape    Review of Systems   Review of Systems  Physical Exam Updated Vital Signs BP 112/62   Pulse (!) 102   Temp 99.2 F (37.3 C) (Oral)   Resp 17   Ht 5\' 6"  (1.676 m)   Wt 97.5 kg   LMP 04/22/1997   SpO2 93%   BMI 34.70 kg/m  Physical Exam Vitals and nursing note reviewed.  Constitutional:      Appearance: She is ill-appearing.     Comments: Pleasant 62 year old female  HENT:     Head: Normocephalic and atraumatic.     Nose: Nose normal.     Mouth/Throat:     Mouth: Mucous membranes are dry.  Eyes:     General: No scleral icterus. Cardiovascular:     Rate and Rhythm: Regular rhythm. Tachycardia present.     Pulses: Normal pulses.     Heart sounds: Normal heart sounds.  Pulmonary:     Breath sounds: Rales present. No wheezing.     Comments: Tachypnea present but speaking in full sentences.  Rales in bilateral bases but notably right greater than left Abdominal:     Palpations: Abdomen is soft.     Tenderness: There is no abdominal tenderness. There is no guarding or rebound.  Musculoskeletal:     Cervical back: Normal range of motion.     Comments: Slight asymmetry of lower extremities with slightly larger right lower extremity.  No significant pitting edema of the lower extremities  Skin:    General: Skin is warm and dry.     Capillary Refill: Capillary refill takes less than 2 seconds.  Neurological:     Mental Status: She is alert. Mental status is at baseline.  Psychiatric:        Mood and Affect: Mood normal.        Behavior: Behavior normal.     ED Results / Procedures / Treatments   Labs (all labs ordered are listed, but only abnormal results are  displayed) Labs Reviewed  LACTIC ACID, PLASMA - Abnormal; Notable for the following components:      Result Value   Lactic Acid, Venous 3.1 (*)    All other components within normal limits  LACTIC ACID, PLASMA - Abnormal; Notable for the following components:   Lactic Acid, Venous 2.2 (*)    All other components within normal limits  COMPREHENSIVE METABOLIC PANEL - Abnormal; Notable for the following components:   Sodium 130 (*)    Chloride 95 (*)    CO2 20 (*)    Glucose, Bld 187 (*)    BUN 31 (*)  Creatinine, Ser 2.97 (*)    Calcium 8.7 (*)    GFR, Estimated 17 (*)    All other components within normal limits  CBC WITH DIFFERENTIAL/PLATELET - Abnormal; Notable for the following components:   WBC 1.8 (*)    RBC 3.58 (*)    Hemoglobin 10.1 (*)    HCT 33.7 (*)    RDW 21.6 (*)    Platelets 464 (*)    nRBC 2.3 (*)    Neutro Abs 0.4 (*)    nRBC 4 (*)    All other components within normal limits  CBC - Abnormal; Notable for the following components:   WBC 1.5 (*)    RBC 2.93 (*)    Hemoglobin 8.2 (*)    HCT 28.2 (*)    MCHC 29.1 (*)    RDW 21.2 (*)    nRBC 4.0 (*)    All other components within normal limits  I-STAT VENOUS BLOOD GAS, ED - Abnormal; Notable for the following components:   Sodium 134 (*)    Potassium 5.4 (*)    Calcium, Ion 1.14 (*)    HCT 27.0 (*)    Hemoglobin 9.2 (*)    All other components within normal limits  CBG MONITORING, ED - Abnormal; Notable for the following components:   Glucose-Capillary 139 (*)    All other components within normal limits  RESP PANEL BY RT-PCR (RSV, FLU A&B, COVID)  RVPGX2  CULTURE, BLOOD (ROUTINE X 2)  CULTURE, BLOOD (ROUTINE X 2)  PROTIME-INR  APTT  LACTIC ACID, PLASMA  PATHOLOGIST SMEAR REVIEW  PROTIME-INR  CORTISOL-AM, BLOOD  PROCALCITONIN  COMPREHENSIVE METABOLIC PANEL  LACTIC ACID, PLASMA  URINALYSIS, COMPLETE (UACMP) WITH MICROSCOPIC  D-DIMER, QUANTITATIVE    EKG None  Radiology DG Chest Portable  1 View  Result Date: 11/06/2022 CLINICAL DATA:  Weakness and confusion. EXAM: PORTABLE CHEST 1 VIEW COMPARISON:  August 12, 2022 FINDINGS: Stable right-sided venous Port-A-Cath positioning is seen. The heart size and mediastinal contours are within normal limits. Mild atelectasis is seen within the bilateral lung bases. There is no evidence of focal consolidation, pleural effusion or pneumothorax. Radiopaque surgical clips are seen overlying the lateral chest wall soft tissues, bilaterally. Multilevel degenerative changes are noted throughout the thoracic spine. IMPRESSION: Mild bibasilar atelectasis. Electronically Signed   By: Aram Candela M.D.   On: 11/06/2022 23:08    Procedures .Critical Care  Performed by: Gailen Shelter, PA Authorized by: Gailen Shelter, PA   Critical care provider statement:    Critical care time (minutes):  35   Critical care time was exclusive of:  Separately billable procedures and treating other patients and teaching time   Critical care was necessary to treat or prevent imminent or life-threatening deterioration of the following conditions:  Sepsis   Critical care was time spent personally by me on the following activities:  Development of treatment plan with patient or surrogate, review of old charts, re-evaluation of patient's condition, pulse oximetry, ordering and review of radiographic studies, ordering and review of laboratory studies, ordering and performing treatments and interventions, obtaining history from patient or surrogate, examination of patient and evaluation of patient's response to treatment   Care discussed with: admitting provider       Medications Ordered in ED Medications  lactated ringers infusion (has no administration in time range)  azithromycin (ZITHROMAX) 500 mg in sodium chloride 0.9 % 250 mL IVPB (0 mg Intravenous Stopped 11/07/22 0001)  sodium chloride flush (NS) 0.9 %  injection 10-40 mL (has no administration in time  range)  sodium chloride flush (NS) 0.9 % injection 10-40 mL (has no administration in time range)  Chlorhexidine Gluconate Cloth 2 % PADS 6 each (has no administration in time range)  lactated ringers infusion (has no administration in time range)  enoxaparin (LOVENOX) injection 40 mg (has no administration in time range)  acetaminophen (TYLENOL) tablet 650 mg (has no administration in time range)    Or  acetaminophen (TYLENOL) suppository 650 mg (has no administration in time range)  ondansetron (ZOFRAN) tablet 4 mg (has no administration in time range)    Or  ondansetron (ZOFRAN) injection 4 mg (has no administration in time range)  ceFEPIme (MAXIPIME) 1 g in sodium chloride 0.9 % 100 mL IVPB (has no administration in time range)  Tbo-Filgrastim (GRANIX) injection 480 mcg (480 mcg Subcutaneous Given 11/07/22 0214)  fentaNYL (SUBLIMAZE) injection 12.5-25 mcg (has no administration in time range)  insulin aspart (novoLOG) injection 0-9 Units (1 Units Subcutaneous Given 11/07/22 0254)  acetaminophen (TYLENOL) tablet 1,000 mg (1,000 mg Oral Given 11/06/22 2228)  lactated ringers bolus 1,000 mL (0 mLs Intravenous Stopped 11/07/22 0052)  lactated ringers bolus 1,000 mL (1,000 mLs Intravenous New Bag/Given 11/07/22 0056)  ceFEPIme (MAXIPIME) 2 g in sodium chloride 0.9 % 100 mL IVPB (2 g Intravenous New Bag/Given 11/07/22 0225)    ED Course/ Medical Decision Making/ A&P                             Medical Decision Making Amount and/or Complexity of Data Reviewed Labs: ordered. Radiology: ordered.  Risk OTC drugs. Prescription drug management. Decision regarding hospitalization.   This patient presents to the ED for concern of SOB, this involves a number of treatment options, and is a complaint that carries with it a high risk of complications and morbidity. A differential diagnosis was considered for the patient's symptoms which is discussed below:   The causes for shortness of breath  include but are not limited to Cardiac (AHF, pericardial effusion and tamponade, arrhythmias, ischemia, etc) Respiratory (COPD, asthma, pneumonia, pneumothorax, primary pulmonary hypertension, PE/VQ mismatch) Hematological (anemia) Neuromuscular (ALS, Guillain-Barr, etc)    Co morbidities: Discussed in HPI   Brief History:  Patient is a 62 year old female with past medical history significant for DM2, also breast cancer status post mastectomy 2 months ago  Patient is currently undergoing chemotherapy after cancer surgery  She is present emergency room today with cough for approximate 24 hours and altered mental status over the past day.  She has become more confused and is oriented only to self and location.  EMS was called to emergency department for confusion and she was found to be hypoxic at 65% with good Plath she was dyspneic but not in respiratory distress.  She is placed on supplemental oxygen and brought to the emergency room.  According to her she has not had any hemoptysis.  She is not describing any chest pain no nausea or vomiting.  No head injuries or loss of consciousness.  No other associated symptoms    EMR reviewed including pt PMHx, past surgical history and past visits to ER.   See HPI for more details   Lab Tests:   I ordered and independently interpreted labs. Labs notable for Patient is neutropenic with ANC of 0.4 I had already ordered azithromycin and Rocephin but will broaden antibiotics given neutropenic fever.  This is likely secondary to current  chemotherapy.  CMP with AKI creatinine has approximately doubled from 2 weeks ago.  VBG without elevated bicarb.  Her altered mental status is likely secondary to fever does not appear to be caused by hypercarbia.  Lactic initially elevated 3.1 improved to 2.2 with IV fluids.   Imaging Studies:  Abnormal findings. I personally reviewed all imaging studies. Imaging notable for  Chest x-ray with bilateral  atelectasis.  Cardiac Monitoring:  The patient was maintained on a cardiac monitor.  I personally viewed and interpreted the cardiac monitored which showed an underlying rhythm of: Sinus tachycardia EKG non-ischemic   Medicines ordered:  I ordered medication including Tylenol, 2 L of crystalloid, azithromycin, Rocephin, ultimately after discussion with hospitalist broad-spectrum antibiotics were also initiated for treatment of fever in the setting of neutropenia Reevaluation of the patient after these medicines showed that the patient improved I have reviewed the patients home medicines and have made adjustments as needed   Critical Interventions:   admission, antibiotics, fluid resuscitation   Consults/Attending Physician   I discussed this case with my attending physician who cosigned this note including patient's presenting symptoms, physical exam, and planned diagnostics and interventions. Attending physician stated agreement with plan or made changes to plan which were implemented.   Attending physician assessed patient at bedside.   Discussed with Laban Emperor who admitted   Reevaluation:  After the interventions noted above I re-evaluated patient and found that they have :improved   Social Determinants of Health:      Problem List / ED Course:  Patient is neutropenic likely secondary to chemotherapy presented febrile and tachycardic and hypoxic.  Patient has some bilateral crackles however significantly louder crackles in right lung base.  I have a high suspicion for pneumonia.  Less likely pulmonary embolism given high fever and history and physical exam.  Does have some slight right lower extremity greater than left lower extremity asymmetry.  COVID RSV influenza negative.  Admitted to hospital service who plans for D-dimer and lower extremity ultrasound.  Dispostion:  After consideration of the diagnostic results and the patients response to treatment, I feel  that the patent would benefit from admission   Final Clinical Impression(s) / ED Diagnoses Final diagnoses:  Altered mental status, unspecified altered mental status type  Sepsis, due to unspecified organism, unspecified whether acute organ dysfunction present [facility])  Pneumonia of right lower lobe due to infectious organism    Rx / DC Orders ED Discharge Orders     None         Gailen Shelter, [place] 11/07/22 0432    Gailen Shelter, PA 11/07/22 0981    Eber Hong, MD 11/07/22 [phone]

## 2022-11-07 NOTE — Assessment & Plan Note (Signed)
Hold home BP meds in setting of sepsis. 

## 2022-11-07 NOTE — Assessment & Plan Note (Addendum)
Pt with febrile neutropenia and severe sepsis with AKI in setting of chemotherapy. ANC of 400 (meets definition of "severe neutropenia") Sepsis pathway IVF Strict intake and output Empiric cefepime + azithromycin due to question of PNA CXR neg but pt with new O2 requirement BCx pending UA pending COVID, flu, RSV neg Dr. Leonides Schanz paged regarding possibility of giving GCSF since doesn't look like pt got this with chemo on 5/10: Per him: "Just reviewed her chemo orders. No GCSF built into her regimen. OK to start Granix 480 mcg q 24 hours until ANC >1.5" Ordered Granix Q24H with note to pharm to stop after ANC > 1.5 Ordered CBC daily. Tele monitor. Trend lactates.

## 2022-11-07 NOTE — Progress Notes (Signed)
NAME:  [name], MRN:  [id number], DOB:  1961/04/02, LOS: 0 ADMISSION DATE:  11/06/2022, CONSULTATION DATE: 11/07/2022 REFERRING MD: Dr. Jacqulyn Bath, CHIEF COMPLAINT: Altered mental status  History of Present Illness:  Patient is a 62 year old female with pertinent PMH DMT2, breast cancer s/p mastectomy, asthma, HTN, HLD presents to [facility] on 5/25 with AMS.   Patient had mastectomy 2 months ago.  Patient is currently undergoing chemotherapy with 5-FU, MTX, and Cytoxan.  Last treatment on 5/10.  Patient has been having a cough and AMS since 5/24.  On 5/25 EMS called for worsening confusion and found to be hypoxic with sats 65% on room air.  Patient placed on NRB with improvement in sats and transferred to [facility] ED.   On arrival to [facility] ED on 5/25, patient febrile 103 F, mild tachycardia, and initially normotensive.  Patient changed to nasal cannula 5 L with sats 95%.  CXR showing mild bibasilar atelectasis.  WBC 1.8 and ANC 0.4.  LA 3.1.  Cultures obtained and patient placed on broad-spectrum antibiotics.  Given IV fluids. PCT 0.9.  Patient also noted to have AKI.  Patient admitted under TRH on floors.  Throughout the night patient progressively became more hypotensive.  On 5/26 despite IV fluids patient's maps around 56.  PCCM consulted for possible need of pressors.  Pertinent  Medical History   Past Medical History:  Diagnosis Date   Anemia    Arthritis    Asthma    Bladder spasms    Breast cancer (HCC)    Diabetes mellitus without complication (HCC)    GERD (gastroesophageal reflux disease)    Heart murmur    Hyperlipidemia    Hypertension      Significant Hospital Events: Including procedures, antibiotic start and stop dates in addition to other pertinent events   5/25-admitted for febrile neutropenia and sepsis  Interim History / Subjective:  On peripheral levo, lethargic Did not respond to 3 L of fluid  Objective   Blood pressure (!) 90/44, pulse 98, temperature 100 F (37.8  C), resp. rate 17, height 5\' 6"  (1.676 m), weight 94 kg, last menstrual period 04/22/1997, SpO2 90 %.        Intake/Output Summary (Last 24 hours) at 11/07/2022 0800 Last data filed at 11/07/2022 0707 Gross per 24 hour  Intake 2553.5 ml  Output --  Net 2553.5 ml   Filed Weights   11/07/22 0100 11/07/22 0400 11/07/22 0709  Weight: 97.5 kg 93.7 kg 94 kg    Examination: General: Chronically ill-appearing, does not appear to be in distress HENT: Moist oral mucosa Lungs: Decreased air entry bilaterally, no wheezes, no rhonchi Cardiovascular: S1-S2 appreciated Abdomen: Soft, bowel sounds appreciated Extremities: No clubbing, no edema Neuro: Lethargic GU:   Resolved Hospital Problem list     Assessment & Plan:  Septic shock Febrile neutropenia -Continue Levophed -Continue IV fluids -Continue antibiotic therapy, currently on cefepime, azithromycin, vancomycin added -Continue to follow cultures negative to date -On Granix  Acute kidney injury -Received IV fluids -Lokelma given -Renal ultrasound pending -Continue to trend urinary output -Avoid nephrotoxic medications -Maintain renal perfusion -On LR at 150 -Will give another liter of fluid  Acute respiratory failure with hypoxia Atelectasis at bases -Possible pneumonia -Continue oxygen via nasal cannula -Continue antibiotics -Intermittent chest x-rays  Metabolic encephalopathy -Continue to monitor -This may be related to sepsis  Type 2 diabetes -Continue SSI  Hypertension -Hold home antihypertensives  Chronic pain syndrome Will hold home medications at present  Best  Practice (right click and "Reselect all SmartList Selections" daily)   Diet/type: Clear liquids DVT prophylaxis: SCD GI prophylaxis: N/A Lines: N/A Foley:  N/A Code Status:  full code Last date of multidisciplinary goals of care discussion [full code]  Labs   CBC: Recent Labs  Lab 11/06/22 2018 11/07/22 0113 11/07/22 0202  WBC 1.8*   --  1.5*  NEUTROABS 0.4*  --   --   HGB 10.1* 9.2* 8.2*  HCT 33.7* 27.0* 28.2*  MCV 94.1  --  96.2  PLT 464*  --  367    Basic Metabolic Panel: Recent Labs  Lab 11/06/22 2018 11/07/22 0113 11/07/22 0202  NA 130* 134* 134*  K 4.6 5.4* 5.4*  CL 95*  --  101  CO2 20*  --  23  GLUCOSE 187*  --  164*  BUN 31*  --  31*  CREATININE 2.97*  --  3.32*  CALCIUM 8.7*  --  7.9*   GFR: Estimated Creatinine Clearance: 19.8 mL/min (A) (by C-G formula based on SCr of 3.32 mg/dL (H)). Recent Labs  Lab 11/06/22 2018 11/07/22 0052 11/07/22 0202 11/07/22 0540  PROCALCITON  --   --  0.90  --   WBC 1.8*  --  1.5*  --   LATICACIDVEN 3.1* 2.2* 1.9 2.5*    Liver Function Tests: Recent Labs  Lab 11/06/22 2018 11/07/22 0202  AST 18 14*  ALT 15 15  ALKPHOS 60 49  BILITOT 0.8 0.6  PROT 6.8 5.8*  ALBUMIN 3.5 2.9*   No results for input(s): "LIPASE", "AMYLASE" in the last 168 hours. No results for input(s): "AMMONIA" in the last 168 hours.  ABG    Component Value Date/Time   HCO3 25.0 11/07/2022 0113   TCO2 27 11/07/2022 0113   ACIDBASEDEF 2.0 11/07/2022 0113   O2SAT 71 11/07/2022 0113     Coagulation Profile: Recent Labs  Lab 11/06/22 2018 11/07/22 0202  INR 1.1 1.2    Cardiac Enzymes: No results for input(s): "CKTOTAL", "CKMB", "CKMBINDEX", "TROPONINI" in the last 168 hours.  HbA1C: Hgb A1c MFr Bld  Date/Time Value Ref Range Status  10/22/2022 08:45 AM 8.1 (H) 4.8 - 5.6 % Final    Comment:    (NOTE) Pre diabetes:          5.7%-6.4%  Diabetes:              >6.4%  Glycemic control for   <7.0% adults with diabetes   06/19/2022 07:59 PM 6.5 (H) 4.8 - 5.6 % Final    Comment:    (NOTE)         Prediabetes: 5.7 - 6.4         Diabetes: >6.4         Glycemic control for adults with diabetes: <7.0     CBG: Recent Labs  Lab 11/07/22 0232 11/07/22 0348 11/07/22 0705  GLUCAP 139* 143* 98    Review of Systems:   Awake alert interactive, states she feels a  bit better  Past Medical History:  She,  has a past medical history of Anemia, Arthritis, Asthma, Bladder spasms, Breast cancer (HCC), Diabetes mellitus without complication (HCC), GERD (gastroesophageal reflux disease), Heart murmur, Hyperlipidemia, and Hypertension.   Surgical History:   Past Surgical History:  Procedure Laterality Date   BACK SURGERY  1989, 1990, 2006   BREAST BIOPSY Left 05/14/2022   [place] LT BREAST BX W LOC DEV 1ST LESION IMG BX SPEC US GUIDE 05/14/2022 [facility]   BREAST  BIOPSY Right 05/14/2022   MM RT BREAST BX W LOC DEV 1ST LESION IMAGE BX SPEC STEREO GUIDE 05/14/2022 [facility]   BREAST BIOPSY Left 05/14/2022   MM LT BREAST BX W LOC DEV 1ST LESION IMAGE BX SPEC STEREO GUIDE 05/14/2022 [facility]   BREAST BIOPSY Right 05/26/2022   MM RT BREAST BX W LOC DEV 1ST LESION IMAGE BX SPEC STEREO GUIDE 05/26/2022 [facility]   BREAST BIOPSY Right 05/26/2022   MM RT BREAST BX W LOC DEV EA AD LESION IMG BX SPEC STEREO GUIDE 05/26/2022 [facility]   BREAST LUMPECTOMY  06/14/1974   bi-lat , both benign    CHOLECYSTECTOMY  06/14/1996   LEG SURGERY  06/15/2007   work related injury   PARTIAL HYSTERECTOMY     still has ovaries   PORTACATH PLACEMENT Right 08/12/2022   Procedure: INSERTION PORT-A-CATH;  Surgeon: Griselda Miner, MD;  Location: [facility];  Service: General;  Laterality: Right;   SENTINEL NODE BIOPSY Bilateral 07/19/2022   Procedure: BILATERAL SENTINEL NODE BIOPSY;  Surgeon: Griselda Miner, MD;  Location: [facility];  Service: General;  Laterality: Bilateral;   TOTAL MASTECTOMY Bilateral 07/19/2022   Procedure: BILATERAL TOTAL MASTECTOMY;  Surgeon: Griselda Miner, MD;  Location: [facility];  Service: General;  Laterality: Bilateral;     Social History:   reports that she quit smoking about 9 years ago. Her smoking use included cigarettes. She has a 18.50 pack-year smoking history. She has never used smokeless tobacco. She reports that she does  not drink alcohol and does not use drugs.   Family History:  Her family history includes Breast cancer (age of onset: 73) in her sister; Diabetes in her father and mother; Heart disease in her father and mother; Hypertension in her father and mother; Lymphoma in her paternal grandfather. There is no history of Neuropathy.   Allergies Allergies  Allergen Reactions   Codeine Phosphate Other (See Comments)    Gi upset   Lipitor [Atorvastatin]     Joint pain   Simvastatin     REACTION: muscle \\T \ joint pains   Sulfamethoxazole Hives   Tape Rash    Adhesive. Pt states "it takes skin off"  Not even paper tape    The patient is critically ill with multiple organ systems failure and requires high complexity decision making for assessment and support, frequent evaluation and titration of therapies, application of advanced monitoring technologies and extensive interpretation of multiple databases. Critical Care Time devoted to patient care services described in this note independent of APP/resident time (if applicable)  is 35 minutes.   Virl Diamond MD [place] [facility] Personal pager: See Amion If unanswered, please page CCM On-call: #[phone]

## 2022-11-07 NOTE — Progress Notes (Signed)
Patient suddenly complained of thumping chest pain.  Vitals maintained WNL.  MD informed.  Order for EKG received.

## 2022-11-07 NOTE — Progress Notes (Addendum)
Pharmacy Antibiotic Note  [name] is a 62 y.o. female admitted on 11/06/2022 with febrile neutropenia . Pharmacy consulted for cefepime/vancomycin dosing. AKI; baseline SCr ~1.2, trending up today to 3.32.  Patient received cefepime 2g IV x 1 and vancomycin 2g IV loading dose today.  5/25 blood cultures are negative to date. /MRSA PCR negative.   Plan: F/u renal function trend and may need to consider checking vancomycin random level at 48hrs post-load prior to re-dosing Adjust cefepime to 2g IV q24h Azithromycin 500mg  IV q24h Monitor clinical progress, c/s, renal function F/u de-escalation plan/LOT   Height: 5\' 6"  (167.6 cm) Weight: 94 kg (207 lb 3.7 oz) IBW/kg (Calculated) : 59.3  Temp (24hrs), Avg:99 F (37.2 C), Min:86.2 F (30.1 C), Max:103 F (39.4 C)  Recent Labs  Lab 11/06/22 2018 11/07/22 0052 11/07/22 0202 11/07/22 0540  WBC 1.8*  --  1.5*  --   CREATININE 2.97*  --  3.32*  --   LATICACIDVEN 3.1* 2.2* 1.9 2.5*     Estimated Creatinine Clearance: 19.8 mL/min (A) (by C-G formula based on SCr of 3.32 mg/dL (H)).    Allergies  Allergen Reactions   Codeine Phosphate Other (See Comments)    Gi upset   Lipitor [Atorvastatin]     Joint pain   Simvastatin     REACTION: muscle \\T \ joint pains   Sulfamethoxazole Hives   Tape Rash    Adhesive. Pt states "it takes skin off"  Not even paper tape    Leia Alf, PharmD, BCPS Please check AMION for all [facility] Pharmacy contact numbers Clinical Pharmacist 11/07/2022 12:07 PM

## 2022-11-07 NOTE — Assessment & Plan Note (Signed)
AMS is most likely delirium in setting of severe sepsis / febrile neutropenia.

## 2022-11-07 NOTE — Assessment & Plan Note (Signed)
As discussed above 

## 2022-11-07 NOTE — Progress Notes (Signed)
ANTICOAGULATION CONSULT NOTE - Initial Consult  Pharmacy Consult for Heparin Indication: DVT  Allergies  Allergen Reactions   Codeine Phosphate Other (See Comments)    Gi upset   Lipitor [Atorvastatin]     Joint pain   Simvastatin     REACTION: muscle \\T \ joint pains   Sulfamethoxazole Hives   Tape Rash    Adhesive. Pt states "it takes skin off"  Not even paper tape    Patient Measurements: Height: 5\' 6"  (167.6 cm) Weight: 94 kg (207 lb 3.7 oz) IBW/kg (Calculated) : 59.3 Heparin Dosing Weight: 80.1 kg  Vital Signs: Temp: 100.8 F (38.2 C) (05/26 1800) Temp Source: Bladder (05/26 1800) BP: 82/48 (05/26 1800) Pulse Rate: 100 (05/26 1800)  Labs: Recent Labs    11/06/22 2018 11/06/22 2356 11/07/22 0113 11/07/22 0202 11/07/22 1407  HGB 10.1*  --  9.2* 8.2*  --   HCT 33.7*  --  27.0* 28.2*  --   PLT 464*  --   --  367  --   APTT 30  --   --   --   --   LABPROT 14.7  --   --  15.6*  --   INR 1.1  --   --  1.2  --   CREATININE 2.97*  --   --  3.32* 3.40*  CKTOTAL  --  149  --   --   --     Estimated Creatinine Clearance: 19.3 mL/min (A) (by C-G formula based on SCr of 3.4 mg/dL (H)).   Medical History: Past Medical History:  Diagnosis Date   Anemia    Arthritis    Asthma    Bladder spasms    Breast cancer (HCC)    Diabetes mellitus without complication (HCC)    GERD (gastroesophageal reflux disease)    Heart murmur    Hyperlipidemia    Hypertension     Medications:  Scheduled:   Chlorhexidine Gluconate Cloth  6 each Topical Daily   ezetimibe  10 mg Oral Daily   insulin aspart  0-9 Units Subcutaneous Q4H   melatonin  3 mg Oral QHS   oxyCODONE  30 mg Oral Q12H   sodium chloride flush  10-40 mL Intracatheter Q12H   Tbo-filgastrim (GRANIX) SQ  480 mcg Subcutaneous Q24H   vancomycin variable dose per unstable renal function (pharmacist dosing)   Does not apply See admin instructions   Infusions:   sodium chloride 10 mL/hr at 11/07/22 1800    azithromycin Stopped (11/07/22 0026)   [START ON 11/08/2022] ceFEPime (MAXIPIME) IV     lactated ringers 150 mL/hr (11/07/22 1800)   norepinephrine (LEVOPHED) Adult infusion 1 mcg/min (11/07/22 1800)   PRN: acetaminophen **OR** acetaminophen, albuterol, ondansetron **OR** ondansetron (ZOFRAN) IV, sodium chloride flush  Assessment: 62 yo female with breast cancer admitted with febrile neutropenia. Pharmacy consulted to dose IV heparin for DVT. Baseline aPTT, PT/INR WNL, Hgb low 8.2, Plts WNL. Not on anticoagulants PTA.  Goal of Therapy:  Heparin level 0.3-0.7 units/ml Monitor platelets by anticoagulation protocol: Yes   Plan:  Give 2500 units bolus x 1 Start heparin infusion at 1300 units/hr Check anti-Xa level in 8 hours and daily while on heparin Continue to monitor H&H and platelets  Loralee Pacas, PharmD, BCPS 11/07/2022,6:23 PM  Please check AMION for all [facility] Pharmacy phone numbers After 10:00 PM, call Main Pharmacy [phone]

## 2022-11-08 DIAGNOSIS — R5081 Fever presenting with conditions classified elsewhere: Secondary | ICD-10-CM | POA: Diagnosis not present

## 2022-11-08 DIAGNOSIS — D709 Neutropenia, unspecified: Secondary | ICD-10-CM | POA: Diagnosis not present

## 2022-11-08 LAB — CBC WITH DIFFERENTIAL/PLATELET
Abs Immature Granulocytes: 0 10*3/uL (ref 0.00–0.07)
Basophils Absolute: 0.1 10*3/uL (ref 0.0–0.1)
Basophils Relative: 2 %
Blasts: 3 %
Eosinophils Absolute: 0.4 10*3/uL (ref 0.0–0.5)
Eosinophils Relative: 12 %
HCT: 28.1 % — ABNORMAL LOW (ref 36.0–46.0)
Hemoglobin: 8.5 g/dL — ABNORMAL LOW (ref 12.0–15.0)
Lymphocytes Relative: 20 %
Lymphs Abs: 0.6 10*3/uL — ABNORMAL LOW (ref 0.7–4.0)
MCH: 28.3 pg (ref 26.0–34.0)
MCHC: 30.2 g/dL (ref 30.0–36.0)
MCV: 93.7 fL (ref 80.0–100.0)
Monocytes Absolute: 0.3 10*3/uL (ref 0.1–1.0)
Monocytes Relative: 9 %
Neutro Abs: 1.6 10*3/uL — ABNORMAL LOW (ref 1.7–7.7)
Neutrophils Relative %: 54 %
Platelets: 410 10*3/uL — ABNORMAL HIGH (ref 150–400)
RBC: 3 MIL/uL — ABNORMAL LOW (ref 3.87–5.11)
RDW: 21.2 % — ABNORMAL HIGH (ref 11.5–15.5)
WBC: 3 10*3/uL — ABNORMAL LOW (ref 4.0–10.5)
nRBC: 2 /100 WBC — ABNORMAL HIGH
nRBC: 2.3 % — ABNORMAL HIGH (ref 0.0–0.2)

## 2022-11-08 LAB — GLUCOSE, CAPILLARY
Glucose-Capillary: 141 mg/dL — ABNORMAL HIGH (ref 70–99)
Glucose-Capillary: 120 mg/dL — ABNORMAL HIGH (ref 70–99)
Glucose-Capillary: 135 mg/dL — ABNORMAL HIGH (ref 70–99)
Glucose-Capillary: 116 mg/dL — ABNORMAL HIGH (ref 70–99)
Glucose-Capillary: 151 mg/dL — ABNORMAL HIGH (ref 70–99)
Glucose-Capillary: 106 mg/dL — ABNORMAL HIGH (ref 70–99)

## 2022-11-08 LAB — LACTIC ACID, PLASMA: Lactic Acid, Venous: 1.6 mmol/L (ref 0.5–1.9)

## 2022-11-08 LAB — CULTURE, BLOOD (ROUTINE X 2): Special Requests: ADEQUATE

## 2022-11-08 LAB — HEPARIN LEVEL (UNFRACTIONATED)
Heparin Unfractionated: 0.16 IU/mL — ABNORMAL LOW (ref 0.30–0.70)
Heparin Unfractionated: 0.27 IU/mL — ABNORMAL LOW (ref 0.30–0.70)

## 2022-11-08 MED ORDER — LACTATED RINGERS IV SOLN: INTRAVENOUS | Status: DC

## 2022-11-08 MED ORDER — HEPARIN BOLUS VIA INFUSION
2000.0000 [IU] | Freq: Once | INTRAVENOUS | Status: AC
  Administered 2022-11-08: 2000 [IU] via INTRAVENOUS
  Filled 2022-11-08: qty 2000

## 2022-11-08 MED ORDER — ALPRAZOLAM 0.5 MG PO TABS
1.0000 mg | ORAL_TABLET | Freq: Every day | ORAL | Status: DC
  Administered 2022-11-08: 1 mg via ORAL
  Filled 2022-11-08: qty 2

## 2022-11-08 MED ORDER — MIDODRINE HCL 5 MG PO TABS
5.0000 mg | ORAL_TABLET | Freq: Three times a day (TID) | ORAL | Status: DC
  Administered 2022-11-08 – 2022-11-09 (×3): 5 mg via ORAL
  Filled 2022-11-08 (×3): qty 1

## 2022-11-08 MED ORDER — PANTOPRAZOLE SODIUM 40 MG PO TBEC
40.0000 mg | DELAYED_RELEASE_TABLET | Freq: Every day | ORAL | Status: DC
  Administered 2022-11-08 – 2022-11-13 (×6): 40 mg via ORAL
  Filled 2022-11-08 (×6): qty 1

## 2022-11-08 MED ORDER — HEPARIN BOLUS VIA INFUSION
1000.0000 [IU] | Freq: Once | INTRAVENOUS | Status: AC
  Administered 2022-11-08: 1000 [IU] via INTRAVENOUS
  Filled 2022-11-08: qty 1000

## 2022-11-08 NOTE — Progress Notes (Signed)
NAME:  [name], MRN:  [id number], DOB:  Nov 25, 1960, LOS: 1 ADMISSION DATE:  11/06/2022, CONSULTATION DATE: 11/07/2022 REFERRING MD: Dr. Jacqulyn Bath, CHIEF COMPLAINT: Altered mental status  History of Present Illness:  Patient is a 62 year old female with pertinent PMH DMT2, breast cancer s/p mastectomy, asthma, HTN, HLD presents to [facility] on 5/25 with AMS.   Patient had mastectomy 2 months ago.  Patient is currently undergoing chemotherapy with 5-FU, MTX, and Cytoxan.  Last treatment on 5/10.  Patient has been having a cough and AMS since 5/24.  On 5/25 EMS called for worsening confusion and found to be hypoxic with sats 65% on room air.  Patient placed on NRB with improvement in sats and transferred to [facility] ED.   On arrival to [facility] ED on 5/25, patient febrile 103 F, mild tachycardia, and initially normotensive.  Patient changed to nasal cannula 5 L with sats 95%.  CXR showing mild bibasilar atelectasis.  WBC 1.8 and ANC 0.4.  LA 3.1.  Cultures obtained and patient placed on broad-spectrum antibiotics.  Given IV fluids. PCT 0.9.  Patient also noted to have AKI.  Patient admitted under TRH on floors.  Throughout the night patient progressively became more hypotensive.  On 5/26 despite IV fluids patient's maps around 56.  PCCM consulted for possible need of pressors.  Pertinent  Medical History   Past Medical History:  Diagnosis Date   Anemia    Arthritis    Asthma    Bladder spasms    Breast cancer (HCC)    Diabetes mellitus without complication (HCC)    GERD (gastroesophageal reflux disease)    Heart murmur    Hyperlipidemia    Hypertension      Significant Hospital Events: Including procedures, antibiotic start and stop dates in addition to other pertinent events   5/25-admitted for febrile neutropenia and sepsis 5/27 kidney functions trending well  Interim History / Subjective:  Remains on peripheral levo, awake alert interactive, appropriate Did not sleep well last  night  Objective   Blood pressure 98/65, pulse 90, temperature 100.2 F (37.9 C), resp. rate 16, height 5\' 6"  (1.676 m), weight 94 kg, last menstrual period 04/22/1997, SpO2 98 %.        Intake/Output Summary (Last 24 hours) at 11/08/2022 0739 Last data filed at 11/08/2022 0700 Gross per 24 hour  Intake 5302.21 ml  Output 2250 ml  Net 3052.21 ml   Filed Weights   11/07/22 0100 11/07/22 0400 11/07/22 0709  Weight: 97.5 kg 93.7 kg 94 kg    Examination: Tmax 100.4 General: Chronically ill-appearing HENT: Moist oral mucosa Lungs: Decreased air entry bilaterally, clear breath sounds Cardiovascular: S1-S2 appreciated Abdomen: Soft, bowel sounds appreciated Extremities: No clubbing, no edema Neuro: Awake alert interactive, appropriate, no muscle weakness GU:   Azithromycin 5/25>> Cefepime 5/25>> Vancomycin 5/25.   Resolved Hospital Problem list     Assessment & Plan:   Septic shock Febrile neutropenia -Remains on Levophed -Will add midodrine -LR at 75 cc -Continue to monitor renal functions -BUN/creatinine stabilizing  Acute kidney injury -Did receive 3 L for shock and another liter of LR -Will add LR at 75 cc an hour -Continue to trend urine output -Avoid nephrotoxic medications  Acute respiratory failure with hypoxia Atelectasis at bases -Possible pneumonia -Continue oxygen by nasal cannula -Continue antibiotics -Will discontinue vancomycin at present -Intermittent chest x-rays  Metabolic encephalopathy Appears to be stabilizing  Type 2 diabetes -Continue SSI  Hypertension -Hold home antihypertensives  History of GERD -  Add PPI  Chronic pain syndrome  Acute respiratory failure with hypoxia Atelectasis at bases -Possible pneumonia -Continue oxygen via nasal cannula -Continue antibiotics -Intermittent chest x-rays  Metabolic encephalopathy -Continue to monitor -This may be related to sepsis  Type 2 diabetes -Continue  SSI  Hypertension -Hold home antihypertensives  Chronic pain syndrome Will hold home medications at present -OxyContin 12-hour 30 mg every 12 -Was on long-acting opiates at home  Bilateral DVT in the context of active breast cancer for which she had bilateral mastectomy -On heparin -DOAC can be considered once renal function stabilizes  Best Practice (right click and "Reselect all SmartList Selections" daily)   Diet/type: Advance diet DVT prophylaxis: On full dose anticoagulation GI prophylaxis: N/A Lines: N/A Foley:  N/A Code Status:  full code Last date of multidisciplinary goals of care discussion     Labs   CBC: Recent Labs  Lab 11/06/22 2018 11/07/22 0113 11/07/22 0202 11/08/22 0420  WBC 1.8*  --  1.5* 3.0*  NEUTROABS 0.4*  --   --  1.6*  HGB 10.1* 9.2* 8.2* 8.5*  HCT 33.7* 27.0* 28.2* 28.1*  MCV 94.1  --  96.2 93.7  PLT 464*  --  367 410*    Basic Metabolic Panel: Recent Labs  Lab 11/06/22 2018 11/07/22 0113 11/07/22 0202 11/07/22 1407  NA 130* 134* 134* 132*  K 4.6 5.4* 5.4* 5.4*  CL 95*  --  101 100  CO2 20*  --  23 24  GLUCOSE 187*  --  164* 112*  BUN 31*  --  31* 34*  CREATININE 2.97*  --  3.32* 3.40*  CALCIUM 8.7*  --  7.9* 7.7*   GFR: Estimated Creatinine Clearance: 19.3 mL/min (A) (by C-G formula based on SCr of 3.4 mg/dL (H)). Recent Labs  Lab 11/06/22 2018 11/07/22 0052 11/07/22 0202 11/07/22 0540 11/08/22 0420  PROCALCITON  --   --  0.90  --   --   WBC 1.8*  --  1.5*  --  3.0*  LATICACIDVEN 3.1* 2.2* 1.9 2.5* 1.6    Liver Function Tests: Recent Labs  Lab 11/06/22 2018 11/07/22 0202  AST 18 14*  ALT 15 15  ALKPHOS 60 49  BILITOT 0.8 0.6  PROT 6.8 5.8*  ALBUMIN 3.5 2.9*   No results for input(s): "LIPASE", "AMYLASE" in the last 168 hours. No results for input(s): "AMMONIA" in the last 168 hours.  ABG    Component Value Date/Time   HCO3 25.0 11/07/2022 0113   TCO2 27 11/07/2022 0113   ACIDBASEDEF 2.0 11/07/2022  0113   O2SAT 71 11/07/2022 0113     Coagulation Profile: Recent Labs  Lab 11/06/22 2018 11/07/22 0202  INR 1.1 1.2    Cardiac Enzymes: Recent Labs  Lab 11/06/22 2356  CKTOTAL 149    HbA1C: Hgb A1c MFr Bld  Date/Time Value Ref Range Status  10/22/2022 08:45 AM 8.1 (H) 4.8 - 5.6 % Final    Comment:    (NOTE) Pre diabetes:          5.7%-6.4%  Diabetes:              >6.4%  Glycemic control for   <7.0% adults with diabetes   06/19/2022 07:59 PM 6.5 (H) 4.8 - 5.6 % Final    Comment:    (NOTE)         Prediabetes: 5.7 - 6.4         Diabetes: >6.4         Glycemic  control for adults with diabetes: <7.0     CBG: Recent Labs  Lab 11/07/22 1527 11/07/22 1918 11/07/22 2325 11/08/22 0313 11/08/22 0720  GLUCAP 109* 143* 118* 141* 120*    Review of Systems:   Awake alert interactive, states she feels a bit better  Past Medical History:  She,  has a past medical history of Anemia, Arthritis, Asthma, Bladder spasms, Breast cancer (HCC), Diabetes mellitus without complication (HCC), GERD (gastroesophageal reflux disease), Heart murmur, Hyperlipidemia, and Hypertension.   Surgical History:   Past Surgical History:  Procedure Laterality Date   BACK SURGERY  1989, 1990, 2006   BREAST BIOPSY Left 05/14/2022   [place] LT BREAST BX W LOC DEV 1ST LESION IMG BX SPEC US GUIDE 05/14/2022 [facility]   BREAST BIOPSY Right 05/14/2022   MM RT BREAST BX W LOC DEV 1ST LESION IMAGE BX SPEC STEREO GUIDE 05/14/2022 [facility]   BREAST BIOPSY Left 05/14/2022   MM LT BREAST BX W LOC DEV 1ST LESION IMAGE BX SPEC STEREO GUIDE 05/14/2022 [facility]   BREAST BIOPSY Right 05/26/2022   MM RT BREAST BX W LOC DEV 1ST LESION IMAGE BX SPEC STEREO GUIDE 05/26/2022 [facility]   BREAST BIOPSY Right 05/26/2022   MM RT BREAST BX W LOC DEV EA AD LESION IMG BX SPEC STEREO GUIDE 05/26/2022 [facility]   BREAST LUMPECTOMY  06/14/1974   bi-lat , both benign     CHOLECYSTECTOMY  06/14/1996   LEG SURGERY  06/15/2007   work related injury   PARTIAL HYSTERECTOMY     still has ovaries   PORTACATH PLACEMENT Right 08/12/2022   Procedure: INSERTION PORT-A-CATH;  Surgeon: Griselda Miner, MD;  Location: [facility];  Service: General;  Laterality: Right;   SENTINEL NODE BIOPSY Bilateral 07/19/2022   Procedure: BILATERAL SENTINEL NODE BIOPSY;  Surgeon: Griselda Miner, MD;  Location: [facility];  Service: General;  Laterality: Bilateral;   TOTAL MASTECTOMY Bilateral 07/19/2022   Procedure: BILATERAL TOTAL MASTECTOMY;  Surgeon: Griselda Miner, MD;  Location: [facility] OR;  Service: General;  Laterality: Bilateral;     Social History:   reports that she quit smoking about 9 years ago. Her smoking use included cigarettes. She has a 18.50 pack-year smoking history. She has never used smokeless tobacco. She reports that she does not drink alcohol and does not use drugs.   Family History:  Her family history includes Breast cancer (age of onset: 50) in her sister; Diabetes in her father and mother; Heart disease in her father and mother; Hypertension in her father and mother; Lymphoma in her paternal grandfather. There is no history of Neuropathy.   Allergies Allergies  Allergen Reactions   Codeine Phosphate Other (See Comments)    Gi upset   Lipitor [Atorvastatin]     Joint pain   Simvastatin     REACTION: muscle \\T \ joint pains   Sulfamethoxazole Hives   Tape Rash    Adhesive. Pt states "it takes skin off"  Not even paper tape    The patient is critically ill with multiple organ systems failure and requires high complexity decision making for assessment and support, frequent evaluation and titration of therapies, application of advanced monitoring technologies and extensive interpretation of multiple databases. Critical Care Time devoted to patient care services described in this note independent of APP/resident time (if applicable)  is 40 minutes.   Virl Diamond  MD [place] [facility] Personal pager: See Amion If unanswered, please page CCM On-call: #[phone]

## 2022-11-08 NOTE — Progress Notes (Incomplete)
eLink Physician-Brief Progress Note Patient Name: [name] DOB: 07/16/1960 MRN: [id number]   Date of Service  11/08/2022  HPI/Events of Note  62 year old female in ICU with febrile neutropenia, altered mental status, lung infiltrates, acute metabolic encephalopathy among other things.  She was apparently complaining of chest pain earlier today.  eICU Interventions          Carilyn Goodpasture 11/08/2022, 12:00 AM

## 2022-11-08 NOTE — Progress Notes (Signed)
RN called into to assess a "bug" found on patient. RN found what seemed to a tick on L upper inner thigh. Bug removed and flushed down the toilet. MD made aware.

## 2022-11-08 NOTE — TOC CM/SW Note (Signed)
Transition of Care [facility]) - Inpatient Brief Assessment   Patient Details  Name: [name] MRN: [id number] Date of Birth: 09-11-1960  Transition of Care [facility]) CM/SW Contact:    Mearl Latin, LCSW Phone Number: 11/08/2022, 2:21 PM   Clinical Narrative: Patient from home with spouse and undergoing chemotherapy outpatient. Currently on 2L oxygen. TOC to follow for any discharge needs.    Transition of Care Asessment: Insurance and Status: Insurance coverage has been reviewed Patient has primary care physician: Yes Home environment has been reviewed: From home Prior level of function:: Independent Prior/Current Home Services: No current home services Social Determinants of Health Reivew: SDOH reviewed no interventions necessary Readmission risk has been reviewed: Yes Transition of care needs: no transition of care needs at this time

## 2022-11-08 NOTE — Progress Notes (Signed)
eLink Physician-Brief Progress Note Patient Name: [name] DOB: 1960/06/17 MRN: [id number]   Date of Service  11/08/2022  HPI/Events of Note  62 year old female in ICU with febrile neutropenia, altered mental status, lung infiltrates, acute metabolic encephalopathy among other things.  She was apparently complaining of chest pain earlier today.  EKG without acute changes.  On alprazolam and she received 0.5 mg earlier this evening.  Complaining of anxiety.  eICU Interventions  Camera exam done.  Denies feeling anxious at this time.  Family at bedside.  With her body habitus, any form of respiratory depression could lead to hypercarbia with bad outcomes.  Will hold off on giving any more alprazolam.  Will give her 5 mg of melatonin to help her sleep.  Discussed plan with patient who is okay with it.  Updated her nurse by phone.        Chevelle Durr 11/08/2022, 12:00 AM

## 2022-11-08 NOTE — Progress Notes (Signed)
eLink Physician-Brief Progress Note Patient Name: [name] DOB: 1961/02/15 MRN: [id number]   Date of Service  11/08/2022  HPI/Events of Note  Notified by RN that a tick was removed from the patient's inner thigh.  Pt on chemotherapy with febrile neutropenia.   eICU Interventions  Tick unfortunately was discarded. Rn notified to send for ID if another one is found.  Send spotted fever group antibody.      Intervention Category Intermediate Interventions: Other:  Larinda Buttery 11/08/2022, 8:57 PM

## 2022-11-08 NOTE — Progress Notes (Signed)
ANTICOAGULATION CONSULT NOTE - Follow Up Consult  Pharmacy Consult for heparin Indication: DVT  Labs: Recent Labs    11/06/22 2018 11/06/22 2356 11/07/22 0113 11/07/22 0202 11/07/22 1407 11/08/22 0400 11/08/22 0420  HGB 10.1*  --  9.2* 8.2*  --   --  8.5*  HCT 33.7*  --  27.0* 28.2*  --   --  28.1*  PLT 464*  --   --  367  --   --  410*  APTT 30  --   --   --   --   --   --   LABPROT 14.7  --   --  15.6*  --   --   --   INR 1.1  --   --  1.2  --   --   --   HEPARINUNFRC  --   --   --   --   --  0.16*  --   CREATININE 2.97*  --   --  3.32* 3.40*  --   --   CKTOTAL  --  149  --   --   --   --   --     Assessment: 62yo female subtherapeutic on heparin with initial dosing for DVT; no infusion issues or signs of bleeding per RN.  Goal of Therapy:  Heparin level 0.3-0.7 units/ml   Plan:  2000 units heparin bolus. Increase heparin infusion by 3 units/kg/hr to 1600 units/hr. Check level in 8 hours.   Vernard Gambles, PharmD, BCPS 11/08/2022 6:34 AM

## 2022-11-08 NOTE — Progress Notes (Signed)
ANTICOAGULATION CONSULT NOTE - Follow Up Consult  Pharmacy Consult for heparin Indication: DVT  Labs: Recent Labs    11/06/22 2018 11/06/22 2356 11/07/22 0113 11/07/22 0202 11/07/22 1407 11/08/22 0400 11/08/22 0420 11/08/22 1511  HGB 10.1*  --  9.2* 8.2*  --   --  8.5*  --   HCT 33.7*  --  27.0* 28.2*  --   --  28.1*  --   PLT 464*  --   --  367  --   --  410*  --   APTT 30  --   --   --   --   --   --   --   LABPROT 14.7  --   --  15.6*  --   --   --   --   INR 1.1  --   --  1.2  --   --   --   --   HEPARINUNFRC  --   --   --   --   --  0.16*  --  0.27*  CREATININE 2.97*  --   --  3.32* 3.40*  --   --   --   CKTOTAL  --  149  --   --   --   --   --   --      Assessment: 62yo female subtherapeutic on heparin with initial dosing for DVT; no infusion issues or signs of bleeding per RN. Level 0.27 this evening. Heparin currently running at 1600 units/hour.   Goal of Therapy:  Heparin level 0.3-0.7 units/ml   Plan:  Give 1000 units heparin bolus  Increase heparin infusion by 2 units/kg/hr to 1750 units/hr. Check level in 8 hours.   Blane Ohara, PharmD  PGY1 Pharmacy Resident

## 2022-11-09 DIAGNOSIS — A419 Sepsis, unspecified organism: Secondary | ICD-10-CM | POA: Diagnosis not present

## 2022-11-09 DIAGNOSIS — J9601 Acute respiratory failure with hypoxia: Secondary | ICD-10-CM | POA: Diagnosis not present

## 2022-11-09 DIAGNOSIS — D709 Neutropenia, unspecified: Secondary | ICD-10-CM | POA: Diagnosis not present

## 2022-11-09 DIAGNOSIS — G9340 Encephalopathy, unspecified: Secondary | ICD-10-CM | POA: Diagnosis not present

## 2022-11-09 LAB — BASIC METABOLIC PANEL
Anion gap: 8 (ref 5–15)
BUN: 22 mg/dL (ref 8–23)
CO2: 25 mmol/L (ref 22–32)
Calcium: 7.9 mg/dL — ABNORMAL LOW (ref 8.9–10.3)
Chloride: 102 mmol/L (ref 98–111)
Creatinine, Ser: 2.13 mg/dL — ABNORMAL HIGH (ref 0.44–1.00)
GFR, Estimated: 25 mL/min — ABNORMAL LOW (ref >60)
Glucose, Bld: 121 mg/dL — ABNORMAL HIGH (ref 70–99)
Potassium: 5.1 mmol/L (ref 3.5–5.1)
Sodium: 135 mmol/L (ref 135–145)

## 2022-11-09 LAB — CULTURE, BLOOD (ROUTINE X 2)
Culture: NO GROWTH
Culture: NO GROWTH

## 2022-11-09 LAB — HEPARIN LEVEL (UNFRACTIONATED)
Heparin Unfractionated: 0.2 IU/mL — ABNORMAL LOW (ref 0.30–0.70)
Heparin Unfractionated: 0.25 IU/mL — ABNORMAL LOW (ref 0.30–0.70)

## 2022-11-09 LAB — CBC WITH DIFFERENTIAL/PLATELET
Abs Immature Granulocytes: 0 10*3/uL (ref 0.00–0.07)
Basophils Absolute: 0.1 10*3/uL (ref 0.0–0.1)
Basophils Relative: 1 %
Eosinophils Absolute: 1.1 10*3/uL — ABNORMAL HIGH (ref 0.0–0.5)
Eosinophils Relative: 17 %
HCT: 27.8 % — ABNORMAL LOW (ref 36.0–46.0)
Hemoglobin: 8.5 g/dL — ABNORMAL LOW (ref 12.0–15.0)
Lymphocytes Relative: 24 %
Lymphs Abs: 1.5 10*3/uL (ref 0.7–4.0)
MCH: 28.5 pg (ref 26.0–34.0)
MCHC: 30.6 g/dL (ref 30.0–36.0)
MCV: 93.3 fL (ref 80.0–100.0)
Monocytes Absolute: 0.6 10*3/uL (ref 0.1–1.0)
Monocytes Relative: 9 %
Neutro Abs: 3 10*3/uL (ref 1.7–7.7)
Neutrophils Relative %: 49 %
Platelets: 415 10*3/uL — ABNORMAL HIGH (ref 150–400)
RBC: 2.98 MIL/uL — ABNORMAL LOW (ref 3.87–5.11)
RDW: 21.2 % — ABNORMAL HIGH (ref 11.5–15.5)
WBC: 6.2 10*3/uL (ref 4.0–10.5)
nRBC: 0.8 % — ABNORMAL HIGH (ref 0.0–0.2)
nRBC: 1 /100 WBC — ABNORMAL HIGH

## 2022-11-09 LAB — TROPONIN I (HIGH SENSITIVITY)
Troponin I (High Sensitivity): 9 ng/L (ref <18)
Troponin I (High Sensitivity): 8 ng/L (ref <18)

## 2022-11-09 LAB — GLUCOSE, CAPILLARY
Glucose-Capillary: 92 mg/dL (ref 70–99)
Glucose-Capillary: 127 mg/dL — ABNORMAL HIGH (ref 70–99)
Glucose-Capillary: 152 mg/dL — ABNORMAL HIGH (ref 70–99)
Glucose-Capillary: 139 mg/dL — ABNORMAL HIGH (ref 70–99)
Glucose-Capillary: 130 mg/dL — ABNORMAL HIGH (ref 70–99)

## 2022-11-09 LAB — PATHOLOGIST SMEAR REVIEW

## 2022-11-09 MED ORDER — HEPARIN BOLUS VIA INFUSION
1000.0000 [IU] | Freq: Once | INTRAVENOUS | Status: DC
  Filled 2022-11-09: qty 1000

## 2022-11-09 MED ORDER — APIXABAN 5 MG PO TABS: 5.0000 mg | ORAL_TABLET | Freq: Two times a day (BID) | ORAL | Status: DC

## 2022-11-09 MED ORDER — INSULIN ASPART 100 UNIT/ML IJ SOLN
0.0000 [IU] | Freq: Three times a day (TID) | INTRAMUSCULAR | Status: DC
  Administered 2022-11-09: 1 [IU] via SUBCUTANEOUS
  Administered 2022-11-09: 2 [IU] via SUBCUTANEOUS
  Administered 2022-11-10 – 2022-11-11 (×4): 1 [IU] via SUBCUTANEOUS

## 2022-11-09 MED ORDER — APIXABAN 5 MG PO TABS
10.0000 mg | ORAL_TABLET | Freq: Two times a day (BID) | ORAL | Status: DC
  Administered 2022-11-09 – 2022-11-13 (×9): 10 mg via ORAL
  Filled 2022-11-09 (×4): qty 2
  Filled 2022-11-09: qty 4
  Filled 2022-11-09 (×6): qty 2

## 2022-11-09 MED ORDER — ALPRAZOLAM 0.5 MG PO TABS
0.5000 mg | ORAL_TABLET | Freq: Two times a day (BID) | ORAL | Status: DC | PRN
  Administered 2022-11-09 – 2022-11-10 (×3): 0.5 mg via ORAL
  Filled 2022-11-09 (×3): qty 1

## 2022-11-09 MED ORDER — DOXYCYCLINE HYCLATE 100 MG PO TABS
100.0000 mg | ORAL_TABLET | Freq: Two times a day (BID) | ORAL | Status: DC
  Administered 2022-11-09 – 2022-11-13 (×9): 100 mg via ORAL
  Filled 2022-11-09 (×10): qty 1

## 2022-11-09 MED ORDER — SENNOSIDES-DOCUSATE SODIUM 8.6-50 MG PO TABS
1.0000 | ORAL_TABLET | Freq: Every day | ORAL | Status: DC
  Administered 2022-11-09 – 2022-11-12 (×4): 1 via ORAL
  Filled 2022-11-09 (×4): qty 1

## 2022-11-09 MED ORDER — SODIUM CHLORIDE 0.9 % IV SOLN
2.0000 g | Freq: Two times a day (BID) | INTRAVENOUS | Status: AC
  Administered 2022-11-09 – 2022-11-11 (×5): 2 g via INTRAVENOUS
  Filled 2022-11-09 (×5): qty 12.5

## 2022-11-09 NOTE — Plan of Care (Signed)
  Problem: Fluid Volume: Goal: Hemodynamic stability will improve Outcome: Progressing   Problem: Clinical Measurements: Goal: Diagnostic test results will improve Outcome: Progressing   Problem: Coping: Goal: Ability to adjust to condition or change in health will improve Outcome: Progressing   Problem: Health Behavior/Discharge Planning: Goal: Ability to identify and utilize available resources and services will improve Outcome: Progressing Goal: Ability to manage health-related needs will improve Outcome: Progressing   Problem: Education: Goal: Knowledge of General Education information will improve Description: Including pain rating scale, medication(s)/side effects and non-pharmacologic comfort measures Outcome: Progressing   Problem: Health Behavior/Discharge Planning: Goal: Ability to manage health-related needs will improve Outcome: Progressing

## 2022-11-09 NOTE — Progress Notes (Signed)
Verbal order via secure chat per Cheri Fowler MS: Ok to D/C indwelling urinary catheter. Ok to D/C Neutropenic precautions.  Orders entered. Foley removed.  Pt tolerated well.

## 2022-11-09 NOTE — Progress Notes (Signed)
NAME:  [name], MRN:  [id number], DOB:  Nov 01, 1960, LOS: 2 ADMISSION DATE:  11/06/2022, CONSULTATION DATE: 11/07/2022 REFERRING MD: Dr. Jacqulyn Bath, CHIEF COMPLAINT: Altered mental status  History of Present Illness:  Patient is a 62 year old female with pertinent PMH DMT2, breast cancer s/p mastectomy, asthma, HTN, HLD presents to [facility] on 5/25 with AMS.   Patient had mastectomy 2 months ago.  Patient is currently undergoing chemotherapy with 5-FU, MTX, and Cytoxan.  Last treatment on 5/10.  Patient has been having a cough and AMS since 5/24.  On 5/25 EMS called for worsening confusion and found to be hypoxic with sats 65% on room air.  Patient placed on NRB with improvement in sats and transferred to [facility] ED.   On arrival to [facility] ED on 5/25, patient febrile 103 F, mild tachycardia, and initially normotensive.  Patient changed to nasal cannula 5 L with sats 95%.  CXR showing mild bibasilar atelectasis.  WBC 1.8 and ANC 0.4.  LA 3.1.  Cultures obtained and patient placed on broad-spectrum antibiotics.  Given IV fluids. PCT 0.9.  Patient also noted to have AKI.  Patient admitted under TRH on floors.  Throughout the night patient progressively became more hypotensive.  On 5/26 despite IV fluids patient's maps around 56.  PCCM consulted for possible need of pressors.  Pertinent  Medical History   Past Medical History:  Diagnosis Date   Anemia    Arthritis    Asthma    Bladder spasms    Breast cancer (HCC)    Diabetes mellitus without complication (HCC)    GERD (gastroesophageal reflux disease)    Heart murmur    Hyperlipidemia    Hypertension      Significant Hospital Events: Including procedures, antibiotic start and stop dates in addition to other pertinent events   5/25-admitted for febrile neutropenia and sepsis 5/27 kidney functions improving  Interim History / Subjective:  Patient stated could not sleep overnight Came off of vasopressor support this morning, denies nausea,  vomiting or other complaints Became afebrile  Objective   Blood pressure (!) 151/81, pulse 92, temperature 99.7 F (37.6 C), temperature source Bladder, resp. rate 19, height 5\' 6"  (1.676 m), weight 94 kg, last menstrual period 04/22/1997, SpO2 96 %.        Intake/Output Summary (Last 24 hours) at 11/09/2022 1113 Last data filed at 11/09/2022 0700 Gross per 24 hour  Intake 2515.11 ml  Output 3400 ml  Net -884.89 ml   Filed Weights   11/07/22 0100 11/07/22 0400 11/07/22 0709  Weight: 97.5 kg 93.7 kg 94 kg    Examination: Physical exam: General: Elderly obese female, lying on the bed HEENT: [place]/AT, eyes anicteric.  moist mucus membranes Neuro: Alert, awake following commands Chest: Coarse breath sounds, no wheezes or rhonchi Heart: Regular rate and rhythm, no murmurs or gallops Abdomen: Soft, nontender, nondistended, bowel sounds present Skin: No rash  Labs and images were reviewed  Resolved Hospital Problem list    Septic shock Hyponatremia  Assessment & Plan:  Septic shock likely due to pneumonia Febrile neutropenia Shock improved, patient came off of vasopressor support Will discontinue midodrine and IV fluid White count improved to 6K She became afebrile She received 3 days of filgrastim  Continue antibiotics with cefepime  Possible tickborne illness Patient was noted to have thick on inner side of thigh which was removed overnight Switch azithromycin to doxycycline Tickborne illness antibodies are pending  Acute kidney injury due to septic ATN Serum creatinine continue  to improve Monitor intake and output Avoid nephrotoxic agents  Acute respiratory failure with hypoxia, improving Possible pneumonia Oxygen was titrated off this morning Continue antibiotics Encourage incentive spirometry  Acute septic encephalopathy Patient presented with altered mental status, mental status has improved with treatment of sepsis Continue supportive care  Type 2  diabetes, poorly controlled Patient hemoglobin A1c is 8.1 Continue sliding scale insulin CBG goal 140-180 Hold oral hypoglycemic agents  Hypertension Hold home antihypertensives  History of GERD Continue PPI  Bilateral DVT in the context of active breast cancer for which she had bilateral mastectomy Switch IV heparin back to apixaban  Anemia of chronic disease H&H remained stable, closely monitor  Best Practice (right click and "Reselect all SmartList Selections" daily)   Diet/type: Advance diet DVT prophylaxis: On full dose anticoagulation GI prophylaxis: PPI Lines: N/A Foley:  N/A Code Status:  full code Last date of multidisciplinary goals of care discussion: 5/28: Patient and her family were updated at bedside, decision was to continue full scope of care    Labs   CBC: Recent Labs  Lab 11/06/22 2018 11/07/22 0113 11/07/22 0202 11/08/22 0420 11/09/22 0217  WBC 1.8*  --  1.5* 3.0* 6.2  NEUTROABS 0.4*  --   --  1.6* 3.0  HGB 10.1* 9.2* 8.2* 8.5* 8.5*  HCT 33.7* 27.0* 28.2* 28.1* 27.8*  MCV 94.1  --  96.2 93.7 93.3  PLT 464*  --  367 410* 415*    Basic Metabolic Panel: Recent Labs  Lab 11/06/22 2018 11/07/22 0113 11/07/22 0202 11/07/22 1407 11/09/22 0217  NA 130* 134* 134* 132* 135  K 4.6 5.4* 5.4* 5.4* 5.1  CL 95*  --  101 100 102  CO2 20*  --  23 24 25   GLUCOSE 187*  --  164* 112* 121*  BUN 31*  --  31* 34* 22  CREATININE 2.97*  --  3.32* 3.40* 2.13*  CALCIUM 8.7*  --  7.9* 7.7* 7.9*   GFR: Estimated Creatinine Clearance: 30.8 mL/min (A) (by C-G formula based on SCr of 2.13 mg/dL (H)). Recent Labs  Lab 11/06/22 2018 11/07/22 0052 11/07/22 0202 11/07/22 0540 11/08/22 0420 11/09/22 0217  PROCALCITON  --   --  0.90  --   --   --   WBC 1.8*  --  1.5*  --  3.0* 6.2  LATICACIDVEN 3.1* 2.2* 1.9 2.5* 1.6  --     Liver Function Tests: Recent Labs  Lab 11/06/22 2018 11/07/22 0202  AST 18 14*  ALT 15 15  ALKPHOS 60 49  BILITOT 0.8 0.6   PROT 6.8 5.8*  ALBUMIN 3.5 2.9*   No results for input(s): "LIPASE", "AMYLASE" in the last 168 hours. No results for input(s): "AMMONIA" in the last 168 hours.  ABG    Component Value Date/Time   HCO3 25.0 11/07/2022 0113   TCO2 27 11/07/2022 0113   ACIDBASEDEF 2.0 11/07/2022 0113   O2SAT 71 11/07/2022 0113     Coagulation Profile: Recent Labs  Lab 11/06/22 2018 11/07/22 0202  INR 1.1 1.2    Cardiac Enzymes: Recent Labs  Lab 11/06/22 2356  CKTOTAL 149    HbA1C: Hgb A1c MFr Bld  Date/Time Value Ref Range Status  10/22/2022 08:45 AM 8.1 (H) 4.8 - 5.6 % Final    Comment:    (NOTE) Pre diabetes:          5.7%-6.4%  Diabetes:              >6.4%  Glycemic control for   <7.0% adults with diabetes   06/19/2022 07:59 PM 6.5 (H) 4.8 - 5.6 % Final    Comment:    (NOTE)         Prediabetes: 5.7 - 6.4         Diabetes: >6.4         Glycemic control for adults with diabetes: <7.0     CBG: Recent Labs  Lab 11/08/22 1555 11/08/22 1911 11/08/22 2308 11/09/22 0318 11/09/22 0723  GLUCAP 116* 151* 106* 92 127*     Cheri Fowler, MD [facility] See Amion for pager If no response to pager, please call [phone] until 7pm After 7pm, Please call E-link [phone]

## 2022-11-09 NOTE — Evaluation (Signed)
Physical Therapy Evaluation Patient Details Name: [name] MRN: [id number] DOB: February 15, 1961 Today's Date: 11/09/2022  History of Present Illness  62 y.o. female presents to [facility] on 11/06/2022 with cough, AMS, found to be hypoxic. Pt developed septic shock and febrile neutropenia, requiring pressors. PMH includes DMII, breast CA s/p mastectomy undergoing chemo, OA, asthma, GERD, HLD, HTN.  Clinical Impression  Pt presents to PT with deficits in strength, gait, mobility, balance, power, endurance. Pt is mobilizing relatively well, ambulating short distances in hospital room with support of RW. Pt appears cognitively impaired, with poor memory and reduced awareness of medical needs at this time. Pt will benefit from frequent mobilization in an effort to restore independence. PT recommends discharge home with HHPT when medically ready. DME will be better determined in future PT sessions.       Recommendations for follow up therapy are one component of a multi-disciplinary discharge planning process, led by the attending physician.  Recommendations may be updated based on patient status, additional functional criteria and insurance authorization.  Follow Up Recommendations       Assistance Recommended at Discharge PRN  Patient can return home with the following  A little help with walking and/or transfers;A little help with bathing/dressing/bathroom;Assistance with cooking/housework;Assist for transportation;Help with stairs or ramp for entrance;Direct supervision/assist for medications management;Direct supervision/assist for financial management    Equipment Recommendations  (TBD pending progress)  Recommendations for Other Services       Functional Status Assessment Patient has had a recent decline in their functional status and demonstrates the ability to make significant improvements in function in a reasonable and predictable amount of time.     Precautions / Restrictions  Precautions Precautions: Fall Precaution Comments: neutropenic Restrictions Weight Bearing Restrictions: No      Mobility  Bed Mobility Overal bed mobility: Needs Assistance Bed Mobility: Supine to Sit, Sit to Supine     Supine to sit: Supervision Sit to supine: Supervision        Transfers Overall transfer level: Needs assistance Equipment used: Rolling walker (2 wheels) Transfers: Sit to/from Stand Sit to Stand: Supervision                Ambulation/Gait Ambulation/Gait assistance: Supervision Gait Distance (Feet): 60 Feet Assistive device: Rolling walker (2 wheels) Gait Pattern/deviations: Step-through pattern Gait velocity: reduced Gait velocity interpretation: <1.8 ft/sec, indicate of risk for recurrent falls   General Gait Details: slowed step-through gait  Stairs            Wheelchair Mobility    Modified Rankin (Stroke Patients Only)       Balance Overall balance assessment: Needs assistance Sitting-balance support: No upper extremity supported, Feet supported Sitting balance-Leahy Scale: Good     Standing balance support: Single extremity supported, Reliant on assistive device for balance Standing balance-Leahy Scale: Poor                               Pertinent Vitals/Pain Pain Assessment Pain Assessment: No/denies pain    Home Living Family/patient expects to be discharged to:: Private residence Living Arrangements: Spouse/significant other Available Help at Discharge: Family;Available 24 hours/day Type of Home: House Home Access: Stairs to enter Entrance Stairs-Rails: None Entrance Stairs-Number of Steps: 4   Home Layout: One level Home Equipment: Shower seat - built in (bed rail)      Prior Function Prior Level of Function : Independent/Modified Independent;Driving  Hand Dominance   Dominant Hand: Right    Extremity/Trunk Assessment   Upper Extremity Assessment Upper  Extremity Assessment: Overall WFL for tasks assessed    Lower Extremity Assessment Lower Extremity Assessment: Generalized weakness    Cervical / Trunk Assessment Cervical / Trunk Assessment: Normal  Communication   Communication: No difficulties  Cognition Arousal/Alertness: Awake/alert Behavior During Therapy: WFL for tasks assessed/performed Overall Cognitive Status: Impaired/Different from baseline Area of Impairment: Memory, Problem solving, Awareness, Safety/judgement                     Memory: Decreased short-term memory   Safety/Judgement: Decreased awareness of safety Awareness: Emergent Problem Solving: Slow processing          General Comments General comments (skin integrity, edema, etc.): pt on 4L [place] upon PT arrival, PT weans pt to [facility] with desat to 87% at end of mobility. PT returns pt to 4L [place] at end of session    Exercises     Assessment/Plan    PT Assessment Patient needs continued PT services  PT Problem List Decreased strength;Decreased activity tolerance;Decreased balance;Decreased mobility;Decreased knowledge of use of DME;Decreased safety awareness;Decreased knowledge of precautions       PT Treatment Interventions DME instruction;Gait training;Stair training;Functional mobility training;Therapeutic activities;Therapeutic exercise;Balance training;Neuromuscular re-education;Patient/family education    PT Goals (Current goals can be found in the Care Plan section)  Acute Rehab PT Goals Patient Stated Goal: to go home PT Goal Formulation: With patient Time For Goal Achievement: 11/23/22 Potential to Achieve Goals: Good    Frequency Min 3X/week     Co-evaluation               AM-PAC PT "6 Clicks" Mobility  Outcome Measure Help needed turning from your back to your side while in a flat bed without using bedrails?: A Little Help needed moving from lying on your back to sitting on the side of a flat bed without using bedrails?:  A Little Help needed moving to and from a bed to a chair (including a wheelchair)?: A Little Help needed standing up from a chair using your arms (e.g., wheelchair or bedside chair)?: A Little Help needed to walk in hospital room?: A Little Help needed climbing 3-5 steps with a railing? : A Little 6 Click Score: 18    End of Session Equipment Utilized During Treatment: Oxygen Activity Tolerance: Patient tolerated treatment well Patient left: in bed;with call bell/phone within reach;with bed alarm set;with family/visitor present Nurse Communication: Mobility status PT Visit Diagnosis: Other abnormalities of gait and mobility (R26.89);Muscle weakness (generalized) (M62.81)    Time: 1610-9604 PT Time Calculation (min) (ACUTE ONLY): 37 min   Charges:   PT Evaluation $PT Eval Moderate Complexity: 1 Mod          Arlyss Gandy, PT, DPT Acute Rehabilitation Office [phone]   Arlyss Gandy 11/09/2022, 11:45 AM

## 2022-11-09 NOTE — Progress Notes (Addendum)
ANTICOAGULATION CONSULT NOTE - Follow Up Consult  Pharmacy Consult for Heparin Indication: DVT  Allergies  Allergen Reactions   Codeine Phosphate Other (See Comments)    Gi upset   Lipitor [Atorvastatin]     Joint pain   Simvastatin     REACTION: muscle \\T \ joint pains   Sulfamethoxazole Hives   Tape Rash    Adhesive. Pt states "it takes skin off"  Not even paper tape    Patient Measurements: Height: 5\' 6"  (167.6 cm) Weight: 94 kg (207 lb 3.7 oz) IBW/kg (Calculated) : 59.3 Heparin Dosing Weight: HEPARIN DW (KG): 80.1   Vital Signs: Temp: 99.1 F (37.3 C) (05/28 0719) Temp Source: Bladder (05/28 0719) BP: 151/81 (05/28 0700) Pulse Rate: 92 (05/28 0700)  Labs: Recent Labs    11/06/22 2018 11/06/22 2356 11/07/22 0113 11/07/22 0202 11/07/22 1407 11/08/22 0400 11/08/22 0420 11/08/22 1511 11/09/22 0003 11/09/22 0217 11/09/22 0610 11/09/22 0932  HGB 10.1*  --    < > 8.2*  --   --  8.5*  --   --  8.5*  --   --   HCT 33.7*  --    < > 28.2*  --   --  28.1*  --   --  27.8*  --   --   PLT 464*  --   --  367  --   --  410*  --   --  415*  --   --   APTT 30  --   --   --   --   --   --   --   --   --   --   --   LABPROT 14.7  --   --  15.6*  --   --   --   --   --   --   --   --   INR 1.1  --   --  1.2  --   --   --   --   --   --   --   --   HEPARINUNFRC  --   --   --   --   --    < >  --  0.27* 0.25*  --   --  0.20*  CREATININE 2.97*  --   --  3.32* 3.40*  --   --   --   --  2.13*  --   --   CKTOTAL  --  149  --   --   --   --   --   --   --   --   --   --   TROPONINIHS  --   --   --   --   --   --   --   --   --  9 8  --    < > = values in this interval not displayed.    Estimated Creatinine Clearance: 30.8 mL/min (A) (by C-G formula based on SCr of 2.13 mg/dL (H)).   Medications:  Scheduled:   ALPRAZolam  1 mg Oral QHS   Chlorhexidine Gluconate Cloth  6 each Topical Daily   doxycycline  100 mg Oral Q12H   ezetimibe  10 mg Oral Daily   insulin aspart  0-9  Units Subcutaneous Q4H   midodrine  5 mg Oral TID WC   oxyCODONE  30 mg Oral Q12H   pantoprazole  40 mg Oral Daily   sodium chloride flush  10-40  mL Intracatheter Q12H   Infusions:   sodium chloride 10 mL/hr at 11/09/22 0700   ceFEPime (MAXIPIME) IV     heparin 1,900 Units/hr (11/09/22 0700)   lactated ringers 75 mL/hr at 11/09/22 0700   norepinephrine (LEVOPHED) Adult infusion 3 mcg/min (11/09/22 0700)    Assessment: 62 yo F with hx of breast cancer continues on heparin for + DVT.  Heparin level remains subtherapeutic despite rate increase.  No IV issues noted.  Heparin level low again at 0.2 and has dropped some on 1900 units/hr.  No issues with the line. No bleeding.    Goal of Therapy:  Heparin level 0.3-0.7 units/ml Monitor platelets by anticoagulation protocol: Yes   Plan:  Heparin bolus 1000 units x 1.  Increase heparin to 2050 units/hr.  Next heparin level in 8 hours. Continue daily heparin level and CBC while on heparin.  Addendum: Discontinue Heparin Changed Apixaban 10 mg po BID x7 days then 5 mg po BID, per discussion with Dr. Merrily Pew.  Monitor CBC.   Link Snuffer, PharmD, BCPS, BCCCP Clinical Pharmacist Please refer to [facility] for [facility] numbers 11/09/2022 10:43 AM

## 2022-11-09 NOTE — Progress Notes (Signed)
Pharmacy Antibiotic Note  [name] is a 62 y.o. female admitted on 11/06/2022 with febrile neutropenia . Pharmacy consulted for cefepime/vancomycin dosing.   AKI- SCr improving, down to 2.13/CrCl ~30 ml/min WBC 6.2 (ANC 1.5) s/p Granix.  Weaning off pressors.  Tick found on patient overnight (day 2 admit)   Plan: Adjust cefepime to 2g IV every 12h Doxycycline 100 mg po every 12h for 7 days to treat tick borne diseases Monitor clinical progress, c/s, renal function F/up Spotted fever group antibodies F/u de-escalation plan/LOT   Height: 5\' 6"  (167.6 cm) Weight: 94 kg (207 lb 3.7 oz) IBW/kg (Calculated) : 59.3  Temp (24hrs), Avg:99.4 F (37.4 C), Min:98.2 F (36.8 C), Max:100.8 F (38.2 C)  Recent Labs  Lab 11/06/22 2018 11/07/22 0052 11/07/22 0202 11/07/22 0540 11/07/22 1407 11/08/22 0420 11/09/22 0217  WBC 1.8*  --  1.5*  --   --  3.0* 6.2  CREATININE 2.97*  --  3.32*  --  3.40*  --  2.13*  LATICACIDVEN 3.1* 2.2* 1.9 2.5*  --  1.6  --     Estimated Creatinine Clearance: 30.8 mL/min (A) (by C-G formula based on SCr of 2.13 mg/dL (H)).    Allergies  Allergen Reactions   Codeine Phosphate Other (See Comments)    Gi upset   Lipitor [Atorvastatin]     Joint pain   Simvastatin     REACTION: muscle \\T \ joint pains   Sulfamethoxazole Hives   Tape Rash    Adhesive. Pt states "it takes skin off"  Not even paper tape    Link Snuffer, PharmD, BCPS, BCCCP Clinical Pharmacist Please refer to [facility] for [facility] Pharmacy numbers 11/09/2022 10:51 AM

## 2022-11-09 NOTE — Progress Notes (Signed)
ANTICOAGULATION CONSULT NOTE - Follow Up Consult  Pharmacy Consult for Heparin Indication: DVT  Allergies  Allergen Reactions   Codeine Phosphate Other (See Comments)    Gi upset   Lipitor [Atorvastatin]     Joint pain   Simvastatin     REACTION: muscle \\T \ joint pains   Sulfamethoxazole Hives   Tape Rash    Adhesive. Pt states "it takes skin off"  Not even paper tape    Patient Measurements: Height: 5\' 6"  (167.6 cm) Weight: 94 kg (207 lb 3.7 oz) IBW/kg (Calculated) : 59.3 Heparin Dosing Weight: HEPARIN DW (KG): 80.1   Vital Signs: Temp: 98.8 F (37.1 C) (05/28 0100) Temp Source: Bladder (05/27 2306) BP: 108/57 (05/28 0100) Pulse Rate: 86 (05/28 0100)  Labs: Recent Labs    11/06/22 2018 11/06/22 2356 11/07/22 0113 11/07/22 0202 11/07/22 1407 11/08/22 0400 11/08/22 0420 11/08/22 1511 11/09/22 0003  HGB 10.1*  --  9.2* 8.2*  --   --  8.5*  --   --   HCT 33.7*  --  27.0* 28.2*  --   --  28.1*  --   --   PLT 464*  --   --  367  --   --  410*  --   --   APTT 30  --   --   --   --   --   --   --   --   LABPROT 14.7  --   --  15.6*  --   --   --   --   --   INR 1.1  --   --  1.2  --   --   --   --   --   HEPARINUNFRC  --   --   --   --   --  0.16*  --  0.27* 0.25*  CREATININE 2.97*  --   --  3.32* 3.40*  --   --   --   --   CKTOTAL  --  149  --   --   --   --   --   --   --     Estimated Creatinine Clearance: 19.3 mL/min (A) (by C-G formula based on SCr of 3.4 mg/dL (H)).   Medications:  Scheduled:   ALPRAZolam  1 mg Oral QHS   Chlorhexidine Gluconate Cloth  6 each Topical Daily   ezetimibe  10 mg Oral Daily   insulin aspart  0-9 Units Subcutaneous Q4H   midodrine  5 mg Oral TID WC   oxyCODONE  30 mg Oral Q12H   pantoprazole  40 mg Oral Daily   sodium chloride flush  10-40 mL Intracatheter Q12H   Tbo-filgastrim (GRANIX) SQ  480 mcg Subcutaneous Q24H   Infusions:   sodium chloride Stopped (11/09/22 0002)   azithromycin 250 mL/hr at 11/09/22 0100    ceFEPime (MAXIPIME) IV 2 g (11/09/22 0106)   heparin 1,750 Units/hr (11/09/22 0100)   lactated ringers 75 mL/hr at 11/09/22 0100   norepinephrine (LEVOPHED) Adult infusion 3 mcg/min (11/09/22 0100)    Assessment: 62 yo F with hx of breast cancer continues on heparin for + DVT.  Heparin level remains subtherapeutic despite rate increase.  No IV issues noted.  Goal of Therapy:  Heparin level 0.3-0.7 units/ml Monitor platelets by anticoagulation protocol: Yes   Plan:  Increase heparin to 1900 units/hr Next heparin level in 8 hours. Continue daily heparin level and CBC while on heparin.  Abhijot Straughter,  Pharm.D., BCPS Clinical Pharmacist 11/09/2022 1:46 AM

## 2022-11-09 NOTE — Progress Notes (Signed)
eLink Physician-Brief Progress Note Patient Name: [name] DOB: 15-Oct-1960 MRN: [id number]   Date of Service  11/09/2022  HPI/Events of Note  Notified of patient complaint of chest pain.   She is a 62/F with breast VA s/p mastectomy and currently on chemotherapy with febrile neutropenia.  She reports having intermittent sharp pain in the chest, which had waned off by the time of my assessment. Pain is different from heartburn as per patient.   EKG sinus rhythm, RBBB with no acute ST elevations.   eICU Interventions  Continue to monitor for now.  Add troponin to next set of labs.      Intervention Category Intermediate Interventions: Pain - evaluation and management  Larinda Buttery 11/09/2022, 12:38 AM

## 2022-11-10 ENCOUNTER — Other Ambulatory Visit: Payer: Self-pay | Admitting: Hematology and Oncology

## 2022-11-10 ENCOUNTER — Ambulatory Visit: Payer: Self-pay

## 2022-11-10 ENCOUNTER — Telehealth: Payer: Self-pay | Admitting: *Deleted

## 2022-11-10 DIAGNOSIS — Z170 Estrogen receptor positive status [ER+]: Secondary | ICD-10-CM

## 2022-11-10 DIAGNOSIS — D709 Neutropenia, unspecified: Secondary | ICD-10-CM | POA: Diagnosis not present

## 2022-11-10 DIAGNOSIS — R4182 Altered mental status, unspecified: Secondary | ICD-10-CM | POA: Diagnosis not present

## 2022-11-10 DIAGNOSIS — R5081 Fever presenting with conditions classified elsewhere: Secondary | ICD-10-CM | POA: Diagnosis not present

## 2022-11-10 DIAGNOSIS — A419 Sepsis, unspecified organism: Secondary | ICD-10-CM | POA: Diagnosis not present

## 2022-11-10 DIAGNOSIS — Z171 Estrogen receptor negative status [ER-]: Secondary | ICD-10-CM

## 2022-11-10 LAB — CBC WITH DIFFERENTIAL/PLATELET
Abs Immature Granulocytes: 0.4 10*3/uL — ABNORMAL HIGH (ref 0.00–0.07)
Basophils Absolute: 0 10*3/uL (ref 0.0–0.1)
Basophils Relative: 0 %
Eosinophils Absolute: 0.6 10*3/uL — ABNORMAL HIGH (ref 0.0–0.5)
Eosinophils Relative: 4 %
HCT: 30.2 % — ABNORMAL LOW (ref 36.0–46.0)
Hemoglobin: 9.3 g/dL — ABNORMAL LOW (ref 12.0–15.0)
Lymphocytes Relative: 4 %
Lymphs Abs: 0.6 10*3/uL — ABNORMAL LOW (ref 0.7–4.0)
MCH: 27.8 pg (ref 26.0–34.0)
MCHC: 30.8 g/dL (ref 30.0–36.0)
MCV: 90.4 fL (ref 80.0–100.0)
Metamyelocytes Relative: 1 %
Monocytes Absolute: 1.3 10*3/uL — ABNORMAL HIGH (ref 0.1–1.0)
Monocytes Relative: 9 %
Myelocytes: 1 %
Neutro Abs: 11.1 10*3/uL — ABNORMAL HIGH (ref 1.7–7.7)
Neutrophils Relative %: 80 %
Platelets: 493 10*3/uL — ABNORMAL HIGH (ref 150–400)
Promyelocytes Relative: 1 %
RBC: 3.34 MIL/uL — ABNORMAL LOW (ref 3.87–5.11)
RDW: 21.4 % — ABNORMAL HIGH (ref 11.5–15.5)
WBC: 13.9 10*3/uL — ABNORMAL HIGH (ref 4.0–10.5)
nRBC: 0.4 % — ABNORMAL HIGH (ref 0.0–0.2)
nRBC: 2 /100 WBC — ABNORMAL HIGH

## 2022-11-10 LAB — GLUCOSE, CAPILLARY
Glucose-Capillary: 132 mg/dL — ABNORMAL HIGH (ref 70–99)
Glucose-Capillary: 126 mg/dL — ABNORMAL HIGH (ref 70–99)
Glucose-Capillary: 113 mg/dL — ABNORMAL HIGH (ref 70–99)
Glucose-Capillary: 122 mg/dL — ABNORMAL HIGH (ref 70–99)

## 2022-11-10 LAB — SPOTTED FEVER GROUP ANTIBODIES
Spotted Fever Group IgG: <1:64 {titer}
Spotted Fever Group IgM: <1:64 {titer}

## 2022-11-10 MED ORDER — HYDRALAZINE HCL 20 MG/ML IJ SOLN
10.0000 mg | INTRAMUSCULAR | Status: DC | PRN
  Administered 2022-11-10 – 2022-11-11 (×3): 10 mg via INTRAVENOUS
  Filled 2022-11-10 (×3): qty 1

## 2022-11-10 MED ORDER — QUETIAPINE FUMARATE 25 MG PO TABS
25.0000 mg | ORAL_TABLET | Freq: Every day | ORAL | Status: DC
  Administered 2022-11-10: 25 mg via ORAL
  Filled 2022-11-10: qty 1

## 2022-11-10 MED ORDER — AMLODIPINE BESYLATE 10 MG PO TABS
10.0000 mg | ORAL_TABLET | Freq: Every day | ORAL | Status: DC
  Administered 2022-11-10 – 2022-11-13 (×4): 10 mg via ORAL
  Filled 2022-11-10 (×4): qty 1

## 2022-11-10 MED ORDER — HEPARIN SOD (PORK) LOCK FLUSH 100 UNIT/ML IV SOLN
500.0000 [IU] | Freq: Once | INTRAVENOUS | Status: AC
  Administered 2022-11-10: 500 [IU] via INTRAVENOUS
  Filled 2022-11-10: qty 5

## 2022-11-10 NOTE — Hospital Course (Signed)
62yo with hx DM2, breast cancer undergoing chemo and s/p mastectomy, asthma, HTN, HLD presented with toxic metaboilc encephalopathy. Pt was found to have PNA with septic shock requiring pressor support. Pressors were weaned and pt improved. Pt's care was transferred to [facility]

## 2022-11-10 NOTE — Progress Notes (Signed)
Because of sepsis and hospitalization or chemo for 11/12/2022 has been canceled we will make a new appointment on 11/19/2022 for labs and MD visit and for chemotherapy.

## 2022-11-10 NOTE — Progress Notes (Signed)
   Durable Medical Equipment (From admission, onward)        Start     Ordered  11/10/22 1009  For home use only DME standard manual wheelchair with seat cushion  Once      Comments: Patient suffers from breast cancer, sepsis, pna, Altered mental status, weakness which impairs their ability to perform daily activities like dressing and grooming in the home.  A walker will not resolve issue with performing activities of daily living. A wheelchair will allow patient to safely perform daily activities. Patient can safely propel the wheelchair in the home or has a caregiver who can provide assistance. Length of need Lifetime. Accessories: elevating leg rests (ELRs), wheel locks, extensions and anti-tippers.  11/10/22 1009

## 2022-11-10 NOTE — Plan of Care (Signed)
  Problem: Clinical Measurements: Goal: Signs and symptoms of infection will decrease Outcome: Progressing   

## 2022-11-10 NOTE — TOC Initial Note (Signed)
Transition of Care [facility]) - Initial/Assessment Note    Patient Details  Name: [name] MRN: [id number] Date of Birth: [birth year]  Transition of Care [facility]) CM/SW Contact:    Harriet Masson, RN Phone Number: 11/10/2022, 10:13 AM  Clinical Narrative:                 Spoke to patient and family at bedside regarding transition needs. Orders for Home Health and DME.  Choice offered an patient defers to [facility] to find [facility] agency.  Eber Jones with [facility] accepted referral. Patient has no preference of DME agency.  Notified Jermaine with [employer] of Wheelchair order.  Address, phone number and PCP confirmed.  Expected Discharge Plan: Home w Home Health Services Barriers to Discharge: Continued Medical Work up   Patient Goals and CMS Choice Patient states their goals for this hospitalization and ongoing recovery are:: return home CMS Medicare.gov Compare Post Acute Care list provided to:: Patient Choice offered to / list presented to : Patient      Expected Discharge Plan and Services     Post Acute Care Choice: Durable Medical Equipment, Home Health Living arrangements for the past 2 months: Single Family Home                 DME Arranged: Wheelchair manual DME Agency: [employer] Date DME Agency Contacted: 11/10/22 Time DME Agency Contacted: 1012 Representative spoke with at DME Agency: Vaughan Basta HH Arranged: PT HH Agency: [facility] Home Care Date [facility] Agency Contacted: 11/10/22 Time HH Agency Contacted: 1013 Representative spoke with at [facility] Agency: Eber Jones  Prior Living Arrangements/Services Living arrangements for the past 2 months: Single Family Home Lives with:: Spouse Patient language and need for interpreter reviewed:: Yes Do you feel safe going back to the place where you live?: Yes        Care giver support system in place?: Yes (comment)   Criminal Activity/Legal Involvement Pertinent to Current Situation/Hospitalization: No - Comment as  needed  Activities of Daily Living Home Assistive Devices/Equipment: None ADL Screening (condition at time of admission) Patient's cognitive ability adequate to safely complete daily activities?: Yes Is the patient deaf or have difficulty hearing?: No Does the patient have difficulty seeing, even when wearing glasses/contacts?: No Does the patient have difficulty concentrating, remembering, or making decisions?: No Patient able to express need for assistance with ADLs?: Yes Does the patient have difficulty dressing or bathing?: No Independently performs ADLs?: Yes (appropriate for developmental age) Does the patient have difficulty walking or climbing stairs?: No Weakness of Legs: None Weakness of Arms/Hands: None  Permission Sought/Granted Permission sought to share information with : Photographer granted to share info w AGENCY: HH        Emotional Assessment Appearance:: Appears stated age Attitude/Demeanor/Rapport: Gracious, Engaged   Orientation: : Oriented to Self, Oriented to  Time, Oriented to Place, Oriented to Situation Alcohol / Substance Use: Not Applicable Psych Involvement: No (comment)  Admission diagnosis:  Febrile neutropenia (HCC) [D70.9, R50.81] Altered mental status, unspecified altered mental status type [R41.82] Pneumonia of right lower lobe due to infectious organism [J18.9] Sepsis, due to unspecified organism, unspecified whether acute organ dysfunction present [facility]) [A41.9] Patient Active Problem List   Diagnosis Date Noted   Febrile neutropenia (HCC) 11/07/2022   Localized swelling of right lower extremity 11/07/2022   Port-A-Cath in place 08/27/2022   Type 2 diabetes mellitus with hyperglycemia (HCC) 08/27/2022   Vitamin D deficiency 08/27/2022  Pure hypercholesterolemia 08/27/2022   Bilateral breast cancer (HCC) 07/19/2022   Atypical pneumonia 06/19/2022   Acute hypoxemic respiratory failure (HCC) 06/19/2022    Severe sepsis (HCC) 06/19/2022   AKI (acute kidney injury) (HCC) 06/19/2022   Acute pyelonephritis 06/19/2022   Acute metabolic encephalopathy 06/19/2022   Genetic testing 06/03/2022   Malignant neoplasm of upper-outer quadrant of right breast in female, estrogen receptor negative (HCC) 05/24/2022   Malignant neoplasm of overlapping sites of left breast in female, estrogen receptor positive (HCC) 05/24/2022   Acute respiratory failure due to COVID-19 [facility]) 07/13/2020   Acute encephalopathy 07/13/2020   Renal insufficiency 07/13/2020   Neuritis 04/23/2015   Neuralgia 04/23/2015   Neuropathy 04/23/2015   Hyperalgesia 04/23/2015   Allodynia 04/23/2015   Obese 08/02/2012   Anxiety 06/07/2011   Hyperlipidemia    NECK PAIN 04/20/2010   Essential hypertension 11/29/2008   TOBACCO USE 09/30/2007   SYNDROME, CHRONIC PAIN 03/24/2007   MENOPAUSAL DISORDER 03/24/2007   GERD 01/04/2007   CHOLELITHIASIS 01/04/2007   PCP:  Fatima Sanger, FNP Pharmacy:   [facility] [phone] - [place], [place] - [address] AT [facility] OF [address] [place] [address] Phone: [phone] Fax: [phone]  [facility] [address] [place] [address] Phone: [phone] Fax: [phone]     Social Determinants of Health (SDOH) Social History: SDOH Screenings   Food Insecurity: No Food Insecurity (06/19/2022)  Housing: Low Risk  (06/19/2022)  Transportation Needs: No Transportation Needs (09/06/2022)  Utilities: Not At Risk (09/06/2022)  Depression (PHQ2-9): Low Risk  (09/06/2022)  Tobacco Use: Medium Risk (08/13/2022)   SDOH Interventions:     Readmission Risk Interventions     No data to display

## 2022-11-10 NOTE — Progress Notes (Signed)
Pt would like the fluid in her breast drawn while she is here. Dr. Billey Chang office said anyone on his team can do it.

## 2022-11-10 NOTE — Progress Notes (Signed)
  Progress Note   Patient: [name] WJX:[id number] DOB: 1961-03-08 DOA: 11/06/2022     3 DOS: the patient was seen and examined on 11/10/2022   Brief hospital course: 62yo with hx DM2, breast cancer undergoing chemo and s/p mastectomy, asthma, HTN, HLD presented with toxic metaboilc encephalopathy. Pt was found to have PNA with septic shock requiring pressor support. Pressors were weaned and pt improved. Pt's care was transferred to [facility]  Assessment and Plan: Septic shock with severe sepsis secondary to PNA present on admit -Pt is continued on cefepime anticipated through 5/30 -Clinically improved -WBC now trending up, see below  Febrile neutropenia -Presented neutropenic -Pt is s/p 3 days of filgrastim  -WBC now up to 13.9k -Neutropenia resolved  ARF -Renal function improving -Recheck bmet in AM  Acute toxic metabolic encephalopathy -Still confused this AM -Family reports pt has not been sleeping at all at night -Cont PRN xanax per home regimen -Will add QHS seroquel. Most recent EKG reviewed, QTc unremarkable  DM2 -Glycemic trends stable -continue on SSI as needed  DVT -continue on eliquis -recheck cbc in AM  Anemia of chronic disease -Hemodynamically stable  Breast cancer -Have tagged Dr. Pamelia Hoit. Per Oncology, chemo for 5/31 has been cancelled given this hospitalization   Subjective: Pleasantly confused this AM  Physical Exam: Vitals:   11/10/22 0547 11/10/22 0727 11/10/22 0910 11/10/22 1621  BP: (!) 168/73 (!) 185/103 (!) 161/82 (!) 157/78  Pulse: 95 (!) 104 99 97  Resp: 18 17  18   Temp: 97.9 F (36.6 C) 97.6 F (36.4 C)  97.9 F (36.6 C)  TempSrc: Oral Oral  Oral  SpO2: 93% 93%  93%  Weight:      Height:       General exam: Awake, laying in bed, in nad Respiratory system: Normal respiratory effort, no wheezing Cardiovascular system: regular rate, s1, s2 Gastrointestinal system: Soft, nondistended, positive BS Central nervous system: CN2-12  grossly intact, strength intact Extremities: Perfused, no clubbing Skin: Normal skin turgor, no notable skin lesions seen Psychiatry: Mood normal // no visual hallucinations   Data Reviewed:  Labs reviewed: WBC 13.9, Hgb 9.3  Family Communication: Pt in room, family at bedside  Disposition: Status is: Inpatient Remains inpatient appropriate because: Severity of illness  Planned Discharge Destination: Home     Author: Rickey Barbara, MD 11/10/2022 5:26 PM  For on call review [email or web address].

## 2022-11-10 NOTE — Telephone Encounter (Signed)
Received call from pt [name] stating pt has been admitted for Sepsis on 11/06/22.  States pt is scheduled for tx 11/12/22 and requesting advice from MD as to how far out her tx should be rescheduled due to hospitalization.  RN will review with MD for further recommendations.

## 2022-11-10 NOTE — Patient Outreach (Signed)
  Care Coordination   Hospital chart review   Visit Note   11/10/2022 Name: [name] MRN: [id number] DOB: 1961-03-08  [name] is a 62 y.o. year old female who sees Larose Hires, Gaylyn Lambert, FNP for primary care. I  reviewed patient chart, she is noted to be hospitalized for sepsis.  RN CM will follow up as appropriate after discharge.    What matters to the patients health and wellness today?  N/A    Goals Addressed             This Visit's Progress    Beating Breast Cancer       Patient Goals/Self Care Activities: -Patient/Caregiver will self-administer medications as prescribed as evidenced by self-report/primary caregiver report  -Patient/Caregiver will attend all scheduled provider appointments as evidenced by clinician review of documented attendance to scheduled appointments and patient/caregiver report -Patient/Caregiver will call provider office for new concerns or questions as evidenced by review of documented incoming telephone call notes and patient report  -check blood sugar at prescribed times -record values and write them down take them to all doctor visits   -Patient in current treatment for breast cancer- Drain tubes removed and surgical and drain tube wounds healed now.  Patient has 3 more chemo treatments.  Discussed care while in treatment.  Chemo every 3rd Friday-no problems with transportation.   Blood sugars okay last reading 152.  11/10/22-Patient noted to be in the hospital for sepsis. RN CM will follow up as appropriate after hospitalization.          SDOH assessments and interventions completed:  No     Care Coordination Interventions:  No, not indicated   Follow up plan:  As appropriate pending hospital discharge    Encounter Outcome:  Pt. Visit Completed   Bary Leriche, RN, MSN [facility] Care Management Care Management Coordinator Direct Line [phone]

## 2022-11-10 NOTE — Progress Notes (Signed)
Since the beginning of my shift, patient continued to state that her oncologist said she should only allow the chest port to be used for chemo. She refused me to use it for blood draws and IV fluids so I contacted the doctor to deaccess it.

## 2022-11-11 ENCOUNTER — Inpatient Hospital Stay (HOSPITAL_COMMUNITY): Payer: BC Managed Care – PPO

## 2022-11-11 DIAGNOSIS — R4182 Altered mental status, unspecified: Secondary | ICD-10-CM | POA: Diagnosis not present

## 2022-11-11 DIAGNOSIS — D709 Neutropenia, unspecified: Secondary | ICD-10-CM | POA: Diagnosis not present

## 2022-11-11 DIAGNOSIS — A419 Sepsis, unspecified organism: Secondary | ICD-10-CM | POA: Diagnosis not present

## 2022-11-11 DIAGNOSIS — R5081 Fever presenting with conditions classified elsewhere: Secondary | ICD-10-CM | POA: Diagnosis not present

## 2022-11-11 LAB — GLUCOSE, CAPILLARY
Glucose-Capillary: 126 mg/dL — ABNORMAL HIGH (ref 70–99)
Glucose-Capillary: 133 mg/dL — ABNORMAL HIGH (ref 70–99)
Glucose-Capillary: 131 mg/dL — ABNORMAL HIGH (ref 70–99)
Glucose-Capillary: 135 mg/dL — ABNORMAL HIGH (ref 70–99)

## 2022-11-11 LAB — CBC WITH DIFFERENTIAL/PLATELET
Abs Immature Granulocytes: 0.8 10*3/uL — ABNORMAL HIGH (ref 0.00–0.07)
Basophils Absolute: 1 10*3/uL — ABNORMAL HIGH (ref 0.0–0.1)
Basophils Relative: 6 %
Blasts: 2 %
Eosinophils Absolute: 0.7 10*3/uL — ABNORMAL HIGH (ref 0.0–0.5)
Eosinophils Relative: 4 %
HCT: 29.9 % — ABNORMAL LOW (ref 36.0–46.0)
Hemoglobin: 9.3 g/dL — ABNORMAL LOW (ref 12.0–15.0)
Lymphocytes Relative: 8 %
Lymphs Abs: 1.3 10*3/uL (ref 0.7–4.0)
MCH: 28.7 pg (ref 26.0–34.0)
MCHC: 31.1 g/dL (ref 30.0–36.0)
MCV: 92.3 fL (ref 80.0–100.0)
Metamyelocytes Relative: 2 %
Monocytes Absolute: 0.3 10*3/uL (ref 0.1–1.0)
Monocytes Relative: 2 %
Neutro Abs: 11.9 10*3/uL — ABNORMAL HIGH (ref 1.7–7.7)
Neutrophils Relative %: 73 %
Platelets: 427 10*3/uL — ABNORMAL HIGH (ref 150–400)
Promyelocytes Relative: 3 %
RBC: 3.24 MIL/uL — ABNORMAL LOW (ref 3.87–5.11)
RDW: 21.2 % — ABNORMAL HIGH (ref 11.5–15.5)
WBC: 16.3 10*3/uL — ABNORMAL HIGH (ref 4.0–10.5)
nRBC: 0 /100 WBC
nRBC: 0.2 % (ref 0.0–0.2)

## 2022-11-11 LAB — COMPREHENSIVE METABOLIC PANEL
ALT: 14 U/L (ref 0–44)
AST: 19 U/L (ref 15–41)
Albumin: 2.9 g/dL — ABNORMAL LOW (ref 3.5–5.0)
Alkaline Phosphatase: 90 U/L (ref 38–126)
Anion gap: 12 (ref 5–15)
BUN: 13 mg/dL (ref 8–23)
CO2: 25 mmol/L (ref 22–32)
Calcium: 8.9 mg/dL (ref 8.9–10.3)
Chloride: 99 mmol/L (ref 98–111)
Creatinine, Ser: 1.35 mg/dL — ABNORMAL HIGH (ref 0.44–1.00)
GFR, Estimated: 44 mL/min — ABNORMAL LOW (ref >60)
Glucose, Bld: 116 mg/dL — ABNORMAL HIGH (ref 70–99)
Potassium: 3.8 mmol/L (ref 3.5–5.1)
Sodium: 136 mmol/L (ref 135–145)
Total Bilirubin: 0.7 mg/dL (ref 0.3–1.2)
Total Protein: 6 g/dL — ABNORMAL LOW (ref 6.5–8.1)

## 2022-11-11 LAB — CULTURE, BLOOD (ROUTINE X 2): Special Requests: ADEQUATE

## 2022-11-11 MED ORDER — ALPRAZOLAM 0.5 MG PO TABS: 1.0000 mg | ORAL_TABLET | Freq: Every evening | ORAL | Status: DC | PRN

## 2022-11-11 MED ORDER — QUETIAPINE FUMARATE 25 MG PO TABS
50.0000 mg | ORAL_TABLET | Freq: Every day | ORAL | Status: DC
  Administered 2022-11-11 – 2022-11-12 (×2): 50 mg via ORAL
  Filled 2022-11-11 (×2): qty 2

## 2022-11-11 MED ORDER — MELATONIN 5 MG PO TABS
5.0000 mg | ORAL_TABLET | Freq: Every day | ORAL | Status: DC
  Administered 2022-11-11: 5 mg via ORAL
  Filled 2022-11-11: qty 1

## 2022-11-11 NOTE — Progress Notes (Signed)
  Progress Note   Patient: NIYOMI PARK ZOX:[id number] DOB: 15-Mar-1961 DOA: 11/06/2022     4 DOS: the patient was seen and examined on 11/11/2022   Brief hospital course: 62yo with hx DM2, breast cancer undergoing chemo and s/p mastectomy, asthma, HTN, HLD presented with toxic metaboilc encephalopathy. Pt was found to have PNA with septic shock requiring pressor support. Pressors were weaned and pt improved. Pt's care was transferred to [facility]  Assessment and Plan: Septic shock with severe sepsis secondary to PNA present on admit -Pt is continued on cefepime anticipated through 5/30 -Clinically improved -WBC had trended up  Febrile neutropenia -Presented neutropenic -Pt is s/p 3 days of filgrastim  -WBC now up to  16.3k -Neutropenia resolved  ARF -Renal function continues to improve -Recheck bmet in AM  Acute toxic metabolic encephalopathy -Remains confused this AM per family -Family reports pt has not been sleeping at all at night -Family reports pt has been taking 1mg  qhs instead of divided PRN doses as originally prescribed. Will make this change to reflect home dosing as pt has been taking this for years now -Little improvement with 25mg  seroquel. Will increase to 50mg  and added melatonin  DM2 -Glycemic trends stable -continue on SSI as needed  DVT -continue on eliquis -recheck cbc in AM  Anemia of chronic disease -Hemodynamically stable  Breast cancer -Have tagged Dr. Pamelia Hoit. Per Oncology, chemo for 5/31 has been cancelled given this hospitalization -Pt complains of increased swelling and concerned about fluid collection. Examined with female RN in room present -overlying area does not appear erythematous. Have ordered US soft tissue of breast areas in question to r/o underlying fluid collection   Subjective: Remains somewhat comfused. Did not sleep well overnight. Complaining of B breast swelling  Physical Exam: Vitals:   11/10/22 2200 11/11/22 0525 11/11/22 0600  11/11/22 0738  BP: (!) 169/97 (!) 164/80 (!) 168/78 (!) 155/79  Pulse: 99 95 99 99  Resp: 18 19    Temp: 97.8 F (36.6 C) 98.1 F (36.7 C)  97.8 F (36.6 C)  TempSrc: Oral Oral  Oral  SpO2: 96% 96%  96%  Weight:      Height:       General exam: Conversant, in no acute distress Respiratory system: normal chest rise, clear, no audible wheezing Cardiovascular system: regular rhythm, s1-s2 Gastrointestinal system: Nondistended, nontender, pos BS Central nervous system: No seizures, no tremors Extremities: No cyanosis, no joint deformities Skin: No rashes, B mastectomy scars. No erythema over B breast tissue or active drainage Psychiatry: Affect normal // no auditory hallucinations   Data Reviewed:  Labs reviewed: Na 136, K 3.8, Cr 1.35, WBC 16.3, Hgb 9.3  Family Communication: Pt in room, family at bedside  Disposition: Status is: Inpatient Remains inpatient appropriate because: Severity of illness  Planned Discharge Destination: Home     Author: Rickey Barbara, MD 11/11/2022 2:47 PM  For on call review [email or web address].

## 2022-11-11 NOTE — Progress Notes (Signed)
Physical Therapy Treatment Patient Details Name: [name] MRN: [id number] DOB: [birth year] Today's Date: 11/11/2022   History of Present Illness 62 y.o. female presents to [facility] hospital on 11/06/2022 with cough, AMS, found to be hypoxic. Pt developed septic shock and febrile neutropenia, requiring pressors. PMH includes DMII, breast CA s/p mastectomy undergoing chemo, OA, asthma, GERD, HLD, HTN.    PT Comments    Pt progressing towards physical therapy goals. Was able to perform transfers with mod I to supervision for safety and ambulation with  min guard assist and no AD. Pt utilizing railings in hallway for support. She was motivated for therapeutic exercise today and performed a few exercises at EOB both seated and standing. Will continue to follow.    Recommendations for follow up therapy are one component of a multi-disciplinary discharge planning process, led by the attending physician.  Recommendations may be updated based on patient status, additional functional criteria and insurance authorization.  Follow Up Recommendations       Assistance Recommended at Discharge PRN  Patient can return home with the following A little help with walking and/or transfers;A little help with bathing/dressing/bathroom;Assistance with cooking/housework;Assist for transportation;Help with stairs or ramp for entrance;Direct supervision/assist for medications management;Direct supervision/assist for financial management   Equipment Recommendations  None recommended by PT    Recommendations for Other Services       Precautions / Restrictions Precautions Precautions: Fall Precaution Comments: neutropenic Restrictions Weight Bearing Restrictions: No     Mobility  Bed Mobility Overal bed mobility: Modified Independent Bed Mobility: Supine to Sit           General bed mobility comments: No assist required.    Transfers Overall transfer level: Needs assistance Equipment used:  None Transfers: Sit to/from Stand Sit to Stand: Supervision           General transfer comment: Pt able to power up to full stand without assistance. No AD required.    Ambulation/Gait Ambulation/Gait assistance: Min guard Gait Distance (Feet): 100 Feet Assistive device: None (Frequent railing use in hall) Gait Pattern/deviations: Step-through pattern Gait velocity: Decreased Gait velocity interpretation: <1.8 ft/sec, indicate of risk for recurrent falls   General Gait Details: Slowed step-through gait with drop foot noted on the L. Pt reports this is baseline (years) and she does not have an AFO for this. Guarded but overall steady with railing support.   Stairs             Wheelchair Mobility    Modified Rankin (Stroke Patients Only)       Balance Overall balance assessment: Needs assistance Sitting-balance support: No upper extremity supported, Feet supported Sitting balance-Leahy Scale: Good     Standing balance support: Single extremity supported, Reliant on assistive device for balance Standing balance-Leahy Scale: Poor                              Cognition Arousal/Alertness: Awake/alert Behavior During Therapy: WFL for tasks assessed/performed Overall Cognitive Status: Within Functional Limits for tasks assessed                                          Exercises General Exercises - Lower Extremity Long Arc Quad: 10 reps, Both, Seated, AROM Hip ABduction/ADduction: 10 reps, Both, Seated, Standing (x10 isometric adduction seated, x10 standing abduction)    General Comments  General comments (skin integrity, edema, etc.): Pt on RA throughout session. SpO2 down to 89% during ambulation but able to recover with standing/seated rest break up to 91%.      Pertinent Vitals/Pain Pain Assessment Pain Assessment: No/denies pain    Home Living                          Prior Function            PT Goals  (current goals can now be found in the care plan section) Acute Rehab PT Goals Patient Stated Goal: to go home PT Goal Formulation: With patient Time For Goal Achievement: 11/23/22 Potential to Achieve Goals: Good Progress towards PT goals: Progressing toward goals    Frequency    Min 3X/week      PT Plan Current plan remains appropriate    Co-evaluation              AM-PAC PT "6 Clicks" Mobility   Outcome Measure  Help needed turning from your back to your side while in a flat bed without using bedrails?: None Help needed moving from lying on your back to sitting on the side of a flat bed without using bedrails?: None Help needed moving to and from a bed to a chair (including a wheelchair)?: A Little Help needed standing up from a chair using your arms (e.g., wheelchair or bedside chair)?: A Little Help needed to walk in hospital room?: A Little Help needed climbing 3-5 steps with a railing? : A Little 6 Click Score: 20    End of Session Equipment Utilized During Treatment: Oxygen Activity Tolerance: Patient tolerated treatment well Patient left: in bed;with call bell/phone within reach;with bed alarm set;with family/visitor present Nurse Communication: Mobility status PT Visit Diagnosis: Other abnormalities of gait and mobility (R26.89);Muscle weakness (generalized) (M62.81)     Time: 1610-9604 PT Time Calculation (min) (ACUTE ONLY): 19 min  Charges:  $Gait Training: 8-22 mins                     [name], PT, DPT Acute Rehabilitation Services Secure Chat Preferred Office: [phone]    [name] 11/11/2022, 1:05 PM

## 2022-11-11 NOTE — Progress Notes (Signed)
Patient complaining of chest pain. Provider Rhona Leavens) made aware, EKG ordered.

## 2022-11-12 ENCOUNTER — Inpatient Hospital Stay (HOSPITAL_COMMUNITY): Payer: BC Managed Care – PPO

## 2022-11-12 ENCOUNTER — Inpatient Hospital Stay: Payer: BC Managed Care – PPO | Admitting: Hematology and Oncology

## 2022-11-12 ENCOUNTER — Inpatient Hospital Stay: Payer: BC Managed Care – PPO

## 2022-11-12 ENCOUNTER — Encounter (HOSPITAL_COMMUNITY): Payer: Self-pay | Admitting: Internal Medicine

## 2022-11-12 ENCOUNTER — Telehealth: Payer: Self-pay | Admitting: Hematology and Oncology

## 2022-11-12 DIAGNOSIS — R5081 Fever presenting with conditions classified elsewhere: Secondary | ICD-10-CM | POA: Diagnosis not present

## 2022-11-12 DIAGNOSIS — D709 Neutropenia, unspecified: Secondary | ICD-10-CM | POA: Diagnosis not present

## 2022-11-12 DIAGNOSIS — R4182 Altered mental status, unspecified: Secondary | ICD-10-CM | POA: Diagnosis not present

## 2022-11-12 DIAGNOSIS — A419 Sepsis, unspecified organism: Secondary | ICD-10-CM | POA: Diagnosis not present

## 2022-11-12 HISTORY — PX: IR US GUIDE BX ASP/DRAIN: IMG2392

## 2022-11-12 LAB — COMPREHENSIVE METABOLIC PANEL
ALT: 14 U/L (ref 0–44)
AST: 18 U/L (ref 15–41)
Albumin: 3 g/dL — ABNORMAL LOW (ref 3.5–5.0)
Alkaline Phosphatase: 96 U/L (ref 38–126)
Anion gap: 11 (ref 5–15)
BUN: 13 mg/dL (ref 8–23)
CO2: 24 mmol/L (ref 22–32)
Calcium: 8.9 mg/dL (ref 8.9–10.3)
Chloride: 100 mmol/L (ref 98–111)
Creatinine, Ser: 1.19 mg/dL — ABNORMAL HIGH (ref 0.44–1.00)
GFR, Estimated: 51 mL/min — ABNORMAL LOW (ref >60)
Glucose, Bld: 122 mg/dL — ABNORMAL HIGH (ref 70–99)
Potassium: 3.3 mmol/L — ABNORMAL LOW (ref 3.5–5.1)
Sodium: 135 mmol/L (ref 135–145)
Total Bilirubin: 0.6 mg/dL (ref 0.3–1.2)
Total Protein: 6.1 g/dL — ABNORMAL LOW (ref 6.5–8.1)

## 2022-11-12 LAB — CBC WITH DIFFERENTIAL/PLATELET
Abs Immature Granulocytes: 0 10*3/uL (ref 0.00–0.07)
Basophils Absolute: 0.4 10*3/uL — ABNORMAL HIGH (ref 0.0–0.1)
Basophils Relative: 2 %
Eosinophils Absolute: 0.2 10*3/uL (ref 0.0–0.5)
Eosinophils Relative: 1 %
HCT: 31.1 % — ABNORMAL LOW (ref 36.0–46.0)
Hemoglobin: 9.5 g/dL — ABNORMAL LOW (ref 12.0–15.0)
Lymphocytes Relative: 6 %
Lymphs Abs: 1.2 10*3/uL (ref 0.7–4.0)
MCH: 27.6 pg (ref 26.0–34.0)
MCHC: 30.5 g/dL (ref 30.0–36.0)
MCV: 90.4 fL (ref 80.0–100.0)
Monocytes Absolute: 1 10*3/uL (ref 0.1–1.0)
Monocytes Relative: 5 %
Neutro Abs: 16.7 10*3/uL — ABNORMAL HIGH (ref 1.7–7.7)
Neutrophils Relative %: 86 %
Platelets: 406 10*3/uL — ABNORMAL HIGH (ref 150–400)
RBC: 3.44 MIL/uL — ABNORMAL LOW (ref 3.87–5.11)
RDW: 21 % — ABNORMAL HIGH (ref 11.5–15.5)
WBC: 19.4 10*3/uL — ABNORMAL HIGH (ref 4.0–10.5)
nRBC: 0 /100 WBC
nRBC: 0.3 % — ABNORMAL HIGH (ref 0.0–0.2)

## 2022-11-12 LAB — GRAM STAIN: Gram Stain: NONE SEEN

## 2022-11-12 LAB — GLUCOSE, CAPILLARY
Glucose-Capillary: 111 mg/dL — ABNORMAL HIGH (ref 70–99)
Glucose-Capillary: 117 mg/dL — ABNORMAL HIGH (ref 70–99)
Glucose-Capillary: 122 mg/dL — ABNORMAL HIGH (ref 70–99)
Glucose-Capillary: 115 mg/dL — ABNORMAL HIGH (ref 70–99)

## 2022-11-12 MED ORDER — MIDAZOLAM HCL 2 MG/2ML IJ SOLN
INTRAMUSCULAR | Status: AC | PRN
  Administered 2022-11-12: .5 mg via INTRAVENOUS

## 2022-11-12 MED ORDER — MELATONIN 5 MG PO TABS
5.0000 mg | ORAL_TABLET | Freq: Every evening | ORAL | Status: DC | PRN
  Administered 2022-11-12: 5 mg via ORAL
  Filled 2022-11-12: qty 1

## 2022-11-12 MED ORDER — MIDAZOLAM HCL 2 MG/2ML IJ SOLN
INTRAMUSCULAR | Status: AC
  Filled 2022-11-12: qty 2

## 2022-11-12 MED ORDER — FENTANYL CITRATE (PF) 100 MCG/2ML IJ SOLN
INTRAMUSCULAR | Status: AC | PRN
  Administered 2022-11-12: 25 ug via INTRAVENOUS

## 2022-11-12 MED ORDER — FENTANYL CITRATE (PF) 100 MCG/2ML IJ SOLN
INTRAMUSCULAR | Status: AC
  Filled 2022-11-12: qty 2

## 2022-11-12 MED ORDER — POTASSIUM CHLORIDE CRYS ER 20 MEQ PO TBCR
60.0000 meq | EXTENDED_RELEASE_TABLET | Freq: Once | ORAL | Status: AC
  Administered 2022-11-12: 60 meq via ORAL
  Filled 2022-11-12: qty 3

## 2022-11-12 MED ORDER — ALPRAZOLAM 0.5 MG PO TABS
0.5000 mg | ORAL_TABLET | Freq: Once | ORAL | Status: AC | PRN
  Administered 2022-11-12: 0.5 mg via ORAL
  Filled 2022-11-12: qty 1

## 2022-11-12 MED ORDER — ALPRAZOLAM 0.5 MG PO TABS
1.0000 mg | ORAL_TABLET | Freq: Every day | ORAL | Status: DC
  Administered 2022-11-12: 1 mg via ORAL
  Filled 2022-11-12: qty 2

## 2022-11-12 MED ORDER — LIDOCAINE HCL 1 % IJ SOLN
INTRAMUSCULAR | Status: AC
  Filled 2022-11-12: qty 20

## 2022-11-12 NOTE — Progress Notes (Signed)
Patient refused afternoon vitals.

## 2022-11-12 NOTE — Procedures (Signed)
Interventional Radiology Procedure Note  Procedure: [place] RT BREAST SEROMA DRIAN    Complications: None  Estimated Blood Loss:  MIN  Findings: 10 FR DRAIN TO BULB 120CC CLEAR SEROSANG FLD ASPIRATED    Sharen Counter, MD

## 2022-11-12 NOTE — Progress Notes (Signed)
  Progress Note   Patient: [name] ZOX:[id number] DOB: 1960-07-03 DOA: 11/06/2022     5 DOS: the patient was seen and examined on 11/12/2022   Brief hospital course: 62yo with hx DM2, breast cancer undergoing chemo and s/p mastectomy, asthma, HTN, HLD presented with toxic metaboilc encephalopathy. Pt was found to have PNA with septic shock requiring pressor support. Pressors were weaned and pt improved. Pt's care was [facility]  Assessment and Plan: Septic shock with severe sepsis secondary to PNA present on admit -Pt is continued on cefepime anticipated through 5/30 -Clinically improved -WBC had trended up per below  Febrile neutropenia -Presented neutropenic -Pt is s/p 3 days of filgrastim  -WBC now up to  19.4k. Afebrile -Neutropenia resolved  ARF -Renal function continues to improve -Recheck bmet in AM  Acute toxic metabolic encephalopathy -Remains confused this AM per family -Family reports pt has not been sleeping at all at night -Family reports pt has been taking 1mg  qhs instead of divided PRN doses as originally prescribed. Now on 1mg  qhs to reflect home dosing as pt has been taking this for years now -On seroquel 50mg  and with added melatonin  DM2 -Glycemic trends stable -continue on SSI as needed  DVT -continue on eliquis -recheck cbc in AM  Anemia of chronic disease -Hemodynamically stable  Breast cancer -Have tagged Dr. Pamelia Hoit. Per Oncology, chemo for 5/31 has been cancelled given this hospitalization  Breast seroma -Pt complained of increased swelling and concerned about fluid collection. Examined with female RN in room present -overlying area does not appear erythematous.  -Chest [place] confirms 9.9cm R sided fluid collection -IR was consulted and pt is now s/p drain placement  Subjective: Still did not sleep well overnight, however is feeling better today  Physical Exam: Vitals:   11/12/22 1404 11/12/22 1415 11/12/22 1420 11/12/22 1425   BP:  132/87 127/74 133/73  Pulse:  97 98 (!) 101  Resp:  19 (!) 22 20  Temp:      TempSrc:      SpO2: 98% 99% 98% 99%  Weight:      Height:       General exam: Awake, laying in bed, in nad Respiratory system: Normal respiratory effort, no wheezing Cardiovascular system: regular rate, s1, s2 Gastrointestinal system: Soft, nondistended, positive BS Central nervous system: CN2-12 grossly intact, strength intact Extremities: Perfused, no clubbing Skin: Normal skin turgor, no notable skin lesions seen Psychiatry: Mood normal // no visual hallucinations   Data Reviewed:  Labs reviewed: Na 135, K 3.3, Cr 1.19, WBC 19.4  Family Communication: Pt in room, family at bedside  Disposition: Status is: Inpatient Remains inpatient appropriate because: Severity of illness  Planned Discharge Destination: Home     Author: Rickey Barbara, MD 11/12/2022 6:26 PM  For on call review [email or web address].

## 2022-11-12 NOTE — Consult Note (Addendum)
Chief Complaint: Right chest wall seroma  Referring Provider(s): Chevis Pretty  Supervising Physician: Ruel Favors  Patient Status: [facility] - In-pt  History of Present Illness: [name] is a 62 y.o. female with breast cancer who underwent mastectomy by Dr. Carolynne Edouard on 07/19/22.  She c/o swelling of the right lateral chest wall.  US done yesterday showed a large multi-septated fluid collection within the right lateral chest measuring up to 9.8 cm, corresponding to the area of swelling.   We are asked to perform an aspiration and possible drain placement.  She last ate at 7:30 am.  Past Medical History:  Diagnosis Date   Anemia    Arthritis    Asthma    Bladder spasms    Breast cancer (HCC)    Diabetes mellitus without complication (HCC)    GERD (gastroesophageal reflux disease)    Heart murmur    Hyperlipidemia    Hypertension     Past Surgical History:  Procedure Laterality Date   BACK SURGERY  1989, 1990, 2006   BREAST BIOPSY Left 05/14/2022   [place] LT BREAST BX W LOC DEV 1ST LESION IMG BX SPEC US GUIDE 05/14/2022 [facility]   BREAST BIOPSY Right 05/14/2022   MM RT BREAST BX W LOC DEV 1ST LESION IMAGE BX SPEC STEREO GUIDE 05/14/2022 [facility]   BREAST BIOPSY Left 05/14/2022   MM LT BREAST BX W LOC DEV 1ST LESION IMAGE BX SPEC STEREO GUIDE 05/14/2022 [facility]   BREAST BIOPSY Right 05/26/2022   MM RT BREAST BX W LOC DEV 1ST LESION IMAGE BX SPEC STEREO GUIDE 05/26/2022 [facility]   BREAST BIOPSY Right 05/26/2022   MM RT BREAST BX W LOC DEV EA AD LESION IMG BX SPEC STEREO GUIDE 05/26/2022 [facility]   BREAST LUMPECTOMY  06/14/1974   bi-lat , both benign    CHOLECYSTECTOMY  06/14/1996   LEG SURGERY  06/15/2007   work related injury   PARTIAL HYSTERECTOMY     still has ovaries   PORTACATH PLACEMENT Right 08/12/2022   Procedure: INSERTION PORT-A-CATH;  Surgeon: Griselda Miner, MD;  Location: [facility];  Service: General;   Laterality: Right;   SENTINEL NODE BIOPSY Bilateral 07/19/2022   Procedure: BILATERAL SENTINEL NODE BIOPSY;  Surgeon: Griselda Miner, MD;  Location: [facility];  Service: General;  Laterality: Bilateral;   TOTAL MASTECTOMY Bilateral 07/19/2022   Procedure: BILATERAL TOTAL MASTECTOMY;  Surgeon: Griselda Miner, MD;  Location: [facility];  Service: General;  Laterality: Bilateral;    Allergies: Codeine phosphate, Lipitor [atorvastatin], Simvastatin, Sulfamethoxazole, and Tape  Medications: Prior to Admission medications   Medication Sig Start Date End Date Taking? Authorizing Provider  albuterol (VENTOLIN HFA) 108 (90 Base) MCG/ACT inhaler Inhale 2 puffs into the lungs every 6 (six) hours as needed for wheezing or shortness of breath. 07/16/20  Yes Ghimire, Werner Lean, MD  ALPRAZolam Prudy Feeler) 0.5 MG tablet Take 0.5 mg by mouth 2 (two) times daily as needed for sleep or anxiety. 02/20/15  Yes [provider]  amitriptyline (ELAVIL) 50 MG tablet Take 50 mg by mouth at bedtime.   Yes [provider]  amLODipine (NORVASC) 10 MG tablet Take 10 mg by mouth daily. 05/05/20  Yes [provider]  ezetimibe (ZETIA) 10 MG tablet Take 10 mg by mouth daily.   Yes [provider]  lidocaine-prilocaine (EMLA) cream Apply to affected area once 07/29/22  Yes Serena Croissant, MD  loratadine (CLARITIN) 10 MG tablet Take  10 mg by mouth daily.   Yes [provider]  metFORMIN (GLUCOPHAGE-XR) 500 MG 24 hr tablet Take 1,000 mg by mouth 2 (two) times daily. 06/09/20  Yes [provider]  MOUNJARO 5 MG/0.5ML Pen Inject 5 mg into the skin every Monday. 11/01/22  Yes [provider]  olmesartan (BENICAR) 40 MG tablet Take 40 mg by mouth daily. 10/01/22  Yes [provider]  oxyCODONE ER (XTAMPZA ER) 36 MG C12A Take 36 mg by mouth in the morning and at bedtime.   Yes [provider]  pantoprazole (PROTONIX) 40 MG tablet Take 40 mg by mouth daily.   Yes [provider]  Pediatric Multivitamins-Iron (FLINTSTONES PLUS EXTRA IRON PO) Take 1 tablet by mouth daily.   Yes [provider]  pregabalin (LYRICA) 100 MG capsule Take 100 mg by mouth 2 (two) times daily.   Yes [provider]  magic mouthwash w/lidocaine SOLN Take 5 mLs by mouth 4 (four) times daily as needed for mouth pain. 09/16/22   Serena Croissant, MD  ondansetron (ZOFRAN) 8 MG tablet TAKE 1 TABLET EVERY 8HRS AS NEEDED FOR NAUSEA OR VOMITING. START ON THE 3RD DAY AFTER CHEMO Patient taking differently: Take 8 mg by mouth every 8 (eight) hours as needed for nausea or vomiting. 10/22/22   Serena Croissant, MD  promethazine (PHENERGAN) 25 MG tablet TAKE 1 TABLET BY MOUTH EVERY 6 HOURS AS NEEDED FOR NAUSEA OR VOMITING. 10/18/22   Serena Croissant, MD     Family History  Problem Relation Age of Onset   Diabetes Mother    Hypertension Mother    Heart disease Mother    Hypertension Father    Diabetes Father    Heart disease Father    Breast cancer Sister 63   Lymphoma Paternal Grandfather        dx after 29   Neuropathy Neg Hx     Social History   Socioeconomic History   Marital status: Married    Spouse name: Ricky    Number of children: 2   Years of education: 9   Highest education level: Not on file  Occupational History   Occupation: Environmental manager  Tobacco Use   Smoking status: Former    Packs/day: 0.50    Years: 37.00    Additional pack years: 0.00    Total pack years: 18.50    Types: Cigarettes    Quit date: 06/14/2013    Years since quitting: 9.4   Smokeless tobacco: Never  Vaping Use   Vaping Use: Never used  Substance and Sexual Activity   Alcohol use: No   Drug use: No   Sexual activity: Never    Comment: 62 YEARS OLD, NO MORE THAN 5 PARTNERS  Other Topics Concern   Not on file  Social History Narrative   Lives at home with husband   Caffeine use: none   Social Determinants of Health   Financial Resource Strain: Not on file  Food Insecurity:  No Food Insecurity (06/19/2022)   Hunger Vital Sign    Worried About Running Out of Food in the Last Year: Never true    Ran Out of Food in the Last Year: Never true  Transportation Needs: No Transportation Needs (09/06/2022)   PRAPARE - Administrator, Civil Service (Medical): No    Lack of Transportation (Non-Medical): No  Physical Activity: Not on file  Stress: Not on file  Social Connections: Not on file  Review of Systems: A 12 point ROS discussed and pertinent positives are indicated in the HPI above.  All other systems are negative.  Review of Systems  Vital Signs: BP (!) 165/82 (BP Location: Left Arm)   Pulse (!) 101   Temp 98.1 F (36.7 C) (Oral)   Resp 18   Ht 5\' 6"  (1.676 m)   Wt 207 lb 3.7 oz (94 kg)   LMP 04/22/1997   SpO2 95%   BMI 33.45 kg/m   Advance Care Plan: The advanced care place/surrogate decision maker was discussed at the time of visit and the patient did not wish to discuss or was not able to name a surrogate decision maker or provide an advance care plan.  Physical Exam Vitals reviewed.  Constitutional:      Appearance: Normal appearance.  Cardiovascular:     Rate and Rhythm: Normal rate and regular rhythm.  Pulmonary:     Effort: Pulmonary effort is normal. No respiratory distress.     Breath sounds: Normal breath sounds.  Musculoskeletal:        General: Normal range of motion.     Comments: Right lateral chest wall swelling and fluctuance c/w seroma.  Neurological:     General: No focal deficit present.     Mental Status: She is alert and oriented to person, place, and time.  Psychiatric:        Mood and Affect: Mood normal.        Behavior: Behavior normal.        Thought Content: Thought content normal.        Judgment: Judgment normal.     Imaging: [place] CHEST SOFT TISSUE  Result Date: 11/11/2022 CLINICAL DATA:  Bilateral total mastectomy 07/19/2022. Breast swelling. EXAM: ULTRASOUND OF CHEST SOFT TISSUES TECHNIQUE:  Ultrasound examination of the chest wall soft tissues was performed in the area of clinical concern. COMPARISON:  None Available. FINDINGS: In the left lateral chest wall, there is a small fluid collection measuring up to 1 cm. This is at the incision site. This may reflect a small postoperative seroma. In the right lateral chest wall, there is a complex cystic area measuring 9.9 x 9.0 x 3.5 cm. Internal echoes and septations. This could reflect liquified hematoma or abscess. IMPRESSION: Right lateral chest wall fluid collection measuring up to 9.9 cm with internal echoes and septations, likely liquified hematoma or abscess. Left lateral chest wall fluid collection measuring up to 1 cm, likely postoperative seroma. Electronically Signed   By: Charlett Nose M.D.   On: 11/11/2022 21:15   [place] CHEST SOFT TISSUE  Result Date: 11/11/2022 CLINICAL DATA:  Chest swelling EXAM: ULTRASOUND OF CHEST SOFT TISSUES TECHNIQUE: Ultrasound examination of the chest wall soft tissues was performed in the area of clinical concern. COMPARISON:  None Available. FINDINGS: Targeted sonography of right lateral chest is performed in the region of swelling. Complex fluid collection, mostly anechoic but contains thin internal septations, this measures 9.8 x 3.5 by 9 cm. Mild overlying skin thickening. IMPRESSION: Status post mastectomy. Large multi-septated fluid collection within the right lateral chest measuring up to 9.8 cm, corresponding to the area of swelling. This is nonspecific in appearance and could represent postoperative collection, sterility is indeterminate by ultrasound. Electronically Signed   By: Jasmine Pang M.D.   On: 11/11/2022 20:00   VAS [place] LOWER EXTREMITY VENOUS (DVT)  Result Date: 11/08/2022  Lower Venous DVT Study Patient Name:  [name]  Date of Exam:   11/07/2022  Medical Rec #: [id number]        Accession #:    [id number] Date of Birth: [birth year]        Patient Gender: F Patient Age:   19 years Exam  Location:  [facility] Procedure:      VAS [place] LOWER EXTREMITY VENOUS (DVT) Referring Phys: Lyda Perone --------------------------------------------------------------------------------  Indications: Swelling, Pain, and Elevated D-Dimer.  Risk Factors: Breast Cancer, status post bilateral mastectomy. On Chemo. Limitations: Edema. Comparison Study: No prior study on file Performing Technologist: Sherren Kerns RVS  Examination Guidelines: A complete evaluation includes B-mode imaging, spectral Doppler, color Doppler, and power Doppler as needed of all accessible portions of each vessel. Bilateral testing is considered an integral part of a complete examination. Limited examinations for reoccurring indications may be performed as noted. The reflux portion of the exam is performed with the patient in reverse Trendelenburg.  +---------+---------------+---------+-----------+---------------+--------------+ RIGHT    CompressibilityPhasicitySpontaneityProperties     Thrombus Aging +---------+---------------+---------+-----------+---------------+--------------+ CFV      Full                               pulsatile                                                                 waveforms                     +---------+---------------+---------+-----------+---------------+--------------+ SFJ      Full                                                             +---------+---------------+---------+-----------+---------------+--------------+ FV Prox  Full                                                             +---------+---------------+---------+-----------+---------------+--------------+ FV Mid   Full                               pulsatile                                                                 waveforms                     +---------+---------------+---------+-----------+---------------+--------------+ FV DistalFull                                                              +---------+---------------+---------+-----------+---------------+--------------+  PFV      Full                                                             +---------+---------------+---------+-----------+---------------+--------------+ POP      Full                               pulsatile                                                                 waveforms                     +---------+---------------+---------+-----------+---------------+--------------+ PTV                                                        Not well                                                                  visualized     +---------+---------------+---------+-----------+---------------+--------------+ PERO     None                                              Acute          +---------+---------------+---------+-----------+---------------+--------------+   +---------+---------------+---------+-----------+---------------+--------------+ LEFT     CompressibilityPhasicitySpontaneityProperties     Thrombus Aging +---------+---------------+---------+-----------+---------------+--------------+ CFV      Full                               pulsatile                                                                 waveforms                     +---------+---------------+---------+-----------+---------------+--------------+ SFJ      Full                                                             +---------+---------------+---------+-----------+---------------+--------------+ FV Prox  Full                                                             +---------+---------------+---------+-----------+---------------+--------------+  FV Mid   Full                               pulsatile                                                                 waveforms                      +---------+---------------+---------+-----------+---------------+--------------+ FV DistalFull                                                             +---------+---------------+---------+-----------+---------------+--------------+ PFV      Full                                                             +---------+---------------+---------+-----------+---------------+--------------+ POP                                         pulsatile      patent by                                                  waveforms      color and                                                                 Doppler        +---------+---------------+---------+-----------+---------------+--------------+ PTV                                                        Not well                                                                  visualized     +---------+---------------+---------+-----------+---------------+--------------+ PERO     None  Acute          +---------+---------------+---------+-----------+---------------+--------------+     Summary: RIGHT: - Findings consistent with acute deep vein thrombosis involving the right peroneal veins. - Technically difficult and limited exam in the calf, however, the peroneal veins appear dilated, do not fully compress, and are absent of color  LEFT: - Findings consistent with acute deep vein thrombosis involving the left peroneal veins. - Technically difficult and limited exam in the calf, however, the peroneal veins appear dilated, do not fully compress, and are absent of color  *See table(s) above for measurements and observations. Electronically signed by Waverly Ferrari MD on 11/08/2022 at 9:29:33 AM.    Final    US RENAL  Result Date: 11/07/2022 CLINICAL DATA:  Acute kidney injury EXAM: RENAL / URINARY TRACT ULTRASOUND COMPLETE COMPARISON:  Abdominal CT 06/19/2022 FINDINGS: Right  Kidney: Not sonographically visualized, severely atrophic and likely nonfunctional by prior CT. Left Kidney: volume: 180 mL. Echogenicity within normal limits. No mass or hydronephrosis visualized. Bladder: Collapsed at time of imaging. Other: Echogenic liver from steatosis, confirmed on prior CT IMPRESSION: 1. Severe right renal atrophy with normal appearing left kidney. 2. Hepatic steatosis. Electronically Signed   By: Tiburcio Pea M.D.   On: 11/07/2022 09:17   DG Chest Portable 1 View  Result Date: 11/06/2022 CLINICAL DATA:  Weakness and confusion. EXAM: PORTABLE CHEST 1 VIEW COMPARISON:  August 12, 2022 FINDINGS: Stable right-sided venous Port-A-Cath positioning is seen. The heart size and mediastinal contours are within normal limits. Mild atelectasis is seen within the bilateral lung bases. There is no evidence of focal consolidation, pleural effusion or pneumothorax. Radiopaque surgical clips are seen overlying the lateral chest wall soft tissues, bilaterally. Multilevel degenerative changes are noted throughout the thoracic spine. IMPRESSION: Mild bibasilar atelectasis. Electronically Signed   By: Aram Candela M.D.   On: 11/06/2022 23:08    Labs:  CBC: Recent Labs    11/09/22 0217 11/10/22 0330 11/11/22 0306 11/12/22 0218  WBC 6.2 13.9* 16.3* 19.4*  HGB 8.5* 9.3* 9.3* 9.5*  HCT 27.8* 30.2* 29.9* 31.1*  PLT 415* 493* 427* 406*    COAGS: Recent Labs    06/18/22 2139 11/06/22 2018 11/07/22 0202  INR 1.1 1.1 1.2  APTT 27 30  --     BMP: Recent Labs    11/07/22 1407 11/09/22 0217 11/11/22 0306 11/12/22 0218  NA 132* 135 136 135  K 5.4* 5.1 3.8 3.3*  CL 100 102 99 100  CO2 24 25 25 24   GLUCOSE 112* 121* 116* 122*  BUN 34* 22 13 13   CALCIUM 7.7* 7.9* 8.9 8.9  CREATININE 3.40* 2.13* 1.35* 1.19*  GFRNONAA 14* 25* 44* 51*    LIVER FUNCTION TESTS: Recent Labs    11/06/22 2018 11/07/22 0202 11/11/22 0306 11/12/22 0218  BILITOT 0.8 0.6 0.7 0.6  AST 18 14*  19 18  ALT 15 15 14 14   ALKPHOS 60 49 90 96  PROT 6.8 5.8* 6.0* 6.1*  ALBUMIN 3.5 2.9* 2.9* 3.0*    TUMOR MARKERS: No results for input(s): "AFPTM", "CEA", "CA199", "CHROMGRNA" in the last 8760 hours.  Assessment and Plan:  Large multi-septated fluid collection within the right lateral chest measuring up to 9.8 cm.  Will proceed with image guided aspiration, possible drain placement today by Dr. Miles Costain.  Risks and benefits discussed with the patient including bleeding, infection, damage to adjacent structures, and sepsis.  All of the patient's questions were answered, patient is agreeable to proceed. Consent signed  and in IR.   Thank you for allowing our service to participate in FLONNIE FELT 's care.  Electronically Signed: Gwynneth Macleod, PA-C   11/12/2022, 9:50 AM      I spent a total of 40 Minutes  in face to face in clinical consultation, greater than 50% of which was counseling/coordinating care for chest wall drain placement.

## 2022-11-12 NOTE — Progress Notes (Signed)
Subjective/Chief Complaint: No complaints   Objective: Vital signs in last 24 hours: Temp:  [98 F (36.7 C)-98.1 F (36.7 C)] 98.1 F (36.7 C) (05/31 0726) Pulse Rate:  [98-103] 101 (05/31 0726) Resp:  [18] 18 (05/31 0726) BP: (154-190)/(78-96) 165/82 (05/31 0726) SpO2:  [95 %-98 %] 95 % (05/31 0726) Last BM Date : 11/10/22  Intake/Output from previous day: 05/30 0701 - 05/31 0700 In: 960 [P.O.:960] Out: -  Intake/Output this shift: No intake/output data recorded.  General appearance: alert and cooperative Chest wall: moderate seroma right chest wall Cardio: regular rate and rhythm GI: soft, non-tender; bowel sounds normal; no masses,  no organomegaly  Lab Results:  Recent Labs    11/11/22 0306 11/12/22 0218  WBC 16.3* 19.4*  HGB 9.3* 9.5*  HCT 29.9* 31.1*  PLT 427* 406*   BMET Recent Labs    11/11/22 0306 11/12/22 0218  NA 136 135  K 3.8 3.3*  CL 99 100  CO2 25 24  GLUCOSE 116* 122*  BUN 13 13  CREATININE 1.35* 1.19*  CALCIUM 8.9 8.9   PT/INR No results for input(s): "LABPROT", "INR" in the last 72 hours. ABG No results for input(s): "PHART", "HCO3" in the last 72 hours.  Invalid input(s): "PCO2", "PO2"  Studies/Results: [place] CHEST SOFT TISSUE  Result Date: 11/11/2022 CLINICAL DATA:  Bilateral total mastectomy 07/19/2022. Breast swelling. EXAM: ULTRASOUND OF CHEST SOFT TISSUES TECHNIQUE: Ultrasound examination of the chest wall soft tissues was performed in the area of clinical concern. COMPARISON:  None Available. FINDINGS: In the left lateral chest wall, there is a small fluid collection measuring up to 1 cm. This is at the incision site. This may reflect a small postoperative seroma. In the right lateral chest wall, there is a complex cystic area measuring 9.9 x 9.0 x 3.5 cm. Internal echoes and septations. This could reflect liquified hematoma or abscess. IMPRESSION: Right lateral chest wall fluid collection measuring up to 9.9 cm with internal  echoes and septations, likely liquified hematoma or abscess. Left lateral chest wall fluid collection measuring up to 1 cm, likely postoperative seroma. Electronically Signed   By: Charlett Nose M.D.   On: 11/11/2022 21:15   [place] CHEST SOFT TISSUE  Result Date: 11/11/2022 CLINICAL DATA:  Chest swelling EXAM: ULTRASOUND OF CHEST SOFT TISSUES TECHNIQUE: Ultrasound examination of the chest wall soft tissues was performed in the area of clinical concern. COMPARISON:  None Available. FINDINGS: Targeted sonography of right lateral chest is performed in the region of swelling. Complex fluid collection, mostly anechoic but contains thin internal septations, this measures 9.8 x 3.5 by 9 cm. Mild overlying skin thickening. IMPRESSION: Status post mastectomy. Large multi-septated fluid collection within the right lateral chest measuring up to 9.8 cm, corresponding to the area of swelling. This is nonspecific in appearance and could represent postoperative collection, sterility is indeterminate by ultrasound. Electronically Signed   By: Jasmine Pang M.D.   On: 11/11/2022 20:00    Anti-infectives: Anti-infectives (From admission, onward)    Start     Dose/Rate Route Frequency Ordered Stop   11/09/22 1306  ceFEPIme (MAXIPIME) 2 g in sodium chloride 0.9 % 100 mL IVPB        2 g 200 mL/hr over 30 Minutes Intravenous Every 12 hours 11/09/22 0750 11/11/22 1634   11/09/22 1000  doxycycline (VIBRA-TABS) tablet 100 mg        100 mg Oral Every 12 hours 11/09/22 0749 11/16/22 0959   11/08/22 0000  ceFEPIme (MAXIPIME)  2 g in sodium chloride 0.9 % 100 mL IVPB  Status:  Discontinued        2 g 200 mL/hr over 30 Minutes Intravenous Every 24 hours 11/07/22 1203 11/09/22 0750   11/07/22 1200  ceFEPIme (MAXIPIME) 1 g in sodium chloride 0.9 % 100 mL IVPB  Status:  Discontinued        1 g 200 mL/hr over 30 Minutes Intravenous Every 12 hours 11/07/22 0107 11/07/22 1203   11/07/22 0715  vancomycin (VANCOREADY) IVPB 2000 mg/400 mL         2,000 mg 200 mL/hr over 120 Minutes Intravenous  Once 11/07/22 0628 11/07/22 1256   11/07/22 0633  vancomycin variable dose per unstable renal function (pharmacist dosing)  Status:  Discontinued         Does not apply See admin instructions 11/07/22 0633 11/08/22 0915   11/07/22 0115  ceFEPIme (MAXIPIME) 2 g in sodium chloride 0.9 % 100 mL IVPB        2 g 200 mL/hr over 30 Minutes Intravenous  Once 11/07/22 0107 11/07/22 0255   11/06/22 2230  cefTRIAXone (ROCEPHIN) 2 g in sodium chloride 0.9 % 100 mL IVPB  Status:  Discontinued        2 g 200 mL/hr over 30 Minutes Intravenous Every 24 hours 11/06/22 2227 11/07/22 0050   11/06/22 2230  azithromycin (ZITHROMAX) 500 mg in sodium chloride 0.9 % 250 mL IVPB  Status:  Discontinued        500 mg 250 mL/hr over 60 Minutes Intravenous Every 24 hours 11/06/22 2227 11/09/22 0749       Assessment/Plan: s/p * No surgery found * Will ask radiology to aspirate seroma right chest wall Follow up with me in next 2-3 weeks PNA per  primary team  LOS: 5 days    Chevis Pretty III 11/12/2022

## 2022-11-12 NOTE — Telephone Encounter (Signed)
Scheduled and cancelled appointments per WQ. Talked with the patients spouse Gerlene Burdock and he is aware the appointments made for the patient.

## 2022-11-12 NOTE — Consult Note (Signed)
   [facility] Inpatient Consult   11/12/2022  AHLIANA SHARIF 04/24/1961 [id number]  [facility] [[facility]]  Accountable Care Organization [ACO] Patient: Valinda Hoar [facility]  Primary Care Provider:  Fatima Sanger, FNP   Patient is currently active with [facility] [[facility]] Care Management for chronic disease management services.  Patient has been engaged by a [facility] RN.  Our community based plan of care has focused on disease management and community resource support.    Plan: Post hospital follow up with [facility] RN Care Coordinator  2:25 pm Came by to see patient she was currently off the floor in procedure, met husband at the bedside, gace a 24 hour nurse line magnet, confirms his name on DPR, Richard, and given an appointment reminder card  Of note, [facility] Care Management services does not replace or interfere with any services that are needed or arranged by inpatient [facility] care management team.   For additional questions or referrals please contact:  Charlesetta Shanks, RN BSN CCM [facility]  [phone] business mobile phone Toll free office [phone]  *Concierge Line  [phone] Fax number: [phone] [email or web address]

## 2022-11-13 ENCOUNTER — Other Ambulatory Visit: Payer: Self-pay | Admitting: Physician Assistant

## 2022-11-13 ENCOUNTER — Other Ambulatory Visit: Payer: Self-pay

## 2022-11-13 ENCOUNTER — Other Ambulatory Visit (HOSPITAL_COMMUNITY): Payer: Self-pay

## 2022-11-13 DIAGNOSIS — R5081 Fever presenting with conditions classified elsewhere: Secondary | ICD-10-CM | POA: Diagnosis not present

## 2022-11-13 DIAGNOSIS — N6489 Other specified disorders of breast: Secondary | ICD-10-CM

## 2022-11-13 DIAGNOSIS — D709 Neutropenia, unspecified: Secondary | ICD-10-CM | POA: Diagnosis not present

## 2022-11-13 DIAGNOSIS — R4182 Altered mental status, unspecified: Secondary | ICD-10-CM | POA: Diagnosis not present

## 2022-11-13 DIAGNOSIS — A419 Sepsis, unspecified organism: Secondary | ICD-10-CM | POA: Diagnosis not present

## 2022-11-13 LAB — COMPREHENSIVE METABOLIC PANEL
ALT: 13 U/L (ref 0–44)
AST: 20 U/L (ref 15–41)
Albumin: 3.1 g/dL — ABNORMAL LOW (ref 3.5–5.0)
Alkaline Phosphatase: 76 U/L (ref 38–126)
Anion gap: 13 (ref 5–15)
BUN: 14 mg/dL (ref 8–23)
CO2: 22 mmol/L (ref 22–32)
Calcium: 8.7 mg/dL — ABNORMAL LOW (ref 8.9–10.3)
Chloride: 101 mmol/L (ref 98–111)
Creatinine, Ser: 1.13 mg/dL — ABNORMAL HIGH (ref 0.44–1.00)
GFR, Estimated: 54 mL/min — ABNORMAL LOW (ref >60)
Glucose, Bld: 114 mg/dL — ABNORMAL HIGH (ref 70–99)
Potassium: 4 mmol/L (ref 3.5–5.1)
Sodium: 136 mmol/L (ref 135–145)
Total Bilirubin: 0.7 mg/dL (ref 0.3–1.2)
Total Protein: 6.1 g/dL — ABNORMAL LOW (ref 6.5–8.1)

## 2022-11-13 LAB — CBC WITH DIFFERENTIAL/PLATELET
Abs Immature Granulocytes: 4.1 10*3/uL — ABNORMAL HIGH (ref 0.00–0.07)
Basophils Absolute: 0 10*3/uL (ref 0.0–0.1)
Basophils Relative: 0 %
Blasts: 1 %
Eosinophils Absolute: 0 10*3/uL (ref 0.0–0.5)
Eosinophils Relative: 0 %
HCT: 29.9 % — ABNORMAL LOW (ref 36.0–46.0)
Hemoglobin: 9.2 g/dL — ABNORMAL LOW (ref 12.0–15.0)
Lymphocytes Relative: 7 %
Lymphs Abs: 1.6 10*3/uL (ref 0.7–4.0)
MCH: 28.1 pg (ref 26.0–34.0)
MCHC: 30.8 g/dL (ref 30.0–36.0)
MCV: 91.4 fL (ref 80.0–100.0)
Metamyelocytes Relative: 4 %
Monocytes Absolute: 0.9 10*3/uL (ref 0.1–1.0)
Monocytes Relative: 4 %
Myelocytes: 10 %
Neutro Abs: 15.8 10*3/uL — ABNORMAL HIGH (ref 1.7–7.7)
Neutrophils Relative %: 70 %
Platelets: 371 10*3/uL (ref 150–400)
Promyelocytes Relative: 4 %
RBC: 3.27 MIL/uL — ABNORMAL LOW (ref 3.87–5.11)
RDW: 21.2 % — ABNORMAL HIGH (ref 11.5–15.5)
WBC: 22.6 10*3/uL — ABNORMAL HIGH (ref 4.0–10.5)
nRBC: 0.6 % — ABNORMAL HIGH (ref 0.0–0.2)
nRBC: 2 /100 WBC — ABNORMAL HIGH

## 2022-11-13 LAB — CULTURE, BODY FLUID W GRAM STAIN -BOTTLE

## 2022-11-13 LAB — GLUCOSE, CAPILLARY
Glucose-Capillary: 119 mg/dL — ABNORMAL HIGH (ref 70–99)
Glucose-Capillary: 106 mg/dL — ABNORMAL HIGH (ref 70–99)

## 2022-11-13 MED ORDER — DOXYCYCLINE HYCLATE 100 MG PO TABS: 100.0000 mg | ORAL_TABLET | Freq: Two times a day (BID) | ORAL | 0 refills | Status: AC

## 2022-11-13 MED ORDER — APIXABAN 5 MG PO TABS: 10.0000 mg | ORAL_TABLET | Freq: Two times a day (BID) | ORAL | 0 refills | Status: DC

## 2022-11-13 MED ORDER — APIXABAN 5 MG PO TABS
ORAL_TABLET | ORAL | 0 refills | Status: DC
  Filled 2022-11-13: qty 64, 30d supply, fill #0

## 2022-11-13 MED ORDER — APIXABAN 5 MG PO TABS: 5.0000 mg | ORAL_TABLET | Freq: Two times a day (BID) | ORAL | 0 refills | Status: DC

## 2022-11-13 NOTE — Discharge Instructions (Addendum)
Interventional Radiology Percutaneous Abscess Drain Placement After CareThis sheet gives you information about how to care for yourself after your procedure. Your health care provider may also give you more specific instructions. Your drain was placed by an interventional radiologist with [facility] Rad[facility]. If you have questions or concerns, contact [facility] Radiology at [phone].What is a percutaneous drain?? A drain is a small plastic tube (catheter) that goes into the fluid collection in your body through your skin.How long will I need the drain?? How long the drain needs to stay in is determined by where the drain is, how much comes out of the drain each day and if you are having any other surgical procedures.? Interventional radiology will determine when it is time to remove the drain. It is important to follow up as directed so that the drain can be removed as soon as it is safe to do so.What can I expect after the procedure?After the procedure, it is common to have:? A small amount of bruising and discomfort in the area where the drainage tube (catheter) was placed.? Sleepiness and fatigue. This should go away after the medicines you were given have worn off.Follow these instructions at home:Insertion site care? Check your insertion site when you change the bandage. Check for:? More redness, swelling, or pain.? More fluid or blood.? Warmth.? Pus or a bad smell.? When caring for your insertion site:? Wash your hands with soap and water for at least 20 seconds before and after you change your bandage (dressing). If soap and water are not available, use hand sanitizer. ? You do not need to change your dressing everyday if it is clean and dry. Change your dressing every 3 days or as needed when it is soiled, wet or becoming dislodged. You will need to change your dressing each time you shower.? Leave stitches (sutures), skin glue, or adhesive strips in place. These skin closures may need to stay in  place for 2 weeks or longer. If adhesive strip edges start to loosen and curl up, you may trim the loose edges. Do not remove adhesive strips completely unless your health care provider tells you to do so.Catheter care? Flush the catheter once per day with 5 mL of 0.9% normal saline unless you are told otherwise by your healthcare provider. This helps to prevent clogs in the catheter.? To disconnect the drain, turn the clear plastic tube to the left. Attach the saline syringe by placing it on the white end of the drain and turning gently to the right. Once attached gently push the plunger to the 5 mL mark. After you are done flushing, disconnect the syringe by turning to the left and reattach your drainage container ? If you have a bulb please be sure the bulb is charged after reconnecting it - to do this pinch the bulb between your thumb and first finger and close the stopper located on the top of the bulb. ? Check for fluid leaking from around your catheter (instead of fluid draining through your catheter). This may be a sign that the drain is no longer working correctly.? Write down the following information every time you empty your bag:? The date and time.? The amount of drainage.Activity ? Rest at home for 1-2 days after your procedure.? For the first 48 hours do not lift anything more than 10 lbs (about a gallon of milk). You may perform moderate activities/exercise. Please avoid strenuous activities during this time.? Avoid any activities which may pull on your drain as this  can cause your drain to become dislodged.? If you were given a sedative during the procedure, it can affect you for several hours. Do not drive or operate machinery until your health care provider says that it is safe.General instructions? For mild pain take over-the-counter medications as needed for pain such as Tylenol or Advil. If you are experiencing severe pain please call our office as this may indicate an issue with your drain.  ? If you were prescribed an antibiotic medicine, take it as told by your health care provider. Do not stop using the antibiotic even if you start to feel better.? You may shower 24 hours after the drain is placed. To do this cover the insertion site with a water tight material such as saran wrap and seal the edges with tape, you may also purchase waterproof dressings at your local drug store. Shower as usual and then remove the water tight dressing and any gauze/tape underneath it once you have exited the shower and dried off. Allow the area to air dry or pat dry with a clean towel. Once the skin is completely dry place a new gauze dressing. It is important to keep the site dry at all times to prevent infection.? Do not submerge the drain - this means you cannot take baths, swim, use a hot tub, etc. until the drain is removed. ? Do not use any products that contain nicotine or tobacco, such as cigarettes, e-cigarettes, and chewing tobacco. If you need help quitting, ask your health care provider.? Keep all follow-up visits as told by your health care provider. This is important.Contact a health care provider if:? You have less than 10 mL of drainage a day for 2-3 days in a row, or as directed by your health care provider.? You have any of these signs of infection:? More redness, swelling, or pain around your incision area.? More fluid or blood coming from your incision area.? Warmth coming from your incision area.? Pus or a bad smell coming from your incision area. ? You have fluid leaking from around your catheter (instead of through your catheter).? You are unable to flush the drain.? You have a fever or chills.? You have pain that does not get better with medicine.? You have not been contacted to schedule a drain follow up appointment within 10 days of discharge from the hospital.Please call [facility] Radiology at [phone] with any questions or concerns.Get help right away if:? Your catheter comes out.?  You suddenly stop having drainage from your catheter.? You suddenly have blood in the fluid that is draining from your catheter.? You become dizzy or you faint.? You develop a rash.? You have nausea or vomiting.? You have difficulty breathing or you feel short of breath.? You develop chest pain.? You have problems with your speech or vision.? You have trouble balancing or moving your arms or legs.Summary? It is common to have a small amount of bruising and discomfort in the area where the drainage tube (catheter) was placed. You may also have minor discomfort with movement while the drain is in place.? Flush the drain once per day with 5 mL of 0.9% normal saline (unless you were told otherwise by your healthcare provider). ? Record the amount of drainage from the bag every time you empty it.? Change the dressing every 3 days or earlier if soiled/wet. Keep the skin dry under the dressing. ? You may shower with the drain in place. Do not submerge the drain (no baths, swimming, hot tubs, etc.).?  Contact [place] [facility] at [phone] if you have more redness, swelling, or pain around your incision area or if you have pain that does not get better with medicine.This information is not intended to replace advice given to you by your health care provider. Make sure you discuss any questions you have with your health care provider.Document Revised: 09/03/2021 Document Reviewed: 12/12/2020Elsevier Patient Education  2023 [employer] Interventional Radiology Drain RecordEmpty your drain at least once per day. You may empty it as often as needed. Use this form to write down the amount of fluid that has collected in the drainage container. Bring this form with you to your follow-up visits. Please call [facility] Radiology at [phone] with any questions or concerns prior to your appointment.Drain #1 location: ___________________Date __________ Time __________ Amount __________Date __________ Time __________  Amount __________Date __________ Time __________ Amount __________Date __________ Time __________ Amount __________Date __________ Time __________ Amount __________Date __________ Time __________ Amount __________Date __________ Time __________ Amount __________Date __________ Time __________ Amount __________Date __________ Time __________ Amount __________Date __________ Time __________ Amount __________Date __________ Time __________ Amount __________Date __________ Time __________ Amount __________Date __________ Time __________ Amount __________Date __________ Time __________ Amount __________ Horace Porteous #2 location: ___________________Date __________ Time __________ Amount __________Date __________ Time __________ Amount __________Date __________ Time __________ Amount __________Date __________ Time __________ Amount __________Date __________ Time __________ Amount __________Date __________ Time __________ Amount __________Date __________ Time __________ Amount __________Date __________ Time __________ Amount __________Date __________ Time __________ Amount __________Date __________ Time __________ Amount __________Date __________ Time __________ Amount __________Date __________ Time __________ Amount __________Date __________ Time __________ Amount __________Date __________ Time __________ Amount __________  Information on my medicine - ELIQUIS (apixaban)  This medication education was reviewed with me or my healthcare representative as part of my discharge preparation.   Why was Eliquis prescribed for you? Eliquis was prescribed to treat blood clots that may have been found in the veins of your legs (deep vein thrombosis) or in your lungs (pulmonary embolism) and to reduce the risk of them occurring again.  What do You need to know about Eliquis ? The starting dose is 10 mg (two 5 mg tablets) taken TWICE daily for the FIRST SEVEN (7) DAYS, then on 11/16/2022  the dose is reduced to ONE 5 mg tablet  taken TWICE daily.  Eliquis may be taken with or without food.   Try to take the dose about the same time in the morning and in the evening. If you have difficulty swallowing the tablet whole please discuss with your pharmacist how to take the medication safely.  Take Eliquis exactly as prescribed and DO NOT stop taking Eliquis without talking to the doctor who prescribed the medication.  Stopping may increase your risk of developing a new blood clot.  Refill your prescription before you run out.  After discharge, you should have regular check-up appointments with your healthcare provider that is prescribing your Eliquis.    What do you do if you miss a dose? If a dose of ELIQUIS is not taken at the scheduled time, take it as soon as possible on the same day and twice-daily administration should be resumed. The dose should not be doubled to make up for a missed dose.  Important Safety Information A possible side effect of Eliquis is bleeding. You should call your healthcare provider right away if you experience any of the following: Bleeding from an injury or your nose that does not stop. Unusual colored urine (red or dark brown) or unusual colored  stools (red or black). Unusual bruising for unknown reasons. A serious fall or if you hit your head (even if there is no bleeding).  Some medicines may interact with Eliquis and might increase your risk of bleeding or clotting while on Eliquis. To help avoid this, consult your healthcare provider or pharmacist prior to using any new prescription or non-prescription medications, including herbals, vitamins, non-steroidal anti-inflammatory drugs (NSAIDs) and supplements.  This website has more information on Eliquis (apixaban): http://www.eliquis.com/eliquis/home

## 2022-11-13 NOTE — TOC Transition Note (Signed)
Transition of Care [facility]) - CM/SW Discharge Note   Patient Details  Name: [name] MRN: [id number] Date of Birth: 18-Apr-1961  [facility] CM/SW Contact:  Lawerance Sabal, RN Phone Number: 11/13/2022, 1:02 PM   Clinical Narrative:     Notified HH provider that patient will DC today.  Provided with Eliquis cards    Barriers to Discharge: Continued Medical Work up   Patient Goals and CMS Choice CMS Medicare.gov Compare Post Acute Care list provided to:: Patient Choice offered to / list presented to : Patient  Discharge Placement                         Discharge Plan and Services Additional resources added to the After Visit Summary for       Post Acute Care Choice: Durable Medical Equipment, Home Health          DME Arranged: Wheelchair manual DME Agency: [employer] Date DME Agency Contacted: 11/10/22 Time DME Agency Contacted: 1012 Representative spoke with at DME Agency: Vaughan Basta HH Arranged: PT HH Agency: [facility] Date [facility] Agency Contacted: 11/10/22 Time HH Agency Contacted: 1013 Representative spoke with at [facility] Agency: Eber Jones  Social Determinants of Health (SDOH) Interventions SDOH Screenings   Food Insecurity: No Food Insecurity (06/19/2022)  Housing: Low Risk  (06/19/2022)  Transportation Needs: No Transportation Needs (09/06/2022)  Utilities: Not At Risk (09/06/2022)  Depression (PHQ2-9): Low Risk  (09/06/2022)  Tobacco Use: Medium Risk (11/12/2022)     Readmission Risk Interventions     No data to display

## 2022-11-13 NOTE — Discharge Summary (Signed)
Physician Discharge Summary   Patient: [name] MRN: [id number] DOB: 1960/10/10  Admit date:     11/06/2022  Discharge date: 11/13/22  Discharge Physician: Rickey Barbara   PCP: Fatima Sanger, FNP   Recommendations at discharge:    Follow up with PCP in 1-2 weeks Follow up with IR as scheduled Followed up with Dr. Carolynne Edouard as scheduled Follow up with Dr. Pamelia Hoit as scheduled  Discharge Diagnoses: Principal Problem:   Febrile neutropenia (HCC) Active Problems:   Severe sepsis (HCC)   Acute encephalopathy   Acute hypoxemic respiratory failure (HCC)   AKI (acute kidney injury) (HCC)   SYNDROME, CHRONIC PAIN   Essential hypertension   Type 2 diabetes mellitus with hyperglycemia (HCC)   Localized swelling of right lower extremity  Resolved Problems:   * No resolved hospital problems. [facility] Course: [phone] with hx DM2, breast cancer undergoing chemo and s/p mastectomy, asthma, HTN, HLD presented with toxic metaboilc encephalopathy. Pt was found to have PNA with septic shock requiring pressor support. Pressors were weaned and pt improved. Pt's care was transferred to [facility]  Assessment and Plan: Septic shock with severe sepsis secondary to PNA present on admit -Pt is continued on cefepime anticipated through 5/30 -Clinically improved -WBC had trended up after filgrastim x 3 days -complete doxycycline on d/c   Febrile neutropenia -Presented neutropenic -Pt is s/p 3 days of filgrastim  -WBC now up to  22.6k. Pt has since remained afebrile -Neutropenia resolved   ARF -Renal function continues to improve   Acute toxic metabolic encephalopathy -Noted to be confused after transfer out of ICU -Pt reportedly hadn't slept well, ultimately improving with seroquel and xanax at night -mentation improved by day of d/c  DM2 -Glycemic trends stable -continue on SSI as needed   DVT -continue on eliquis   Anemia of chronic disease -Hemodynamically stable   Breast  cancer -Pt to follow up with Dr. Pamelia Hoit on d/c   Breast seroma -Pt complained of increased swelling and concerned about fluid collection. Examined with female RN in room present -overlying area does not appear erythematous.  -Chest [place] confirms 9.9cm R sided fluid collection -IR was consulted and pt is now s/p drain placement -Pt to follow up with IR and Dr. Carolynne Edouard on d/c     Consultants: PCCM, General Surgery, IR Procedures performed: Drain placement to R breast by IR  Disposition: Home Diet recommendation:  Cardiac diet DISCHARGE MEDICATION: Allergies as of 11/13/2022       Reactions   Codeine Phosphate Other (See Comments)   Gi upset   Lipitor [atorvastatin]    Joint pain   Simvastatin    REACTION: muscle \\T \ joint pains   Sulfamethoxazole Hives   Tape Rash   Adhesive. Pt states "it takes skin off" Not even paper tape        Medication List     STOP taking these medications    olmesartan 40 MG tablet Commonly known as: BENICAR   pregabalin 100 MG capsule Commonly known as: LYRICA       TAKE these medications    albuterol 108 (90 Base) MCG/ACT inhaler Commonly known as: VENTOLIN HFA Inhale 2 puffs into the lungs every 6 (six) hours as needed for wheezing or shortness of breath.   ALPRAZolam 0.5 MG tablet Commonly known as: XANAX Take 0.5 mg by mouth 2 (two) times daily as needed for sleep or anxiety.   amitriptyline 50 MG tablet Commonly known as: ELAVIL Take 50 mg  by mouth at bedtime.   amLODipine 10 MG tablet Commonly known as: NORVASC Take 10 mg by mouth daily.   apixaban 5 MG Tabs tablet Commonly known as: ELIQUIS Take 2 tablets (10 mg total) by mouth 2 (two) times daily for 3 days.   apixaban 5 MG Tabs tablet Commonly known as: ELIQUIS Take 1 tablet (5 mg total) by mouth 2 (two) times daily. Start taking on: November 16, 2022   doxycycline 100 MG tablet Commonly known as: VIBRA-TABS Take 1 tablet (100 mg total) by mouth every 12 (twelve)  hours for 4 days.   ezetimibe 10 MG tablet Commonly known as: ZETIA Take 10 mg by mouth daily.   FLINTSTONES PLUS EXTRA IRON PO Take 1 tablet by mouth daily.   lidocaine-prilocaine cream Commonly known as: EMLA Apply to affected area once   loratadine 10 MG tablet Commonly known as: CLARITIN Take 10 mg by mouth daily.   magic mouthwash w/lidocaine Soln Take 5 mLs by mouth 4 (four) times daily as needed for mouth pain.   metFORMIN 500 MG 24 hr tablet Commonly known as: GLUCOPHAGE-XR Take 1,000 mg by mouth 2 (two) times daily.   Mounjaro 5 MG/0.5ML Pen Generic drug: tirzepatide Inject 5 mg into the skin every Monday.   ondansetron 8 MG tablet Commonly known as: ZOFRAN TAKE 1 TABLET EVERY 8HRS AS NEEDED FOR NAUSEA OR VOMITING. START ON THE 3RD DAY AFTER CHEMO What changed: See the new instructions.   pantoprazole 40 MG tablet Commonly known as: PROTONIX Take 40 mg by mouth daily.   promethazine 25 MG tablet Commonly known as: PHENERGAN TAKE 1 TABLET BY MOUTH EVERY 6 HOURS AS NEEDED FOR NAUSEA OR VOMITING.   Xtampza ER 36 MG C12a Generic drug: oxyCODONE ER Take 36 mg by mouth in the morning and at bedtime.               Durable Medical Equipment  (From admission, onward)           Start     Ordered   11/10/22 1009  For home use only DME standard manual wheelchair with seat cushion  Once       Comments: Patient suffers from breast cancer, sepsis, pna, Altered mental status, weakness which impairs their ability to perform daily activities like dressing and grooming in the home.  A walker will not resolve issue with performing activities of daily living. A wheelchair will allow patient to safely perform daily activities. Patient can safely propel the wheelchair in the home or has a caregiver who can provide assistance. Length of need Lifetime. Accessories: elevating leg rests (ELRs), wheel locks, extensions and anti-tippers.   11/10/22 1009             Follow-up Information     [facility] Follow up.   Why: Home health has been arranged. They will contact you within 48hours post discharge to schedule apt. Contact information: [address] [place] [address] [phone]         Fatima Sanger, FNP Follow up in 2 week(s).   Specialty: Internal Medicine Why: Hospital follow up Contact information: [address] [place] [address] [phone]         Serena Croissant, MD Follow up.   Specialty: Hematology and Oncology Why: as scheduled Contact information: [address] [place] [address] [phone]         [facility] INTERVENTIONAL RADIOLOGY Follow up.  Specialty: Radiology Why: as scheduled Contact information: [address] [id number] [address]he[place] Bonito [place] [address] [phone]        Chevis Pretty III, MD Follow up in 2 week(s).   Specialty: General Surgery Why: Hospital follow up Contact information: [address] [place] [address] [phone]                Discharge Exam: Ceasar Mons Weights   11/07/22 0100 11/07/22 0400 11/07/22 0709  Weight: 97.5 kg 93.7 kg 94 kg   General exam: Awake, laying in bed, in nad Respiratory system: Normal respiratory effort, no wheezing Cardiovascular system: regular rate, s1, s2 Gastrointestinal system: Soft, nondistended, positive BS Central nervous system: CN2-12 grossly intact, strength intact Extremities: Perfused, no clubbing Skin: Normal skin turgor, no notable skin lesions seen Psychiatry: Mood normal // no visual hallucinations   Condition at discharge: fair  The results of significant diagnostics from this hospitalization (including imaging, microbiology, ancillary and laboratory) are listed below for reference.   Imaging Studies: IR US Guide Bx Asp/Drain  Result Date: 11/12/2022 INDICATION: RIGHT MASTECTOMY SITE  SUPERFICIAL POSTOPERATIVE SEROMA EXAM: ULTRASOUND RIGHT MASTECTOMY SITE SEROMA DRAIN MEDICATIONS: 1% lidocaine local ANESTHESIA/SEDATION: Moderate (conscious) sedation was employed during this procedure. A total of Versed 1.0 mg and Fentanyl 50 mcg was administered intravenously by the radiology nurse. Total intra-service moderate Sedation Time: 10 minutes. The patient's level of consciousness and vital signs were monitored continuously by radiology nursing throughout the procedure under my direct supervision. COMPLICATIONS: None immediate. PROCEDURE: Informed written consent was obtained from the patient after a thorough discussion of the procedural risks, benefits and alternatives. All questions were addressed. Maximal Sterile Barrier Technique was utilized including caps, mask, sterile gowns, sterile gloves, sterile drape, hand hygiene and skin antiseptic. A timeout was performed prior to the initiation of the procedure. Previous imaging reviewed. Preliminary ultrasound performed. The right breast superficial seroma was marked for access. Under sterile conditions local anesthesia, the 18 gauge introducer needle was advanced percutaneously into the seroma. Needle position confirmed with ultrasound. Images obtained for documentation. Guidewire inserted followed by tract dilatation to insert a 10 [place] drain. Drain catheter position confirmed with ultrasound. Syringe aspiration yielded 120 cc clear serosanguineous seroma fluid. Sample sent for culture. This completely collapsed the seroma. Catheter secured with Prolene suture and connected to external suction bulb. Sterile dressing applied. No immediate complication. Patient tolerated the procedure well. IMPRESSION: Successful ultrasound right mastectomy site seroma drain placement. Sample sent for culture. Electronically Signed   By: Judie Petit.  Shick M.D.   On: 11/12/2022 14:36   [place] CHEST SOFT TISSUE  Result Date: 11/11/2022 CLINICAL DATA:  Bilateral total mastectomy  07/19/2022. Breast swelling. EXAM: ULTRASOUND OF CHEST SOFT TISSUES TECHNIQUE: Ultrasound examination of the chest wall soft tissues was performed in the area of clinical concern. COMPARISON:  None Available. FINDINGS: In the left lateral chest wall, there is a small fluid collection measuring up to 1 cm. This is at the incision site. This may reflect a small postoperative seroma. In the right lateral chest wall, there is a complex cystic area measuring 9.9 x 9.0 x 3.5 cm. Internal echoes and septations. This could reflect liquified hematoma or abscess. IMPRESSION: Right lateral chest wall fluid collection measuring up to 9.9 cm with internal echoes and septations, likely liquified hematoma or abscess. Left lateral chest wall fluid collection measuring up to 1 cm, likely postoperative seroma. Electronically Signed   By: Charlett Nose M.D.   On: 11/11/2022 21:15  [place] CHEST SOFT TISSUE  Result Date: 11/11/2022 CLINICAL DATA:  Chest swelling EXAM: ULTRASOUND OF CHEST SOFT TISSUES TECHNIQUE: Ultrasound examination of the chest wall soft tissues was performed in the area of clinical concern. COMPARISON:  None Available. FINDINGS: Targeted sonography of right lateral chest is performed in the region of swelling. Complex fluid collection, mostly anechoic but contains thin internal septations, this measures 9.8 x 3.5 by 9 cm. Mild overlying skin thickening. IMPRESSION: Status post mastectomy. Large multi-septated fluid collection within the right lateral chest measuring up to 9.8 cm, corresponding to the area of swelling. This is nonspecific in appearance and could represent postoperative collection, sterility is indeterminate by ultrasound. Electronically Signed   By: Jasmine Pang M.D.   On: 11/11/2022 20:00   VAS [place] LOWER EXTREMITY VENOUS (DVT)  Result Date: 11/08/2022  Lower Venous DVT Study Patient Name:  ALEVIA HANAWAY  Date of Exam:   11/07/2022 Medical Rec #: [id number]        Accession #:    [id number] Date  of Birth: [birth year]        Patient Gender: F Patient Age:   36 years Exam Location:  [facility] Procedure:      VAS [place] LOWER EXTREMITY VENOUS (DVT) Referring Phys: Lyda Perone --------------------------------------------------------------------------------  Indications: Swelling, Pain, and Elevated D-Dimer.  Risk Factors: Breast Cancer, status post bilateral mastectomy. On Chemo. Limitations: Edema. Comparison Study: No prior study on file Performing Technologist: Sherren Kerns RVS  Examination Guidelines: A complete evaluation includes B-mode imaging, spectral Doppler, color Doppler, and power Doppler as needed of all accessible portions of each vessel. Bilateral testing is considered an integral part of a complete examination. Limited examinations for reoccurring indications may be performed as noted. The reflux portion of the exam is performed with the patient in reverse Trendelenburg.  +---------+---------------+---------+-----------+---------------+--------------+ RIGHT    CompressibilityPhasicitySpontaneityProperties     Thrombus Aging +---------+---------------+---------+-----------+---------------+--------------+ CFV      Full                               pulsatile                                                                 waveforms                     +---------+---------------+---------+-----------+---------------+--------------+ SFJ      Full                                                             +---------+---------------+---------+-----------+---------------+--------------+ FV Prox  Full                                                             +---------+---------------+---------+-----------+---------------+--------------+ FV Mid   Full  pulsatile                                                                 waveforms                      +---------+---------------+---------+-----------+---------------+--------------+ FV DistalFull                                                             +---------+---------------+---------+-----------+---------------+--------------+ PFV      Full                                                             +---------+---------------+---------+-----------+---------------+--------------+ POP      Full                               pulsatile                                                                 waveforms                     +---------+---------------+---------+-----------+---------------+--------------+ PTV                                                        Not well                                                                  visualized     +---------+---------------+---------+-----------+---------------+--------------+ PERO     None                                              Acute          +---------+---------------+---------+-----------+---------------+--------------+   +---------+---------------+---------+-----------+---------------+--------------+ LEFT     CompressibilityPhasicitySpontaneityProperties     Thrombus Aging +---------+---------------+---------+-----------+---------------+--------------+ CFV      Full                               pulsatile  waveforms                     +---------+---------------+---------+-----------+---------------+--------------+ SFJ      Full                                                             +---------+---------------+---------+-----------+---------------+--------------+ FV Prox  Full                                                             +---------+---------------+---------+-----------+---------------+--------------+ FV Mid   Full                               pulsatile                                                                  waveforms                     +---------+---------------+---------+-----------+---------------+--------------+ FV DistalFull                                                             +---------+---------------+---------+-----------+---------------+--------------+ PFV      Full                                                             +---------+---------------+---------+-----------+---------------+--------------+ POP                                         pulsatile      patent by                                                  waveforms      color and                                                                 Doppler        +---------+---------------+---------+-----------+---------------+--------------+ PTV  Not well                                                                  visualized     +---------+---------------+---------+-----------+---------------+--------------+ PERO     None                                              Acute          +---------+---------------+---------+-----------+---------------+--------------+     Summary: RIGHT: - Findings consistent with acute deep vein thrombosis involving the right peroneal veins. - Technically difficult and limited exam in the calf, however, the peroneal veins appear dilated, do not fully compress, and are absent of color  LEFT: - Findings consistent with acute deep vein thrombosis involving the left peroneal veins. - Technically difficult and limited exam in the calf, however, the peroneal veins appear dilated, do not fully compress, and are absent of color  *See table(s) above for measurements and observations. Electronically signed by Waverly Ferrari MD on 11/08/2022 at 9:29:33 AM.    Final    US RENAL  Result Date: 11/07/2022 CLINICAL DATA:  Acute kidney injury EXAM: RENAL / URINARY  TRACT ULTRASOUND COMPLETE COMPARISON:  Abdominal CT 06/19/2022 FINDINGS: Right Kidney: Not sonographically visualized, severely atrophic and likely nonfunctional by prior CT. Left Kidney: volume: 180 mL. Echogenicity within normal limits. No mass or hydronephrosis visualized. Bladder: Collapsed at time of imaging. Other: Echogenic liver from steatosis, confirmed on prior CT IMPRESSION: 1. Severe right renal atrophy with normal appearing left kidney. 2. Hepatic steatosis. Electronically Signed   By: Tiburcio Pea M.D.   On: 11/07/2022 09:17   DG Chest Portable 1 View  Result Date: 11/06/2022 CLINICAL DATA:  Weakness and confusion. EXAM: PORTABLE CHEST 1 VIEW COMPARISON:  August 12, 2022 FINDINGS: Stable right-sided venous Port-A-Cath positioning is seen. The heart size and mediastinal contours are within normal limits. Mild atelectasis is seen within the bilateral lung bases. There is no evidence of focal consolidation, pleural effusion or pneumothorax. Radiopaque surgical clips are seen overlying the lateral chest wall soft tissues, bilaterally. Multilevel degenerative changes are noted throughout the thoracic spine. IMPRESSION: Mild bibasilar atelectasis. Electronically Signed   By: Aram Candela M.D.   On: 11/06/2022 23:08    Microbiology: Results for orders placed or performed during the hospital encounter of 11/06/22  Blood Culture (routine x 2)     Status: None   Collection Time: 11/06/22 10:50 PM   Specimen: BLOOD  Result Value Ref Range Status   Specimen Description BLOOD RIGHT ARM  Final   Special Requests   Final    BOTTLES DRAWN AEROBIC AND ANAEROBIC Blood Culture adequate volume   Culture   Final    NO GROWTH 5 DAYS Performed at [facility], [address] [place], [place] [address]    Report Status 11/11/2022 FINAL  Final  Resp panel by RT-PCR (RSV, Flu A&B, Covid) Anterior Nasal Swab     Status: None   Collection Time: 11/06/22 11:05 PM   Specimen: Anterior Nasal  Swab  Result Value Ref Range Status   SARS Coronavirus 2 by RT PCR NEGATIVE NEGATIVE  Final   Influenza A by PCR NEGATIVE NEGATIVE Final   Influenza B by PCR NEGATIVE NEGATIVE Final    Comment: (NOTE) The Xpert Xpress SARS-CoV-2/FLU/RSV plus assay is intended as an aid in the diagnosis of influenza from Nasopharyngeal swab specimens and should not be used as a sole basis for treatment. Nasal washings and aspirates are unacceptable for Xpert Xpress SARS-CoV-2/FLU/RSV testing.  Fact Sheet for Patients: BloggerCourse.com  Fact Sheet for Healthcare Providers: SeriousBroker.it  This test is not yet approved or cleared by the [place] FDA and has been authorized for detection and/or diagnosis of SARS-CoV-2 by FDA under an Emergency Use Authorization (EUA). This EUA will remain in effect (meaning this test can be used) for the duration of the COVID-19 declaration under Section 564(b)(1) of the Act, 21 U.S.C. section 360bbb-3(b)(1), unless the authorization is terminated or revoked.     Resp Syncytial Virus by PCR NEGATIVE NEGATIVE Final    Comment: (NOTE) Fact Sheet for Patients: BloggerCourse.com  Fact Sheet for Healthcare Providers: SeriousBroker.it  This test is not yet approved or cleared by the [place] FDA and has been authorized for detection and/or diagnosis of SARS-CoV-2 by FDA under an Emergency Use Authorization (EUA). This EUA will remain in effect (meaning this test can be used) for the duration of the COVID-19 declaration under Section 564(b)(1) of the Act, 21 U.S.C. section 360bbb-3(b)(1), unless the authorization is terminated or revoked.  Performed at [facility], [address] [place], [place] [address]   Blood Culture (routine x 2)     Status: None   Collection Time: 11/06/22 11:05 PM   Specimen: BLOOD RIGHT HAND  Result Value Ref Range  Status   Specimen Description BLOOD RIGHT HAND  Final   Special Requests   Final    BOTTLES DRAWN AEROBIC AND ANAEROBIC Blood Culture adequate volume   Culture   Final    NO GROWTH 5 DAYS Performed at [facility], [address], [place], [place] [address]    Report Status 11/11/2022 FINAL  Final  MRSA Next Gen by PCR, Nasal     Status: None   Collection Time: 11/07/22  7:38 AM   Specimen: Nasal Mucosa; Nasal Swab  Result Value Ref Range Status   MRSA by PCR Next Gen NOT DETECTED NOT DETECTED Final    Comment: (NOTE) The GeneXpert MRSA Assay (FDA approved for NASAL specimens only), is one component of a comprehensive MRSA colonization surveillance program. It is not intended to diagnose MRSA infection nor to guide or monitor treatment for MRSA infections. Test performance is not FDA approved in patients less than 46 years old. Performed at [facility], [address] [place], [place] [address]   Culture, body fluid w Gram Stain-bottle     Status: None (Preliminary result)   Collection Time: 11/12/22  2:32 PM   Specimen: Path fluid  Result Value Ref Range Status   Specimen Description FLUID  Final   Special Requests RIGHT BREAST  Final   Culture   Final    NO GROWTH < 24 HOURS Performed at [facility], [address], [place], [place] [address]    Report Status PENDING  Incomplete  Gram stain     Status: None   Collection Time: 11/12/22  2:32 PM   Specimen: Path fluid  Result Value Ref Range Status   Specimen Description FLUID  Final   Special Requests RIGHT BREAST  Final   Gram Stain  Final    NO WBC SEEN NO ORGANISMS SEEN Performed at [facility], [address] [place], [place] [address]    Report Status 11/12/2022 FINAL  Final    Labs: CBC: Recent Labs  Lab 11/09/22 0217 11/10/22 0330 11/11/22 0306 11/12/22 0218 11/13/22 0340  WBC 6.2 13.9* 16.3* 19.4* 22.6*  NEUTROABS 3.0 11.1* 11.9* 16.7* 15.8*  HGB 8.5* 9.3* 9.3* 9.5*  9.2*  HCT 27.8* 30.2* 29.9* 31.1* 29.9*  MCV 93.3 90.4 92.3 90.4 91.4  PLT 415* 493* 427* 406* 371   Basic Metabolic Panel: Recent Labs  Lab 11/07/22 1407 11/09/22 0217 11/11/22 0306 11/12/22 0218 11/13/22 0340  NA 132* 135 136 135 136  K 5.4* 5.1 3.8 3.3* 4.0  CL 100 102 99 100 101  CO2 24 25 25 24 22   GLUCOSE 112* 121* 116* 122* 114*  BUN 34* 22 13 13 14   CREATININE 3.40* 2.13* 1.35* 1.19* 1.13*  CALCIUM 7.7* 7.9* 8.9 8.9 8.7*   Liver Function Tests: Recent Labs  Lab 11/06/22 2018 11/07/22 0202 11/11/22 0306 11/12/22 0218 11/13/22 0340  AST 18 14* 19 18 20   ALT 15 15 14 14 13   ALKPHOS 60 49 90 96 76  BILITOT 0.8 0.6 0.7 0.6 0.7  PROT 6.8 5.8* 6.0* 6.1* 6.1*  ALBUMIN 3.5 2.9* 2.9* 3.0* 3.1*   CBG: Recent Labs  Lab 11/12/22 1142 11/12/22 1600 11/12/22 2224 11/13/22 0755 11/13/22 1226  GLUCAP 117* 122* 115* 119* 106*    Discharge time spent: less than 30 minutes.  Signed: Rickey Barbara, MD [facility] 11/13/2022

## 2022-11-14 LAB — CULTURE, BODY FLUID W GRAM STAIN -BOTTLE

## 2022-11-15 ENCOUNTER — Telehealth: Payer: Self-pay

## 2022-11-15 NOTE — Patient Outreach (Signed)
  Care Coordination   Follow Up Visit Note   11/15/2022 Name: TIMIKIA SCHELLE MRN: [id number] DOB: [birth year]  [name] is a 62 y.o. year old female who sees Larose Hires, Gaylyn Lambert, FNP for primary care. I spoke with  [name] by phone today.  What matters to the patients health and wellness today?  Recovery from pneumonia and getting back on track with chemo    Goals Addressed             This Visit's Progress    Beating Breast Cancer       Patient Goals/Self Care Activities: -Patient/Caregiver will self-administer medications as prescribed as evidenced by self-report/primary caregiver report  -Patient/Caregiver will attend all scheduled provider appointments as evidenced by clinician review of documented attendance to scheduled appointments and patient/caregiver report -Patient/Caregiver will call provider office for new concerns or questions as evidenced by review of documented incoming telephone call notes and patient report  -check blood sugar at prescribed times -record values and write them down take them to all doctor visits   -Patient in current treatment for breast cancer- Drain tube replaced in the hospital due to fluid accumulation.  Patient has 2 more chemo treatments.  Discussed care while in treatment.  Chemo on 6/7/ & 6/28-no problems with transportation.  Patient reports no problems with Blood sugars.         SDOH assessments and interventions completed:  Yes     Care Coordination Interventions:  Yes, provided   Follow up plan: Follow up call scheduled for June    Encounter Outcome:  Pt. Visit Completed   Bary Leriche, RN, MSN [facility] Care Management Care Management Coordinator Direct Line [phone]

## 2022-11-15 NOTE — Patient Instructions (Signed)
Visit Information  Thank you for taking time to visit with me today. Please don't hesitate to contact me if I can be of assistance to you.   Following are the goals we discussed today:   Goals Addressed             This Visit's Progress    Beating Breast Cancer       Patient Goals/Self Care Activities: -Patient/Caregiver will self-administer medications as prescribed as evidenced by self-report/primary caregiver report  -Patient/Caregiver will attend all scheduled provider appointments as evidenced by clinician review of documented attendance to scheduled appointments and patient/caregiver report -Patient/Caregiver will call provider office for new concerns or questions as evidenced by review of documented incoming telephone call notes and patient report  -check blood sugar at prescribed times -record values and write them down take them to all doctor visits   -Patient in current treatment for breast cancer- Drain tube replaced in the hospital due to fluid accumulation.  Patient has 2 more chemo treatments.  Discussed care while in treatment.  Chemo on 6/7/ & 6/28-no problems with transportation.  Patient reports no problems with Blood sugars.         Our next appointment is by telephone on 11/29/22 at 1030 am  Please call the care guide team at [phone] if you need to cancel or reschedule your appointment.   If you are experiencing a Mental Health or Behavioral Health Crisis or need someone to talk to, please call the Suicide and Crisis Lifeline: 988   The patient verbalized understanding of instructions, educational materials, and care plan provided today and DECLINED offer to receive copy of patient instructions, educational materials, and care plan.   The patient has been provided with contact information for the care management team and has been advised to call with any health related questions or concerns.   Bary Leriche, RN, MSN [facility] Care Management Care Management  Coordinator Direct Line [phone]

## 2022-11-17 LAB — CULTURE, BODY FLUID W GRAM STAIN -BOTTLE: Culture: NO GROWTH

## 2022-11-18 MED FILL — Dexamethasone Sodium Phosphate Inj 100 MG/10ML: INTRAMUSCULAR | Qty: 1 | Status: AC

## 2022-11-19 ENCOUNTER — Encounter: Payer: Self-pay | Admitting: *Deleted

## 2022-11-19 ENCOUNTER — Inpatient Hospital Stay: Payer: Medicaid Other | Attending: Hematology and Oncology | Admitting: Hematology and Oncology

## 2022-11-19 ENCOUNTER — Inpatient Hospital Stay: Payer: Medicaid Other

## 2022-11-19 ENCOUNTER — Other Ambulatory Visit: Payer: Self-pay

## 2022-11-19 ENCOUNTER — Telehealth: Payer: Self-pay | Admitting: Radiation Oncology

## 2022-11-19 VITALS — BP 113/72 | HR 94 | Temp 97.7°F | Resp 18 | Ht 66.0 in | Wt 196.1 lb

## 2022-11-19 DIAGNOSIS — Z9221 Personal history of antineoplastic chemotherapy: Secondary | ICD-10-CM | POA: Diagnosis not present

## 2022-11-19 DIAGNOSIS — R944 Abnormal results of kidney function studies: Secondary | ICD-10-CM | POA: Insufficient documentation

## 2022-11-19 DIAGNOSIS — Z170 Estrogen receptor positive status [ER+]: Secondary | ICD-10-CM | POA: Diagnosis not present

## 2022-11-19 DIAGNOSIS — Z171 Estrogen receptor negative status [ER-]: Secondary | ICD-10-CM | POA: Insufficient documentation

## 2022-11-19 DIAGNOSIS — Z9013 Acquired absence of bilateral breasts and nipples: Secondary | ICD-10-CM | POA: Diagnosis not present

## 2022-11-19 DIAGNOSIS — C50411 Malignant neoplasm of upper-outer quadrant of right female breast: Secondary | ICD-10-CM

## 2022-11-19 DIAGNOSIS — C50812 Malignant neoplasm of overlapping sites of left female breast: Secondary | ICD-10-CM | POA: Diagnosis present

## 2022-11-19 DIAGNOSIS — Z923 Personal history of irradiation: Secondary | ICD-10-CM | POA: Insufficient documentation

## 2022-11-19 LAB — CBC WITH DIFFERENTIAL (CANCER CENTER ONLY)
Abs Immature Granulocytes: 0.18 10*3/uL — ABNORMAL HIGH (ref 0.00–0.07)
Basophils Absolute: 0.1 10*3/uL (ref 0.0–0.1)
Basophils Relative: 1 %
Eosinophils Absolute: 0.1 10*3/uL (ref 0.0–0.5)
Eosinophils Relative: 1 %
HCT: 34.7 % — ABNORMAL LOW (ref 36.0–46.0)
Hemoglobin: 10.7 g/dL — ABNORMAL LOW (ref 12.0–15.0)
Immature Granulocytes: 2 %
Lymphocytes Relative: 11 %
Lymphs Abs: 0.9 10*3/uL (ref 0.7–4.0)
MCH: 29.1 pg (ref 26.0–34.0)
MCHC: 30.8 g/dL (ref 30.0–36.0)
MCV: 94.3 fL (ref 80.0–100.0)
Monocytes Absolute: 0.6 10*3/uL (ref 0.1–1.0)
Monocytes Relative: 7 %
Neutro Abs: 6.3 10*3/uL (ref 1.7–7.7)
Neutrophils Relative %: 78 %
Platelet Count: 210 10*3/uL (ref 150–400)
RBC: 3.68 MIL/uL — ABNORMAL LOW (ref 3.87–5.11)
RDW: 21.7 % — ABNORMAL HIGH (ref 11.5–15.5)
WBC Count: 8 10*3/uL (ref 4.0–10.5)
nRBC: 0.2 % (ref 0.0–0.2)

## 2022-11-19 LAB — CMP (CANCER CENTER ONLY)
ALT: 16 U/L (ref 0–44)
AST: 16 U/L (ref 15–41)
Albumin: 3.7 g/dL (ref 3.5–5.0)
Alkaline Phosphatase: 75 U/L (ref 38–126)
Anion gap: 10 (ref 5–15)
BUN: 25 mg/dL — ABNORMAL HIGH (ref 8–23)
CO2: 26 mmol/L (ref 22–32)
Calcium: 9.5 mg/dL (ref 8.9–10.3)
Chloride: 101 mmol/L (ref 98–111)
Creatinine: 1.19 mg/dL — ABNORMAL HIGH (ref 0.44–1.00)
GFR, Estimated: 51 mL/min — ABNORMAL LOW (ref >60)
Glucose, Bld: 173 mg/dL — ABNORMAL HIGH (ref 70–99)
Potassium: 4.5 mmol/L (ref 3.5–5.1)
Sodium: 137 mmol/L (ref 135–145)
Total Bilirubin: 0.3 mg/dL (ref 0.3–1.2)
Total Protein: 6.9 g/dL (ref 6.5–8.1)

## 2022-11-19 MED ORDER — APIXABAN 5 MG PO TABS: 5.0000 mg | ORAL_TABLET | Freq: Two times a day (BID) | ORAL | 5 refills | Status: DC

## 2022-11-19 NOTE — Assessment & Plan Note (Addendum)
Bilateral breast cancers Screening mammogram detected bilateral breast abnormalities. 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%, PR 0%, HER2 negative, Ki-67 less than 1%), 1.4 cm mass (not biopsied) axilla negative Right breast: Calcifications 3 cm biopsy: Grade 2 IDC with DCIS ER 0%, PR 0%, HER2 negative, Ki-67 0%, axilla negative, additional distortion to be biopsied today   Treatment plan: 07/19/2022: Bilateral mastectomies:  Left mastectomy: Grade 2 IDC 6.8 cm with intermediate grade DCIS, margins negative, ER 100%, PR 5%, HER2 negative (2+ IHC, FISH neg), Ki-67 less than 1% 0/5 lymph nodes;  Right mastectomy: Grade 2 IDC 8.5 cm and 1.5 cm with high-grade DCIS, margins negative, ER 95% (on biopsy it was read as negative), PR 0%, HER2 negative, Ki-67 15% 0/3 lymph nodes Adjuvant chemotherapy CMF x 6 cycles (selected because of her performance status) started 08/20/2022 Postmastectomy radiation Antiestrogen therapy   Patient needs an extremely unhealthy diet rich in cholesterol and fats and sugar.  She does not seem to have any interest in changing her diet. ------------------------------------------------------------------------------------------------------------------------ Current treatment: Completed 4 cycles of CMF (discontinuing chemo) Chemo toxicities: Hospitalization 11/06/2022-11/13/2022: Febrile neutropenia with sepsis and pneumonia (breast seroma status post drain placement)    elevated serum creatinine:  We discussed that the risks and benefits of proceeding with chemotherapy do not favor continuation of treatment and therefore recommended discontinuation of treatment.  I will request for radiation oncology consultation and request Dr. Carolynne Edouard to remove the port. Patient states that she lost about 20 pounds during the  hospital stay and that she is making major lifestyle changes to eat healthy and lose weight.  I will see her after radiation is complete.

## 2022-11-19 NOTE — Progress Notes (Signed)
Patient Care Team: Fatima Sanger, FNP as PCP - General (Internal Medicine) Donnelly Angelica, RN as Oncology Nurse Navigator Pershing Proud, RN as Oncology Nurse Navigator Serena Croissant, MD as Consulting Physician (Hematology and Oncology) Griselda Miner, MD as Consulting Physician (General Surgery) Lonie Peak, MD as Attending Physician (Radiation Oncology) Fleeta Emmer, RN as [facility] Care Management  DIAGNOSIS:  Encounter Diagnoses  Name Primary?   Malignant neoplasm of upper-outer quadrant of right breast in female, estrogen receptor negative (HCC) Yes   Malignant neoplasm of overlapping sites of left breast in female, estrogen receptor positive (HCC)     SUMMARY OF ONCOLOGIC HISTORY: Oncology History  Malignant neoplasm of upper-outer quadrant of right breast in female, estrogen receptor negative (HCC)  05/14/2022 Initial Diagnosis   Screening mammogram detected bilateral breast abnormalities. 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%, PR 0%, HER2 negative, Ki-67 less than 1%), 1.4 cm mass (not biopsied) axilla negative Right breast: Calcifications 3 cm biopsy: Grade 2 IDC with DCIS ER 0%, PR 0%, HER2 negative, Ki-67 0%, axilla negative, additional distortion to be biopsied today   06/02/2022 Genetic Testing   [facility] CancerNext-Expanded Panel+RNA was Negative. Report date is 06/03/2022.  The CancerNext-Expanded gene panel offered by [facility] and includes sequencing, rearrangement, and RNA analysis for the following 77 genes: AIP, ALK, APC, ATM, AXIN2, BAP1, BARD1, BLM, BMPR1A, BRCA1, BRCA2, BRIP1, CDC73, CDH1, CDK4, CDKN1B, CDKN2A, CHEK2, CTNNA1, DICER1, FANCC, FH, FLCN, GALNT12, KIF1B, LZTR1, MAX, MEN1, MET, MLH1, MSH2, MSH3, MSH6, MUTYH, NBN, NF1, NF2, NTHL1, PALB2, PHOX2B, PMS2,  POT1, PRKAR1A, PTCH1, PTEN, RAD51C, RAD51D, RB1, RECQL, RET, SDHA, SDHAF2, SDHB, SDHC, SDHD, SMAD4, SMARCA4, SMARCB1, SMARCE1, STK11, SUFU, TMEM127, TP53, TSC1, TSC2, VHL and XRCC2 (sequencing and deletion/duplication); EGFR, EGLN1, HOXB13, KIT, MITF, PDGFRA, POLD1, and POLE (sequencing only); EPCAM and GREM1 (deletion/duplication only).     07/19/2022 Surgery   Bilateral mastectomies:  Left mastectomy: Grade 2 IDC 6.8 cm with intermediate grade DCIS, margins negative, ER 100%, PR 5%, HER2 negative (2+ IHC, FISH neg), Ki-67 less than 1% 0/5 lymph nodes;  Right mastectomy: Grade 2 IDC 8.5 cm and 1.5 cm with high-grade DCIS, margins negative, ER 95% (on biopsy it was read as negative), PR 0%, HER2 negative, Ki-67 15% 0/3 lymph nodes   08/20/2022 - 10/22/2022 Chemotherapy   Patient is on Treatment Plan : BREAST Adjuvant CMF IV q21d     Malignant neoplasm of overlapping sites of left breast in female, estrogen receptor positive (HCC)  05/24/2022 Initial Diagnosis   Malignant neoplasm of overlapping sites of left breast in female, estrogen receptor positive (HCC)   05/26/2022 Cancer Staging   Staging form: Breast, AJCC 8th Edition - Clinical: Stage IB (cT2, cN0, cM0, G2, ER+, PR+, HER2-) - Signed by Serena Croissant, MD on 05/26/2022 Histologic grading system: 3 grade system   06/02/2022 Genetic Testing   [facility] CancerNext-Expanded Panel+RNA was Negative. Report date is 06/03/2022.  The CancerNext-Expanded gene panel offered by [facility] and includes sequencing, rearrangement, and RNA analysis for the following 77 genes: AIP, ALK, APC, ATM, AXIN2, BAP1, BARD1, BLM, BMPR1A, BRCA1, BRCA2, BRIP1, CDC73, CDH1, CDK4, CDKN1B, CDKN2A, CHEK2, CTNNA1, DICER1, FANCC, FH, FLCN, GALNT12, KIF1B, LZTR1, MAX, MEN1, MET, MLH1, MSH2, MSH3, MSH6, MUTYH, NBN, NF1, NF2, NTHL1, PALB2, PHOX2B, PMS2, POT1, PRKAR1A, PTCH1, PTEN, RAD51C,  RAD51D, RB1, RECQL, RET, SDHA, SDHAF2, SDHB, SDHC, SDHD, SMAD4, SMARCA4, SMARCB1, SMARCE1,  STK11, SUFU, TMEM127, TP53, TSC1, TSC2, VHL and XRCC2 (sequencing and deletion/duplication); EGFR, EGLN1, HOXB13, KIT, MITF, PDGFRA, POLD1, and POLE (sequencing only); EPCAM and GREM1 (deletion/duplication only).     08/20/2022 - 10/22/2022 Chemotherapy   Patient is on Treatment Plan : BREAST Adjuvant CMF IV q21d       CHIEF COMPLIANT: Cycle 5 CMF Post hospitalization  INTERVAL HISTORY: RUCHA HEREFORD is a 62 y.o. female is here because of recent diagnosis of bilateral breast cancers who finished 4 cycles of CMF adjuvant chemotherapy.  She was hospitalized for neutropenic fever and severe sepsis and was completely obtunded.  She was able to make a full recovery and able to be discharged home to be followed up with [place] today. She presents to the clinic for a follow-up. She reports when she was in the hospital she lost at least 20 pounds.   ALLERGIES:  is allergic to codeine phosphate, lipitor [atorvastatin], simvastatin, sulfamethoxazole, and tape.  MEDICATIONS:  Current Outpatient Medications  Medication Sig Dispense Refill   albuterol (VENTOLIN HFA) 108 (90 Base) MCG/ACT inhaler Inhale 2 puffs into the lungs every 6 (six) hours as needed for wheezing or shortness of breath. 6.7 g 0   ALPRAZolam (XANAX) 0.5 MG tablet Take 0.5 mg by mouth 2 (two) times daily as needed for sleep or anxiety.  2   LIQUID ALLERGY RELIEF 12.5 MG/5ML liquid Take by mouth.     magic mouthwash w/lidocaine SOLN Take 5 mLs by mouth 4 (four) times daily as needed for mouth pain. 240 mL 1   metFORMIN (GLUCOPHAGE-XR) 500 MG 24 hr tablet Take 1,000 mg by mouth 2 (two) times daily.     MOUNJARO 5 MG/0.5ML Pen Inject 5 mg into the skin every Monday.     oxyCODONE ER (XTAMPZA ER) 36 MG C12A Take 36 mg by mouth in the morning and at bedtime.     pantoprazole (PROTONIX) 40 MG tablet Take 40 mg by mouth daily.     Pediatric Multivitamins-Iron (FLINTSTONES PLUS EXTRA IRON PO) Take 1 tablet by mouth daily.     pregabalin (LYRICA)  100 MG capsule Take 100 mg by mouth 2 (two) times daily.     amitriptyline (ELAVIL) 50 MG tablet Take 50 mg by mouth at bedtime.     amLODipine (NORVASC) 10 MG tablet Take 10 mg by mouth daily.     apixaban (ELIQUIS) 5 MG TABS tablet Take 1 tablet (5 mg total) by mouth 2 (two) times daily. 60 tablet 5   ezetimibe (ZETIA) 10 MG tablet Take 10 mg by mouth daily.     loratadine (CLARITIN) 10 MG tablet Take 10 mg by mouth daily.     promethazine (PHENERGAN) 25 MG tablet TAKE 1 TABLET BY MOUTH EVERY 6 HOURS AS NEEDED FOR NAUSEA OR VOMITING. 30 tablet 0   No current facility-administered medications for this visit.    PHYSICAL EXAMINATION: ECOG PERFORMANCE STATUS: 1 - Symptomatic but completely ambulatory  Vitals:   11/19/22 0834  BP: 113/72  Pulse: 94  Resp: 18  Temp: 97.7 F (36.5 C)  SpO2: 95%   Filed Weights   11/19/22 0834  Weight: 196 lb 2 oz (89 kg)     LABORATORY DATA:  I have reviewed the data as listed    Latest Ref Rng & Units 11/19/2022    7:56 AM 11/13/2022    3:40 AM 11/12/2022    2:18 AM  CMP  Glucose 70 - 99 mg/dL 161  096  045   BUN 8 - 23 mg/dL 25  14  13    Creatinine 0.44 - 1.00 mg/dL 4.09  8.11  9.14   Sodium 135 - 145 mmol/L 137  136  135   Potassium 3.5 - 5.1 mmol/L 4.5  4.0  3.3   Chloride 98 - 111 mmol/L 101  101  100   CO2 22 - 32 mmol/L 26  22  24    Calcium 8.9 - 10.3 mg/dL 9.5  8.7  8.9   Total Protein 6.5 - 8.1 g/dL 6.9  6.1  6.1   Total Bilirubin 0.3 - 1.2 mg/dL 0.3  0.7  0.6   Alkaline Phos 38 - 126 U/L 75  76  96   AST 15 - 41 U/L 16  20  18    ALT 0 - 44 U/L 16  13  14      Lab Results  Component Value Date   WBC 8.0 11/19/2022   HGB 10.7 (L) 11/19/2022   HCT 34.7 (L) 11/19/2022   MCV 94.3 11/19/2022   PLT 210 11/19/2022   NEUTROABS 6.3 11/19/2022    ASSESSMENT & PLAN:  Malignant neoplasm of upper-outer quadrant of right breast in female, estrogen receptor negative (HCC) Bilateral breast cancers Screening mammogram detected bilateral  breast abnormalities. 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%, PR 0%, HER2 negative, Ki-67 less than 1%), 1.4 cm mass (not biopsied) axilla negative Right breast: Calcifications 3 cm biopsy: Grade 2 IDC with DCIS ER 0%, PR 0%, HER2 negative, Ki-67 0%, axilla negative, additional distortion to be biopsied today   Treatment plan: 07/19/2022: Bilateral mastectomies:  Left mastectomy: Grade 2 IDC 6.8 cm with intermediate grade DCIS, margins negative, ER 100%, PR 5%, HER2 negative (2+ IHC, FISH neg), Ki-67 less than 1% 0/5 lymph nodes;  Right mastectomy: Grade 2 IDC 8.5 cm and 1.5 cm with high-grade DCIS, margins negative, ER 95% (on biopsy it was read as negative), PR 0%, HER2 negative, Ki-67 15% 0/3 lymph nodes Adjuvant chemotherapy CMF x 6 cycles (selected because of her performance status) started 08/20/2022 Postmastectomy radiation Antiestrogen therapy   Patient needs an extremely unhealthy diet rich in cholesterol and fats and sugar.  She does not seem to have any interest in changing her diet. ------------------------------------------------------------------------------------------------------------------------ Current treatment: Completed 4 cycles of CMF (discontinuing chemo) Chemo toxicities: Hospitalization 11/06/2022-11/13/2022: Febrile neutropenia with sepsis and pneumonia (breast seroma status post drain placement)    elevated serum creatinine:  We discussed that the risks and benefits of proceeding with chemotherapy do not favor continuation of treatment and therefore recommended discontinuation of treatment.  I will request for radiation oncology consultation and request Dr. Carolynne Edouard to remove the port. Patient states that she lost about 20 pounds during the hospital stay and that she is making major lifestyle changes to eat  healthy and lose weight.  I will see her after radiation is complete.   No orders of the defined types were placed in this encounter.  The patient has a good understanding of the overall plan. she agrees with it. she will call with any problems that may develop before the next visit here. Total time spent: 30 mins including face to face time and time spent for planning, charting and co-ordination of care  Tamsen Meek, MD 11/19/22    I Janan Ridge am acting as a Neurosurgeon for [employer]  I have reviewed the above documentation for accuracy and completeness, and I agree with the above.

## 2022-11-19 NOTE — Telephone Encounter (Signed)
6/7 @ 2:24 pm Left voicemail for patient to call our office to be schedule for consult with ct sim.

## 2022-11-20 ENCOUNTER — Encounter: Payer: Self-pay | Admitting: Hematology and Oncology

## 2022-11-21 ENCOUNTER — Encounter: Payer: Self-pay | Admitting: Hematology and Oncology

## 2022-11-22 NOTE — Progress Notes (Signed)
Location of Breast Cancer: Malignant neoplasm of overlapping sites of left breast in female, estrogen receptor positive   Histology per Pathology Report:  07-19-22 FINAL MICROSCOPIC DIAGNOSIS:  A. BREAST, LEFT, MASTECTOMY: Invasive ductal carcinoma, 6.8 cm, grade 2 Ductal carcinoma in situ: Present, solid intermediate nuclear grade Margins, invasive: Negative     Closest, invasive: Posterior, 21 mm Margins, DCIS: Negative     Closest, DCIS: Posterior, 21 mm Lymphovascular invasion: Not identified Prognostic markers:  ER positive, PR negative, Her2 negative, Ki-67 less than 1% Other: Sclerosing adenosis See oncology table and comment  B. BREAST, RIGHT, MASTECTOMY: Invasive ductal carcinoma, 2 tumors 8.5 cm and 1.5 cm Ductal carcinoma in situ: Present, high-grade with necrosis Margins, invasive: Negative     Closest, invasive: Posterior, 28 mm Margins, DCIS: Negative     Closest, DCIS: Posterior, 28 mm Lymphovascular invasion: Not identified Prognostic markers:  ER 0%, negative, PR 0%, negative, Her2 negative Ki-67 0% Other: Sclerosing adenosis and complex sclerosing lesion See oncology table and comment  C. SENTINEL LYMPH NODE, LEFT AXILLARY #1, BIOPSY: One lymph node negative for metastatic carcinoma (0/1)  D. SENTINEL LYMPH NODE, RIGHT AXILLARY, BIOPSY: One lymph node negative for metastatic carcinoma (0/1)  E. SENTINEL LYMPH NODE, RIGHT AXILLARY, BIOPSY: One lymph node negative for metastatic carcinoma (0/1)  F. SENTINEL LYMPH NODE, RIGHT AXILLARY, BIOPSY: One lymph node negative for metastatic carcinoma (0/1)  G. SENTINEL LYMPH NODE, LEFT AXILLARY #2, BIOPSY: One lymph node negative for metastatic carcinoma (0/1)  H. SENTINEL LYMPH NODE, LEFT AXILLARY, BIOPSY: One lymph node negative for metastatic carcinoma (0/1)  I. SENTINEL LYMPH NODE, LEFT AXILLARY #3, BIOPSY: One lymph node negative for metastatic carcinoma (0/1)  J. SENTINEL LYMPH NODE, LEFT AXILLARY,  BIOPSY: One lymph node negative for metastatic carcinoma (0/1)  A.  LEFT BREAST ONCOLOGY TABLE: INVASIVE CARCINOMA OF THE BREAST:  Resection Procedure: Left mastectomy and five sentinel lymph nodes Specimen Laterality: Left breast Histologic Type: Ductal Histologic Grade:      Glandular (Acinar)/Tubular Differentiation: 3      Nuclear Pleomorphism: 2      Mitotic Rate: 1      Overall Grade: 2 Tumor Size: 6.8 x 2.3 x 1.9 cm Ductal Carcinoma In Situ: Present, solid, intermediate nuclear grade Lymphatic and/or Vascular Invasion: Not identified Treatment Effect in the Breast: No known presurgical therapy Margins: All margins negative for invasive carcinoma      Distance from Closest Margin (mm): 21 mm      Specify Closest Margin (required only if <71mm): Posterior DCIS Margins: All margins negative for DCIS      Distance from Closest Margin (mm): 21 mm      Specify Closest Margin (required only if <9mm): Posterior Regional Lymph Nodes:      Number of Lymph Nodes Examined: 5      Number of Sentinel Nodes Examined: 5      Number of Lymph Nodes with Macrometastases (>2 mm): 0      Number of Lymph Nodes with Micrometastases: 0      Number of Lymph Nodes with Isolated Tumor Cells (=0.2 mm or =200 cells): 0      Size of Largest Metastatic Deposit (mm): Not applicable      Extranodal Extension: Not applicable Distant Metastasis:      Distant Site(s) Involved: Not applicable Breast Biomarker Testing Performed on Previous Biopsy:      Testing Performed on Case Number: [id number]  Estrogen Receptor: 70% and 80%, positive, strong staining            Progesterone Receptor: 0% and 0%, negative            HER2: Negative and negative (0)            Ki-67: Less than 1% Pathologic Stage Classification (pTNM, AJCC 8th Edition): pT3, pN0 Representative Tumor Block: A2-A7 Comment(s): The tumor involves an area 6.8 cm in greatest dimension and shows extensive solid type intermediate  nuclear grade DCIS with scattered foci of grade 2 invasive ductal carcinoma. (v4.5.0.0)  B.  RIGHT BREAST ONCOLOGY TABLE: INVASIVE CARCINOMA OF THE BREAST:  Resection Procedure: Right mastectomy and three sentinel lymph nodeS Specimen Laterality: Right breast Histologic Type: Ductal Histologic Grade:      Glandular (Acinar)/Tubular Differentiation: 3      Nuclear Pleomorphism: 3      Mitotic Rate: 2      Overall Grade: 3 Tumor Size: 8.5 x 3.7 x 2.9 cm and 1.5 cm (glass slide measurement) see comment Ductal Carcinoma In Situ: Present, high-grade with necrosis Lymphatic and/or Vascular Invasion: Not identified Treatment Effect in the Breast: No known presurgical therapy Margins: All margins negative for invasive carcinoma      Distance from Closest Margin (mm): 28 mm      Specify Closest Margin (required only if <38mm): Posterior DCIS Margins: All margins negative for DCIS      Distance from Closest Margin (mm): 28 mm      Specify Closest Margin (required only if <60mm): Posterior Regional Lymph Nodes:      Number of Lymph Nodes Examined: 3      Number of Sentinel Nodes Examined: 3      Number of Lymph Nodes with Macrometastases (>2 mm): 0      Number of Lymph Nodes with Micrometastases: 0      Number of Lymph Nodes with Isolated Tumor Cells (=0.2 mm or =200 cells): 0      Size of Largest Metastatic Deposit (mm): Not      Extranodal Extension: Not applicable Distant Metastasis:      Distant Site(s) Involved: Not applicable Breast Biomarker Testing Performed on Previous Biopsy:      Testing Performed on Case Number: [id number]            Estrogen Receptor: 0%, negative            Progesterone Receptor: 0%, negative            HER2: Negative (1+)            Ki-67: 0% Pathologic Stage Classification (pTNM, AJCC 8th Edition): pT3, pN0 Representative Tumor Block: B2-B6 Comment(s): The larger tumor involves an area which is 8.5 cm and shows extensive high-grade DCIS with necrosis  with scattered foci of grade 3 invasive ductal carcinoma.  There is a 1.5 cm (glass slide measurement) grade 2 invasive ductal carcinoma with extensive high-grade DCIS located at the 3 o'clock position and 6.8 cm from the nipple which is separate from the larger 8.5 cm tumor. (v4.5.0.0)   Receptor Status:  07-19-22 Estrogen Receptor: 70% and 80%, positive, strong staining            Progesterone Receptor: 0% and 0%, negative            HER2: Negative and negative (0)            Ki-67: Less than 1%   Did patient present with symptoms (if so, please note symptoms)  or was this found on screening mammography?: screening mammogram  Past/Anticipated interventions by surgeon, if any: 07-19-22 RE-OPERATIVE DIAGNOSIS:  BILATERAL BREAST CANCER   POST-OPERATIVE DIAGNOSIS:  BILATERAL BREAST CANCER   PROCEDURE:  Procedure(s): BILATERAL TOTAL MASTECTOMY (Bilateral) BILATERAL SENTINEL NODE BIOPSY (Bilateral)   SURGEON:  Surgeon(s) and Role:    Griselda Miner, MD - Primary  Past/Anticipated interventions by medical oncology, if any:  Dr. Pamelia Hoit on 10-22-22 Oncology History  Malignant neoplasm of upper-outer quadrant of right breast in female, estrogen receptor negative (HCC)  05/14/2022 Initial Diagnosis    Screening mammogram detected bilateral breast abnormalities. 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%, PR 0%, HER2 negative, Ki-67 less than 1%), 1.4 cm mass (not biopsied) axilla negative Right breast: Calcifications 3 cm biopsy: Grade 2 IDC with DCIS ER 0%, PR 0%, HER2 negative, Ki-67 0%, axilla negative, additional distortion to be biopsied today    06/02/2022 Genetic Testing    [facility] CancerNext-Expanded Panel+RNA was Negative. Report date is 06/03/2022.   The CancerNext-Expanded gene panel offered by [facility] and  includes sequencing, rearrangement, and RNA analysis for the following 77 genes: AIP, ALK, APC, ATM, AXIN2, BAP1, BARD1, BLM, BMPR1A, BRCA1, BRCA2, BRIP1, CDC73, CDH1, CDK4, CDKN1B, CDKN2A, CHEK2, CTNNA1, DICER1, FANCC, FH, FLCN, GALNT12, KIF1B, LZTR1, MAX, MEN1, MET, MLH1, MSH2, MSH3, MSH6, MUTYH, NBN, NF1, NF2, NTHL1, PALB2, PHOX2B, PMS2, POT1, PRKAR1A, PTCH1, PTEN, RAD51C, RAD51D, RB1, RECQL, RET, SDHA, SDHAF2, SDHB, SDHC, SDHD, SMAD4, SMARCA4, SMARCB1, SMARCE1, STK11, SUFU, TMEM127, TP53, TSC1, TSC2, VHL and XRCC2 (sequencing and deletion/duplication); EGFR, EGLN1, HOXB13, KIT, MITF, PDGFRA, POLD1, and POLE (sequencing only); EPCAM and GREM1 (deletion/duplication only).       07/19/2022 Surgery    Bilateral mastectomies:  Left mastectomy: Grade 2 IDC 6.8 cm with intermediate grade DCIS, margins negative, ER 100%, PR 5%, HER2 negative (2+ IHC, FISH neg), Ki-67 less than 1% 0/5 lymph nodes;  Right mastectomy: Grade 2 IDC 8.5 cm and 1.5 cm with high-grade DCIS, margins negative, ER 95% (on biopsy it was read as negative), PR 0%, HER2 negative, Ki-67 15% 0/3 lymph nodes    08/20/2022 -  Chemotherapy    Patient is on Treatment Plan : BREAST Adjuvant CMF IV q21d     Malignant neoplasm of overlapping sites of left breast in female, estrogen receptor positive (HCC)  05/24/2022 Initial Diagnosis    Malignant neoplasm of overlapping sites of left breast in female, estrogen receptor positive (HCC)    05/26/2022 Cancer Staging    Staging form: Breast, AJCC 8th Edition - Clinical: Stage IB (cT2, cN0, cM0, G2, ER+, PR+, HER2-) - Signed by Serena Croissant, MD on 05/26/2022 Histologic grading system: 3 grade system    06/02/2022 Genetic Testing    [facility] CancerNext-Expanded Panel+RNA was Negative. Report date is 06/03/2022.   The CancerNext-Expanded gene panel offered by [facility] and includes sequencing, rearrangement, and RNA analysis for the following 77 genes: AIP, ALK, APC, ATM, AXIN2, BAP1, BARD1,  BLM, BMPR1A, BRCA1, BRCA2, BRIP1, CDC73, CDH1, CDK4, CDKN1B, CDKN2A, CHEK2, CTNNA1, DICER1, FANCC, FH, FLCN, GALNT12, KIF1B, LZTR1, MAX, MEN1, MET, MLH1, MSH2, MSH3, MSH6, MUTYH, NBN, NF1, NF2, NTHL1, PALB2, PHOX2B, PMS2, POT1, PRKAR1A, PTCH1, PTEN, RAD51C, RAD51D, RB1, RECQL, RET, SDHA, SDHAF2, SDHB, SDHC, SDHD, SMAD4, SMARCA4, SMARCB1, SMARCE1, STK11, SUFU, TMEM127, TP53, TSC1, TSC2, VHL and XRCC2 (sequencing  and deletion/duplication); EGFR, EGLN1, HOXB13, KIT, MITF, PDGFRA, POLD1, and POLE (sequencing only); EPCAM and GREM1 (deletion/duplication only).       08/20/2022 -  Chemotherapy    Patient is on Treatment Plan : BREAST Adjuvant CMF IV q21d     ASSESSMENT & PLAN:  Malignant neoplasm of upper-outer quadrant of right breast in female, estrogen receptor negative (HCC) Bilateral breast cancers Screening mammogram detected bilateral breast abnormalities. 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%, PR 0%, HER2 negative, Ki-67 less than 1%), 1.4 cm mass (not biopsied) axilla negative Right breast: Calcifications 3 cm biopsy: Grade 2 IDC with DCIS ER 0%, PR 0%, HER2 negative, Ki-67 0%, axilla negative, additional distortion to be biopsied today   Treatment plan: 07/19/2022: Bilateral mastectomies:  Left mastectomy: Grade 2 IDC 6.8 cm with intermediate grade DCIS, margins negative, ER 100%, PR 5%, HER2 negative (2+ IHC, FISH neg), Ki-67 less than 1% 0/5 lymph nodes;  Right mastectomy: Grade 2 IDC 8.5 cm and 1.5 cm with high-grade DCIS, margins negative, ER 95% (on biopsy it was read as negative), PR 0%, HER2 negative, Ki-67 15% 0/3 lymph nodes Adjuvant chemotherapy CMF x 6 cycles (selected because of her performance status) started 08/20/2022 Postmastectomy radiation Antiestrogen therapy   Patient needs an extremely unhealthy diet rich  in cholesterol and fats and sugar.  She does not seem to have any interest in changing her diet. ------------------------------------------------------------------------------------------------------------------------ Current treatment: Cycle 4 CMF Chemo toxicities: Mouth sore: Magic mouthwash has helped her Acid reflux: Instructed to go back on taking antiacid medication. Fatigue Nausea: Manageable Urinary tract infection: Treated with ciprofloxacin Chemo-induced anemia: Today's hemoglobin is 10.1.  Monitor   With elevated serum creatinine: We will remove the IV fluids because she did not like it.   Monitoring-clearance and adjusting her methotrexate dose. Return to clinic in 3 weeks for cycle 5   Lymphedema issues, if any:  {:18581} {t:21944}   Pain issues, if any:  {:18581} {PAIN DESCRIPTION:21022940}  SAFETY ISSUES: Prior radiation? {:18581} Pacemaker/ICD? {:18581} Possible current pregnancy?no Is the patient on methotrexate? no  Current Complaints / other details:  pt had port placed on 08-12-22,   11-13-22 Hospital D/C note (was in hospital due to sepsis)  Breast seroma -Pt complained of increased swelling and concerned about fluid collection. Examined with female RN in room present -overlying area does not appear erythematous.  -Chest [place] confirms 9.9cm R sided fluid collection -IR was consulted and pt is now s/p drain placement -Pt to follow up with IR and Dr. Carolynne Edouard on d/c  IR note on 11-24-22 (+) Right chest wall drain to suction bulb with ~20 cc clear, very light yellow output in bulb. Dressing and retention suture in tact. No erythema, edema, bleeding or drainage from the site.

## 2022-11-24 ENCOUNTER — Other Ambulatory Visit (HOSPITAL_COMMUNITY): Payer: Self-pay

## 2022-11-24 ENCOUNTER — Other Ambulatory Visit: Payer: Self-pay | Admitting: Physician Assistant

## 2022-11-24 ENCOUNTER — Ambulatory Visit
Admission: RE | Admit: 2022-11-24 | Discharge: 2022-11-24 | Disposition: A | Discharge status: Home / Self Care | Payer: Self-pay | Source: Ambulatory Visit | Attending: Physician Assistant | Admitting: Physician Assistant

## 2022-11-24 ENCOUNTER — Encounter: Payer: Self-pay | Admitting: Hematology and Oncology

## 2022-11-24 DIAGNOSIS — N6489 Other specified disorders of breast: Secondary | ICD-10-CM

## 2022-11-24 MED ORDER — SODIUM CHLORIDE FLUSH 0.9 % IV SOLN
5.0000 mL | Freq: Every day | INTRAVENOUS | 3 refills | Status: DC
  Filled 2022-11-24: qty 300, 30d supply, fill #0

## 2022-11-24 NOTE — Progress Notes (Signed)
Chief Complaint: Patient was seen in consultation today for right chest wall seroma drain  at the request of [name]  Referring Physician(s): Chevis Pretty, MD  Supervising Physician: Mir, Mauri Reading  History of Present Illness: [name] is a 62 y.o. female with past medical history significant for GERD, anemia, HTN, HLD, DM and breast cancer s/p bilateral mastectomy 07/19/22 and post operative right chest wall seroma s/p IR drain placement 11/12/22 (Dr. Miles Costain) who presents today for drain follow up.  [name] denies any complaints related to the drain, she has been feeling well overall. She tells me she was supposed to have 6 cycles of chemotherapy but was only able to tolerate 4 cycles and is no planned to undergo radiation. She noticed that this area swelled quickly when she was undergoing chemotherapy and is wondering if it recurred because of that. She flushes the drain once per day with 5 cc NS and empties it 2-3 times per day. She has had between 20 and 35 cc clear, very light yellow output since placement. She has had no issues flushing the drain and has not noted any leakage or further swelling. She tries to lay on that side as much as possible when she is home, wears a sports bra to help with compression which was recommended to her by her surgeon.   Past Medical History:  Diagnosis Date   Anemia    Arthritis    Asthma    Bladder spasms    Breast cancer (HCC)    Diabetes mellitus without complication (HCC)    GERD (gastroesophageal reflux disease)    Heart murmur    Hyperlipidemia    Hypertension     Past Surgical History:  Procedure Laterality Date   BACK SURGERY  1989, 1990, 2006   BREAST BIOPSY Left 05/14/2022   [place] LT BREAST BX W LOC DEV 1ST LESION IMG BX SPEC US GUIDE 05/14/2022 [facility]   BREAST BIOPSY Right 05/14/2022   MM RT BREAST BX W LOC DEV 1ST LESION IMAGE BX SPEC STEREO GUIDE 05/14/2022 [facility]   BREAST BIOPSY Left  05/14/2022   MM LT BREAST BX W LOC DEV 1ST LESION IMAGE BX SPEC STEREO GUIDE 05/14/2022 [facility]   BREAST BIOPSY Right 05/26/2022   MM RT BREAST BX W LOC DEV 1ST LESION IMAGE BX SPEC STEREO GUIDE 05/26/2022 [facility]   BREAST BIOPSY Right 05/26/2022   MM RT BREAST BX W LOC DEV EA AD LESION IMG BX SPEC STEREO GUIDE 05/26/2022 [facility]   BREAST LUMPECTOMY  06/14/1974   bi-lat , both benign    CHOLECYSTECTOMY  06/14/1996   IR US GUIDE BX ASP/DRAIN  11/12/2022   LEG SURGERY  06/15/2007   work related injury   PARTIAL HYSTERECTOMY     still has ovaries   PORTACATH PLACEMENT Right 08/12/2022   Procedure: INSERTION PORT-A-CATH;  Surgeon: Griselda Miner, MD;  Location: [facility];  Service: General;  Laterality: Right;   SENTINEL NODE BIOPSY Bilateral 07/19/2022   Procedure: BILATERAL SENTINEL NODE BIOPSY;  Surgeon: Griselda Miner, MD;  Location: [facility];  Service: General;  Laterality: Bilateral;   TOTAL MASTECTOMY Bilateral 07/19/2022   Procedure: BILATERAL TOTAL MASTECTOMY;  Surgeon: Griselda Miner, MD;  Location: [facility];  Service: General;  Laterality: Bilateral;    Allergies: Codeine phosphate, Lipitor [atorvastatin], Simvastatin, Sulfamethoxazole, and Tape  Medications: Prior to Admission medications   Medication Sig Start Date End Date Taking? Authorizing Provider  albuterol (  VENTOLIN HFA) 108 (90 Base) MCG/ACT inhaler Inhale 2 puffs into the lungs every 6 (six) hours as needed for wheezing or shortness of breath. 07/16/20   Ghimire, Werner Lean, MD  ALPRAZolam Prudy Feeler) 0.5 MG tablet Take 0.5 mg by mouth 2 (two) times daily as needed for sleep or anxiety. 02/20/15   [provider]  amitriptyline (ELAVIL) 50 MG tablet Take 50 mg by mouth at bedtime.    [provider]  amLODipine (NORVASC) 10 MG tablet Take 10 mg by mouth daily. 05/05/20   [provider]  apixaban (ELIQUIS) 5 MG TABS tablet Take 1 tablet (5 mg total) by mouth 2 (two) times daily.  11/19/22 05/18/23  Serena Croissant, MD  ezetimibe (ZETIA) 10 MG tablet Take 10 mg by mouth daily.    [provider]  LIQUID ALLERGY RELIEF 12.5 MG/5ML liquid Take by mouth. 09/28/22   [provider]  loratadine (CLARITIN) 10 MG tablet Take 10 mg by mouth daily.    [provider]  magic mouthwash w/lidocaine SOLN Take 5 mLs by mouth 4 (four) times daily as needed for mouth pain. 09/16/22   Serena Croissant, MD  metFORMIN (GLUCOPHAGE-XR) 500 MG 24 hr tablet Take 1,000 mg by mouth 2 (two) times daily. 06/09/20   [provider]  MOUNJARO 5 MG/0.5ML Pen Inject 5 mg into the skin every Monday. 11/01/22   [provider]  oxyCODONE ER (XTAMPZA ER) 36 MG C12A Take 36 mg by mouth in the morning and at bedtime.    [provider]  pantoprazole (PROTONIX) 40 MG tablet Take 40 mg by mouth daily.    [provider]  Pediatric Multivitamins-Iron (FLINTSTONES PLUS EXTRA IRON PO) Take 1 tablet by mouth daily.    [provider]  pregabalin (LYRICA) 100 MG capsule Take 100 mg by mouth 2 (two) times daily. 11/14/22   [provider]  promethazine (PHENERGAN) 25 MG tablet TAKE 1 TABLET BY MOUTH EVERY 6 HOURS AS NEEDED FOR NAUSEA OR VOMITING. 10/18/22   Serena Croissant, MD  sodium chloride flush 0.9 % SOLN injection Flush the drain with 5 mL QD. 11/24/22   Villa Herb, PA-C     Family History  Problem Relation Age of Onset   Diabetes Mother    Hypertension Mother    Heart disease Mother    Hypertension Father    Diabetes Father    Heart disease Father    Breast cancer Sister 20   Lymphoma Paternal Grandfather        dx after 91   Neuropathy Neg Hx     Social History   Socioeconomic History   Marital status: Married    Spouse name: [name]    Number of children: 2   Years of education: 9   Highest education level: Not on file  Occupational History   Occupation: Environmental manager  Tobacco Use   Smoking status: Former     Packs/day: 0.50    Years: 37.00    Additional pack years: 0.00    Total pack years: 18.50    Types: Cigarettes    Quit date: 06/14/2013    Years since quitting: 9.4   Smokeless tobacco: Never  Vaping Use   Vaping Use: Never used  Substance and Sexual Activity   Alcohol use: No   Drug use: No   Sexual activity: Never    Comment: 62 YEARS OLD, NO MORE THAN 5 PARTNERS  Other Topics Concern   Not on file  Social History Narrative   Lives at home with husband   Caffeine use: none   Social Determinants of Health   Financial Resource Strain: Not on file  Food Insecurity: No Food Insecurity (06/19/2022)   Hunger Vital Sign    Worried About Running Out of Food in the Last Year: Never true    Ran Out of Food in the Last Year: Never true  Transportation Needs: No Transportation Needs (09/06/2022)   PRAPARE - Administrator, Civil Service (Medical): No    Lack of Transportation (Non-Medical): No  Physical Activity: Not on file  Stress: Not on file  Social Connections: Not on file    Review of Systems: A 12 point ROS discussed and pertinent positives are indicated in the HPI above.  All other systems are negative.  Review of Systems  Constitutional:  Negative for chills and fever.  Respiratory:  Negative for shortness of breath.   Cardiovascular:  Negative for chest pain.  Gastrointestinal:  Negative for abdominal pain, diarrhea, nausea and vomiting.    Vital Signs: BP 116/70 (BP Location: Right Leg, Patient Position: Sitting, Cuff Size: Normal)   Pulse 89   Temp 98.2 F (36.8 C) (Oral)   Wt 200 lb (90.7 kg)   LMP 04/22/1997   SpO2 94%   BMI 32.28 kg/m   Physical Exam Vitals reviewed.  Constitutional:      General: She is not in acute distress. HENT:     Head: Normocephalic.  Pulmonary:     Effort: Pulmonary effort is normal.  Abdominal:     Palpations: Abdomen is soft.  Skin:    General: Skin is warm and dry.     Comments: (+) Right chest wall drain to  suction bulb with ~20 cc clear, very light yellow output in bulb. Dressing and retention suture in tact. No erythema, edema, bleeding or drainage from the site.   Neurological:     Mental Status: She is alert and oriented to person, place, and time.  Psychiatric:        Mood and Affect: Mood normal.        Behavior: Behavior normal.        Thought Content: Thought content normal.        Judgment: Judgment normal.     Imaging: IR US Guide Bx Asp/Drain  Result Date: 11/12/2022 INDICATION: RIGHT MASTECTOMY SITE SUPERFICIAL POSTOPERATIVE SEROMA EXAM: ULTRASOUND RIGHT MASTECTOMY SITE SEROMA DRAIN MEDICATIONS: 1% lidocaine local ANESTHESIA/SEDATION: Moderate (conscious) sedation was employed during this procedure. A total of Versed 1.0 mg and Fentanyl 50 mcg was administered intravenously by the radiology nurse. Total intra-service moderate Sedation Time: 10 minutes. The patient's level of consciousness and vital signs were monitored continuously by radiology nursing throughout the procedure under my direct supervision. COMPLICATIONS: None immediate. PROCEDURE: Informed written consent was obtained from the patient after a thorough discussion of the procedural risks, benefits and alternatives. All questions were addressed. Maximal Sterile Barrier Technique was utilized including caps, mask, sterile gowns, sterile gloves, sterile drape, hand hygiene and skin antiseptic. A timeout was performed prior to the initiation of the procedure. Previous imaging reviewed. Preliminary ultrasound performed. The right breast superficial seroma was marked for access. Under sterile conditions local anesthesia, the 18 gauge introducer needle was advanced percutaneously into the seroma. Needle position confirmed with ultrasound. Images obtained for documentation. Guidewire inserted followed by tract dilatation to insert a 10 [place] drain. Drain catheter position confirmed with ultrasound. Syringe aspiration yielded 120 cc  clear  serosanguineous seroma fluid. Sample sent for culture. This completely collapsed the seroma. Catheter secured with Prolene suture and connected to external suction bulb. Sterile dressing applied. No immediate complication. Patient tolerated the procedure well. IMPRESSION: Successful ultrasound right mastectomy site seroma drain placement. Sample sent for culture. Electronically Signed   By: Judie Petit.  Shick M.D.   On: 11/12/2022 14:36   [place] CHEST SOFT TISSUE  Result Date: 11/11/2022 CLINICAL DATA:  Bilateral total mastectomy 07/19/2022. Breast swelling. EXAM: ULTRASOUND OF CHEST SOFT TISSUES TECHNIQUE: Ultrasound examination of the chest wall soft tissues was performed in the area of clinical concern. COMPARISON:  None Available. FINDINGS: In the left lateral chest wall, there is a small fluid collection measuring up to 1 cm. This is at the incision site. This may reflect a small postoperative seroma. In the right lateral chest wall, there is a complex cystic area measuring 9.9 x 9.0 x 3.5 cm. Internal echoes and septations. This could reflect liquified hematoma or abscess. IMPRESSION: Right lateral chest wall fluid collection measuring up to 9.9 cm with internal echoes and septations, likely liquified hematoma or abscess. Left lateral chest wall fluid collection measuring up to 1 cm, likely postoperative seroma. Electronically Signed   By: Charlett Nose M.D.   On: 11/11/2022 21:15   [place] CHEST SOFT TISSUE  Result Date: 11/11/2022 CLINICAL DATA:  Chest swelling EXAM: ULTRASOUND OF CHEST SOFT TISSUES TECHNIQUE: Ultrasound examination of the chest wall soft tissues was performed in the area of clinical concern. COMPARISON:  None Available. FINDINGS: Targeted sonography of right lateral chest is performed in the region of swelling. Complex fluid collection, mostly anechoic but contains thin internal septations, this measures 9.8 x 3.5 by 9 cm. Mild overlying skin thickening. IMPRESSION: Status post mastectomy. Large  multi-septated fluid collection within the right lateral chest measuring up to 9.8 cm, corresponding to the area of swelling. This is nonspecific in appearance and could represent postoperative collection, sterility is indeterminate by ultrasound. Electronically Signed   By: Jasmine Pang M.D.   On: 11/11/2022 20:00   VAS [place] LOWER EXTREMITY VENOUS (DVT)  Result Date: 11/08/2022  Lower Venous DVT Study Patient Name:  RUDEAN AZBELL  Date of Exam:   11/07/2022 Medical Rec #: [id number]        Accession #:    [id number] Date of Birth: [birth year]        Patient Gender: F Patient Age:   39 years Exam Location:  [facility] Procedure:      VAS [place] LOWER EXTREMITY VENOUS (DVT) Referring Phys: Lyda Perone --------------------------------------------------------------------------------  Indications: Swelling, Pain, and Elevated D-Dimer.  Risk Factors: Breast Cancer, status post bilateral mastectomy. On Chemo. Limitations: Edema. Comparison Study: No prior study on file Performing Technologist: Sherren Kerns RVS  Examination Guidelines: A complete evaluation includes B-mode imaging, spectral Doppler, color Doppler, and power Doppler as needed of all accessible portions of each vessel. Bilateral testing is considered an integral part of a complete examination. Limited examinations for reoccurring indications may be performed as noted. The reflux portion of the exam is performed with the patient in reverse Trendelenburg.  +---------+---------------+---------+-----------+---------------+--------------+ RIGHT    CompressibilityPhasicitySpontaneityProperties     Thrombus Aging +---------+---------------+---------+-----------+---------------+--------------+ CFV      Full                               pulsatile  waveforms                     +---------+---------------+---------+-----------+---------------+--------------+ SFJ      Full                                                              +---------+---------------+---------+-----------+---------------+--------------+ FV Prox  Full                                                             +---------+---------------+---------+-----------+---------------+--------------+ FV Mid   Full                               pulsatile                                                                 waveforms                     +---------+---------------+---------+-----------+---------------+--------------+ FV DistalFull                                                             +---------+---------------+---------+-----------+---------------+--------------+ PFV      Full                                                             +---------+---------------+---------+-----------+---------------+--------------+ POP      Full                               pulsatile                                                                 waveforms                     +---------+---------------+---------+-----------+---------------+--------------+ PTV                                                        Not well  visualized     +---------+---------------+---------+-----------+---------------+--------------+ PERO     None                                              Acute          +---------+---------------+---------+-----------+---------------+--------------+   +---------+---------------+---------+-----------+---------------+--------------+ LEFT     CompressibilityPhasicitySpontaneityProperties     Thrombus Aging +---------+---------------+---------+-----------+---------------+--------------+ CFV      Full                               pulsatile                                                                 waveforms                      +---------+---------------+---------+-----------+---------------+--------------+ SFJ      Full                                                             +---------+---------------+---------+-----------+---------------+--------------+ FV Prox  Full                                                             +---------+---------------+---------+-----------+---------------+--------------+ FV Mid   Full                               pulsatile                                                                 waveforms                     +---------+---------------+---------+-----------+---------------+--------------+ FV DistalFull                                                             +---------+---------------+---------+-----------+---------------+--------------+ PFV      Full                                                             +---------+---------------+---------+-----------+---------------+--------------+ POP  pulsatile      patent by                                                  waveforms      color and                                                                 Doppler        +---------+---------------+---------+-----------+---------------+--------------+ PTV                                                        Not well                                                                  visualized     +---------+---------------+---------+-----------+---------------+--------------+ PERO     None                                              Acute          +---------+---------------+---------+-----------+---------------+--------------+     Summary: RIGHT: - Findings consistent with acute deep vein thrombosis involving the right peroneal veins. - Technically difficult and limited exam in the calf, however, the peroneal veins appear dilated, do not fully compress, and are absent of  color  LEFT: - Findings consistent with acute deep vein thrombosis involving the left peroneal veins. - Technically difficult and limited exam in the calf, however, the peroneal veins appear dilated, do not fully compress, and are absent of color  *See table(s) above for measurements and observations. Electronically signed by Waverly Ferrari MD on 11/08/2022 at 9:29:33 AM.    Final    US RENAL  Result Date: 11/07/2022 CLINICAL DATA:  Acute kidney injury EXAM: RENAL / URINARY TRACT ULTRASOUND COMPLETE COMPARISON:  Abdominal CT 06/19/2022 FINDINGS: Right Kidney: Not sonographically visualized, severely atrophic and likely nonfunctional by prior CT. Left Kidney: volume: 180 mL. Echogenicity within normal limits. No mass or hydronephrosis visualized. Bladder: Collapsed at time of imaging. Other: Echogenic liver from steatosis, confirmed on prior CT IMPRESSION: 1. Severe right renal atrophy with normal appearing left kidney. 2. Hepatic steatosis. Electronically Signed   By: Tiburcio Pea M.D.   On: 11/07/2022 09:17   DG Chest Portable 1 View  Result Date: 11/06/2022 CLINICAL DATA:  Weakness and confusion. EXAM: PORTABLE CHEST 1 VIEW COMPARISON:  August 12, 2022 FINDINGS: Stable right-sided venous Port-A-Cath positioning is seen. The heart size and mediastinal contours are within normal limits. Mild atelectasis is seen within the bilateral lung bases. There is no evidence of focal consolidation, pleural effusion or pneumothorax. Radiopaque surgical  clips are seen overlying the lateral chest wall soft tissues, bilaterally. Multilevel degenerative changes are noted throughout the thoracic spine. IMPRESSION: Mild bibasilar atelectasis. Electronically Signed   By: Aram Candela M.D.   On: 11/06/2022 23:08    Labs:  CBC: Recent Labs    11/11/22 0306 11/12/22 0218 11/13/22 0340 11/19/22 0756  WBC 16.3* 19.4* 22.6* 8.0  HGB 9.3* 9.5* 9.2* 10.7*  HCT 29.9* 31.1* 29.9* 34.7*  PLT 427* 406* 371 210     COAGS: Recent Labs    06/18/22 2139 11/06/22 2018 11/07/22 0202  INR 1.1 1.1 1.2  APTT 27 30  --     BMP: Recent Labs    11/11/22 0306 11/12/22 0218 11/13/22 0340 11/19/22 0756  NA 136 135 136 137  K 3.8 3.3* 4.0 4.5  CL 99 100 101 101  CO2 25 24 22 26   GLUCOSE 116* 122* 114* 173*  BUN 13 13 14  25*  CALCIUM 8.9 8.9 8.7* 9.5  CREATININE 1.35* 1.19* 1.13* 1.19*  GFRNONAA 44* 51* 54* 51*    LIVER FUNCTION TESTS: Recent Labs    11/11/22 0306 11/12/22 0218 11/13/22 0340 11/19/22 0756  BILITOT 0.7 0.6 0.7 0.3  AST 19 18 20 16   ALT 14 14 13 16   ALKPHOS 90 96 76 75  PROT 6.0* 6.1* 6.1* 6.9  ALBUMIN 2.9* 3.0* 3.1* 3.7    TUMOR MARKERS: No results for input(s): "AFPTM", "CEA", "CA199", "CHROMGRNA" in the last 8760 hours.  Assessment:  62 y/o F with history of breast cancer s/p bilateral mastectomy 07/19/22 with post operative right chest wall seroma requiring IR drain placement 11/12/22 (Dr. Miles Costain) seen today for drain follow up.  Patient continues to have 20-35 cc output QD, flushing QD with 5 cc NS - no issues reported with this. She is feeling well overall, no fevers/chills. Continues to follow with general surgery.  Given continued high output it was recommended that the drain remain to suction bulb and to continue to monitor output when the bulb is emptied. She was instructed to continue to flush QD with 5 cc NS (refill for flushes sent to [facility] outpatient pharmacy today) and change the dressing every 2-3 days or earlier if soiled.   She will return to IR clinic in about 10 days for follow up (no imaging required) -- however we discussed that if she continues to have > 20 cc output around the time of her follow up appointment she can call our IR clinic to discuss pushing formal follow up back as she has a lot of doctor's appointments and has a hard time keeping up with all of them.  She was encouraged to call IR clinic with any questions or concerns prior to her  next visit with [place].  Electronically Signed: Villa Herb PA-C 11/24/2022, 2:21 PM   Please refer to Dr. Roosevelt Locks attestation of this note for management and plan.

## 2022-11-25 ENCOUNTER — Ambulatory Visit
Admission: RE | Admit: 2022-11-25 | Discharge: 2022-11-25 | Disposition: A | Discharge status: Home / Self Care | Payer: BC Managed Care – PPO | Source: Ambulatory Visit | Attending: Physician Assistant | Admitting: Physician Assistant

## 2022-11-25 ENCOUNTER — Other Ambulatory Visit: Payer: Self-pay | Admitting: Physician Assistant

## 2022-11-25 DIAGNOSIS — N6489 Other specified disorders of breast: Secondary | ICD-10-CM

## 2022-11-25 NOTE — Progress Notes (Addendum)
Chief Complaint: Patient was seen in consultation today for right chest wall seroma drain malfunction at the request of Watterson,Shannon A  Referring Physician(s): Watterson,Shannon A  History of Present Illness:  [name] is a 62 y.o. female who presented to [facility] clinic yesterday (2024) for evaluation of right chest wall seroma drained.  At that time she denied any complaints related to the drain and was feeling well overall.  Reports she has been flushing drain and empties 2-3 times per day with 20 to 35 cc output daily that is clear, very light yellow since placement.  Patient called clinic this morning and reports that she is unable to flush drain.  Patient presents to clinic today for evaluation.    Patient only complaint today is inability to flush drain.  She reports last flushing drain 11/23/2022.  She states yesterday there was a "worm looking" material hanging out of drain that she pulled but it broke off inside drain.  Past Medical History:  Diagnosis Date   Anemia    Arthritis    Asthma    Bladder spasms    Breast cancer (HCC)    Diabetes mellitus without complication (HCC)    GERD (gastroesophageal reflux disease)    Heart murmur    Hyperlipidemia    Hypertension     Past Surgical History:  Procedure Laterality Date   BACK SURGERY  1989, 1990, 2006   BREAST BIOPSY Left 05/14/2022   [place] LT BREAST BX W LOC DEV 1ST LESION IMG BX SPEC US GUIDE 05/14/2022 [facility]   BREAST BIOPSY Right 05/14/2022   MM RT BREAST BX W LOC DEV 1ST LESION IMAGE BX SPEC STEREO GUIDE 05/14/2022 [facility]   BREAST BIOPSY Left 05/14/2022   MM LT BREAST BX W LOC DEV 1ST LESION IMAGE BX SPEC STEREO GUIDE 05/14/2022 [facility]   BREAST BIOPSY Right 05/26/2022   MM RT BREAST BX W LOC DEV 1ST LESION IMAGE BX SPEC STEREO GUIDE 05/26/2022 [facility]   BREAST BIOPSY Right 05/26/2022   MM RT BREAST BX W LOC DEV EA AD LESION IMG BX SPEC STEREO  GUIDE 05/26/2022 [facility]   BREAST LUMPECTOMY  06/14/1974   bi-lat , both benign    CHOLECYSTECTOMY  06/14/1996   IR US GUIDE BX ASP/DRAIN  11/12/2022   LEG SURGERY  06/15/2007   work related injury   PARTIAL HYSTERECTOMY     still has ovaries   PORTACATH PLACEMENT Right 08/12/2022   Procedure: INSERTION PORT-A-CATH;  Surgeon: Griselda Miner, MD;  Location: [facility];  Service: General;  Laterality: Right;   SENTINEL NODE BIOPSY Bilateral 07/19/2022   Procedure: BILATERAL SENTINEL NODE BIOPSY;  Surgeon: Griselda Miner, MD;  Location: [facility];  Service: General;  Laterality: Bilateral;   TOTAL MASTECTOMY Bilateral 07/19/2022   Procedure: BILATERAL TOTAL MASTECTOMY;  Surgeon: Griselda Miner, MD;  Location: [facility];  Service: General;  Laterality: Bilateral;    Allergies: Codeine phosphate, Lipitor [atorvastatin], Simvastatin, Sulfamethoxazole, and Tape  Medications: Prior to Admission medications   Medication Sig Start Date End Date Taking? Authorizing Provider  albuterol (VENTOLIN HFA) 108 (90 Base) MCG/ACT inhaler Inhale 2 puffs into the lungs every 6 (six) hours as needed for wheezing or shortness of breath. 07/16/20   Ghimire, Werner Lean, MD  ALPRAZolam Prudy Feeler) 0.5 MG tablet Take 0.5 mg by mouth 2 (two) times daily as needed for sleep or anxiety. 02/20/15   [provider]  amitriptyline (ELAVIL) 50 MG  tablet Take 50 mg by mouth at bedtime.    [provider]  amLODipine (NORVASC) 10 MG tablet Take 10 mg by mouth daily. 05/05/20   [provider]  apixaban (ELIQUIS) 5 MG TABS tablet Take 1 tablet (5 mg total) by mouth 2 (two) times daily. 11/19/22 05/18/23  Serena Croissant, MD  ezetimibe (ZETIA) 10 MG tablet Take 10 mg by mouth daily.    [provider]  LIQUID ALLERGY RELIEF 12.5 MG/5ML liquid Take by mouth. 09/28/22   [provider]  loratadine (CLARITIN) 10 MG tablet Take 10 mg by mouth daily.    [provider]  magic mouthwash w/lidocaine  SOLN Take 5 mLs by mouth 4 (four) times daily as needed for mouth pain. 09/16/22   Serena Croissant, MD  metFORMIN (GLUCOPHAGE-XR) 500 MG 24 hr tablet Take 1,000 mg by mouth 2 (two) times daily. 06/09/20   [provider]  MOUNJARO 5 MG/0.5ML Pen Inject 5 mg into the skin every Monday. 11/01/22   [provider]  oxyCODONE ER (XTAMPZA ER) 36 MG C12A Take 36 mg by mouth in the morning and at bedtime.    [provider]  pantoprazole (PROTONIX) 40 MG tablet Take 40 mg by mouth daily.    [provider]  Pediatric Multivitamins-Iron (FLINTSTONES PLUS EXTRA IRON PO) Take 1 tablet by mouth daily.    [provider]  pregabalin (LYRICA) 100 MG capsule Take 100 mg by mouth 2 (two) times daily. 11/14/22   [provider]  promethazine (PHENERGAN) 25 MG tablet TAKE 1 TABLET BY MOUTH EVERY 6 HOURS AS NEEDED FOR NAUSEA OR VOMITING. 10/18/22   Serena Croissant, MD  sodium chloride flush 0.9 % SOLN injection Flush the drain with 5 mL every day 11/24/22   Villa Herb, PA-C     Family History  Problem Relation Age of Onset   Diabetes Mother    Hypertension Mother    Heart disease Mother    Hypertension Father    Diabetes Father    Heart disease Father    Breast cancer Sister 63   Lymphoma Paternal Grandfather        dx after 60   Neuropathy Neg Hx     Social History   Socioeconomic History   Marital status: Married    Spouse name: Ricky    Number of children: 2   Years of education: 9   Highest education level: Not on file  Occupational History   Occupation: Environmental manager  Tobacco Use   Smoking status: Former    Packs/day: 0.50    Years: 37.00    Additional pack years: 0.00    Total pack years: 18.50    Types: Cigarettes    Quit date: 06/14/2013    Years since quitting: 9.4   Smokeless tobacco: Never  Vaping Use   Vaping Use: Never used  Substance and Sexual Activity   Alcohol use: No   Drug use: No   Sexual activity: Never     Comment: 62 YEARS OLD, NO MORE THAN 5 PARTNERS  Other Topics Concern   Not on file  Social History Narrative   Lives at home with husband   Caffeine use: none   Social Determinants of Health   Financial Resource Strain: Not on file  Food Insecurity: No Food Insecurity (06/19/2022)   Hunger Vital Sign    Worried About Running Out of Food in the Last Year: Never true    Ran Out of Food  in the Last Year: Never true  Transportation Needs: No Transportation Needs (09/06/2022)   PRAPARE - Administrator, Civil Service (Medical): No    Lack of Transportation (Non-Medical): No  Physical Activity: Not on file  Stress: Not on file  Social Connections: Not on file    Review of Systems: A 12 point ROS discussed and pertinent positives are indicated in the HPI above.  All other systems are negative.  Review of Systems  All other systems reviewed and are negative.   Vital Signs: LMP 04/22/1997     Physical Exam Constitutional:      General: She is not in acute distress.    Appearance: Normal appearance. She is not ill-appearing.  Pulmonary:     Effort: Pulmonary effort is normal. No respiratory distress.  Skin:    General: Skin is warm and dry.     Comments: R lateral chest wall drain extremely difficult to flush and does not aspirate.  Site unremarkable with sutures/statlock in place. ~10 cc thin, serous OP in JP. Dressing C/D/I.    Neurological:     Mental Status: She is alert and oriented to person, place, and time.  Psychiatric:        Mood and Affect: Mood normal.        Behavior: Behavior normal.        Thought Content: Thought content normal.        Judgment: Judgment normal.        Imaging: IR US Guide Bx Asp/Drain  Result Date: 11/12/2022 INDICATION: RIGHT MASTECTOMY SITE SUPERFICIAL POSTOPERATIVE SEROMA EXAM: ULTRASOUND RIGHT MASTECTOMY SITE SEROMA DRAIN MEDICATIONS: 1% lidocaine local ANESTHESIA/SEDATION: Moderate (conscious) sedation was employed  during this procedure. A total of Versed 1.0 mg and Fentanyl 50 mcg was administered intravenously by the radiology nurse. Total intra-service moderate Sedation Time: 10 minutes. The patient's level of consciousness and vital signs were monitored continuously by radiology nursing throughout the procedure under my direct supervision. COMPLICATIONS: None immediate. PROCEDURE: Informed written consent was obtained from the patient after a thorough discussion of the procedural risks, benefits and alternatives. All questions were addressed. Maximal Sterile Barrier Technique was utilized including caps, mask, sterile gowns, sterile gloves, sterile drape, hand hygiene and skin antiseptic. A timeout was performed prior to the initiation of the procedure. Previous imaging reviewed. Preliminary ultrasound performed. The right breast superficial seroma was marked for access. Under sterile conditions local anesthesia, the 18 gauge introducer needle was advanced percutaneously into the seroma. Needle position confirmed with ultrasound. Images obtained for documentation. Guidewire inserted followed by tract dilatation to insert a 10 [place] drain. Drain catheter position confirmed with ultrasound. Syringe aspiration yielded 120 cc clear serosanguineous seroma fluid. Sample sent for culture. This completely collapsed the seroma. Catheter secured with Prolene suture and connected to external suction bulb. Sterile dressing applied. No immediate complication. Patient tolerated the procedure well. IMPRESSION: Successful ultrasound right mastectomy site seroma drain placement. Sample sent for culture. Electronically Signed   By: Judie Petit.  Shick M.D.   On: 11/12/2022 14:36   [place] CHEST SOFT TISSUE  Result Date: 11/11/2022 CLINICAL DATA:  Bilateral total mastectomy 07/19/2022. Breast swelling. EXAM: ULTRASOUND OF CHEST SOFT TISSUES TECHNIQUE: Ultrasound examination of the chest wall soft tissues was performed in the area of clinical concern.  COMPARISON:  None Available. FINDINGS: In the left lateral chest wall, there is a small fluid collection measuring up to 1 cm. This is at the incision site. This may reflect a small postoperative seroma.  In the right lateral chest wall, there is a complex cystic area measuring 9.9 x 9.0 x 3.5 cm. Internal echoes and septations. This could reflect liquified hematoma or abscess. IMPRESSION: Right lateral chest wall fluid collection measuring up to 9.9 cm with internal echoes and septations, likely liquified hematoma or abscess. Left lateral chest wall fluid collection measuring up to 1 cm, likely postoperative seroma. Electronically Signed   By: Charlett Nose M.D.   On: 11/11/2022 21:15   [place] CHEST SOFT TISSUE  Result Date: 11/11/2022 CLINICAL DATA:  Chest swelling EXAM: ULTRASOUND OF CHEST SOFT TISSUES TECHNIQUE: Ultrasound examination of the chest wall soft tissues was performed in the area of clinical concern. COMPARISON:  None Available. FINDINGS: Targeted sonography of right lateral chest is performed in the region of swelling. Complex fluid collection, mostly anechoic but contains thin internal septations, this measures 9.8 x 3.5 by 9 cm. Mild overlying skin thickening. IMPRESSION: Status post mastectomy. Large multi-septated fluid collection within the right lateral chest measuring up to 9.8 cm, corresponding to the area of swelling. This is nonspecific in appearance and could represent postoperative collection, sterility is indeterminate by ultrasound. Electronically Signed   By: Jasmine Pang M.D.   On: 11/11/2022 20:00   VAS [place] LOWER EXTREMITY VENOUS (DVT)  Result Date: 11/08/2022  Lower Venous DVT Study Patient Name:  [name]  Date of Exam:   11/07/2022 Medical Rec #: [id number]        Accession #:    [id number] Date of Birth: 03/02/1961        Patient Gender: F Patient Age:   47 years Exam Location:  [facility] Procedure:      VAS [place] LOWER EXTREMITY VENOUS (DVT) Referring Phys: Lyda Perone --------------------------------------------------------------------------------  Indications: Swelling, Pain, and Elevated D-Dimer.  Risk Factors: Breast Cancer, status post bilateral mastectomy. On Chemo. Limitations: Edema. Comparison Study: No prior study on file Performing Technologist: Sherren Kerns RVS  Examination Guidelines: A complete evaluation includes B-mode imaging, spectral Doppler, color Doppler, and power Doppler as needed of all accessible portions of each vessel. Bilateral testing is considered an integral part of a complete examination. Limited examinations for reoccurring indications may be performed as noted. The reflux portion of the exam is performed with the patient in reverse Trendelenburg.  +---------+---------------+---------+-----------+---------------+--------------+ RIGHT    CompressibilityPhasicitySpontaneityProperties     Thrombus Aging +---------+---------------+---------+-----------+---------------+--------------+ CFV      Full                               pulsatile                                                                 waveforms                     +---------+---------------+---------+-----------+---------------+--------------+ SFJ      Full                                                             +---------+---------------+---------+-----------+---------------+--------------+  FV Prox  Full                                                             +---------+---------------+---------+-----------+---------------+--------------+ FV Mid   Full                               pulsatile                                                                 waveforms                     +---------+---------------+---------+-----------+---------------+--------------+ FV DistalFull                                                              +---------+---------------+---------+-----------+---------------+--------------+ PFV      Full                                                             +---------+---------------+---------+-----------+---------------+--------------+ POP      Full                               pulsatile                                                                 waveforms                     +---------+---------------+---------+-----------+---------------+--------------+ PTV                                                        Not well                                                                  visualized     +---------+---------------+---------+-----------+---------------+--------------+ PERO     None  Acute          +---------+---------------+---------+-----------+---------------+--------------+   +---------+---------------+---------+-----------+---------------+--------------+ LEFT     CompressibilityPhasicitySpontaneityProperties     Thrombus Aging +---------+---------------+---------+-----------+---------------+--------------+ CFV      Full                               pulsatile                                                                 waveforms                     +---------+---------------+---------+-----------+---------------+--------------+ SFJ      Full                                                             +---------+---------------+---------+-----------+---------------+--------------+ FV Prox  Full                                                             +---------+---------------+---------+-----------+---------------+--------------+ FV Mid   Full                               pulsatile                                                                 waveforms                     +---------+---------------+---------+-----------+---------------+--------------+ FV  DistalFull                                                             +---------+---------------+---------+-----------+---------------+--------------+ PFV      Full                                                             +---------+---------------+---------+-----------+---------------+--------------+ POP                                         pulsatile      patent by  waveforms      color and                                                                 Doppler        +---------+---------------+---------+-----------+---------------+--------------+ PTV                                                        Not well                                                                  visualized     +---------+---------------+---------+-----------+---------------+--------------+ PERO     None                                              Acute          +---------+---------------+---------+-----------+---------------+--------------+     Summary: RIGHT: - Findings consistent with acute deep vein thrombosis involving the right peroneal veins. - Technically difficult and limited exam in the calf, however, the peroneal veins appear dilated, do not fully compress, and are absent of color  LEFT: - Findings consistent with acute deep vein thrombosis involving the left peroneal veins. - Technically difficult and limited exam in the calf, however, the peroneal veins appear dilated, do not fully compress, and are absent of color  *See table(s) above for measurements and observations. Electronically signed by Waverly Ferrari MD on 11/08/2022 at 9:29:33 AM.    Final    US RENAL  Result Date: 11/07/2022 CLINICAL DATA:  Acute kidney injury EXAM: RENAL / URINARY TRACT ULTRASOUND COMPLETE COMPARISON:  Abdominal CT 06/19/2022 FINDINGS: Right Kidney: Not sonographically visualized, severely atrophic and likely nonfunctional by  prior CT. Left Kidney: volume: 180 mL. Echogenicity within normal limits. No mass or hydronephrosis visualized. Bladder: Collapsed at time of imaging. Other: Echogenic liver from steatosis, confirmed on prior CT IMPRESSION: 1. Severe right renal atrophy with normal appearing left kidney. 2. Hepatic steatosis. Electronically Signed   By: Tiburcio Pea M.D.   On: 11/07/2022 09:17   DG Chest Portable 1 View  Result Date: 11/06/2022 CLINICAL DATA:  Weakness and confusion. EXAM: PORTABLE CHEST 1 VIEW COMPARISON:  August 12, 2022 FINDINGS: Stable right-sided venous Port-A-Cath positioning is seen. The heart size and mediastinal contours are within normal limits. Mild atelectasis is seen within the bilateral lung bases. There is no evidence of focal consolidation, pleural effusion or pneumothorax. Radiopaque surgical clips are seen overlying the lateral chest wall soft tissues, bilaterally. Multilevel degenerative changes are noted throughout the thoracic spine. IMPRESSION: Mild bibasilar atelectasis. Electronically Signed   By: Aram Candela M.D.   On: 11/06/2022 23:08    Labs:  CBC: Recent Labs    11/11/22 0306 11/12/22 0218 11/13/22 0340 11/19/22 0756  WBC 16.3* 19.4* 22.6* 8.0  HGB 9.3* 9.5* 9.2* 10.7*  HCT 29.9* 31.1* 29.9* 34.7*  PLT 427* 406* 371 210    COAGS: Recent Labs    06/18/22 2139 11/06/22 2018 11/07/22 0202  INR 1.1 1.1 1.2  APTT 27 30  --     BMP: Recent Labs    11/11/22 0306 11/12/22 0218 11/13/22 0340 11/19/22 0756  NA 136 135 136 137  K 3.8 3.3* 4.0 4.5  CL 99 100 101 101  CO2 25 24 22 26   GLUCOSE 116* 122* 114* 173*  BUN 13 13 14  25*  CALCIUM 8.9 8.9 8.7* 9.5  CREATININE 1.35* 1.19* 1.13* 1.19*  GFRNONAA 44* 51* 54* 51*    LIVER FUNCTION TESTS: Recent Labs    11/11/22 0306 11/12/22 0218 11/13/22 0340 11/19/22 0756  BILITOT 0.7 0.6 0.7 0.3  AST 19 18 20 16   ALT 14 14 13 16   ALKPHOS 90 96 76 75  PROT 6.0* 6.1* 6.1* 6.9  ALBUMIN 2.9*  3.0* 3.1* 3.7    TUMOR MARKERS: No results for input(s): "AFPTM", "CEA", "CA199", "CHROMGRNA" in the last 8760 hours.  Assessment and Plan:  62 year old female with history of breast cancer status post bilateral mastectomy 07/19/2022 with postoperative right chest wall seroma requiring IR drain placement 11/12/2022 with Dr. Miles Costain.  Patient presented to drain clinic 11/24/2022 for evaluation and denied complications of drain at the time.  Patient called this morning and reports that she is unable to flush drain.   Drain unremarkable with sutures and StatLock intact.  There is approximately 10 cc thin, serous fluid with white debris in JP drain.  Drain flushed with great difficulty using approximately 25 cc normal saline with inability to aspirate.  Discussed case with Dr. Milford Cage who recommends scheduling patient for right chest wall serous drain exchange without sedation.  Patient and husband are in agreement for simple drain exchange without sedation. Will coordinate drain exchange with [facility].    Thank you for this interesting consult.  I greatly enjoyed meeting [name] and look forward to participating in their care.  A copy of this report was sent to the requesting provider on this date.  Electronically Signed: Shon Hough 11/25/2022, 12:59 PM   I spent a total of 20 minutes in face to face in clinical consultation, greater than 50% of which was counseling/coordinating care for right chest wall seroma drain malfunction.

## 2022-11-26 ENCOUNTER — Ambulatory Visit (HOSPITAL_COMMUNITY)
Admission: RE | Admit: 2022-11-26 | Discharge: 2022-11-26 | Disposition: A | Discharge status: Home / Self Care | Payer: Medicaid Other | Source: Ambulatory Visit | Attending: Internal Medicine | Admitting: Internal Medicine

## 2022-11-26 ENCOUNTER — Other Ambulatory Visit: Payer: Self-pay | Admitting: Internal Medicine

## 2022-11-26 DIAGNOSIS — N6489 Other specified disorders of breast: Secondary | ICD-10-CM | POA: Insufficient documentation

## 2022-11-26 DIAGNOSIS — Z4803 Encounter for change or removal of drains: Secondary | ICD-10-CM | POA: Diagnosis present

## 2022-11-26 HISTORY — PX: IR CATHETER TUBE CHANGE: IMG717

## 2022-11-26 HISTORY — PX: IR US GUIDANCE: IMG2393

## 2022-11-26 MED ORDER — LIDOCAINE HCL 1 % IJ SOLN
INTRAMUSCULAR | Status: AC
  Filled 2022-11-26: qty 20

## 2022-11-26 MED ORDER — IOHEXOL 300 MG/ML  SOLN
50.0000 mL | Freq: Once | INTRAMUSCULAR | Status: AC | PRN
  Administered 2022-11-26: 5 mL

## 2022-11-26 MED ORDER — LIDOCAINE HCL 1 % IJ SOLN: 10.0000 mL | Freq: Once | INTRAMUSCULAR | Status: DC

## 2022-11-26 NOTE — Procedures (Signed)
Interventional Radiology Procedure Note  Procedure: Image guided drain exchange, right breast seroma. Upsize to  42F pigtail drain.  Complications: None  EBL: None    Recommendations: - Routine drain care, with sterile flushes, record output - follow up in clinic on schedule.  - routine wound care - advise external compression, such as ACE wrap  Signed,  Yvone Neu. Loreta Ave, DO

## 2022-11-28 NOTE — Progress Notes (Signed)
Radiation Oncology         [phone] ________________________________  Name: [name] MRN: [id number]  Date: 11/29/2022  DOB: 1960-11-27  Follow-Up Visit Note  Outpatient  CC: Fatima Sanger, FNP  Serena Croissant, MD  Diagnosis:      ICD-10-CM   1. Malignant neoplasm of overlapping sites of left breast in female, estrogen receptor positive (HCC)  C50.812    Z17.0      S/p bilateral mastectomies w/ bilateral SLN evaluation, followed by adjuvant chemotherapy:   -- Left Breast: Invasive and in situ ductal carcinoma of overlapping sites, ER+ / PR+ / Her2-, Grade 2 - clean margins and negative nodes  -- Right Breast: UOQ, Invasive and in situ ductal carcinoma, ER+ / PR- / Her2-, Grade 3 - clean margins and negative nodes   Pre-surgery:  Stage IB (cT2, cN0, cM0) Overlapping sites of the left breast: -- 2 o'clock left breast : Invasive Ductal Carcinoma with PNI and intermediate grade DCIS, ER+ / PR- / Her2-, Grade 1 --Left breast calcifications: Invasive Lobular Carcinoma with associated high-grade LCIS, ER+ / PR- / Her2-, grade 2    Stage IIA (cT2, cN0, cM0) Right Breast UOQ Invasive Ductal Carcinoma, ER- / PR- / Her2-, Grade 2  CHIEF COMPLAINT: Here to discuss management of bilateral breast cancers  Narrative:  The patient returns today for follow-up.     On her breast clinic consultation date of 05/26/22, she underwent genetic testing. Results showed no clinically significant variants detected by BRCAplus or +RNAinsight testing.  Breast or nodal biopsies, since consultation, involved (dates and results as follows): She underwent additional biopsies of the right breast on her consultation date (05/26/22); detailed as follows:  -- Biopsy of the upper outer right breast showed grade 1 invasive ductal carcinoma measuring 2 mm in the greatest linear extent of the sample with a background consisting of complex sclerosing lesion.  -- Biopsy of the lower outer right breast  showed benign findings.  Since her consultation date, she opted to proceed with bilateral total mastectomies with bilateral SLN biopsies on 07/19/22 under the care of Dr. Carolynne Edouard. Pathology from the procedure revealed:  -- Left Breast: tumor size of 6.8 cm; histology of grade 2 invasive ductal carcinoma with intermediate grade DCIS; all margins negative for invasive and in situ carcinoma; margin status to invasive and in situ disease of 21 mm from the posterior margin; nodal status of 5/5 right axillary SLN excisions negative for carcinoma;  ER status: 100% positive and PR status 5% positive, both with strong staining intensity; Proliferation marker Ki67 at <1%; Her2 status negative; Grade 2. -- Right Breast: tumors the size of 8.5 cm and 1.5 cm; histology of grade 3 invasive ductal carcinoma with high grade DCIS and necrosis; all margins negative for both invasive and in situ carcinoma; margins status to invasive and in situ disease of 28 mm from the posterior margin; nodal status of 3/3 right axillary SLN excisions negative for carcinoma. ER status 95% positive with strong staining intensity; PR status negative; Proliferation marker Ki67 at 15%; Her2 status negative.   Systemic therapy, if applicable, involved (dates and therapy as follows): She has been treated with adjuvant chemotherapy consisting of CMF x 4 cycles from 08/20/22 through 10/22/22 under the care of Dr. Pamelia Hoit. Chemo toxicities encountered by the patient have included: mouth sores, mild fatigue, and mild nausea. She was initially planned to receive 6 cycles of CMF, however, she had consistent issues with elevated serum creatinine and Dr.  Gudena advised discontinuing treatment s/p cycle 4.   She was also hospitalized from 11/06/22 through 11/13/22 for management of toxic metaboilc encephalopathy (neutropenic fever and severe sepsis) secondary to PNA. Hospital course included pressor support (which was eventually weaned), cefepime, and  doxycycline. While admitted, the patient was also noted to have right breast seroma which was confirmed via chest ultrasound which showed a 9.9 cm fluid collected in the right lateral chest, as well as a 1 cm fluid collected in the left lateral chest. IR was consulted and she underwent drain placement.   The patient was also hospitalized prior to undergoing breast surgery from 06/18/22 through 06/21/22 for management of acute metabolic encephalopathy in the setting of pyelonephritis. She has a chest CT performed while inpatient which was possibly suggestive of PNA but she was completely asymptomatic. Chest CT also demonstrated a 8 mm RLL pulmonary nodule as well as a 3 mm nodule in the right lung apex. Hospital course included Rocephin/Zithromax. With regards to the pulmonary nodules, she has been advised to proceed with repeat imaging of the chest in one year. (She also had a CT of the head performed on 01/06 which showed no acute intracranial pathology).    Symptomatically, the patient reports: ***        ALLERGIES:  is allergic to codeine phosphate, lipitor [atorvastatin], simvastatin, sulfamethoxazole, and tape.  Meds: Current Outpatient Medications  Medication Sig Dispense Refill   albuterol (VENTOLIN HFA) 108 (90 Base) MCG/ACT inhaler Inhale 2 puffs into the lungs every 6 (six) hours as needed for wheezing or shortness of breath. 6.7 g 0   ALPRAZolam (XANAX) 0.5 MG tablet Take 0.5 mg by mouth 2 (two) times daily as needed for sleep or anxiety.  2   amitriptyline (ELAVIL) 50 MG tablet Take 50 mg by mouth at bedtime.     amLODipine (NORVASC) 10 MG tablet Take 10 mg by mouth daily.     apixaban (ELIQUIS) 5 MG TABS tablet Take 1 tablet (5 mg total) by mouth 2 (two) times daily. 60 tablet 5   ezetimibe (ZETIA) 10 MG tablet Take 10 mg by mouth daily.     LIQUID ALLERGY RELIEF 12.5 MG/5ML liquid Take by mouth.     loratadine (CLARITIN) 10 MG tablet Take 10 mg by mouth daily.     magic mouthwash  w/lidocaine SOLN Take 5 mLs by mouth 4 (four) times daily as needed for mouth pain. 240 mL 1   metFORMIN (GLUCOPHAGE-XR) 500 MG 24 hr tablet Take 1,000 mg by mouth 2 (two) times daily.     MOUNJARO 5 MG/0.5ML Pen Inject 5 mg into the skin every Monday.     oxyCODONE ER (XTAMPZA ER) 36 MG C12A Take 36 mg by mouth in the morning and at bedtime.     pantoprazole (PROTONIX) 40 MG tablet Take 40 mg by mouth daily.     Pediatric Multivitamins-Iron (FLINTSTONES PLUS EXTRA IRON PO) Take 1 tablet by mouth daily.     pregabalin (LYRICA) 100 MG capsule Take 100 mg by mouth 2 (two) times daily.     promethazine (PHENERGAN) 25 MG tablet TAKE 1 TABLET BY MOUTH EVERY 6 HOURS AS NEEDED FOR NAUSEA OR VOMITING. 30 tablet 0   sodium chloride flush 0.9 % SOLN injection Flush the drain with 5 mL every day 300 mL 3   No current facility-administered medications for this encounter.    Physical Findings:  vitals were not taken for this visit. .     General: Alert  and oriented, in no acute distress HEENT: Head is normocephalic. Extraocular movements are intact. Oropharynx is clear. Neck: Neck is supple, no palpable cervical or supraclavicular lymphadenopathy. Heart: Regular in rate and rhythm with no murmurs, rubs, or gallops. Chest: Clear to auscultation bilaterally, with no rhonchi, wheezes, or rales. Abdomen: Soft, nontender, nondistended, with no rigidity or guarding. Extremities: No cyanosis or edema. Lymphatics: see Neck Exam Musculoskeletal: symmetric strength and muscle tone throughout. Neurologic: No obvious focalities. Speech is fluent.  Psychiatric: Judgment and insight are intact. Affect is appropriate. Breast exam reveals ***  Lab Findings: Lab Results  Component Value Date   WBC 8.0 11/19/2022   HGB 10.7 (L) 11/19/2022   HCT 34.7 (L) 11/19/2022   MCV 94.3 11/19/2022   PLT 210 11/19/2022    @LASTCHEMISTRY @  Radiographic Findings: IR Catheter Tube Change  Result Date:  11/26/2022 INDICATION: 62 year old female with a history of breast seroma and indwelling seroma drain malfunction. She presents for exchange EXAM: IMAGE GUIDED BREAST INJECTION AND EXCHANGE/UPSIZE LIMITED ULTRASOUND OF THE SOFT TISSUES CHEST MEDICATIONS: None ANESTHESIA/SEDATION: None COMPLICATIONS: None PROCEDURE: Informed written consent was obtained from the patient after a thorough discussion of the procedural risks, benefits and alternatives. All questions were addressed. Maximal Sterile Barrier Technique was utilized including caps, mask, sterile gowns, sterile gloves, sterile drape, hand hygiene and skin antiseptic. A timeout was performed prior to the initiation of the procedure. Patient was position supine under the image intensifier. The right breast in the indwelling drain were prepped and draped in the usual sterile fashion. Limited ultrasound with grayscale performed to identify any residual fluid pockets given the absence of drainage from the drain. A sizable some fluid collection remains within the seroma cavity and thus we proceeded with exchange. 1% lidocaine was used for local anesthesia. Contrast was injected confirming location of the residual seroma. Modified Seldinger technique was attempted, with the wire meeting resistance. Soft tissue blockage of the drain tract precluded a standard exchange. The drain was removed and a Kumpe the catheter was navigated through the drain tract into the seroma cavity. Contrast confirmed location. Wire was placed and then a new 12 [place] skater type pigtail drainage catheter was advanced into the cavity. Aspiration of residual seroma was performed. Drain was sutured in position and attached to bulb suction. Final images were stored. Patient tolerated the procedure well and remained hemodynamically stable throughout. No complications were encountered and no significant blood loss. IMPRESSION: Ultrasound demonstrates residual seroma cavity on the right chest  wall. Status post image guided exchange of seroma drain for a new 12 French seroma drain to bulb suction. Signed, Yvone Neu. Miachel Roux, RPVI [facility] Radiology Electronically Signed   By: Gilmer Mor D.O.   On: 11/26/2022 09:18   IR US Guidance  Result Date: 11/26/2022 INDICATION: 62 year old female with a history of breast seroma and indwelling seroma drain malfunction. She presents for exchange EXAM: IMAGE GUIDED BREAST INJECTION AND EXCHANGE/UPSIZE LIMITED ULTRASOUND OF THE SOFT TISSUES CHEST MEDICATIONS: None ANESTHESIA/SEDATION: None COMPLICATIONS: None PROCEDURE: Informed written consent was obtained from the patient after a thorough discussion of the procedural risks, benefits and alternatives. All questions were addressed. Maximal Sterile Barrier Technique was utilized including caps, mask, sterile gowns, sterile gloves, sterile drape, hand hygiene and skin antiseptic. A timeout was performed prior to the initiation of the procedure. Patient was position supine under the image intensifier. The right breast in the indwelling drain were prepped and draped in the usual sterile  fashion. Limited ultrasound with grayscale performed to identify any residual fluid pockets given the absence of drainage from the drain. A sizable some fluid collection remains within the seroma cavity and thus we proceeded with exchange. 1% lidocaine was used for local anesthesia. Contrast was injected confirming location of the residual seroma. Modified Seldinger technique was attempted, with the wire meeting resistance. Soft tissue blockage of the drain tract precluded a standard exchange. The drain was removed and a Kumpe the catheter was navigated through the drain tract into the seroma cavity. Contrast confirmed location. Wire was placed and then a new 12 [place] skater type pigtail drainage catheter was advanced into the cavity. Aspiration of residual seroma was  performed. Drain was sutured in position and attached to bulb suction. Final images were stored. Patient tolerated the procedure well and remained hemodynamically stable throughout. No complications were encountered and no significant blood loss. IMPRESSION: Ultrasound demonstrates residual seroma cavity on the right chest wall. Status post image guided exchange of seroma drain for a new 12 French seroma drain to bulb suction. Signed, Yvone Neu. Miachel Roux, RPVI [facility] Radiology Electronically Signed   By: Gilmer Mor D.O.   On: 11/26/2022 09:18   IR US Guide Bx Asp/Drain  Result Date: 11/12/2022 INDICATION: RIGHT MASTECTOMY SITE SUPERFICIAL POSTOPERATIVE SEROMA EXAM: ULTRASOUND RIGHT MASTECTOMY SITE SEROMA DRAIN MEDICATIONS: 1% lidocaine local ANESTHESIA/SEDATION: Moderate (conscious) sedation was employed during this procedure. A total of Versed 1.0 mg and Fentanyl 50 mcg was administered intravenously by the radiology nurse. Total intra-service moderate Sedation Time: 10 minutes. The patient's level of consciousness and vital signs were monitored continuously by radiology nursing throughout the procedure under my direct supervision. COMPLICATIONS: None immediate. PROCEDURE: Informed written consent was obtained from the patient after a thorough discussion of the procedural risks, benefits and alternatives. All questions were addressed. Maximal Sterile Barrier Technique was utilized including caps, mask, sterile gowns, sterile gloves, sterile drape, hand hygiene and skin antiseptic. A timeout was performed prior to the initiation of the procedure. Previous imaging reviewed. Preliminary ultrasound performed. The right breast superficial seroma was marked for access. Under sterile conditions local anesthesia, the 18 gauge introducer needle was advanced percutaneously into the seroma. Needle position confirmed with ultrasound. Images obtained for  documentation. Guidewire inserted followed by tract dilatation to insert a 10 [place] drain. Drain catheter position confirmed with ultrasound. Syringe aspiration yielded 120 cc clear serosanguineous seroma fluid. Sample sent for culture. This completely collapsed the seroma. Catheter secured with Prolene suture and connected to external suction bulb. Sterile dressing applied. No immediate complication. Patient tolerated the procedure well. IMPRESSION: Successful ultrasound right mastectomy site seroma drain placement. Sample sent for culture. Electronically Signed   By: Judie Petit.  Shick M.D.   On: 11/12/2022 14:36   [place] CHEST SOFT TISSUE  Result Date: 11/11/2022 CLINICAL DATA:  Bilateral total mastectomy 07/19/2022. Breast swelling. EXAM: ULTRASOUND OF CHEST SOFT TISSUES TECHNIQUE: Ultrasound examination of the chest wall soft tissues was performed in the area of clinical concern. COMPARISON:  None Available. FINDINGS: In the left lateral chest wall, there is a small fluid collection measuring up to 1 cm. This is at the incision site. This may reflect a small postoperative seroma. In the right lateral chest wall, there is a complex cystic area measuring 9.9 x 9.0 x 3.5 cm. Internal echoes and septations. This could reflect liquified hematoma or abscess. IMPRESSION: Right lateral chest wall fluid collection measuring up to 9.9 cm with internal echoes  and septations, likely liquified hematoma or abscess. Left lateral chest wall fluid collection measuring up to 1 cm, likely postoperative seroma. Electronically Signed   By: Charlett Nose M.D.   On: 11/11/2022 21:15   [place] CHEST SOFT TISSUE  Result Date: 11/11/2022 CLINICAL DATA:  Chest swelling EXAM: ULTRASOUND OF CHEST SOFT TISSUES TECHNIQUE: Ultrasound examination of the chest wall soft tissues was performed in the area of clinical concern. COMPARISON:  None Available. FINDINGS: Targeted sonography of right lateral chest is performed in the region of swelling. Complex  fluid collection, mostly anechoic but contains thin internal septations, this measures 9.8 x 3.5 by 9 cm. Mild overlying skin thickening. IMPRESSION: Status post mastectomy. Large multi-septated fluid collection within the right lateral chest measuring up to 9.8 cm, corresponding to the area of swelling. This is nonspecific in appearance and could represent postoperative collection, sterility is indeterminate by ultrasound. Electronically Signed   By: Jasmine Pang M.D.   On: 11/11/2022 20:00   VAS [place] LOWER EXTREMITY VENOUS (DVT)  Result Date: 11/08/2022  Lower Venous DVT Study Patient Name:  CATHARINA STEEPLES  Date of Exam:   11/07/2022 Medical Rec #: [id number]        Accession #:    [id number] Date of Birth: Sep 28, 1960        Patient Gender: F Patient Age:   77 years Exam Location[facility]  [facility] Procedure:      VAS [place] LOWER EXTREMITY VENOUS (DVT) Referring Phys: Lyda Perone --------------------------------------------------------------------------------  Indications: Swelling, Pain, and Elevated D-Dimer.  Risk Factors: Breast Cancer, status post bilateral mastectomy. On Chemo. Limitations: Edema. Comparison Study: No prior study on file Performing Technologist: Sherren Kerns RVS  Examination Guidelines: A complete evaluation includes B-mode imaging, spectral Doppler, color Doppler, and power Doppler as needed of all accessible portions of each vessel. Bilateral testing is considered an integral part of a complete examination. Limited examinations for reoccurring indications may be performed as noted. The reflux portion of the exam is performed with the patient in reverse Trendelenburg.  +---------+---------------+---------+-----------+---------------+--------------+ RIGHT    CompressibilityPhasicitySpontaneityProperties     Thrombus Aging +---------+---------------+---------+-----------+---------------+--------------+ CFV      Full                               pulsatile                                                                  waveforms                     +---------+---------------+---------+-----------+---------------+--------------+ SFJ      Full                                                             +---------+---------------+---------+-----------+---------------+--------------+ FV Prox  Full                                                             +---------+---------------+---------+-----------+---------------+--------------+  FV Mid   Full                               pulsatile                                                                 waveforms                     +---------+---------------+---------+-----------+---------------+--------------+ FV DistalFull                                                             +---------+---------------+---------+-----------+---------------+--------------+ PFV      Full                                                             +---------+---------------+---------+-----------+---------------+--------------+ POP      Full                               pulsatile                                                                 waveforms                     +---------+---------------+---------+-----------+---------------+--------------+ PTV                                                        Not well                                                                  visualized     +---------+---------------+---------+-----------+---------------+--------------+ PERO     None                                              Acute          +---------+---------------+---------+-----------+---------------+--------------+   +---------+---------------+---------+-----------+---------------+--------------+ LEFT     CompressibilityPhasicitySpontaneityProperties     Thrombus Aging +---------+---------------+---------+-----------+---------------+--------------+  CFV      Full  pulsatile                                                                 waveforms                     +---------+---------------+---------+-----------+---------------+--------------+ SFJ      Full                                                             +---------+---------------+---------+-----------+---------------+--------------+ FV Prox  Full                                                             +---------+---------------+---------+-----------+---------------+--------------+ FV Mid   Full                               pulsatile                                                                 waveforms                     +---------+---------------+---------+-----------+---------------+--------------+ FV DistalFull                                                             +---------+---------------+---------+-----------+---------------+--------------+ PFV      Full                                                             +---------+---------------+---------+-----------+---------------+--------------+ POP                                         pulsatile      patent by                                                  waveforms      color and  Doppler        +---------+---------------+---------+-----------+---------------+--------------+ PTV                                                        Not well                                                                  visualized     +---------+---------------+---------+-----------+---------------+--------------+ PERO     None                                              Acute          +---------+---------------+---------+-----------+---------------+--------------+     Summary: RIGHT: - Findings consistent with acute deep vein thrombosis involving  the right peroneal veins. - Technically difficult and limited exam in the calf, however, the peroneal veins appear dilated, do not fully compress, and are absent of color  LEFT: - Findings consistent with acute deep vein thrombosis involving the left peroneal veins. - Technically difficult and limited exam in the calf, however, the peroneal veins appear dilated, do not fully compress, and are absent of color  *See table(s) above for measurements and observations. Electronically signed by Waverly Ferrari MD on 11/08/2022 at 9:29:33 AM.    Final    US RENAL  Result Date: 11/07/2022 CLINICAL DATA:  Acute kidney injury EXAM: RENAL / URINARY TRACT ULTRASOUND COMPLETE COMPARISON:  Abdominal CT 06/19/2022 FINDINGS: Right Kidney: Not sonographically visualized, severely atrophic and likely nonfunctional by prior CT. Left Kidney: volume: 180 mL. Echogenicity within normal limits. No mass or hydronephrosis visualized. Bladder: Collapsed at time of imaging. Other: Echogenic liver from steatosis, confirmed on prior CT IMPRESSION: 1. Severe right renal atrophy with normal appearing left kidney. 2. Hepatic steatosis. Electronically Signed   By: Tiburcio Pea M.D.   On: 11/07/2022 09:17   DG Chest Portable 1 View  Result Date: 11/06/2022 CLINICAL DATA:  Weakness and confusion. EXAM: PORTABLE CHEST 1 VIEW COMPARISON:  August 12, 2022 FINDINGS: Stable right-sided venous Port-A-Cath positioning is seen. The heart size and mediastinal contours are within normal limits. Mild atelectasis is seen within the bilateral lung bases. There is no evidence of focal consolidation, pleural effusion or pneumothorax. Radiopaque surgical clips are seen overlying the lateral chest wall soft tissues, bilaterally. Multilevel degenerative changes are noted throughout the thoracic spine. IMPRESSION: Mild bibasilar atelectasis. Electronically Signed   By: Aram Candela M.D.   On: 11/06/2022 23:08    Impression/Plan: We discussed  adjuvant radiotherapy today.  I recommend *** in order to ***.  I reviewed the logistics, benefits, risks, and potential side effects of this treatment in detail. Risks may include but not necessary be limited to acute and late injury tissue in the radiation fields such as skin irritation (change in color/pigmentation, itching, dryness, pain, peeling). She may experience fatigue. We also discussed possible risk of long term cosmetic changes or scar tissue. There is also a smaller risk for lung toxicity, ***cardiac toxicity, ***  brachial plexopathy, ***lymphedema, ***musculoskeletal changes, ***rib fragility or ***induction of a second malignancy, ***late chronic non-healing soft tissue wound.    The patient asked good questions which I answered to her satisfaction. She is enthusiastic about proceeding with treatment. A consent form has been *** signed and placed in her chart.  A total of *** medically necessary complex treatment devices will be fabricated and supervised by me: *** fields with MLCs for custom blocks to protect heart, and lungs;  and, a Vac-lok. MORE COMPLEX DEVICES MAY BE MADE IN DOSIMETRY FOR FIELD IN FIELD BEAMS FOR DOSE HOMOGENEITY.  I have requested : 3D Simulation which is medically necessary to give adequate dose to at risk tissues while sparing lungs and heart.  I have requested a DVH of the following structures: lungs, heart, *** lumpectomy cavity.    The patient will receive *** Gy in *** fractions to the *** with *** fields.  This will be *** followed by a boost.  On date of service, in total, I spent *** minutes on this encounter. Patient was seen in person.  _____________________________________   Lonie Peak, MD  This document serves as a record of services personally performed by Lonie Peak, MD. It was created on her behalf by Neena Rhymes, a trained medical scribe. The creation of this record is based on the scribe's personal observations and the provider's statements  to them. This document has been checked and approved by the attending provider.

## 2022-11-29 ENCOUNTER — Other Ambulatory Visit: Payer: Self-pay

## 2022-11-29 ENCOUNTER — Ambulatory Visit: Payer: Self-pay

## 2022-11-29 ENCOUNTER — Ambulatory Visit
Admission: RE | Admit: 2022-11-29 | Discharge: 2022-11-29 | Disposition: A | Discharge status: Home / Self Care | Payer: Medicaid Other | Source: Ambulatory Visit | Attending: Radiation Oncology | Admitting: Radiation Oncology

## 2022-11-29 ENCOUNTER — Encounter: Payer: Self-pay | Admitting: Radiation Oncology

## 2022-11-29 ENCOUNTER — Encounter: Payer: Self-pay | Admitting: *Deleted

## 2022-11-29 ENCOUNTER — Ambulatory Visit: Payer: BC Managed Care – PPO | Admitting: Radiation Oncology

## 2022-11-29 VITALS — BP 103/75 | HR 103 | Temp 97.8°F | Resp 18 | Ht 66.0 in | Wt 208.4 lb

## 2022-11-29 DIAGNOSIS — C50812 Malignant neoplasm of overlapping sites of left female breast: Secondary | ICD-10-CM | POA: Insufficient documentation

## 2022-11-29 DIAGNOSIS — Z9013 Acquired absence of bilateral breasts and nipples: Secondary | ICD-10-CM | POA: Diagnosis not present

## 2022-11-29 DIAGNOSIS — Z171 Estrogen receptor negative status [ER-]: Secondary | ICD-10-CM | POA: Insufficient documentation

## 2022-11-29 DIAGNOSIS — Z7901 Long term (current) use of anticoagulants: Secondary | ICD-10-CM | POA: Insufficient documentation

## 2022-11-29 DIAGNOSIS — M47814 Spondylosis without myelopathy or radiculopathy, thoracic region: Secondary | ICD-10-CM | POA: Insufficient documentation

## 2022-11-29 DIAGNOSIS — Z7984 Long term (current) use of oral hypoglycemic drugs: Secondary | ICD-10-CM | POA: Diagnosis not present

## 2022-11-29 DIAGNOSIS — Z79899 Other long term (current) drug therapy: Secondary | ICD-10-CM | POA: Insufficient documentation

## 2022-11-29 DIAGNOSIS — Z170 Estrogen receptor positive status [ER+]: Secondary | ICD-10-CM | POA: Diagnosis not present

## 2022-11-29 DIAGNOSIS — C50411 Malignant neoplasm of upper-outer quadrant of right female breast: Secondary | ICD-10-CM | POA: Diagnosis not present

## 2022-11-29 DIAGNOSIS — K760 Fatty (change of) liver, not elsewhere classified: Secondary | ICD-10-CM | POA: Diagnosis not present

## 2022-11-29 NOTE — Patient Instructions (Signed)
Visit Information  Thank you for taking time to visit with me today. Please don't hesitate to contact me if I can be of assistance to you.   Following are the goals we discussed today:   Goals Addressed             This Visit's Progress    Beating Breast Cancer       Patient Goals/Self Care Activities: -Patient/Caregiver will self-administer medications as prescribed as evidenced by self-report/primary caregiver report  -Patient/Caregiver will attend all scheduled provider appointments as evidenced by clinician review of documented attendance to scheduled appointments and patient/caregiver report -Patient/Caregiver will call provider office for new concerns or questions as evidenced by review of documented incoming telephone call notes and patient report  -check blood sugar at prescribed times -record values and write them down take them to all doctor visits    -Patient chemo discontinued.  Continues to have right side drain.  Radiation on hold pending drain removal which patient states maybe 5 weeks.    Patient reports blood sugars up and down.         Our next appointment is by telephone on 12/27/22 at 1000  Please call the care guide team at [phone] if you need to cancel or reschedule your appointment.   If you are experiencing a Mental Health or Behavioral Health Crisis or need someone to talk to, please call the Suicide and Crisis Lifeline: 988   The patient verbalized understanding of instructions, educational materials, and care plan provided today and DECLINED offer to receive copy of patient instructions, educational materials, and care plan.   The patient has been provided with contact information for the care management team and has been advised to call with any health related questions or concerns.   Bary Leriche, RN, MSN [facility] Care Management Care Management Coordinator Direct Line [phone]

## 2022-11-29 NOTE — Patient Outreach (Signed)
  Care Coordination   Follow Up Visit Note   11/29/2022 Name: [name] MRN: [id number] DOB: 1961-05-03  [name] is a 62 y.o. year old female who sees Larose Hires, Gaylyn Lambert, FNP for primary care. I spoke with  [name] by phone today.  What matters to the patients health and wellness today?  Maintain health    Goals Addressed             This Visit's Progress    Beating Breast Cancer       Patient Goals/Self Care Activities: -Patient/Caregiver will self-administer medications as prescribed as evidenced by self-report/primary caregiver report  -Patient/Caregiver will attend all scheduled provider appointments as evidenced by clinician review of documented attendance to scheduled appointments and patient/caregiver report -Patient/Caregiver will call provider office for new concerns or questions as evidenced by review of documented incoming telephone call notes and patient report  -check blood sugar at prescribed times -record values and write them down take them to all doctor visits    -Patient chemo discontinued.  Continues to have right side drain.  Radiation on hold pending drain removal which patient states maybe 5 weeks.    Patient reports blood sugars up and down.         SDOH assessments and interventions completed:  Yes     Care Coordination Interventions:  Yes, provided   Follow up plan: Follow up call scheduled for July    Encounter Outcome:  Pt. Visit Completed   Bary Leriche, RN, MSN [facility] Care Management Care Management Coordinator Direct Line [phone]

## 2022-12-01 ENCOUNTER — Encounter: Payer: Self-pay | Admitting: Hematology and Oncology

## 2022-12-01 ENCOUNTER — Other Ambulatory Visit (HOSPITAL_COMMUNITY): Payer: Self-pay

## 2022-12-01 MED ORDER — PREGABALIN 100 MG PO CAPS
100.0000 mg | ORAL_CAPSULE | Freq: Two times a day (BID) | ORAL | 1 refills | Status: AC
  Filled 2022-12-01: qty 60, 30d supply, fill #0

## 2022-12-01 MED ORDER — XTAMPZA ER 36 MG PO C12A
36.0000 mg | EXTENDED_RELEASE_CAPSULE | Freq: Two times a day (BID) | ORAL | 0 refills | Status: DC
  Filled 2023-01-13: qty 60, 30d supply, fill #0

## 2022-12-01 MED ORDER — XTAMPZA ER 36 MG PO C12A
36.0000 mg | EXTENDED_RELEASE_CAPSULE | Freq: Two times a day (BID) | ORAL | 0 refills | Status: DC
  Filled 2022-12-01: qty 60, 30d supply, fill #0

## 2022-12-02 ENCOUNTER — Other Ambulatory Visit (HOSPITAL_COMMUNITY): Payer: Self-pay

## 2022-12-02 ENCOUNTER — Encounter: Payer: Self-pay | Admitting: Radiation Oncology

## 2022-12-02 ENCOUNTER — Ambulatory Visit: Payer: Self-pay | Admitting: General Surgery

## 2022-12-03 ENCOUNTER — Ambulatory Visit: Payer: BC Managed Care – PPO

## 2022-12-03 ENCOUNTER — Other Ambulatory Visit (HOSPITAL_COMMUNITY): Payer: Self-pay

## 2022-12-03 ENCOUNTER — Ambulatory Visit: Payer: BC Managed Care – PPO | Admitting: Hematology and Oncology

## 2022-12-03 ENCOUNTER — Other Ambulatory Visit: Payer: BC Managed Care – PPO

## 2022-12-06 ENCOUNTER — Encounter: Payer: Self-pay | Admitting: Hematology and Oncology

## 2022-12-06 ENCOUNTER — Ambulatory Visit
Admission: RE | Admit: 2022-12-06 | Discharge: 2022-12-06 | Disposition: A | Discharge status: Home / Self Care | Payer: PRIVATE HEALTH INSURANCE | Source: Ambulatory Visit | Attending: Physician Assistant

## 2022-12-06 DIAGNOSIS — N6489 Other specified disorders of breast: Secondary | ICD-10-CM

## 2022-12-06 NOTE — Progress Notes (Signed)
Patient ID: [name], female   DOB: August 18, 1960, 62 y.o.   MRN: [id number]        Chief Complaint: Right breast seroma drain  Referring Physician(s): Watterson,Shannon A  History of Present Illness: [name] is a 62 y.o. female with past medical history significant for hypertension, diabetes, hyperlipidemia and breast cancer, who underwent placement of an image guided right breast/chest wall seroma drainage catheter placement on 11/12/2022, subsequently exchanged and upsized on 11/26/2022.  She presents today to the interventional radiology drain clinic for drainage catheter evaluation and management.  Patient reports she is continuing to flush the drainage cath with 5 cc of normal saline twice per day.    She reports slightly reduced though persistent output of approximately 30 cc/day.  She maintains compliance with the recommended chest wall Ace bandage.  She is tolerating the drainage catheter well and is without specific complaint.  Past Medical History:  Diagnosis Date   Anemia    Arthritis    Asthma    Bladder spasms    Breast cancer (HCC)    Diabetes mellitus without complication (HCC)    GERD (gastroesophageal reflux disease)    Heart murmur    Hyperlipidemia    Hypertension     Past Surgical History:  Procedure Laterality Date   BACK SURGERY  1989, 1990, 2006   BREAST BIOPSY Left 05/14/2022   [place] LT BREAST BX W LOC DEV 1ST LESION IMG BX SPEC US GUIDE 05/14/2022 [facility]   BREAST BIOPSY Right 05/14/2022   MM RT BREAST BX W LOC DEV 1ST LESION IMAGE BX SPEC STEREO GUIDE 05/14/2022 [facility]   BREAST BIOPSY Left 05/14/2022   MM LT BREAST BX W LOC DEV 1ST LESION IMAGE BX SPEC STEREO GUIDE 05/14/2022 [facility]   BREAST BIOPSY Right 05/26/2022   MM RT BREAST BX W LOC DEV 1ST LESION IMAGE BX SPEC STEREO GUIDE 05/26/2022 [facility]   BREAST BIOPSY Right 05/26/2022   MM RT BREAST BX W LOC DEV EA AD LESION IMG BX SPEC STEREO GUIDE  05/26/2022 [facility]   BREAST LUMPECTOMY  06/14/1974   bi-lat , both benign    CHOLECYSTECTOMY  06/14/1996   IR CATHETER TUBE CHANGE  11/26/2022   IR US GUIDANCE  11/26/2022   IR US GUIDE BX ASP/DRAIN  11/12/2022   LEG SURGERY  06/15/2007   work related injury   PARTIAL HYSTERECTOMY     still has ovaries   PORTACATH PLACEMENT Right 08/12/2022   Procedure: INSERTION PORT-A-CATH;  Surgeon: Griselda Miner, MD;  Location: [facility];  Service: General;  Laterality: Right;   SENTINEL NODE BIOPSY Bilateral 07/19/2022   Procedure: BILATERAL SENTINEL NODE BIOPSY;  Surgeon: Griselda Miner, MD;  Location: [facility];  Service: General;  Laterality: Bilateral;   TOTAL MASTECTOMY Bilateral 07/19/2022   Procedure: BILATERAL TOTAL MASTECTOMY;  Surgeon: Griselda Miner, MD;  Location: [facility];  Service: General;  Laterality: Bilateral;    Allergies: Codeine phosphate, Lipitor [atorvastatin], Simvastatin, Sulfamethoxazole, and Tape  Medications: Prior to Admission medications   Medication Sig Start Date End Date Taking? Authorizing Provider  albuterol (VENTOLIN HFA) 108 (90 Base) MCG/ACT inhaler Inhale 2 puffs into the lungs every 6 (six) hours as needed for wheezing or shortness of breath. 07/16/20   Ghimire, Werner Lean, MD  ALPRAZolam Prudy Feeler) 0.5 MG tablet Take 0.5 mg by mouth 2 (two) times daily as needed for sleep or anxiety. 02/20/15   [provider]  amitriptyline Caleb Popp)  50 MG tablet Take 50 mg by mouth at bedtime.    [provider]  amLODipine (NORVASC) 10 MG tablet Take 10 mg by mouth daily. 05/05/20   [provider]  apixaban (ELIQUIS) 5 MG TABS tablet Take 1 tablet (5 mg total) by mouth 2 (two) times daily. 11/19/22 05/18/23  Serena Croissant, MD  ezetimibe (ZETIA) 10 MG tablet Take 10 mg by mouth daily.    [provider]  LIQUID ALLERGY RELIEF 12.5 MG/5ML liquid Take by mouth. 09/28/22   [provider]  loratadine (CLARITIN) 10 MG tablet Take 10 mg by mouth  daily.    [provider]  magic mouthwash w/lidocaine SOLN Take 5 mLs by mouth 4 (four) times daily as needed for mouth pain. 09/16/22   Serena Croissant, MD  metFORMIN (GLUCOPHAGE-XR) 500 MG 24 hr tablet Take 1,000 mg by mouth 2 (two) times daily. 06/09/20   [provider]  MOUNJARO 5 MG/0.5ML Pen Inject 5 mg into the skin every Monday. 11/01/22   [provider]  oxyCODONE ER (XTAMPZA ER) 36 MG C12A Take 36 mg by mouth in the morning and at bedtime.    [provider]  oxyCODONE ER (XTAMPZA ER) 36 MG C12A Take 1 capsule (36 mg total) by mouth every 12 (twelve) hours. 12/01/22     oxyCODONE ER (XTAMPZA ER) 36 MG C12A Take 1 capsule (36 mg total) by mouth every 12 (twelve) hours. 12/29/22     pantoprazole (PROTONIX) 40 MG tablet Take 40 mg by mouth daily.    [provider]  Pediatric Multivitamins-Iron (FLINTSTONES PLUS EXTRA IRON PO) Take 1 tablet by mouth daily.    [provider]  pregabalin (LYRICA) 100 MG capsule Take 100 mg by mouth 2 (two) times daily. 11/14/22   [provider]  pregabalin (LYRICA) 100 MG capsule Take 1 capsule (100 mg total) by mouth 2 (two) times daily. 12/01/22     promethazine (PHENERGAN) 25 MG tablet TAKE 1 TABLET BY MOUTH EVERY 6 HOURS AS NEEDED FOR NAUSEA OR VOMITING. 10/18/22   Serena Croissant, MD  sodium chloride flush 0.9 % SOLN injection Flush the drain with 5 mL every day 11/24/22   Villa Herb, PA-C     Family History  Problem Relation Age of Onset   Diabetes Mother    Hypertension Mother    Heart disease Mother    Hypertension Father    Diabetes Father    Heart disease Father    Breast cancer Sister 68   Lymphoma Paternal Grandfather        dx after 81   Neuropathy Neg Hx     Social History   Socioeconomic History   Marital status: Married    Spouse name: Ricky    Number of children: 2   Years of education: 9   Highest education level: Not on file  Occupational History   Occupation:  Environmental manager  Tobacco Use   Smoking status: Former    Packs/day: 0.50    Years: 37.00    Additional pack years: 0.00    Total pack years: 18.50    Types: Cigarettes    Quit date: 06/14/2013    Years since quitting: 9.4   Smokeless tobacco: Never  Vaping Use   Vaping Use: Never used  Substance and Sexual Activity   Alcohol use: No   Drug use: No   Sexual activity: Never    Comment: 62 YEARS OLD, NO MORE THAN 5 PARTNERS  Other Topics Concern   Not on file  Social History Narrative   Lives at home with husband   Caffeine use: none   Social Determinants of Health   Financial Resource Strain: Not on file  Food Insecurity: No Food Insecurity (06/19/2022)   Hunger Vital Sign    Worried About Running Out of Food in the Last Year: Never true    Ran Out of Food in the Last Year: Never true  Transportation Needs: No Transportation Needs (09/06/2022)   PRAPARE - Administrator, Civil Service (Medical): No    Lack of Transportation (Non-Medical): No  Physical Activity: Not on file  Stress: Not on file  Social Connections: Not on file    Review of Systems: A 12 point ROS discussed and pertinent positives are indicated in the HPI above.  All other systems are negative.  Review of Systems  Vital Signs: BP (!) 146/79 (BP Location: Right Leg, Patient Position: Sitting)   Pulse 93   Temp 98.4 F (36.9 C) (Oral)   LMP 04/22/1997   SpO2 92% Comment: room air    Physical Exam  Mallampati Score:     Imaging: IR Catheter Tube Change  Result Date: 11/26/2022 INDICATION: 62 year old female with a history of breast seroma and indwelling seroma drain malfunction. She presents for exchange EXAM: IMAGE GUIDED BREAST INJECTION AND EXCHANGE/UPSIZE LIMITED ULTRASOUND OF THE SOFT TISSUES CHEST MEDICATIONS: None ANESTHESIA/SEDATION: None COMPLICATIONS: None PROCEDURE: Informed written consent was obtained from the patient after a thorough discussion of the procedural risks,  benefits and alternatives. All questions were addressed. Maximal Sterile Barrier Technique was utilized including caps, mask, sterile gowns, sterile gloves, sterile drape, hand hygiene and skin antiseptic. A timeout was performed prior to the initiation of the procedure. Patient was position supine under the image intensifier. The right breast in the indwelling drain were prepped and draped in the usual sterile fashion. Limited ultrasound with grayscale performed to identify any residual fluid pockets given the absence of drainage from the drain. A sizable some fluid collection remains within the seroma cavity and thus we proceeded with exchange. 1% lidocaine was used for local anesthesia. Contrast was injected confirming location of the residual seroma. Modified Seldinger technique was attempted, with the wire meeting resistance. Soft tissue blockage of the drain tract precluded a standard exchange. The drain was removed and a Kumpe the catheter was navigated through the drain tract into the seroma cavity. Contrast confirmed location. Wire was placed and then a new 12 [place] skater type pigtail drainage catheter was advanced into the cavity. Aspiration of residual seroma was performed. Drain was sutured in position and attached to bulb suction. Final images were stored. Patient tolerated the procedure well and remained hemodynamically stable throughout. No complications were encountered and no significant blood loss. IMPRESSION: Ultrasound demonstrates residual seroma cavity on the right chest wall. Status post image guided exchange of seroma drain for a new 12 French seroma drain to bulb suction. Signed, Yvone Neu. Miachel Roux, RPVI [facility] Radiology Electronically Signed   By: Gilmer Mor D.O.   On: 11/26/2022 09:18   IR US Guidance  Result Date: 11/26/2022 INDICATION: 62 year old female with a history of breast seroma and indwelling seroma drain  malfunction. She presents for exchange EXAM: IMAGE GUIDED BREAST INJECTION AND EXCHANGE/UPSIZE LIMITED ULTRASOUND OF THE SOFT TISSUES CHEST MEDICATIONS: None ANESTHESIA/SEDATION: None COMPLICATIONS: None PROCEDURE: Informed written consent was obtained from the patient after a thorough discussion of the  procedural risks, benefits and alternatives. All questions were addressed. Maximal Sterile Barrier Technique was utilized including caps, mask, sterile gowns, sterile gloves, sterile drape, hand hygiene and skin antiseptic. A timeout was performed prior to the initiation of the procedure. Patient was position supine under the image intensifier. The right breast in the indwelling drain were prepped and draped in the usual sterile fashion. Limited ultrasound with grayscale performed to identify any residual fluid pockets given the absence of drainage from the drain. A sizable some fluid collection remains within the seroma cavity and thus we proceeded with exchange. 1% lidocaine was used for local anesthesia. Contrast was injected confirming location of the residual seroma. Modified Seldinger technique was attempted, with the wire meeting resistance. Soft tissue blockage of the drain tract precluded a standard exchange. The drain was removed and a Kumpe the catheter was navigated through the drain tract into the seroma cavity. Contrast confirmed location. Wire was placed and then a new 12 [place] skater type pigtail drainage catheter was advanced into the cavity. Aspiration of residual seroma was performed. Drain was sutured in position and attached to bulb suction. Final images were stored. Patient tolerated the procedure well and remained hemodynamically stable throughout. No complications were encountered and no significant blood loss. IMPRESSION: Ultrasound demonstrates residual seroma cavity on the right chest wall. Status post image guided exchange of seroma drain for a new 12 French seroma drain to bulb suction.  Signed, Yvone Neu. Miachel Roux, RPVI [facility] Radiology Electronically Signed   By: Gilmer Mor D.O.   On: 11/26/2022 09:18   IR US Guide Bx Asp/Drain  Result Date: 11/12/2022 INDICATION: RIGHT MASTECTOMY SITE SUPERFICIAL POSTOPERATIVE SEROMA EXAM: ULTRASOUND RIGHT MASTECTOMY SITE SEROMA DRAIN MEDICATIONS: 1% lidocaine local ANESTHESIA/SEDATION: Moderate (conscious) sedation was employed during this procedure. A total of Versed 1.0 mg and Fentanyl 50 mcg was administered intravenously by the radiology nurse. Total intra-service moderate Sedation Time: 10 minutes. The patient's level of consciousness and vital signs were monitored continuously by radiology nursing throughout the procedure under my direct supervision. COMPLICATIONS: None immediate. PROCEDURE: Informed written consent was obtained from the patient after a thorough discussion of the procedural risks, benefits and alternatives. All questions were addressed. Maximal Sterile Barrier Technique was utilized including caps, mask, sterile gowns, sterile gloves, sterile drape, hand hygiene and skin antiseptic. A timeout was performed prior to the initiation of the procedure. Previous imaging reviewed. Preliminary ultrasound performed. The right breast superficial seroma was marked for access. Under sterile conditions local anesthesia, the 18 gauge introducer needle was advanced percutaneously into the seroma. Needle position confirmed with ultrasound. Images obtained for documentation. Guidewire inserted followed by tract dilatation to insert a 10 [place] drain. Drain catheter position confirmed with ultrasound. Syringe aspiration yielded 120 cc clear serosanguineous seroma fluid. Sample sent for culture. This completely collapsed the seroma. Catheter secured with Prolene suture and connected to external suction bulb. Sterile dressing applied. No immediate complication. Patient tolerated the procedure  well. IMPRESSION: Successful ultrasound right mastectomy site seroma drain placement. Sample sent for culture. Electronically Signed   By: Judie Petit.  Shick M.D.   On: 11/12/2022 14:36   [place] CHEST SOFT TISSUE  Result Date: 11/11/2022 CLINICAL DATA:  Bilateral total mastectomy 07/19/2022. Breast swelling. EXAM: ULTRASOUND OF CHEST SOFT TISSUES TECHNIQUE: Ultrasound examination of the chest wall soft tissues was performed in the area of clinical concern. COMPARISON:  None Available. FINDINGS: In the left lateral chest wall, there is a small fluid collection  measuring up to 1 cm. This is at the incision site. This may reflect a small postoperative seroma. In the right lateral chest wall, there is a complex cystic area measuring 9.9 x 9.0 x 3.5 cm. Internal echoes and septations. This could reflect liquified hematoma or abscess. IMPRESSION: Right lateral chest wall fluid collection measuring up to 9.9 cm with internal echoes and septations, likely liquified hematoma or abscess. Left lateral chest wall fluid collection measuring up to 1 cm, likely postoperative seroma. Electronically Signed   By: Charlett Nose M.D.   On: 11/11/2022 21:15   [place] CHEST SOFT TISSUE  Result Date: 11/11/2022 CLINICAL DATA:  Chest swelling EXAM: ULTRASOUND OF CHEST SOFT TISSUES TECHNIQUE: Ultrasound examination of the chest wall soft tissues was performed in the area of clinical concern. COMPARISON:  None Available. FINDINGS: Targeted sonography of right lateral chest is performed in the region of swelling. Complex fluid collection, mostly anechoic but contains thin internal septations, this measures 9.8 x 3.5 by 9 cm. Mild overlying skin thickening. IMPRESSION: Status post mastectomy. Large multi-septated fluid collection within the right lateral chest measuring up to 9.8 cm, corresponding to the area of swelling. This is nonspecific in appearance and could represent postoperative collection, sterility is indeterminate by ultrasound.  Electronically Signed   By: Jasmine Pang M.D.   On: 11/11/2022 20:00   VAS [place] LOWER EXTREMITY VENOUS (DVT)  Result Date: 11/08/2022  Lower Venous DVT Study Patient Name:  [name]  Date of Exam:   11/07/2022 Medical Rec #: [id number]        Accession #:    [id number] Date of Birth: 12-16-1960        Patient Gender: F Patient Age:   39 years Exam Location:  [facility] Procedure:      VAS [place] LOWER EXTREMITY VENOUS (DVT) Referring Phys: Lyda Perone --------------------------------------------------------------------------------  Indications: Swelling, Pain, and Elevated D-Dimer.  Risk Factors: Breast Cancer, status post bilateral mastectomy. On Chemo. Limitations: Edema. Comparison Study: No prior study on file Performing Technologist: Sherren Kerns RVS  Examination Guidelines: A complete evaluation includes B-mode imaging, spectral Doppler, color Doppler, and power Doppler as needed of all accessible portions of each vessel. Bilateral testing is considered an integral part of a complete examination. Limited examinations for reoccurring indications may be performed as noted. The reflux portion of the exam is performed with the patient in reverse Trendelenburg.  +---------+---------------+---------+-----------+---------------+--------------+ RIGHT    CompressibilityPhasicitySpontaneityProperties     Thrombus Aging +---------+---------------+---------+-----------+---------------+--------------+ CFV      Full                               pulsatile                                                                 waveforms                     +---------+---------------+---------+-----------+---------------+--------------+ SFJ      Full                                                             +---------+---------------+---------+-----------+---------------+--------------+  FV Prox  Full                                                              +---------+---------------+---------+-----------+---------------+--------------+ FV Mid   Full                               pulsatile                                                                 waveforms                     +---------+---------------+---------+-----------+---------------+--------------+ FV DistalFull                                                             +---------+---------------+---------+-----------+---------------+--------------+ PFV      Full                                                             +---------+---------------+---------+-----------+---------------+--------------+ POP      Full                               pulsatile                                                                 waveforms                     +---------+---------------+---------+-----------+---------------+--------------+ PTV                                                        Not well                                                                  visualized     +---------+---------------+---------+-----------+---------------+--------------+ PERO     None  Acute          +---------+---------------+---------+-----------+---------------+--------------+   +---------+---------------+---------+-----------+---------------+--------------+ LEFT     CompressibilityPhasicitySpontaneityProperties     Thrombus Aging +---------+---------------+---------+-----------+---------------+--------------+ CFV      Full                               pulsatile                                                                 waveforms                     +---------+---------------+---------+-----------+---------------+--------------+ SFJ      Full                                                             +---------+---------------+---------+-----------+---------------+--------------+ FV  Prox  Full                                                             +---------+---------------+---------+-----------+---------------+--------------+ FV Mid   Full                               pulsatile                                                                 waveforms                     +---------+---------------+---------+-----------+---------------+--------------+ FV DistalFull                                                             +---------+---------------+---------+-----------+---------------+--------------+ PFV      Full                                                             +---------+---------------+---------+-----------+---------------+--------------+ POP                                         pulsatile      patent by  waveforms      color and                                                                 Doppler        +---------+---------------+---------+-----------+---------------+--------------+ PTV                                                        Not well                                                                  visualized     +---------+---------------+---------+-----------+---------------+--------------+ PERO     None                                              Acute          +---------+---------------+---------+-----------+---------------+--------------+     Summary: RIGHT: - Findings consistent with acute deep vein thrombosis involving the right peroneal veins. - Technically difficult and limited exam in the calf, however, the peroneal veins appear dilated, do not fully compress, and are absent of color  LEFT: - Findings consistent with acute deep vein thrombosis involving the left peroneal veins. - Technically difficult and limited exam in the calf, however, the peroneal veins appear dilated, do not fully compress, and are absent of color   *See table(s) above for measurements and observations. Electronically signed by Waverly Ferrari MD on 11/08/2022 at 9:29:33 AM.    Final    US RENAL  Result Date: 11/07/2022 CLINICAL DATA:  Acute kidney injury EXAM: RENAL / URINARY TRACT ULTRASOUND COMPLETE COMPARISON:  Abdominal CT 06/19/2022 FINDINGS: Right Kidney: Not sonographically visualized, severely atrophic and likely nonfunctional by prior CT. Left Kidney: volume: 180 mL. Echogenicity within normal limits. No mass or hydronephrosis visualized. Bladder: Collapsed at time of imaging. Other: Echogenic liver from steatosis, confirmed on prior CT IMPRESSION: 1. Severe right renal atrophy with normal appearing left kidney. 2. Hepatic steatosis. Electronically Signed   By: Tiburcio Pea M.D.   On: 11/07/2022 09:17   DG Chest Portable 1 View  Result Date: 11/06/2022 CLINICAL DATA:  Weakness and confusion. EXAM: PORTABLE CHEST 1 VIEW COMPARISON:  August 12, 2022 FINDINGS: Stable right-sided venous Port-A-Cath positioning is seen. The heart size and mediastinal contours are within normal limits. Mild atelectasis is seen within the bilateral lung bases. There is no evidence of focal consolidation, pleural effusion or pneumothorax. Radiopaque surgical clips are seen overlying the lateral chest wall soft tissues, bilaterally. Multilevel degenerative changes are noted throughout the thoracic spine. IMPRESSION: Mild bibasilar atelectasis. Electronically Signed   By: Aram Candela M.D.   On: 11/06/2022 23:08    Labs:  CBC: Recent Labs    11/11/22 0306 11/12/22 0218 11/13/22 0340 11/19/22 0756  WBC 16.3* 19.4* 22.6* 8.0  HGB 9.3* 9.5* 9.2* 10.7*  HCT 29.9* 31.1* 29.9* 34.7*  PLT 427* 406* 371 210    COAGS: Recent Labs    06/18/22 2139 11/06/22 2018 11/07/22 0202  INR 1.1 1.1 1.2  APTT 27 30  --     BMP: Recent Labs    11/11/22 0306 11/12/22 0218 11/13/22 0340 11/19/22 0756  NA 136 135 136 137  K 3.8 3.3* 4.0 4.5  CL  99 100 101 101  CO2 25 24 22 26   GLUCOSE 116* 122* 114* 173*  BUN 13 13 14  25*  CALCIUM 8.9 8.9 8.7* 9.5  CREATININE 1.35* 1.19* 1.13* 1.19*  GFRNONAA 44* 51* 54* 51*    LIVER FUNCTION TESTS: Recent Labs    11/11/22 0306 11/12/22 0218 11/13/22 0340 11/19/22 0756  BILITOT 0.7 0.6 0.7 0.3  AST 19 18 20 16   ALT 14 14 13 16   ALKPHOS 90 96 76 75  PROT 6.0* 6.1* 6.1* 6.9  ALBUMIN 2.9* 3.0* 3.1* 3.7    TUMOR MARKERS: No results for input(s): "AFPTM", "CEA", "CA199", "CHROMGRNA" in the last 8760 hours.  Assessment and Plan:  [name] is a 62 y.o. female with past medical history significant for hypertension, diabetes, hyperlipidemia and breast cancer, who underwent placement of an image guided right breast/chest wall seroma drainage catheter placement on 11/12/2022, subsequently exchanged and upsized on 11/26/2022.  Given persistent output of approximately 30 cc/day, the decision was made to maintain the drainage catheter at this time.    The patient was instructed to no longer flush the drainage catheter and to maintain diligent records regarding daily drainage catheter output.    Once output is less than 10 cc/day for 2 consecutive days, the patient may call the interventional radiology clinic to have the drainage catheter removed.  Patient states that she is to undergo portacatheter removal by Dr. Carolynne Edouard, who may also remove the drainage catheter at that time as indicated.  The patient knows to call the interventional radiology drain clinic with any interval questions or concerns.  PLAN: - Do not flush drainage catheter and maintain diligent records regarding daily drainage catheter output. - If not removed by Dr. Carolynne Edouard at the time of portacatheter removal, the patient knows to call the interventional radiology drain clinic for drain removal once output from the drainage catheter is less than 10 cc for 2 consecutive days.  A copy of this report was sent to the requesting  provider on this date.  Electronically Signed: Simonne Come 12/06/2022, 1:38 PM   I spent a total of 10 Minutes in face to face in clinical consultation, greater than 50% of which was counseling/coordinating care for drainage catheter evaluation and management

## 2022-12-08 ENCOUNTER — Encounter: Payer: Self-pay | Admitting: Hematology and Oncology

## 2022-12-10 ENCOUNTER — Ambulatory Visit: Payer: BC Managed Care – PPO

## 2022-12-10 ENCOUNTER — Ambulatory Visit: Payer: BC Managed Care – PPO | Admitting: Adult Health

## 2022-12-10 ENCOUNTER — Other Ambulatory Visit: Payer: BC Managed Care – PPO

## 2022-12-13 ENCOUNTER — Encounter: Payer: Self-pay | Admitting: *Deleted

## 2022-12-13 ENCOUNTER — Other Ambulatory Visit (HOSPITAL_COMMUNITY): Payer: Self-pay

## 2022-12-15 ENCOUNTER — Other Ambulatory Visit (HOSPITAL_COMMUNITY): Payer: Self-pay

## 2022-12-20 ENCOUNTER — Telehealth: Payer: Self-pay

## 2022-12-20 ENCOUNTER — Other Ambulatory Visit: Payer: Self-pay | Admitting: General Surgery

## 2022-12-20 DIAGNOSIS — N6489 Other specified disorders of breast: Secondary | ICD-10-CM

## 2022-12-20 MED ORDER — APIXABAN 5 MG PO TABS: 5.0000 mg | ORAL_TABLET | Freq: Two times a day (BID) | ORAL | 5 refills | Status: DC

## 2022-12-20 NOTE — Telephone Encounter (Signed)
Pt called and states she needs a refill on eliquis, but that it is $800 and right now she does not have insurance. Pt was advised to try signing up for [place] [facility] online. She will call us back in the event she is not eligible for assistance.

## 2022-12-22 ENCOUNTER — Ambulatory Visit
Admission: RE | Admit: 2022-12-22 | Discharge: 2022-12-22 | Disposition: A | Discharge status: Home / Self Care | Payer: PRIVATE HEALTH INSURANCE | Source: Ambulatory Visit | Attending: General Surgery | Admitting: General Surgery

## 2022-12-22 ENCOUNTER — Encounter: Payer: Self-pay | Admitting: Hematology and Oncology

## 2022-12-22 DIAGNOSIS — N6489 Other specified disorders of breast: Secondary | ICD-10-CM

## 2022-12-22 NOTE — Progress Notes (Addendum)
Chief Complaint: Patient was seen in consultation today for drain follow up.  Referring Physician(s): Toth,Paul III  History of Present Illness: [name] is a 62 y.o. female with history of breast cancer, who underwent placement of an image guided right breast/chest wall seroma drainage catheter placement on 11/12/2022, subsequently exchanged and upsized on 11/26/2022.   Patient was previously seen in IR clinic on 12/06/2022.  At that time, the patient was having approximately 30 mL of output per day.  Currently, the patient has almost no output.  She had a few milliliters in the bulb over the last 2 days.  Patient notes new redness along the medial aspect of the right chest at the mastectomy site.  The redness is not associated with the drain site.  She denies fevers or chills.  No significant pain at this time.  She is scheduled to have her port removed in August.   Past Medical History:  Diagnosis Date   Anemia    Arthritis    Asthma    Bladder spasms    Breast cancer (HCC)    Diabetes mellitus without complication (HCC)    GERD (gastroesophageal reflux disease)    Heart murmur    Hyperlipidemia    Hypertension     Past Surgical History:  Procedure Laterality Date   BACK SURGERY  1989, 1990, 2006   BREAST BIOPSY Left 05/14/2022   [place] LT BREAST BX W LOC DEV 1ST LESION IMG BX SPEC US GUIDE 05/14/2022 [facility]   BREAST BIOPSY Right 05/14/2022   MM RT BREAST BX W LOC DEV 1ST LESION IMAGE BX SPEC STEREO GUIDE 05/14/2022 [facility]   BREAST BIOPSY Left 05/14/2022   MM LT BREAST BX W LOC DEV 1ST LESION IMAGE BX SPEC STEREO GUIDE 05/14/2022 [facility]   BREAST BIOPSY Right 05/26/2022   MM RT BREAST BX W LOC DEV 1ST LESION IMAGE BX SPEC STEREO GUIDE 05/26/2022 [facility]   BREAST BIOPSY Right 05/26/2022   MM RT BREAST BX W LOC DEV EA AD LESION IMG BX SPEC STEREO GUIDE 05/26/2022 [facility]   BREAST LUMPECTOMY  06/14/1974   bi-lat ,  both benign    CHOLECYSTECTOMY  06/14/1996   IR CATHETER TUBE CHANGE  11/26/2022   IR US GUIDANCE  11/26/2022   IR US GUIDE BX ASP/DRAIN  11/12/2022   LEG SURGERY  06/15/2007   work related injury   PARTIAL HYSTERECTOMY     still has ovaries   PORTACATH PLACEMENT Right 08/12/2022   Procedure: INSERTION PORT-A-CATH;  Surgeon: Griselda Miner, MD;  Location: [facility];  Service: General;  Laterality: Right;   SENTINEL NODE BIOPSY Bilateral 07/19/2022   Procedure: BILATERAL SENTINEL NODE BIOPSY;  Surgeon: Griselda Miner, MD;  Location: [facility];  Service: General;  Laterality: Bilateral;   TOTAL MASTECTOMY Bilateral 07/19/2022   Procedure: BILATERAL TOTAL MASTECTOMY;  Surgeon: Griselda Miner, MD;  Location: [facility];  Service: General;  Laterality: Bilateral;    Allergies: Codeine phosphate, Lipitor [atorvastatin], Simvastatin, Sulfamethoxazole, and Tape  Medications: Prior to Admission medications   Medication Sig Start Date End Date Taking? Authorizing Provider  albuterol (VENTOLIN HFA) 108 (90 Base) MCG/ACT inhaler Inhale 2 puffs into the lungs every 6 (six) hours as needed for wheezing or shortness of breath. 07/16/20   Ghimire, Werner Lean, MD  ALPRAZolam Prudy Feeler) 0.5 MG tablet Take 0.5 mg by mouth 2 (two) times daily as needed for sleep or anxiety. 02/20/15   [provider]  amitriptyline (ELAVIL) 50 MG tablet Take 50 mg by mouth at bedtime.    [provider]  amLODipine (NORVASC) 10 MG tablet Take 10 mg by mouth daily. 05/05/20   [provider]  apixaban (ELIQUIS) 5 MG TABS tablet Take 1 tablet (5 mg total) by mouth 2 (two) times daily. 12/20/22 06/18/23  Serena Croissant, MD  ezetimibe (ZETIA) 10 MG tablet Take 10 mg by mouth daily.    [provider]  LIQUID ALLERGY RELIEF 12.5 MG/5ML liquid Take by mouth. 09/28/22   [provider]  loratadine (CLARITIN) 10 MG tablet Take 10 mg by mouth daily.    [provider]  magic mouthwash w/lidocaine SOLN Take 5  mLs by mouth 4 (four) times daily as needed for mouth pain. 09/16/22   Serena Croissant, MD  metFORMIN (GLUCOPHAGE-XR) 500 MG 24 hr tablet Take 1,000 mg by mouth 2 (two) times daily. 06/09/20   [provider]  MOUNJARO 5 MG/0.5ML Pen Inject 5 mg into the skin every Monday. 11/01/22   [provider]  oxyCODONE ER (XTAMPZA ER) 36 MG C12A Take 36 mg by mouth in the morning and at bedtime.    [provider]  oxyCODONE ER (XTAMPZA ER) 36 MG C12A Take 1 capsule (36 mg total) by mouth every 12 (twelve) hours. 12/01/22     oxyCODONE ER (XTAMPZA ER) 36 MG C12A Take 1 capsule (36 mg total) by mouth every 12 (twelve) hours. 12/29/22     pantoprazole (PROTONIX) 40 MG tablet Take 40 mg by mouth daily.    [provider]  Pediatric Multivitamins-Iron (FLINTSTONES PLUS EXTRA IRON PO) Take 1 tablet by mouth daily.    [provider]  pregabalin (LYRICA) 100 MG capsule Take 100 mg by mouth 2 (two) times daily. 11/14/22   [provider]  pregabalin (LYRICA) 100 MG capsule Take 1 capsule (100 mg total) by mouth 2 (two) times daily. 12/01/22     promethazine (PHENERGAN) 25 MG tablet TAKE 1 TABLET BY MOUTH EVERY 6 HOURS AS NEEDED FOR NAUSEA OR VOMITING. 10/18/22   Serena Croissant, MD  sodium chloride flush 0.9 % SOLN injection Flush the drain with 5 mL every day 11/24/22   Villa Herb, PA-C     Family History  Problem Relation Age of Onset   Diabetes Mother    Hypertension Mother    Heart disease Mother    Hypertension Father    Diabetes Father    Heart disease Father    Breast cancer Sister 30   Lymphoma Paternal Grandfather        dx after 72   Neuropathy Neg Hx     Social History   Socioeconomic History   Marital status: Married    Spouse name: Ricky    Number of children: 2   Years of education: 9   Highest education level: Not on file  Occupational History   Occupation: Environmental manager  Tobacco Use   Smoking status: Former    Packs/day: 0.50     Years: 37.00    Additional pack years: 0.00    Total pack years: 18.50    Types: Cigarettes    Quit date: 06/14/2013    Years since quitting: 9.5   Smokeless tobacco: Never  Vaping Use   Vaping Use: Never used  Substance and Sexual Activity   Alcohol use: No   Drug use: No   Sexual activity: Never    Comment: 62 YEARS OLD, NO MORE  THAN 5 PARTNERS  Other Topics Concern   Not on file  Social History Narrative   Lives at home with husband   Caffeine use: none   Social Determinants of Health   Financial Resource Strain: Not on file  Food Insecurity: No Food Insecurity (06/19/2022)   Hunger Vital Sign    Worried About Running Out of Food in the Last Year: Never true    Ran Out of Food in the Last Year: Never true  Transportation Needs: No Transportation Needs (09/06/2022)   PRAPARE - Administrator, Civil Service (Medical): No    Lack of Transportation (Non-Medical): No  Physical Activity: Not on file  Stress: Not on file  Social Connections: Not on file      Review of Systems  Constitutional: Negative.  Negative for fever.  Musculoskeletal:        Redness in the medial right chest.      Vital Signs: BP (!) 144/99   Pulse 87   Temp 98 F (36.7 C)   Resp 18   LMP 04/22/1997   SpO2 99%     Physical Exam Constitutional:      Appearance: She is not ill-appearing.  Pulmonary:     Effort: Pulmonary effort is normal.  Musculoskeletal:     Comments: Right lateral chest drain was intact.  No redness around the drain.  Minimal fluid in the suction bulb.   Redness along the medial right chest.  Area is slightly warm to touch.  No ulcerations or drainage in this area.    Neurological:     Mental Status: She is alert.           Imaging: IR Catheter Tube Change  Result Date: 11/26/2022 INDICATION: 62 year old female with a history of breast seroma and indwelling seroma drain malfunction. She presents for exchange EXAM: IMAGE GUIDED BREAST INJECTION  AND EXCHANGE/UPSIZE LIMITED ULTRASOUND OF THE SOFT TISSUES CHEST MEDICATIONS: None ANESTHESIA/SEDATION: None COMPLICATIONS: None PROCEDURE: Informed written consent was obtained from the patient after a thorough discussion of the procedural risks, benefits and alternatives. All questions were addressed. Maximal Sterile Barrier Technique was utilized including caps, mask, sterile gowns, sterile gloves, sterile drape, hand hygiene and skin antiseptic. A timeout was performed prior to the initiation of the procedure. Patient was position supine under the image intensifier. The right breast in the indwelling drain were prepped and draped in the usual sterile fashion. Limited ultrasound with grayscale performed to identify any residual fluid pockets given the absence of drainage from the drain. A sizable some fluid collection remains within the seroma cavity and thus we proceeded with exchange. 1% lidocaine was used for local anesthesia. Contrast was injected confirming location of the residual seroma. Modified Seldinger technique was attempted, with the wire meeting resistance. Soft tissue blockage of the drain tract precluded a standard exchange. The drain was removed and a Kumpe the catheter was navigated through the drain tract into the seroma cavity. Contrast confirmed location. Wire was placed and then a new 12 [place] skater type pigtail drainage catheter was advanced into the cavity. Aspiration of residual seroma was performed. Drain was sutured in position and attached to bulb suction. Final images were stored. Patient tolerated the procedure well and remained hemodynamically stable throughout. No complications were encountered and no significant blood loss. IMPRESSION: Ultrasound demonstrates residual seroma cavity on the right chest wall. Status post image guided exchange of seroma drain for a new 12 French seroma drain to bulb suction. Signed, Marijean Niemann  Kenna Gilbert, DO, ABVM, RPVI [facility] Radiology Electronically Signed   By: Gilmer Mor D.O.   On: 11/26/2022 09:18   IR US Guidance  Result Date: 11/26/2022 INDICATION: 62 year old female with a history of breast seroma and indwelling seroma drain malfunction. She presents for exchange EXAM: IMAGE GUIDED BREAST INJECTION AND EXCHANGE/UPSIZE LIMITED ULTRASOUND OF THE SOFT TISSUES CHEST MEDICATIONS: None ANESTHESIA/SEDATION: None COMPLICATIONS: None PROCEDURE: Informed written consent was obtained from the patient after a thorough discussion of the procedural risks, benefits and alternatives. All questions were addressed. Maximal Sterile Barrier Technique was utilized including caps, mask, sterile gowns, sterile gloves, sterile drape, hand hygiene and skin antiseptic. A timeout was performed prior to the initiation of the procedure. Patient was position supine under the image intensifier. The right breast in the indwelling drain were prepped and draped in the usual sterile fashion. Limited ultrasound with grayscale performed to identify any residual fluid pockets given the absence of drainage from the drain. A sizable some fluid collection remains within the seroma cavity and thus we proceeded with exchange. 1% lidocaine was used for local anesthesia. Contrast was injected confirming location of the residual seroma. Modified Seldinger technique was attempted, with the wire meeting resistance. Soft tissue blockage of the drain tract precluded a standard exchange. The drain was removed and a Kumpe the catheter was navigated through the drain tract into the seroma cavity. Contrast confirmed location. Wire was placed and then a new 12 [place] skater type pigtail drainage catheter was advanced into the cavity. Aspiration of residual seroma was performed. Drain was sutured in position and attached to bulb suction. Final images were stored. Patient tolerated the procedure well and remained hemodynamically stable throughout.  No complications were encountered and no significant blood loss. IMPRESSION: Ultrasound demonstrates residual seroma cavity on the right chest wall. Status post image guided exchange of seroma drain for a new 12 French seroma drain to bulb suction. Signed, Yvone Neu. Miachel Roux, RPVI [facility] Inter[facility] Radiology Electronically Signed   By: Gilmer Mor D.O.   On: 11/26/2022 09:18    Labs:  CBC: Recent Labs    11/11/22 0306 11/12/22 0218 11/13/22 0340 11/19/22 0756  WBC 16.3* 19.4* 22.6* 8.0  HGB 9.3* 9.5* 9.2* 10.7*  HCT 29.9* 31.1* 29.9* 34.7*  PLT 427* 406* 371 210    COAGS: Recent Labs    06/18/22 2139 11/06/22 2018 11/07/22 0202  INR 1.1 1.1 1.2  APTT 27 30  --     BMP: Recent Labs    11/11/22 0306 11/12/22 0218 11/13/22 0340 11/19/22 0756  NA 136 135 136 137  K 3.8 3.3* 4.0 4.5  CL 99 100 101 101  CO2 25 24 22 26   GLUCOSE 116* 122* 114* 173*  BUN 13 13 14  25*  CALCIUM 8.9 8.9 8.7* 9.5  CREATININE 1.35* 1.19* 1.13* 1.19*  GFRNONAA 44* 51* 54* 51*    LIVER FUNCTION TESTS: Recent Labs    11/11/22 0306 11/12/22 0218 11/13/22 0340 11/19/22 0756  BILITOT 0.7 0.6 0.7 0.3  AST 19 18 20 16   ALT 14 14 13 16   ALKPHOS 90 96 76 75  PROT 6.0* 6.1* 6.1* 6.9  ALBUMIN 2.9* 3.0* 3.1* 3.7    TUMOR MARKERS: No results for input(s): "AFPTM", "CEA", "CA199", "CHROMGRNA" in the last 8760 hours.  Assessment and Plan:  62 year old with history of postoperative right chest wall seroma.  The seroma has been managed with a percutaneous  drain that was upsized on 11/26/2022.  Patient has minimal output from the drain at this time.  As result, the drain was removed.  Bandage placed at the old drain site.  Patient was informed there may be residual drainage at the site for a few days.  New redness along the medial aspect of the right chest.  I took an image of this area.  Patient says the redness started approximately 1 week ago  and about the same time that the drain stopped having output.  Not clear if the redness is associated with the drain.  Now that the drain is removed, I told the patient to continue to watch the area.  If she feels like it is getting worse or changing then she should follow-up with Dr. Carolynne Edouard.   Electronically Signed: Arn Medal 12/22/2022, 11:51 AM   I spent a total of    15 Minutes in face to face in clinical consultation, greater than 50% of which was counseling/coordinating care for drain care.  Patient ID: [name], female   DOB: 07/02/1960, 62 y.o.   MRN: [id number]

## 2022-12-22 NOTE — Progress Notes (Signed)
Received [name] [employer] Form for Eliquis rx in mailbox. Pt completed their portion of form. Form signed by MD and faxed to [phone] with receipt confirmation. Form scanned under media. Pt notified and verbalized understanding.

## 2022-12-27 ENCOUNTER — Ambulatory Visit: Payer: Self-pay

## 2022-12-27 NOTE — Patient Outreach (Signed)
  Care Coordination   Follow Up Visit Note   12/27/2022 Name: [name] MRN: [id number] DOB: 11-24-1960  [name] is a 62 y.o. year old female who sees Larose Hires, Gaylyn Lambert, FNP for primary care. I spoke with  [name] by phone today.  What matters to the patients health and wellness today?  Beating breast cancer    Goals Addressed             This Visit's Progress    Beating Breast Cancer       Patient Goals/Self Care Activities: -Patient/Caregiver will self-administer medications as prescribed as evidenced by self-report/primary caregiver report  -Patient/Caregiver will attend all scheduled provider appointments as evidenced by clinician review of documented attendance to scheduled appointments and patient/caregiver report -Patient/Caregiver will call provider office for new concerns or questions as evidenced by review of documented incoming telephone call notes and patient report  -check blood sugar at prescribed times -record values and write them down take them to all doctor visits    -Patient chemo discontinued.  Drains are out. Porta cath to be taken out on August 2nd.  Radiation on hold pending porta cath removal.     Patient reports blood sugars up and down. Depending on what she eats. Encouraged diabetes mangement.         SDOH assessments and interventions completed:  Yes     Care Coordination Interventions:  Yes, provided   Follow up plan: Follow up call scheduled for August    Encounter Outcome:  Pt. Visit Completed   Bary Leriche, RN, MSN [facility] Care Management Care Management Coordinator Direct Line [phone]

## 2022-12-27 NOTE — Patient Instructions (Signed)
Visit Information  Thank you for taking time to visit with me today. Please don't hesitate to contact me if I can be of assistance to you.   Following are the goals we discussed today:   Goals Addressed             This Visit's Progress    Beating Breast Cancer       Patient Goals/Self Care Activities: -Patient/Caregiver will self-administer medications as prescribed as evidenced by self-report/primary caregiver report  -Patient/Caregiver will attend all scheduled provider appointments as evidenced by clinician review of documented attendance to scheduled appointments and patient/caregiver report -Patient/Caregiver will call provider office for new concerns or questions as evidenced by review of documented incoming telephone call notes and patient report  -check blood sugar at prescribed times -record values and write them down take them to all doctor visits    -Patient chemo discontinued.  Drains are out. Porta cath to be taken out on August 2nd.  Radiation on hold pending porta cath removal.     Patient reports blood sugars up and down. Depending on what she eats. Encouraged diabetes mangement.         Our next appointment is by telephone on 01/24/23 at 1000 am  Please call the care guide team at [phone] if you need to cancel or reschedule your appointment.   If you are experiencing a Mental Health or Behavioral Health Crisis or need someone to talk to, please call the Suicide and Crisis Lifeline: 988   Patient verbalizes understanding of instructions and care plan provided today and agrees to view in MyChart. Active MyChart status and patient understanding of how to access instructions and care plan via MyChart confirmed with patient.     The patient has been provided with contact information for the care management team and has been advised to call with any health related questions or concerns.   Bary Leriche, RN, MSN [facility] Care Management Care Management  Coordinator Direct Line [phone]

## 2022-12-31 ENCOUNTER — Encounter: Payer: Self-pay | Admitting: Hematology and Oncology

## 2023-01-03 ENCOUNTER — Telehealth: Payer: Self-pay

## 2023-01-03 ENCOUNTER — Other Ambulatory Visit (HOSPITAL_COMMUNITY): Payer: Self-pay

## 2023-01-03 NOTE — Telephone Encounter (Signed)
Rn called pt to make sure 8am on 8-12 was a good time for CT Sim for pt. Pt stated that it was. She stated her redness remains though part of the issue she is having is the bra they provided her with does not support her well at times. She states the redness is improving and she would like Dr. Basilio Cairo to look at her breast before she has her [facility] as well. Pt to have port taken out on August 2nd. This will allow recovery time prior to the start of radiation.

## 2023-01-03 NOTE — Telephone Encounter (Signed)
RN called Dr. Billey Chang office per Dr. Basilio Cairo request to ensure that Dr. Carolynne Edouard is able to access Epic messages. She was concerned when she had not heard back from Dr. Carolynne Edouard since message was sent early last week with no response. Martie Lee stated she would check on this and get him the message as well.

## 2023-01-07 ENCOUNTER — Other Ambulatory Visit: Payer: Self-pay

## 2023-01-07 ENCOUNTER — Encounter (HOSPITAL_BASED_OUTPATIENT_CLINIC_OR_DEPARTMENT_OTHER): Payer: Self-pay | Admitting: General Surgery

## 2023-01-07 ENCOUNTER — Encounter: Payer: Self-pay | Admitting: Hematology and Oncology

## 2023-01-07 NOTE — Progress Notes (Signed)
   01/07/23 1444  PAT Phone Screen  Is the patient taking a GLP-1 receptor agonist? (S)  Yes (not currently taking)  Has the patient been informed on holding medication? Yes  Do You Have Diabetes? Yes  Do You Have Hypertension? Yes  Have You Ever Been to the ER for Asthma? No  Have You Taken Oral Steroids in the Past 3 Months? No  Do you Take Phenteramine or any Other Diet Drugs? No  Recent  Lab Work, EKG, CXR? Yes  Where was this test performed? in chart  Do you have a history of heart problems? Yes (murmur)  Have you ever had tests on your heart? Yes  What cardiac tests were performed? EKG  What date/year were cardiac tests completed? 11/11/22  Results viewable: Care Everywhere  Any Recent Hospitalizations? Yes  Height 5\' 6"  (1.676 m)  Weight 92.1 kg  Pat Appointment Scheduled Yes

## 2023-01-07 NOTE — Progress Notes (Signed)
   01/07/23 1444  Pre-op Phone Call  Surgery Date Verified 01/14/23  Arrival Time Verified 0715  Surgery Location Verified [facility] [place]  Is the patient taking a GLP-1 receptor agonist? (S)  Yes  Has the patient been informed on holding medication? Yes  Does the patient have diabetes? Type II  Does the patient use a Continuous Blood Glucose Monitor? No  Is the patient on an insulin pump? No  Has the diabetes coordinator been notified? No  Do you have a history of heart problems? Yes (murmur)  Have you ever had tests on your heart? Yes  What cardiac tests were performed? EKG  What date/year were cardiac tests completed? 11/11/22  Results viewable: Care Everywhere  Does patient have other implanted devices? No (Rod in leg)  Patient Teaching Enhanced Recovery;Pre / Post Procedure  Patient educated about smoking cessation 24 hours prior to surgery. N/A Non-Smoker  Patient verbalizes understanding of bowel prep? N/A  THA/TKA patients only:  By your surgery date, will you have been taking narcotics for 90 days or greater? No  Med Rec Completed Yes  Take the Following Meds the Morning of Surgery Noravasc, claritin, zetia, lyrica,  Recent  Lab Work, EKG, CXR? Yes  NPO (Including gum & candy) After midnight  Allowed clear liquids Water;Gatorade  (diabetics please choose diet or no sugar options)  Patient instructed to stop clear liquids including Carb loading drink at: 0445  Stop Solids, Milk, Candy, and Gum STARTING AT MIDNIGHT  Responsible adult to drive and be with you for 24 hours? Yes  Name & Phone Number for Ride/Caregiver husband  No Jewelry, money, nail polish or make-up.  No lotions, powders, perfumes. No shaving  48 hrs. prior to surgery. Yes  Contacts, Dentures & Glasses Will Have to be Removed Before OR. Yes  Please bring your ID and Insurance Card the morning of your surgery. (Surgery Centers Only) Yes  Bring any papers or x-rays with you that your surgeon gave you. Yes  Instructed to  contact the location of procedure/ provider if they or anyone in their household develops symptoms or tests positive for COVID-19, has close contact with someone who tests positive for COVID, or has known exposure to any contagious illness. Yes  Call this number the morning of surgery  with any problems that may cancel your surgery. [phone]  Covid-19 Assessment  Have you had a positive COVID-19 test within the previous 90 days? No  COVID Testing Guidance Proceed with the additional questions.  Patient's surgery required a COVID-19 test (cardiothoracic, complex ENT, and bronchoscopies/ EBUS) No  Have you been unmasked and in close contact with anyone with COVID-19 or COVID-19 symptoms within the past 10 days? No  Do you or anyone in your household currently have any COVID-19 symptoms? No

## 2023-01-10 ENCOUNTER — Encounter (HOSPITAL_BASED_OUTPATIENT_CLINIC_OR_DEPARTMENT_OTHER)
Admission: RE | Admit: 2023-01-10 | Discharge: 2023-01-10 | Disposition: A | Discharge status: Home / Self Care | Payer: PRIVATE HEALTH INSURANCE | Source: Ambulatory Visit | Attending: General Surgery | Admitting: General Surgery

## 2023-01-10 DIAGNOSIS — Z01812 Encounter for preprocedural laboratory examination: Secondary | ICD-10-CM | POA: Insufficient documentation

## 2023-01-10 LAB — BASIC METABOLIC PANEL
Anion gap: 12 (ref 5–15)
BUN: 20 mg/dL (ref 8–23)
CO2: 25 mmol/L (ref 22–32)
Calcium: 9.5 mg/dL (ref 8.9–10.3)
Chloride: 97 mmol/L — ABNORMAL LOW (ref 98–111)
Creatinine, Ser: 1.1 mg/dL — ABNORMAL HIGH (ref 0.44–1.00)
GFR, Estimated: 56 mL/min — ABNORMAL LOW (ref >60)
Glucose, Bld: 344 mg/dL — ABNORMAL HIGH (ref 70–99)
Potassium: 4.8 mmol/L (ref 3.5–5.1)
Sodium: 134 mmol/L — ABNORMAL LOW (ref 135–145)

## 2023-01-10 NOTE — Progress Notes (Signed)

## 2023-01-11 NOTE — Progress Notes (Signed)
Neptune.Screws- Dr. Tacy Dura aware, will recheck day of surgery.

## 2023-01-13 ENCOUNTER — Encounter: Payer: Self-pay | Admitting: Hematology and Oncology

## 2023-01-13 ENCOUNTER — Other Ambulatory Visit (HOSPITAL_COMMUNITY): Payer: Self-pay

## 2023-01-13 ENCOUNTER — Other Ambulatory Visit: Payer: Self-pay

## 2023-01-14 ENCOUNTER — Ambulatory Visit (HOSPITAL_BASED_OUTPATIENT_CLINIC_OR_DEPARTMENT_OTHER): Payer: Commercial Managed Care - HMO | Admitting: Anesthesiology

## 2023-01-14 ENCOUNTER — Ambulatory Visit (HOSPITAL_BASED_OUTPATIENT_CLINIC_OR_DEPARTMENT_OTHER)
Admission: RE | Admit: 2023-01-14 | Discharge: 2023-01-14 | Disposition: A | Discharge status: Home / Self Care | Payer: Commercial Managed Care - HMO | Source: Ambulatory Visit | Attending: General Surgery | Admitting: General Surgery

## 2023-01-14 ENCOUNTER — Encounter (HOSPITAL_BASED_OUTPATIENT_CLINIC_OR_DEPARTMENT_OTHER): Payer: Self-pay | Admitting: General Surgery

## 2023-01-14 ENCOUNTER — Encounter (HOSPITAL_BASED_OUTPATIENT_CLINIC_OR_DEPARTMENT_OTHER)
Admission: RE | Disposition: A | Discharge status: Home / Self Care | Payer: Self-pay | Source: Ambulatory Visit | Attending: General Surgery

## 2023-01-14 ENCOUNTER — Other Ambulatory Visit: Payer: Self-pay

## 2023-01-14 DIAGNOSIS — Z87891 Personal history of nicotine dependence: Secondary | ICD-10-CM

## 2023-01-14 DIAGNOSIS — K219 Gastro-esophageal reflux disease without esophagitis: Secondary | ICD-10-CM | POA: Insufficient documentation

## 2023-01-14 DIAGNOSIS — Z9013 Acquired absence of bilateral breasts and nipples: Secondary | ICD-10-CM | POA: Insufficient documentation

## 2023-01-14 DIAGNOSIS — Z7985 Long-term (current) use of injectable non-insulin antidiabetic drugs: Secondary | ICD-10-CM | POA: Diagnosis not present

## 2023-01-14 DIAGNOSIS — Z853 Personal history of malignant neoplasm of breast: Secondary | ICD-10-CM | POA: Insufficient documentation

## 2023-01-14 DIAGNOSIS — C50812 Malignant neoplasm of overlapping sites of left female breast: Secondary | ICD-10-CM

## 2023-01-14 DIAGNOSIS — I10 Essential (primary) hypertension: Secondary | ICD-10-CM | POA: Diagnosis not present

## 2023-01-14 DIAGNOSIS — E119 Type 2 diabetes mellitus without complications: Secondary | ICD-10-CM | POA: Insufficient documentation

## 2023-01-14 DIAGNOSIS — Z170 Estrogen receptor positive status [ER+]: Secondary | ICD-10-CM | POA: Diagnosis not present

## 2023-01-14 DIAGNOSIS — Z01818 Encounter for other preprocedural examination: Secondary | ICD-10-CM

## 2023-01-14 DIAGNOSIS — J45909 Unspecified asthma, uncomplicated: Secondary | ICD-10-CM | POA: Diagnosis not present

## 2023-01-14 DIAGNOSIS — Z452 Encounter for adjustment and management of vascular access device: Secondary | ICD-10-CM | POA: Diagnosis present

## 2023-01-14 HISTORY — PX: PORT-A-CATH REMOVAL: SHX5289

## 2023-01-14 LAB — GLUCOSE, CAPILLARY
Glucose-Capillary: 222 mg/dL — ABNORMAL HIGH (ref 70–99)
Glucose-Capillary: 228 mg/dL — ABNORMAL HIGH (ref 70–99)

## 2023-01-14 SURGERY — REMOVAL PORT-A-CATH
Anesthesia: Monitor Anesthesia Care | Site: Chest | Laterality: Right

## 2023-01-14 MED ORDER — LIDOCAINE HCL 1 % IJ SOLN
INTRAMUSCULAR | Status: DC | PRN
  Administered 2023-01-14: 5 mL

## 2023-01-14 MED ORDER — PHENYLEPHRINE HCL (PRESSORS) 10 MG/ML IV SOLN
INTRAVENOUS | Status: DC | PRN
  Administered 2023-01-14: 160 ug via INTRAVENOUS
  Administered 2023-01-14 (×2): 80 ug via INTRAVENOUS

## 2023-01-14 MED ORDER — FENTANYL CITRATE (PF) 100 MCG/2ML IJ SOLN
INTRAMUSCULAR | Status: AC
  Filled 2023-01-14: qty 2

## 2023-01-14 MED ORDER — PROPOFOL 10 MG/ML IV BOLUS
INTRAVENOUS | Status: DC | PRN
Start: 2023-01-14 — End: 2023-01-14
  Administered 2023-01-14: 20 mg via INTRAVENOUS

## 2023-01-14 MED ORDER — FENTANYL CITRATE (PF) 100 MCG/2ML IJ SOLN: 25.0000 ug | INTRAMUSCULAR | Status: DC | PRN

## 2023-01-14 MED ORDER — CHLORHEXIDINE GLUCONATE CLOTH 2 % EX PADS: 6.0000 | MEDICATED_PAD | Freq: Once | CUTANEOUS | Status: DC

## 2023-01-14 MED ORDER — FENTANYL CITRATE (PF) 100 MCG/2ML IJ SOLN
INTRAMUSCULAR | Status: DC | PRN
  Administered 2023-01-14 (×2): 50 ug via INTRAVENOUS

## 2023-01-14 MED ORDER — PROPOFOL 500 MG/50ML IV EMUL
INTRAVENOUS | Status: DC | PRN
  Administered 2023-01-14: 100 ug/kg/min via INTRAVENOUS

## 2023-01-14 MED ORDER — MIDAZOLAM HCL 2 MG/2ML IJ SOLN
INTRAMUSCULAR | Status: AC
  Filled 2023-01-14: qty 2

## 2023-01-14 MED ORDER — LACTATED RINGERS IV SOLN: INTRAVENOUS | Status: DC

## 2023-01-14 MED ORDER — MIDAZOLAM HCL 5 MG/5ML IJ SOLN
INTRAMUSCULAR | Status: DC | PRN
  Administered 2023-01-14: 2 mg via INTRAVENOUS

## 2023-01-14 MED ORDER — ONDANSETRON HCL 4 MG/2ML IJ SOLN
INTRAMUSCULAR | Status: DC | PRN
  Administered 2023-01-14: 4 mg via INTRAVENOUS

## 2023-01-14 MED ORDER — LIDOCAINE HCL (CARDIAC) PF 100 MG/5ML IV SOSY
PREFILLED_SYRINGE | INTRAVENOUS | Status: DC | PRN
  Administered 2023-01-14: 50 mg via INTRAVENOUS

## 2023-01-14 MED ORDER — PHENYLEPHRINE 80 MCG/ML (10ML) SYRINGE FOR IV PUSH (FOR BLOOD PRESSURE SUPPORT)
PREFILLED_SYRINGE | INTRAVENOUS | Status: AC
  Filled 2023-01-14: qty 10

## 2023-01-14 MED ORDER — LIDOCAINE 2% (20 MG/ML) 5 ML SYRINGE
INTRAMUSCULAR | Status: AC
  Filled 2023-01-14: qty 5

## 2023-01-14 MED ORDER — OXYCODONE HCL 5 MG PO TABS: 5.0000 mg | ORAL_TABLET | Freq: Once | ORAL | Status: DC | PRN

## 2023-01-14 MED ORDER — OXYCODONE HCL 5 MG PO TABS: 5.0000 mg | ORAL_TABLET | Freq: Four times a day (QID) | ORAL | 0 refills | Status: DC | PRN

## 2023-01-14 MED ORDER — ONDANSETRON HCL 4 MG/2ML IJ SOLN
INTRAMUSCULAR | Status: AC
  Filled 2023-01-14: qty 2

## 2023-01-14 MED ORDER — PROPOFOL 10 MG/ML IV BOLUS
INTRAVENOUS | Status: AC
  Filled 2023-01-14: qty 20

## 2023-01-14 MED ORDER — ONDANSETRON HCL 4 MG/2ML IJ SOLN: 4.0000 mg | Freq: Four times a day (QID) | INTRAMUSCULAR | Status: DC | PRN

## 2023-01-14 MED ORDER — BUPIVACAINE-EPINEPHRINE 0.25% -1:200000 IJ SOLN
INTRAMUSCULAR | Status: DC | PRN
  Administered 2023-01-14: 5 mL

## 2023-01-14 MED ORDER — OXYCODONE HCL 5 MG/5ML PO SOLN: 5.0000 mg | Freq: Once | ORAL | Status: DC | PRN

## 2023-01-14 SURGICAL SUPPLY — 26 items
ADH SKN CLS APL DERMABOND .7 (GAUZE/BANDAGES/DRESSINGS) ×1
APL PRP STRL LF DISP 70% ISPRP (MISCELLANEOUS) ×1
BLADE SURG 15 STRL LF DISP TIS (BLADE) ×2 IMPLANT
BLADE SURG 15 STRL SS (BLADE) ×1
CHLORAPREP W/TINT 26 (MISCELLANEOUS) ×2 IMPLANT
COVER BACK TABLE 60X90IN (DRAPES) ×2 IMPLANT
COVER MAYO STAND STRL (DRAPES) ×2 IMPLANT
DERMABOND ADVANCED .7 DNX12 (GAUZE/BANDAGES/DRESSINGS) ×2 IMPLANT
DRAPE LAPAROTOMY 100X72 PEDS (DRAPES) ×2 IMPLANT
DRAPE UTILITY XL STRL (DRAPES) ×2 IMPLANT
ELECT COATED BLADE 2.86 ST (ELECTRODE) IMPLANT
ELECT REM PT RETURN 9FT ADLT (ELECTROSURGICAL) ×1
ELECTRODE REM PT RTRN 9FT ADLT (ELECTROSURGICAL) IMPLANT
GLOVE BIO SURGEON STRL SZ7.5 (GLOVE) ×2 IMPLANT
GOWN STRL REUS W/ TWL LRG LVL3 (GOWN DISPOSABLE) ×4 IMPLANT
GOWN STRL REUS W/TWL LRG LVL3 (GOWN DISPOSABLE) ×2
NDL HYPO 25X1 1.5 SAFETY (NEEDLE) ×2 IMPLANT
NEEDLE HYPO 25X1 1.5 SAFETY (NEEDLE) ×1 IMPLANT
PACK BASIN DAY SURGERY FS (CUSTOM PROCEDURE TRAY) ×2 IMPLANT
PENCIL SMOKE EVACUATOR (MISCELLANEOUS) IMPLANT
SPIKE FLUID TRANSFER (MISCELLANEOUS) ×2 IMPLANT
SUT MON AB 4-0 PC3 18 (SUTURE) ×2 IMPLANT
SUT VIC AB 3-0 SH 27 (SUTURE) ×1
SUT VIC AB 3-0 SH 27X BRD (SUTURE) ×2 IMPLANT
SYR CONTROL 10ML LL (SYRINGE) ×2 IMPLANT
TOWEL GREEN STERILE FF (TOWEL DISPOSABLE) ×2 IMPLANT

## 2023-01-14 NOTE — Interval H&P Note (Signed)
History and Physical Interval Note:  01/14/2023 8:01 AM  [name]  has presented today for surgery, with the diagnosis of BILATERAL BREAST CANCER.  The various methods of treatment have been discussed with the patient and family. After consideration of risks, benefits and other options for treatment, the patient has consented to  Procedure(s): REMOVAL PORT-A-CATH (N/A) as a surgical intervention.  The patient's history has been reviewed, patient examined, no change in status, stable for surgery.  I have reviewed the patient's chart and labs.  Questions were answered to the patient's satisfaction.     Chevis Pretty III

## 2023-01-14 NOTE — Anesthesia Postprocedure Evaluation (Signed)
Anesthesia Post Note  Patient: [name]  Procedure(s) Performed: REMOVAL PORT-A-CATH (Right: Chest)     Patient location during evaluation: PACU Anesthesia Type: MAC Level of consciousness: awake and alert Pain management: pain level controlled Vital Signs Assessment: post-procedure vital signs reviewed and stable Respiratory status: spontaneous breathing, nonlabored ventilation, respiratory function stable and patient connected to nasal cannula oxygen Cardiovascular status: stable and blood pressure returned to baseline Postop Assessment: no apparent nausea or vomiting Anesthetic complications: no   No notable events documented.  Last Vitals:  Vitals:   01/14/23 0900 01/14/23 0916  BP: 98/70 126/69  Pulse: 90 85  Resp: 10 16  Temp:  (!) 36.2 C  SpO2: 95% 94%    Last Pain:  Vitals:   01/14/23 0916  TempSrc:   PainSc: 0-No pain                 , S

## 2023-01-14 NOTE — H&P (Signed)
PROVIDER: Lindell Noe, MD  MRN: [id number] DOB: [birth year] Subjective   Chief Complaint: Follow-up (1ST POST OP PT-MC-OPB-BIL MASTIES W/ BIL SLNB)   History of Present Illness: [name] is a 62 y.o. female who is seen today for bilateral breast cancer. The patient is a 62 year old white female who is about 5 months status post left mastectomy and sentinel node biopsy for a T3 N0 breast cancer that was ER positive and PR negative and HER2 negative with a Ki-67 of 1%. She also had a right mastectomy and sentinel node biopsy for a T3 N0 triple negative breast cancer with a Ki-67 of 0%. She tolerated the surgery well.  She denies any chest wall pain.    Review of Systems: A complete review of systems was obtained from the patient. I have reviewed this information and discussed as appropriate with the patient. See HPI as well for other ROS.  ROS   Medical History: Past Medical History:  Diagnosis Date  Arthritis  GERD (gastroesophageal reflux disease)  History of cancer  Hypertension   Patient Active Problem List  Diagnosis  Essential hypertension  Malignant neoplasm of overlapping sites of both breasts in female, estrogen receptor negative  Malignant neoplasm of upper-outer quadrant of right breast in female, estrogen receptor negative   Past Surgical History:  Procedure Laterality Date  Back Surgery N/A  1989, 1990, 2006  CHOLECYSTECTOMY N/A  1998  HYSTERECTOMY N/A  Partial hysterectomy  MASTECTOMY    Allergies  Allergen Reactions  Codeine Phosphate Unknown  REACTION: unspecified  Simvastatin Other (See Comments)  REACTION: muscle \\T \ joint pains  Sulfamethoxazole Unknown  REACTION: unspecified   Current Outpatient Medications on File Prior to Visit  Medication Sig Dispense Refill  albuterol 90 mcg/actuation inhaler Inhale 2 inhalations into the lungs every 6 (six) hours as needed for Shortness of Breath or Wheezing  amLODIPine (NORVASC) 10 MG tablet Take  10 mg by mouth once daily  dexlansoprazole (DEXILANT) 60 mg DR capsule Take 60 mg by mouth once daily  loratadine (CLARITIN) 10 mg tablet Take 10 mg by mouth once daily  OZEMPIC 1 mg/dose (4 mg/3 mL) pen injector Inject 1 mg subcutaneously once a week   No current facility-administered medications on file prior to visit.   Family History  Problem Relation Age of Onset  High blood pressure (Hypertension) Mother  Diabetes Mother  Myocardial Infarction (Heart attack) Mother  High blood pressure (Hypertension) Father  Myocardial Infarction (Heart attack) Father  Diabetes Father  Breast cancer Sister    Social History   Tobacco Use  Smoking Status Every Day  Packs/day: 1  Types: Cigarettes  Smokeless Tobacco Never    Social History   Socioeconomic History  Marital status: Married  Tobacco Use  Smoking status: Every Day  Packs/day: 1  Types: Cigarettes  Smokeless tobacco: Never  Vaping Use  Vaping Use: Never used  Substance and Sexual Activity  Alcohol use: Not Currently  Drug use: Never   Objective:   Vitals:  BP: 125/80  Pulse: 60  Temp: 36.9 C (98.4 F)  SpO2: 96%  Weight: 96.2 kg (212 lb)  Height: 165.1 cm (5\' 5" )   Body mass index is 35.28 kg/m.  Physical Exam Vitals reviewed.  Constitutional:  General: She is not in acute distress. Appearance: Normal appearance.  HENT:  Head: Normocephalic and atraumatic.  Right Ear: External ear normal.  Left Ear: External ear normal.  Nose: Nose normal.  Mouth/Throat:  Mouth: Mucous membranes are moist.  Pharynx:  Oropharynx is clear.  Eyes:  General: No scleral icterus. Extraocular Movements: Extraocular movements intact.  Conjunctiva/sclera: Conjunctivae normal.  Pupils: Pupils are equal, round, and reactive to light.  Cardiovascular:  Rate and Rhythm: Normal rate and regular rhythm.  Pulses: Normal pulses.  Heart sounds: Normal heart sounds.  Pulmonary:  Effort: Pulmonary effort is normal. No  respiratory distress.  Breath sounds: Normal breath sounds.  Abdominal:  General: Bowel sounds are normal.  Palpations: Abdomen is soft.  Tenderness: There is no abdominal tenderness.  Musculoskeletal:  General: No swelling, tenderness or deformity. Normal range of motion.  Cervical back: Normal range of motion and neck supple.  Skin: General: Skin is warm and dry.  Coloration: Skin is not jaundiced.  Neurological:  General: No focal deficit present.  Mental Status: She is alert and oriented to person, place, and time.  Psychiatric:  Mood and Affect: Mood normal.  Behavior: Behavior normal.     Breast: Both mastectomy incisions are healing nicely with no sign of infection or seroma. The skin flaps are all healthy.  Labs, Imaging and Diagnostic Testing:  Assessment and Plan:   Diagnoses and all orders for this visit:  Malignant neoplasm of overlapping sites of both breasts in female, estrogen receptor negative     The patient is about 5 months status post bilateral mastectomies for bilateral breast cancer.  She returns today for port removal.  I have discussed with her in detail the risks and benefits of the operation as well as some of the technical aspects and she understands and wishes to proceed

## 2023-01-14 NOTE — Transfer of Care (Signed)
Immediate Anesthesia Transfer of Care Note  Patient: [name]  Procedure(s) Performed: REMOVAL PORT-A-CATH (Right: Chest)  Patient Location: PACU  Anesthesia Type:MAC  Level of Consciousness: awake, alert , and oriented  Airway & Oxygen Therapy: Patient Spontanous Breathing and Patient connected to face mask oxygen  Post-op Assessment: Report given to RN and Post -op Vital signs reviewed and stable  Post vital signs: Reviewed and stable  Last Vitals:  Vitals Value Taken Time  BP 113/69 01/14/23 0849  Temp 36.2 C 01/14/23 0849  Pulse 93 01/14/23 0851  Resp 16 01/14/23 0851  SpO2 98 % 01/14/23 0851  Vitals shown include unfiled device data.  Last Pain:  Vitals:   01/14/23 0726  TempSrc: Temporal  PainSc: 4       Patients Stated Pain Goal: 4 (01/14/23 0726)  Complications: No notable events documented.

## 2023-01-14 NOTE — Anesthesia Procedure Notes (Signed)
Procedure Name: MAC Date/Time: 01/14/2023 8:20 AM  Performed by: Cleda Clarks, CRNAPre-anesthesia Checklist: Patient identified, Emergency Drugs available, Suction available and Patient being monitored Patient Re-evaluated:Patient Re-evaluated prior to induction Oxygen Delivery Method: Simple face mask Preoxygenation: Pre-oxygenation with 100% oxygen Ventilation: Mask ventilation without difficulty

## 2023-01-14 NOTE — Discharge Instructions (Signed)

## 2023-01-14 NOTE — Anesthesia Preprocedure Evaluation (Signed)
Anesthesia Evaluation  Patient identified by MRN, date of birth, ID band Patient awake    Reviewed: Allergy & Precautions, H&P , NPO status , Patient's Chart, lab work & pertinent test results  Airway Mallampati: II   Neck ROM: full    Dental   Pulmonary asthma , former smoker   breath sounds clear to auscultation       Cardiovascular hypertension,  Rhythm:regular Rate:Normal     Neuro/Psych  PSYCHIATRIC DISORDERS Anxiety        GI/Hepatic ,GERD  ,,  Endo/Other  diabetes, Type 2    Renal/GU      Musculoskeletal  (+) Arthritis ,    Abdominal   Peds  Hematology   Anesthesia Other Findings   Reproductive/Obstetrics H/o breast CA                             Anesthesia Physical Anesthesia Plan  ASA: 3  Anesthesia Plan: MAC   Post-op Pain Management:    Induction: Intravenous  PONV Risk Score and Plan: 2 and Propofol infusion, Midazolam, Treatment may vary due to age or medical condition and Ondansetron  Airway Management Planned: Simple Face Mask  Additional Equipment:   Intra-op Plan:   Post-operative Plan:   Informed Consent: I have reviewed the patients History and Physical, chart, labs and discussed the procedure including the risks, benefits and alternatives for the proposed anesthesia with the patient or authorized representative who has indicated his/her understanding and acceptance.     Dental advisory given  Plan Discussed with: CRNA, Anesthesiologist and Surgeon  Anesthesia Plan Comments:        Anesthesia Quick Evaluation

## 2023-01-14 NOTE — Op Note (Signed)
01/14/2023  8:45 AM  PATIENT:  [name]  62 y.o. female  PRE-OPERATIVE DIAGNOSIS:  BILATERAL BREAST CANCER  POST-OPERATIVE DIAGNOSIS:  BILATERAL BREAST CANCER  PROCEDURE:  Procedure(s): REMOVAL PORT-A-CATH (Right)  SURGEON:  Surgeons and Role:    * Griselda Miner, MD - Primary  PHYSICIAN ASSISTANT:   ASSISTANTS: none   ANESTHESIA:   local and IV sedation  EBL:  5 mL   BLOOD ADMINISTERED:none  DRAINS: none   LOCAL MEDICATIONS USED:  MARCAINE    and LIDOCAINE   SPECIMEN:  No Specimen  DISPOSITION OF SPECIMEN:  N/A  COUNTS:  YES  TOURNIQUET:  * No tourniquets in log *  DICTATION: .Dragon Dictation  After informed consent the patient was brought to the operating room and placed in the supine position on the operating table.  After adequate IV sedation had been given then the patient's right chest and neck area were prepped with ChloraPrep, allowed to dry, and draped in usual sterile manner.  An appropriate timeout was performed.  The area around the port was infiltrated with 1% lidocaine and quarter percent Marcaine until a good field block was created.  A small incision was made with a 15 blade knife through her previous incision.  The incision was carried through the subcutaneous tissue sharply with the 15 blade knife until the capsule surrounding the port was opened.  The 2 anchoring stitches were divided and removed.  The port was then gently pushed out of its pocket and with gentle traction was removed from the patient without difficulty.  Pressure was held on the chest and neck area for several minutes until the area was hemostatic.  The tract from the tubing was closed with a figure-of-eight 3-0 Vicryl stitch.  The subcutaneous tissue was closed with interrupted 3-0 Vicryl stitches.  The skin was closed with interrupted 4-0 Monocryl subcuticular stitches.  Dermabond dressings were applied.  The patient tolerated the procedure well.  At the end of the case all needle  sponge and instrument counts were correct.  The patient was then awakened and taken recovery in stable condition.  PLAN OF CARE: Discharge to home after PACU  PATIENT DISPOSITION:  PACU - hemodynamically stable.   Delay start of Pharmacological VTE agent (>24hrs) due to surgical blood loss or risk of bleeding: not applicable

## 2023-01-17 ENCOUNTER — Encounter (HOSPITAL_BASED_OUTPATIENT_CLINIC_OR_DEPARTMENT_OTHER): Payer: Self-pay | Admitting: General Surgery

## 2023-01-17 ENCOUNTER — Other Ambulatory Visit (HOSPITAL_COMMUNITY): Payer: Self-pay

## 2023-01-17 ENCOUNTER — Encounter: Payer: Self-pay | Admitting: *Deleted

## 2023-01-17 ENCOUNTER — Encounter: Payer: Self-pay | Admitting: Hematology and Oncology

## 2023-01-18 ENCOUNTER — Other Ambulatory Visit (HOSPITAL_COMMUNITY): Payer: Self-pay

## 2023-01-18 ENCOUNTER — Other Ambulatory Visit (HOSPITAL_BASED_OUTPATIENT_CLINIC_OR_DEPARTMENT_OTHER): Payer: Self-pay

## 2023-01-24 ENCOUNTER — Ambulatory Visit
Admission: RE | Admit: 2023-01-24 | Discharge: 2023-01-24 | Disposition: A | Discharge status: Home / Self Care | Payer: Managed Care, Other (non HMO) | Source: Ambulatory Visit | Attending: Radiation Oncology | Admitting: Radiation Oncology

## 2023-01-24 ENCOUNTER — Ambulatory Visit: Payer: Self-pay

## 2023-01-24 ENCOUNTER — Telehealth: Payer: Self-pay

## 2023-01-24 DIAGNOSIS — C50411 Malignant neoplasm of upper-outer quadrant of right female breast: Secondary | ICD-10-CM | POA: Diagnosis present

## 2023-01-24 DIAGNOSIS — Z171 Estrogen receptor negative status [ER-]: Secondary | ICD-10-CM | POA: Diagnosis not present

## 2023-01-24 DIAGNOSIS — Z510 Encounter for antineoplastic radiation therapy: Secondary | ICD-10-CM | POA: Diagnosis present

## 2023-01-24 DIAGNOSIS — C50812 Malignant neoplasm of overlapping sites of left female breast: Secondary | ICD-10-CM | POA: Diagnosis present

## 2023-01-24 DIAGNOSIS — Z170 Estrogen receptor positive status [ER+]: Secondary | ICD-10-CM | POA: Insufficient documentation

## 2023-01-24 NOTE — Patient Outreach (Signed)
  Care Coordination   01/24/2023 Name: [name] MRN: [id number] DOB: 31-May-1961   Care Coordination Outreach Attempts:  An unsuccessful telephone outreach was attempted today to offer the patient information about available care coordination services.  Follow Up Plan:  Additional outreach attempts will be made to offer the patient care coordination information and services.   Encounter Outcome:  No Answer   Care Coordination Interventions:  No, not indicated   Bary Leriche, RN, MSN [facility] Care Management Care Management Coordinator Direct Line [phone]

## 2023-01-24 NOTE — Patient Outreach (Signed)
  Care Coordination   Follow Up Visit Note   01/24/2023 Name: [name] MRN: [id number] DOB: May 28, 1961  [name] is a 62 y.o. year old female who sees Larose Hires, Gaylyn Lambert, FNP for primary care. I spoke with  [name] by phone today.  What matters to the patients health and wellness today?  Beating cancer    Goals Addressed             This Visit's Progress    Beating Breast Cancer       Patient Goals/Self Care Activities: -Patient/Caregiver will self-administer medications as prescribed as evidenced by self-report/primary caregiver report  -Patient/Caregiver will attend all scheduled provider appointments as evidenced by clinician review of documented attendance to scheduled appointments and patient/caregiver report -Patient/Caregiver will call provider office for new concerns or questions as evidenced by review of documented incoming telephone call notes and patient report  -check blood sugar at prescribed times -record values and write them down take them to all doctor visits    -Patient feeling okay. Patient had porta cath removed. Radiation x6 weeks.     Patient reports blood sugars up and down. Depending on what she eats and when she eats.  Discussed diabetes mangement.         SDOH assessments and interventions completed:  Yes     Care Coordination Interventions:  Yes, provided   Follow up plan: Follow up call scheduled for September    Encounter Outcome:  Pt. Visit Completed   Bary Leriche, RN, MSN [facility] Care Management Care Management Coordinator Direct Line [phone]

## 2023-01-24 NOTE — Patient Instructions (Signed)
Visit Information  Thank you for taking time to visit with me today. Please don't hesitate to contact me if I can be of assistance to you.   Following are the goals we discussed today:   Goals Addressed             This Visit's Progress    Beating Breast Cancer       Patient Goals/Self Care Activities: -Patient/Caregiver will self-administer medications as prescribed as evidenced by self-report/primary caregiver report  -Patient/Caregiver will attend all scheduled provider appointments as evidenced by clinician review of documented attendance to scheduled appointments and patient/caregiver report -Patient/Caregiver will call provider office for new concerns or questions as evidenced by review of documented incoming telephone call notes and patient report  -check blood sugar at prescribed times -record values and write them down take them to all doctor visits    -Patient feeling okay. Patient had porta cath removed. Radiation x6 weeks.     Patient reports blood sugars up and down. Depending on what she eats and when she eats.  Discussed diabetes mangement.         Our next appointment is by telephone on 02/21/23 at 1200 pm  Please call the care guide team at [phone] if you need to cancel or reschedule your appointment.   If you are experiencing a Mental Health or Behavioral Health Crisis or need someone to talk to, please call the Suicide and Crisis Lifeline: 988   The patient verbalized understanding of instructions, educational materials, and care plan provided today and DECLINED offer to receive copy of patient instructions, educational materials, and care plan.   The patient has been provided with contact information for the care management team and has been advised to call with any health related questions or concerns.   Bary Leriche, RN, MSN [facility] Care Management Care Management Coordinator Direct Line [phone]

## 2023-01-31 ENCOUNTER — Encounter: Payer: Self-pay | Admitting: Hematology and Oncology

## 2023-01-31 ENCOUNTER — Other Ambulatory Visit (HOSPITAL_COMMUNITY): Payer: Self-pay

## 2023-01-31 ENCOUNTER — Encounter: Payer: Self-pay | Admitting: *Deleted

## 2023-01-31 DIAGNOSIS — C50812 Malignant neoplasm of overlapping sites of left female breast: Secondary | ICD-10-CM

## 2023-01-31 DIAGNOSIS — C50411 Malignant neoplasm of upper-outer quadrant of right female breast: Secondary | ICD-10-CM

## 2023-01-31 MED ORDER — XTAMPZA ER 36 MG PO C12A
36.0000 mg | EXTENDED_RELEASE_CAPSULE | Freq: Two times a day (BID) | ORAL | 0 refills | Status: DC
  Filled 2023-02-11: qty 60, 30d supply, fill #0

## 2023-01-31 MED ORDER — XTAMPZA ER 36 MG PO C12A
36.0000 mg | EXTENDED_RELEASE_CAPSULE | Freq: Two times a day (BID) | ORAL | 0 refills | Status: DC
  Filled 2023-03-15: qty 60, 30d supply, fill #0

## 2023-02-02 ENCOUNTER — Ambulatory Visit: Payer: Managed Care, Other (non HMO) | Admitting: Radiation Oncology

## 2023-02-03 ENCOUNTER — Ambulatory Visit: Payer: Managed Care, Other (non HMO)

## 2023-02-03 ENCOUNTER — Telehealth: Payer: Self-pay | Admitting: Hematology and Oncology

## 2023-02-03 NOTE — Telephone Encounter (Signed)
Left patient a message in regards to scheduled follow up visit with provider

## 2023-02-04 ENCOUNTER — Ambulatory Visit: Payer: Managed Care, Other (non HMO)

## 2023-02-04 DIAGNOSIS — Z510 Encounter for antineoplastic radiation therapy: Secondary | ICD-10-CM | POA: Diagnosis not present

## 2023-02-07 ENCOUNTER — Other Ambulatory Visit: Payer: Self-pay

## 2023-02-07 ENCOUNTER — Ambulatory Visit
Admission: RE | Admit: 2023-02-07 | Discharge: 2023-02-07 | Disposition: A | Discharge status: Home / Self Care | Payer: Managed Care, Other (non HMO) | Source: Ambulatory Visit | Attending: Radiation Oncology | Admitting: Radiation Oncology

## 2023-02-07 ENCOUNTER — Ambulatory Visit: Payer: Managed Care, Other (non HMO)

## 2023-02-07 DIAGNOSIS — Z510 Encounter for antineoplastic radiation therapy: Secondary | ICD-10-CM | POA: Diagnosis not present

## 2023-02-07 LAB — RAD ONC ARIA SESSION SUMMARY
Course Elapsed Days: 0
Plan Fractions Treated to Date: 1
Plan Fractions Treated to Date: 1
Plan Prescribed Dose Per Fraction: 2 Gy
Plan Prescribed Dose Per Fraction: 2 Gy
Plan Total Fractions Prescribed: 13
Plan Total Fractions Prescribed: 13
Plan Total Prescribed Dose: 26 Gy
Plan Total Prescribed Dose: 26 Gy
Reference Point Dosage Given to Date: 2 Gy
Reference Point Dosage Given to Date: 2 Gy
Reference Point Session Dosage Given: 2 Gy
Reference Point Session Dosage Given: 2 Gy
Session Number: 1

## 2023-02-07 MED ORDER — ALRA NON-METALLIC DEODORANT (RAD-ONC)
1.0000 | Freq: Once | TOPICAL | Status: AC
  Administered 2023-02-07: 1 via TOPICAL

## 2023-02-07 MED ORDER — RADIAPLEXRX EX GEL: Freq: Two times a day (BID) | CUTANEOUS | Status: DC

## 2023-02-07 NOTE — Progress Notes (Signed)
Pt here for patient teaching.    Pt given Radiation and You booklet, skin care instructions, Alra deodorant, and Radiaplex gel.    Reviewed areas of pertinence such as fatigue, skin changes, breast tenderness, and breast swelling .   Pt able to give teach back of to pat skin and use unscented/gentle soap,apply Radiaplex bid, avoid applying anything to skin within 4 hours of treatment, avoid wearing an under wire bra, and to use an electric razor if they must shave.   Pt verbalizes understanding of information given and will contact nursing with any questions or concerns.    [email or web address]

## 2023-02-08 ENCOUNTER — Other Ambulatory Visit: Payer: Self-pay

## 2023-02-08 ENCOUNTER — Ambulatory Visit: Payer: Managed Care, Other (non HMO)

## 2023-02-08 ENCOUNTER — Ambulatory Visit
Admission: RE | Admit: 2023-02-08 | Discharge: 2023-02-08 | Disposition: A | Discharge status: Home / Self Care | Payer: Managed Care, Other (non HMO) | Source: Ambulatory Visit | Attending: Radiation Oncology | Admitting: Radiation Oncology

## 2023-02-08 ENCOUNTER — Ambulatory Visit: Payer: Managed Care, Other (non HMO) | Admitting: Radiation Oncology

## 2023-02-08 DIAGNOSIS — Z510 Encounter for antineoplastic radiation therapy: Secondary | ICD-10-CM | POA: Diagnosis not present

## 2023-02-08 LAB — RAD ONC ARIA SESSION SUMMARY
Course Elapsed Days: 1
Plan Fractions Treated to Date: 1
Plan Fractions Treated to Date: 1
Plan Prescribed Dose Per Fraction: 2 Gy
Plan Prescribed Dose Per Fraction: 2 Gy
Plan Total Fractions Prescribed: 12
Plan Total Fractions Prescribed: 12
Plan Total Prescribed Dose: 24 Gy
Plan Total Prescribed Dose: 24 Gy
Reference Point Dosage Given to Date: 2 Gy
Reference Point Dosage Given to Date: 2 Gy
Reference Point Session Dosage Given: 2 Gy
Reference Point Session Dosage Given: 2 Gy
Session Number: 2

## 2023-02-09 ENCOUNTER — Other Ambulatory Visit: Payer: Self-pay

## 2023-02-09 ENCOUNTER — Ambulatory Visit
Admission: RE | Admit: 2023-02-09 | Discharge: 2023-02-09 | Disposition: A | Discharge status: Home / Self Care | Payer: Managed Care, Other (non HMO) | Source: Ambulatory Visit | Attending: Radiation Oncology | Admitting: Radiation Oncology

## 2023-02-09 DIAGNOSIS — Z510 Encounter for antineoplastic radiation therapy: Secondary | ICD-10-CM | POA: Diagnosis not present

## 2023-02-09 LAB — RAD ONC ARIA SESSION SUMMARY
Course Elapsed Days: 2
Plan Fractions Treated to Date: 2
Plan Fractions Treated to Date: 2
Plan Prescribed Dose Per Fraction: 2 Gy
Plan Prescribed Dose Per Fraction: 2 Gy
Plan Total Fractions Prescribed: 13
Plan Total Fractions Prescribed: 13
Plan Total Prescribed Dose: 26 Gy
Plan Total Prescribed Dose: 26 Gy
Reference Point Dosage Given to Date: 4 Gy
Reference Point Dosage Given to Date: 4 Gy
Reference Point Session Dosage Given: 2 Gy
Reference Point Session Dosage Given: 2 Gy
Session Number: 3

## 2023-02-10 ENCOUNTER — Ambulatory Visit
Admission: RE | Admit: 2023-02-10 | Discharge: 2023-02-10 | Disposition: A | Discharge status: Home / Self Care | Payer: Managed Care, Other (non HMO) | Source: Ambulatory Visit | Attending: Radiation Oncology | Admitting: Radiation Oncology

## 2023-02-10 ENCOUNTER — Other Ambulatory Visit: Payer: Self-pay

## 2023-02-10 DIAGNOSIS — Z510 Encounter for antineoplastic radiation therapy: Secondary | ICD-10-CM | POA: Diagnosis not present

## 2023-02-10 LAB — RAD ONC ARIA SESSION SUMMARY
Course Elapsed Days: 3
Plan Fractions Treated to Date: 2
Plan Fractions Treated to Date: 2
Plan Prescribed Dose Per Fraction: 2 Gy
Plan Prescribed Dose Per Fraction: 2 Gy
Plan Total Fractions Prescribed: 12
Plan Total Fractions Prescribed: 12
Plan Total Prescribed Dose: 24 Gy
Plan Total Prescribed Dose: 24 Gy
Reference Point Dosage Given to Date: 4 Gy
Reference Point Dosage Given to Date: 4 Gy
Reference Point Session Dosage Given: 2 Gy
Reference Point Session Dosage Given: 2 Gy
Session Number: 4

## 2023-02-11 ENCOUNTER — Ambulatory Visit
Admission: RE | Admit: 2023-02-11 | Discharge: 2023-02-11 | Disposition: A | Discharge status: Home / Self Care | Payer: Managed Care, Other (non HMO) | Source: Ambulatory Visit | Attending: Radiation Oncology | Admitting: Radiation Oncology

## 2023-02-11 ENCOUNTER — Other Ambulatory Visit: Payer: Self-pay

## 2023-02-11 ENCOUNTER — Other Ambulatory Visit (HOSPITAL_COMMUNITY): Payer: Self-pay

## 2023-02-11 ENCOUNTER — Ambulatory Visit: Payer: Managed Care, Other (non HMO)

## 2023-02-11 DIAGNOSIS — Z510 Encounter for antineoplastic radiation therapy: Secondary | ICD-10-CM | POA: Diagnosis not present

## 2023-02-11 LAB — RAD ONC ARIA SESSION SUMMARY
Course Elapsed Days: 4
Plan Fractions Treated to Date: 3
Plan Fractions Treated to Date: 3
Plan Prescribed Dose Per Fraction: 2 Gy
Plan Prescribed Dose Per Fraction: 2 Gy
Plan Total Fractions Prescribed: 13
Plan Total Fractions Prescribed: 13
Plan Total Prescribed Dose: 26 Gy
Plan Total Prescribed Dose: 26 Gy
Reference Point Dosage Given to Date: 6 Gy
Reference Point Dosage Given to Date: 6 Gy
Reference Point Session Dosage Given: 2 Gy
Reference Point Session Dosage Given: 2 Gy
Session Number: 5

## 2023-02-14 ENCOUNTER — Ambulatory Visit: Payer: Managed Care, Other (non HMO)

## 2023-02-15 ENCOUNTER — Other Ambulatory Visit: Payer: Self-pay

## 2023-02-15 ENCOUNTER — Ambulatory Visit
Admission: RE | Admit: 2023-02-15 | Discharge: 2023-02-15 | Disposition: A | Discharge status: Home / Self Care | Payer: Managed Care, Other (non HMO) | Source: Ambulatory Visit | Attending: Radiation Oncology | Admitting: Radiation Oncology

## 2023-02-15 ENCOUNTER — Other Ambulatory Visit (HOSPITAL_COMMUNITY): Payer: Self-pay

## 2023-02-15 DIAGNOSIS — C50411 Malignant neoplasm of upper-outer quadrant of right female breast: Secondary | ICD-10-CM | POA: Diagnosis present

## 2023-02-15 DIAGNOSIS — C50812 Malignant neoplasm of overlapping sites of left female breast: Secondary | ICD-10-CM | POA: Insufficient documentation

## 2023-02-15 DIAGNOSIS — Z510 Encounter for antineoplastic radiation therapy: Secondary | ICD-10-CM | POA: Insufficient documentation

## 2023-02-15 DIAGNOSIS — Z170 Estrogen receptor positive status [ER+]: Secondary | ICD-10-CM | POA: Diagnosis not present

## 2023-02-15 DIAGNOSIS — Z171 Estrogen receptor negative status [ER-]: Secondary | ICD-10-CM | POA: Diagnosis not present

## 2023-02-15 LAB — RAD ONC ARIA SESSION SUMMARY
Course Elapsed Days: 8
Plan Fractions Treated to Date: 3
Plan Fractions Treated to Date: 3
Plan Prescribed Dose Per Fraction: 2 Gy
Plan Prescribed Dose Per Fraction: 2 Gy
Plan Total Fractions Prescribed: 12
Plan Total Fractions Prescribed: 12
Plan Total Prescribed Dose: 24 Gy
Plan Total Prescribed Dose: 24 Gy
Reference Point Dosage Given to Date: 6 Gy
Reference Point Dosage Given to Date: 6 Gy
Reference Point Session Dosage Given: 2 Gy
Reference Point Session Dosage Given: 2 Gy
Session Number: 6

## 2023-02-16 ENCOUNTER — Other Ambulatory Visit: Payer: Self-pay

## 2023-02-16 ENCOUNTER — Ambulatory Visit
Admission: RE | Admit: 2023-02-16 | Discharge: 2023-02-16 | Disposition: A | Discharge status: Home / Self Care | Payer: Managed Care, Other (non HMO) | Source: Ambulatory Visit | Attending: Radiation Oncology | Admitting: Radiation Oncology

## 2023-02-16 DIAGNOSIS — Z510 Encounter for antineoplastic radiation therapy: Secondary | ICD-10-CM | POA: Diagnosis not present

## 2023-02-16 LAB — RAD ONC ARIA SESSION SUMMARY
Course Elapsed Days: 9
Plan Fractions Treated to Date: 4
Plan Fractions Treated to Date: 4
Plan Prescribed Dose Per Fraction: 2 Gy
Plan Prescribed Dose Per Fraction: 2 Gy
Plan Total Fractions Prescribed: 13
Plan Total Fractions Prescribed: 13
Plan Total Prescribed Dose: 26 Gy
Plan Total Prescribed Dose: 26 Gy
Reference Point Dosage Given to Date: 8 Gy
Reference Point Dosage Given to Date: 8 Gy
Reference Point Session Dosage Given: 2 Gy
Reference Point Session Dosage Given: 2 Gy
Session Number: 7

## 2023-02-17 ENCOUNTER — Other Ambulatory Visit: Payer: Self-pay

## 2023-02-17 ENCOUNTER — Ambulatory Visit
Admission: RE | Admit: 2023-02-17 | Discharge: 2023-02-17 | Disposition: A | Discharge status: Home / Self Care | Payer: Managed Care, Other (non HMO) | Source: Ambulatory Visit | Attending: Radiation Oncology | Admitting: Radiation Oncology

## 2023-02-17 DIAGNOSIS — Z510 Encounter for antineoplastic radiation therapy: Secondary | ICD-10-CM | POA: Diagnosis not present

## 2023-02-17 LAB — RAD ONC ARIA SESSION SUMMARY
Course Elapsed Days: 10
Plan Fractions Treated to Date: 4
Plan Fractions Treated to Date: 4
Plan Prescribed Dose Per Fraction: 2 Gy
Plan Prescribed Dose Per Fraction: 2 Gy
Plan Total Fractions Prescribed: 12
Plan Total Fractions Prescribed: 12
Plan Total Prescribed Dose: 24 Gy
Plan Total Prescribed Dose: 24 Gy
Reference Point Dosage Given to Date: 8 Gy
Reference Point Dosage Given to Date: 8 Gy
Reference Point Session Dosage Given: 2 Gy
Reference Point Session Dosage Given: 2 Gy
Session Number: 8

## 2023-02-18 ENCOUNTER — Other Ambulatory Visit: Payer: Self-pay

## 2023-02-18 ENCOUNTER — Ambulatory Visit
Admission: RE | Admit: 2023-02-18 | Discharge: 2023-02-18 | Disposition: A | Discharge status: Home / Self Care | Payer: Managed Care, Other (non HMO) | Source: Ambulatory Visit | Attending: Radiation Oncology | Admitting: Radiation Oncology

## 2023-02-18 DIAGNOSIS — Z510 Encounter for antineoplastic radiation therapy: Secondary | ICD-10-CM | POA: Diagnosis not present

## 2023-02-18 LAB — RAD ONC ARIA SESSION SUMMARY
Course Elapsed Days: 11
Plan Fractions Treated to Date: 5
Plan Fractions Treated to Date: 5
Plan Prescribed Dose Per Fraction: 2 Gy
Plan Prescribed Dose Per Fraction: 2 Gy
Plan Total Fractions Prescribed: 13
Plan Total Fractions Prescribed: 13
Plan Total Prescribed Dose: 26 Gy
Plan Total Prescribed Dose: 26 Gy
Reference Point Dosage Given to Date: 10 Gy
Reference Point Dosage Given to Date: 10 Gy
Reference Point Session Dosage Given: 2 Gy
Reference Point Session Dosage Given: 2 Gy
Session Number: 9

## 2023-02-21 ENCOUNTER — Ambulatory Visit: Payer: Self-pay

## 2023-02-21 ENCOUNTER — Telehealth: Payer: Self-pay

## 2023-02-21 ENCOUNTER — Ambulatory Visit: Payer: Managed Care, Other (non HMO)

## 2023-02-21 NOTE — Patient Outreach (Signed)
  Care Coordination   02/21/2023 Name: MANVIR BUAN MRN: [id number] DOB: June 30, 1960   Care Coordination Outreach Attempts:  An unsuccessful telephone outreach was attempted today to offer the patient information about available care coordination services.  Follow Up Plan:  Additional outreach attempts will be made to offer the patient care coordination information and services.   Encounter Outcome:  No Answer   Care Coordination Interventions:  No, not indicated    Bary Leriche, RN, MSN [facility]  [facility], [facility] Management Community Coordinator Direct Dial: [phone]  Fax: [phone] Website: [email or web address]

## 2023-02-21 NOTE — Telephone Encounter (Signed)
RN left message for pt to check on her after she missed her radiation treatment this am. RN left call back information with message.

## 2023-02-21 NOTE — Telephone Encounter (Signed)
RN was able to reach pt and pt stated she was recovering from an upset stomach over the weekend. Pt denied fevers however Rn did still recommended a Covid test be performed. Pt stated she would do this. Pt stated she is going to try to come tomorrow to treatment.

## 2023-02-22 ENCOUNTER — Ambulatory Visit: Payer: Managed Care, Other (non HMO)

## 2023-02-22 ENCOUNTER — Other Ambulatory Visit: Payer: Self-pay

## 2023-02-22 ENCOUNTER — Ambulatory Visit
Admission: RE | Admit: 2023-02-22 | Discharge: 2023-02-22 | Disposition: A | Discharge status: Home / Self Care | Payer: Managed Care, Other (non HMO) | Source: Ambulatory Visit | Attending: Radiation Oncology | Admitting: Radiation Oncology

## 2023-02-22 DIAGNOSIS — Z510 Encounter for antineoplastic radiation therapy: Secondary | ICD-10-CM | POA: Diagnosis not present

## 2023-02-22 LAB — RAD ONC ARIA SESSION SUMMARY
Course Elapsed Days: 15
Plan Fractions Treated to Date: 5
Plan Fractions Treated to Date: 5
Plan Prescribed Dose Per Fraction: 2 Gy
Plan Prescribed Dose Per Fraction: 2 Gy
Plan Total Fractions Prescribed: 12
Plan Total Fractions Prescribed: 12
Plan Total Prescribed Dose: 24 Gy
Plan Total Prescribed Dose: 24 Gy
Reference Point Dosage Given to Date: 10 Gy
Reference Point Dosage Given to Date: 10 Gy
Reference Point Session Dosage Given: 2 Gy
Reference Point Session Dosage Given: 2 Gy
Session Number: 10

## 2023-02-23 ENCOUNTER — Ambulatory Visit
Admission: RE | Admit: 2023-02-23 | Discharge: 2023-02-23 | Disposition: A | Discharge status: Home / Self Care | Payer: Managed Care, Other (non HMO) | Source: Ambulatory Visit | Attending: Radiation Oncology | Admitting: Radiation Oncology

## 2023-02-23 ENCOUNTER — Ambulatory Visit: Payer: Managed Care, Other (non HMO)

## 2023-02-23 ENCOUNTER — Other Ambulatory Visit: Payer: Self-pay

## 2023-02-23 DIAGNOSIS — Z510 Encounter for antineoplastic radiation therapy: Secondary | ICD-10-CM | POA: Diagnosis not present

## 2023-02-23 LAB — RAD ONC ARIA SESSION SUMMARY
Course Elapsed Days: 16
Plan Fractions Treated to Date: 6
Plan Fractions Treated to Date: 6
Plan Prescribed Dose Per Fraction: 2 Gy
Plan Prescribed Dose Per Fraction: 2 Gy
Plan Total Fractions Prescribed: 13
Plan Total Fractions Prescribed: 13
Plan Total Prescribed Dose: 26 Gy
Plan Total Prescribed Dose: 26 Gy
Reference Point Dosage Given to Date: 12 Gy
Reference Point Dosage Given to Date: 12 Gy
Reference Point Session Dosage Given: 2 Gy
Reference Point Session Dosage Given: 2 Gy
Session Number: 11

## 2023-02-24 ENCOUNTER — Ambulatory Visit: Payer: Managed Care, Other (non HMO)

## 2023-02-24 ENCOUNTER — Other Ambulatory Visit: Payer: Self-pay

## 2023-02-24 ENCOUNTER — Ambulatory Visit
Admission: RE | Admit: 2023-02-24 | Discharge: 2023-02-24 | Disposition: A | Discharge status: Home / Self Care | Payer: Managed Care, Other (non HMO) | Source: Ambulatory Visit | Attending: Radiation Oncology | Admitting: Radiation Oncology

## 2023-02-24 DIAGNOSIS — Z510 Encounter for antineoplastic radiation therapy: Secondary | ICD-10-CM | POA: Diagnosis not present

## 2023-02-24 LAB — RAD ONC ARIA SESSION SUMMARY
Course Elapsed Days: 17
Plan Fractions Treated to Date: 6
Plan Fractions Treated to Date: 6
Plan Prescribed Dose Per Fraction: 2 Gy
Plan Prescribed Dose Per Fraction: 2 Gy
Plan Total Fractions Prescribed: 12
Plan Total Fractions Prescribed: 12
Plan Total Prescribed Dose: 24 Gy
Plan Total Prescribed Dose: 24 Gy
Reference Point Dosage Given to Date: 12 Gy
Reference Point Dosage Given to Date: 12 Gy
Reference Point Session Dosage Given: 2 Gy
Reference Point Session Dosage Given: 2 Gy
Session Number: 12

## 2023-02-25 ENCOUNTER — Ambulatory Visit
Admission: RE | Admit: 2023-02-25 | Discharge: 2023-02-25 | Disposition: A | Discharge status: Home / Self Care | Payer: Managed Care, Other (non HMO) | Source: Ambulatory Visit | Attending: Radiation Oncology | Admitting: Radiation Oncology

## 2023-02-25 ENCOUNTER — Ambulatory Visit: Payer: Managed Care, Other (non HMO)

## 2023-02-25 ENCOUNTER — Other Ambulatory Visit: Payer: Self-pay

## 2023-02-25 DIAGNOSIS — Z510 Encounter for antineoplastic radiation therapy: Secondary | ICD-10-CM | POA: Diagnosis not present

## 2023-02-25 LAB — RAD ONC ARIA SESSION SUMMARY
Course Elapsed Days: 18
Plan Fractions Treated to Date: 7
Plan Fractions Treated to Date: 7
Plan Prescribed Dose Per Fraction: 2 Gy
Plan Prescribed Dose Per Fraction: 2 Gy
Plan Total Fractions Prescribed: 13
Plan Total Fractions Prescribed: 13
Plan Total Prescribed Dose: 26 Gy
Plan Total Prescribed Dose: 26 Gy
Reference Point Dosage Given to Date: 14 Gy
Reference Point Dosage Given to Date: 14 Gy
Reference Point Session Dosage Given: 2 Gy
Reference Point Session Dosage Given: 2 Gy
Session Number: 13

## 2023-02-28 ENCOUNTER — Other Ambulatory Visit: Payer: Self-pay

## 2023-02-28 ENCOUNTER — Ambulatory Visit: Payer: Managed Care, Other (non HMO)

## 2023-02-28 ENCOUNTER — Ambulatory Visit
Admission: RE | Admit: 2023-02-28 | Discharge: 2023-02-28 | Disposition: A | Discharge status: Home / Self Care | Payer: Managed Care, Other (non HMO) | Source: Ambulatory Visit | Attending: Radiation Oncology | Admitting: Radiation Oncology

## 2023-02-28 DIAGNOSIS — Z510 Encounter for antineoplastic radiation therapy: Secondary | ICD-10-CM | POA: Diagnosis not present

## 2023-02-28 LAB — RAD ONC ARIA SESSION SUMMARY
Course Elapsed Days: 21
Plan Fractions Treated to Date: 7
Plan Fractions Treated to Date: 7
Plan Prescribed Dose Per Fraction: 2 Gy
Plan Prescribed Dose Per Fraction: 2 Gy
Plan Total Fractions Prescribed: 12
Plan Total Fractions Prescribed: 12
Plan Total Prescribed Dose: 24 Gy
Plan Total Prescribed Dose: 24 Gy
Reference Point Dosage Given to Date: 14 Gy
Reference Point Dosage Given to Date: 14 Gy
Reference Point Session Dosage Given: 2 Gy
Reference Point Session Dosage Given: 2 Gy
Session Number: 14

## 2023-03-01 ENCOUNTER — Other Ambulatory Visit: Payer: Self-pay

## 2023-03-01 ENCOUNTER — Ambulatory Visit
Admission: RE | Admit: 2023-03-01 | Discharge: 2023-03-01 | Disposition: A | Discharge status: Home / Self Care | Payer: Managed Care, Other (non HMO) | Source: Ambulatory Visit | Attending: Radiation Oncology | Admitting: Radiation Oncology

## 2023-03-01 ENCOUNTER — Ambulatory Visit: Payer: Managed Care, Other (non HMO)

## 2023-03-01 DIAGNOSIS — Z510 Encounter for antineoplastic radiation therapy: Secondary | ICD-10-CM | POA: Diagnosis not present

## 2023-03-01 LAB — RAD ONC ARIA SESSION SUMMARY
Course Elapsed Days: 22
Plan Fractions Treated to Date: 8
Plan Fractions Treated to Date: 8
Plan Prescribed Dose Per Fraction: 2 Gy
Plan Prescribed Dose Per Fraction: 2 Gy
Plan Total Fractions Prescribed: 13
Plan Total Fractions Prescribed: 13
Plan Total Prescribed Dose: 26 Gy
Plan Total Prescribed Dose: 26 Gy
Reference Point Dosage Given to Date: 16 Gy
Reference Point Dosage Given to Date: 16 Gy
Reference Point Session Dosage Given: 2 Gy
Reference Point Session Dosage Given: 2 Gy
Session Number: 15

## 2023-03-02 ENCOUNTER — Other Ambulatory Visit: Payer: Self-pay

## 2023-03-02 ENCOUNTER — Encounter: Payer: Self-pay | Admitting: Hematology and Oncology

## 2023-03-02 ENCOUNTER — Ambulatory Visit: Payer: Managed Care, Other (non HMO)

## 2023-03-02 ENCOUNTER — Other Ambulatory Visit (HOSPITAL_COMMUNITY): Payer: Self-pay

## 2023-03-02 ENCOUNTER — Ambulatory Visit
Admission: RE | Admit: 2023-03-02 | Discharge: 2023-03-02 | Disposition: A | Discharge status: Home / Self Care | Payer: Managed Care, Other (non HMO) | Source: Ambulatory Visit | Attending: Radiation Oncology | Admitting: Radiation Oncology

## 2023-03-02 DIAGNOSIS — Z510 Encounter for antineoplastic radiation therapy: Secondary | ICD-10-CM | POA: Diagnosis not present

## 2023-03-02 LAB — RAD ONC ARIA SESSION SUMMARY
Course Elapsed Days: 23
Plan Fractions Treated to Date: 8
Plan Fractions Treated to Date: 8
Plan Prescribed Dose Per Fraction: 2 Gy
Plan Prescribed Dose Per Fraction: 2 Gy
Plan Total Fractions Prescribed: 12
Plan Total Fractions Prescribed: 12
Plan Total Prescribed Dose: 24 Gy
Plan Total Prescribed Dose: 24 Gy
Reference Point Dosage Given to Date: 16 Gy
Reference Point Dosage Given to Date: 16 Gy
Reference Point Session Dosage Given: 2 Gy
Reference Point Session Dosage Given: 2 Gy
Session Number: 16

## 2023-03-03 ENCOUNTER — Encounter: Payer: Self-pay | Admitting: Hematology and Oncology

## 2023-03-03 ENCOUNTER — Ambulatory Visit: Payer: Managed Care, Other (non HMO)

## 2023-03-03 ENCOUNTER — Other Ambulatory Visit: Payer: Self-pay

## 2023-03-03 ENCOUNTER — Ambulatory Visit
Admission: RE | Admit: 2023-03-03 | Discharge: 2023-03-03 | Disposition: A | Discharge status: Home / Self Care | Payer: Managed Care, Other (non HMO) | Source: Ambulatory Visit | Attending: Radiation Oncology | Admitting: Radiation Oncology

## 2023-03-03 ENCOUNTER — Telehealth: Payer: Self-pay

## 2023-03-03 DIAGNOSIS — Z510 Encounter for antineoplastic radiation therapy: Secondary | ICD-10-CM | POA: Diagnosis not present

## 2023-03-03 LAB — RAD ONC ARIA SESSION SUMMARY
Course Elapsed Days: 24
Plan Fractions Treated to Date: 9
Plan Fractions Treated to Date: 9
Plan Prescribed Dose Per Fraction: 2 Gy
Plan Prescribed Dose Per Fraction: 2 Gy
Plan Total Fractions Prescribed: 13
Plan Total Fractions Prescribed: 13
Plan Total Prescribed Dose: 26 Gy
Plan Total Prescribed Dose: 26 Gy
Reference Point Dosage Given to Date: 18 Gy
Reference Point Dosage Given to Date: 18 Gy
Reference Point Session Dosage Given: 2 Gy
Reference Point Session Dosage Given: 2 Gy
Session Number: 17

## 2023-03-03 NOTE — Patient Outreach (Signed)
Care Coordination   03/03/2023 Name: MIRCALE CHEAH MRN: [id number] DOB: 06/24/1960   Care Coordination Outreach Attempts:  A second unsuccessful outreach was attempted today to offer the patient with information about available care coordination services.  Follow Up Plan:  Additional outreach attempts will be made to offer the patient care coordination information and services.   Encounter Outcome:  No Answer   Care Coordination Interventions:  No, not indicated    Bary Leriche, RN, MSN [facility]  [facility], [facility] Management Community Coordinator Direct Dial: [phone]  Fax: [phone] Website: [email or web address]

## 2023-03-04 ENCOUNTER — Ambulatory Visit: Payer: Managed Care, Other (non HMO)

## 2023-03-04 ENCOUNTER — Other Ambulatory Visit: Payer: Self-pay

## 2023-03-04 ENCOUNTER — Ambulatory Visit
Admission: RE | Admit: 2023-03-04 | Discharge: 2023-03-04 | Disposition: A | Discharge status: Home / Self Care | Payer: Managed Care, Other (non HMO) | Source: Ambulatory Visit | Attending: Radiation Oncology | Admitting: Radiation Oncology

## 2023-03-04 ENCOUNTER — Encounter: Payer: Self-pay | Admitting: Hematology and Oncology

## 2023-03-04 DIAGNOSIS — Z510 Encounter for antineoplastic radiation therapy: Secondary | ICD-10-CM | POA: Diagnosis not present

## 2023-03-04 LAB — RAD ONC ARIA SESSION SUMMARY
Course Elapsed Days: 25
Plan Fractions Treated to Date: 9
Plan Fractions Treated to Date: 9
Plan Prescribed Dose Per Fraction: 2 Gy
Plan Prescribed Dose Per Fraction: 2 Gy
Plan Total Fractions Prescribed: 12
Plan Total Fractions Prescribed: 12
Plan Total Prescribed Dose: 24 Gy
Plan Total Prescribed Dose: 24 Gy
Reference Point Dosage Given to Date: 18 Gy
Reference Point Dosage Given to Date: 18 Gy
Reference Point Session Dosage Given: 2 Gy
Reference Point Session Dosage Given: 2 Gy
Session Number: 18

## 2023-03-07 ENCOUNTER — Other Ambulatory Visit: Payer: Self-pay

## 2023-03-07 ENCOUNTER — Ambulatory Visit
Admission: RE | Admit: 2023-03-07 | Discharge: 2023-03-07 | Disposition: A | Discharge status: Home / Self Care | Payer: Managed Care, Other (non HMO) | Source: Ambulatory Visit | Attending: Radiation Oncology | Admitting: Radiation Oncology

## 2023-03-07 ENCOUNTER — Ambulatory Visit: Payer: Managed Care, Other (non HMO)

## 2023-03-07 DIAGNOSIS — Z510 Encounter for antineoplastic radiation therapy: Secondary | ICD-10-CM | POA: Diagnosis not present

## 2023-03-07 DIAGNOSIS — C50812 Malignant neoplasm of overlapping sites of left female breast: Secondary | ICD-10-CM

## 2023-03-07 LAB — RAD ONC ARIA SESSION SUMMARY
Course Elapsed Days: 28
Plan Fractions Treated to Date: 10
Plan Fractions Treated to Date: 10
Plan Prescribed Dose Per Fraction: 2 Gy
Plan Prescribed Dose Per Fraction: 2 Gy
Plan Total Fractions Prescribed: 13
Plan Total Fractions Prescribed: 13
Plan Total Prescribed Dose: 26 Gy
Plan Total Prescribed Dose: 26 Gy
Reference Point Dosage Given to Date: 20 Gy
Reference Point Dosage Given to Date: 20 Gy
Reference Point Session Dosage Given: 2 Gy
Reference Point Session Dosage Given: 2 Gy
Session Number: 19

## 2023-03-07 MED ORDER — RADIAPLEXRX EX GEL: Freq: Once | CUTANEOUS | Status: AC

## 2023-03-08 ENCOUNTER — Ambulatory Visit: Payer: Managed Care, Other (non HMO)

## 2023-03-08 ENCOUNTER — Ambulatory Visit
Admission: RE | Admit: 2023-03-08 | Discharge: 2023-03-08 | Disposition: A | Discharge status: Home / Self Care | Payer: Managed Care, Other (non HMO) | Source: Ambulatory Visit | Attending: Radiation Oncology | Admitting: Radiation Oncology

## 2023-03-08 ENCOUNTER — Other Ambulatory Visit: Payer: Self-pay

## 2023-03-08 ENCOUNTER — Encounter: Payer: Self-pay | Admitting: Hematology and Oncology

## 2023-03-08 DIAGNOSIS — Z510 Encounter for antineoplastic radiation therapy: Secondary | ICD-10-CM | POA: Diagnosis not present

## 2023-03-08 LAB — RAD ONC ARIA SESSION SUMMARY
Course Elapsed Days: 29
Plan Fractions Treated to Date: 10
Plan Fractions Treated to Date: 10
Plan Prescribed Dose Per Fraction: 2 Gy
Plan Prescribed Dose Per Fraction: 2 Gy
Plan Total Fractions Prescribed: 12
Plan Total Fractions Prescribed: 12
Plan Total Prescribed Dose: 24 Gy
Plan Total Prescribed Dose: 24 Gy
Reference Point Dosage Given to Date: 20 Gy
Reference Point Dosage Given to Date: 20 Gy
Reference Point Session Dosage Given: 2 Gy
Reference Point Session Dosage Given: 2 Gy
Session Number: 20

## 2023-03-09 ENCOUNTER — Other Ambulatory Visit: Payer: Self-pay

## 2023-03-09 ENCOUNTER — Ambulatory Visit: Payer: Managed Care, Other (non HMO)

## 2023-03-09 ENCOUNTER — Ambulatory Visit
Admission: RE | Admit: 2023-03-09 | Discharge: 2023-03-09 | Disposition: A | Discharge status: Home / Self Care | Payer: Managed Care, Other (non HMO) | Source: Ambulatory Visit | Attending: Radiation Oncology | Admitting: Radiation Oncology

## 2023-03-09 DIAGNOSIS — Z510 Encounter for antineoplastic radiation therapy: Secondary | ICD-10-CM | POA: Diagnosis not present

## 2023-03-09 LAB — RAD ONC ARIA SESSION SUMMARY
Course Elapsed Days: 30
Plan Fractions Treated to Date: 11
Plan Fractions Treated to Date: 11
Plan Prescribed Dose Per Fraction: 2 Gy
Plan Prescribed Dose Per Fraction: 2 Gy
Plan Total Fractions Prescribed: 13
Plan Total Fractions Prescribed: 13
Plan Total Prescribed Dose: 26 Gy
Plan Total Prescribed Dose: 26 Gy
Reference Point Dosage Given to Date: 22 Gy
Reference Point Dosage Given to Date: 22 Gy
Reference Point Session Dosage Given: 2 Gy
Reference Point Session Dosage Given: 2 Gy
Session Number: 21

## 2023-03-10 ENCOUNTER — Other Ambulatory Visit: Payer: Self-pay

## 2023-03-10 ENCOUNTER — Ambulatory Visit
Admission: RE | Admit: 2023-03-10 | Discharge: 2023-03-10 | Disposition: A | Discharge status: Home / Self Care | Payer: Managed Care, Other (non HMO) | Source: Ambulatory Visit | Attending: Radiation Oncology | Admitting: Radiation Oncology

## 2023-03-10 DIAGNOSIS — Z510 Encounter for antineoplastic radiation therapy: Secondary | ICD-10-CM | POA: Diagnosis not present

## 2023-03-10 LAB — RAD ONC ARIA SESSION SUMMARY
Course Elapsed Days: 31
Plan Fractions Treated to Date: 11
Plan Fractions Treated to Date: 11
Plan Prescribed Dose Per Fraction: 2 Gy
Plan Prescribed Dose Per Fraction: 2 Gy
Plan Total Fractions Prescribed: 12
Plan Total Fractions Prescribed: 12
Plan Total Prescribed Dose: 24 Gy
Plan Total Prescribed Dose: 24 Gy
Reference Point Dosage Given to Date: 22 Gy
Reference Point Dosage Given to Date: 22 Gy
Reference Point Session Dosage Given: 2 Gy
Reference Point Session Dosage Given: 2 Gy
Session Number: 22

## 2023-03-11 ENCOUNTER — Encounter: Payer: Self-pay | Admitting: Hematology and Oncology

## 2023-03-11 ENCOUNTER — Telehealth: Payer: Self-pay

## 2023-03-11 ENCOUNTER — Other Ambulatory Visit: Payer: Self-pay

## 2023-03-11 ENCOUNTER — Ambulatory Visit
Admission: RE | Admit: 2023-03-11 | Discharge: 2023-03-11 | Disposition: A | Discharge status: Home / Self Care | Payer: Managed Care, Other (non HMO) | Source: Ambulatory Visit | Attending: Radiation Oncology | Admitting: Radiation Oncology

## 2023-03-11 DIAGNOSIS — Z510 Encounter for antineoplastic radiation therapy: Secondary | ICD-10-CM | POA: Diagnosis not present

## 2023-03-11 LAB — RAD ONC ARIA SESSION SUMMARY
Course Elapsed Days: 32
Plan Fractions Treated to Date: 12
Plan Fractions Treated to Date: 12
Plan Prescribed Dose Per Fraction: 2 Gy
Plan Prescribed Dose Per Fraction: 2 Gy
Plan Total Fractions Prescribed: 13
Plan Total Fractions Prescribed: 13
Plan Total Prescribed Dose: 26 Gy
Plan Total Prescribed Dose: 26 Gy
Reference Point Dosage Given to Date: 24 Gy
Reference Point Dosage Given to Date: 24 Gy
Reference Point Session Dosage Given: 2 Gy
Reference Point Session Dosage Given: 2 Gy
Session Number: 23

## 2023-03-11 NOTE — Patient Outreach (Signed)
Care Coordination   03/11/2023 Name: [name] MRN: [id number] DOB: 1960/10/16   Care Coordination Outreach Attempts:  A third unsuccessful outreach was attempted today to offer the patient with information about available care coordination services.  Follow Up Plan:  Additional outreach attempts will be made to offer the patient care coordination information and services.   Encounter Outcome:  No Answer   Care Coordination Interventions:  No, not indicated    Bary Leriche, RN, MSN [facility]  [facility], [facility] Management Community Coordinator Direct Dial: [phone]  Fax: [phone] Website: [email or web address]

## 2023-03-14 ENCOUNTER — Ambulatory Visit
Admission: RE | Admit: 2023-03-14 | Discharge: 2023-03-14 | Disposition: A | Discharge status: Home / Self Care | Payer: Managed Care, Other (non HMO) | Source: Ambulatory Visit | Attending: Radiation Oncology | Admitting: Radiation Oncology

## 2023-03-14 ENCOUNTER — Ambulatory Visit: Payer: Managed Care, Other (non HMO)

## 2023-03-14 ENCOUNTER — Other Ambulatory Visit: Payer: Self-pay

## 2023-03-14 ENCOUNTER — Telehealth: Payer: Self-pay

## 2023-03-14 DIAGNOSIS — Z510 Encounter for antineoplastic radiation therapy: Secondary | ICD-10-CM | POA: Diagnosis not present

## 2023-03-14 DIAGNOSIS — Z170 Estrogen receptor positive status [ER+]: Secondary | ICD-10-CM

## 2023-03-14 DIAGNOSIS — C50411 Malignant neoplasm of upper-outer quadrant of right female breast: Secondary | ICD-10-CM

## 2023-03-14 LAB — RAD ONC ARIA SESSION SUMMARY
Course Elapsed Days: 35
Plan Fractions Treated to Date: 12
Plan Fractions Treated to Date: 12
Plan Prescribed Dose Per Fraction: 2 Gy
Plan Prescribed Dose Per Fraction: 2 Gy
Plan Total Fractions Prescribed: 12
Plan Total Fractions Prescribed: 12
Plan Total Prescribed Dose: 24 Gy
Plan Total Prescribed Dose: 24 Gy
Reference Point Dosage Given to Date: 24 Gy
Reference Point Dosage Given to Date: 24 Gy
Reference Point Session Dosage Given: 2 Gy
Reference Point Session Dosage Given: 2 Gy
Session Number: 24

## 2023-03-14 MED ORDER — RADIAPLEXRX EX GEL: Freq: Once | CUTANEOUS | Status: AC

## 2023-03-14 NOTE — Patient Outreach (Signed)
Care Coordination   03/14/2023 Name: [name] MRN: [id number] DOB: 27-Apr-1961   Care Coordination Outreach Attempts:  A third unsuccessful outreach was attempted today to offer the patient with information about available care coordination services.  Follow Up Plan:  No further outreach attempts will be made at this time. We have been unable to contact the patient to offer or enroll patient in care coordination services  Encounter Outcome:  No Answer   Care Coordination Interventions:  No, not indicated    Dimetrius Montfort Idelle Jo, RN, MSN [facility]  [facility], [facility] Management Community Coordinator Direct Dial: [phone]  Fax: [phone] Website: [email or web address]

## 2023-03-15 ENCOUNTER — Other Ambulatory Visit: Payer: Self-pay

## 2023-03-15 ENCOUNTER — Ambulatory Visit: Payer: PRIVATE HEALTH INSURANCE

## 2023-03-15 ENCOUNTER — Ambulatory Visit
Admission: RE | Admit: 2023-03-15 | Discharge: 2023-03-15 | Disposition: A | Discharge status: Home / Self Care | Payer: PRIVATE HEALTH INSURANCE | Source: Ambulatory Visit | Attending: Radiation Oncology | Admitting: Radiation Oncology

## 2023-03-15 ENCOUNTER — Other Ambulatory Visit (HOSPITAL_COMMUNITY): Payer: Self-pay

## 2023-03-15 DIAGNOSIS — C50812 Malignant neoplasm of overlapping sites of left female breast: Secondary | ICD-10-CM | POA: Insufficient documentation

## 2023-03-15 DIAGNOSIS — Z171 Estrogen receptor negative status [ER-]: Secondary | ICD-10-CM | POA: Diagnosis present

## 2023-03-15 DIAGNOSIS — Z170 Estrogen receptor positive status [ER+]: Secondary | ICD-10-CM | POA: Diagnosis not present

## 2023-03-15 DIAGNOSIS — C50411 Malignant neoplasm of upper-outer quadrant of right female breast: Secondary | ICD-10-CM | POA: Diagnosis present

## 2023-03-15 DIAGNOSIS — Z510 Encounter for antineoplastic radiation therapy: Secondary | ICD-10-CM | POA: Insufficient documentation

## 2023-03-15 LAB — RAD ONC ARIA SESSION SUMMARY
Course Elapsed Days: 36
Plan Fractions Treated to Date: 1
Plan Fractions Treated to Date: 13
Plan Prescribed Dose Per Fraction: 2 Gy
Plan Prescribed Dose Per Fraction: 2 Gy
Plan Total Fractions Prescribed: 1
Plan Total Fractions Prescribed: 13
Plan Total Prescribed Dose: 2 Gy
Plan Total Prescribed Dose: 26 Gy
Reference Point Dosage Given to Date: 26 Gy
Reference Point Dosage Given to Date: 26 Gy
Reference Point Session Dosage Given: 2 Gy
Reference Point Session Dosage Given: 2 Gy
Session Number: 25

## 2023-03-15 MED ORDER — RADIAPLEXRX EX GEL
Freq: Once | CUTANEOUS | Status: AC
  Administered 2023-03-15: 1 via TOPICAL

## 2023-03-16 ENCOUNTER — Inpatient Hospital Stay: Payer: Managed Care, Other (non HMO) | Attending: Hematology and Oncology | Admitting: Hematology and Oncology

## 2023-03-16 ENCOUNTER — Ambulatory Visit: Payer: PRIVATE HEALTH INSURANCE

## 2023-03-16 ENCOUNTER — Other Ambulatory Visit (HOSPITAL_COMMUNITY): Payer: Self-pay

## 2023-03-16 VITALS — HR 90 | Temp 97.3°F | Resp 18 | Ht 66.0 in | Wt 199.6 lb

## 2023-03-16 DIAGNOSIS — Z86718 Personal history of other venous thrombosis and embolism: Secondary | ICD-10-CM | POA: Diagnosis not present

## 2023-03-16 DIAGNOSIS — C50812 Malignant neoplasm of overlapping sites of left female breast: Secondary | ICD-10-CM | POA: Diagnosis present

## 2023-03-16 DIAGNOSIS — Z171 Estrogen receptor negative status [ER-]: Secondary | ICD-10-CM | POA: Diagnosis not present

## 2023-03-16 DIAGNOSIS — Z170 Estrogen receptor positive status [ER+]: Secondary | ICD-10-CM | POA: Insufficient documentation

## 2023-03-16 DIAGNOSIS — Z7901 Long term (current) use of anticoagulants: Secondary | ICD-10-CM | POA: Diagnosis not present

## 2023-03-16 DIAGNOSIS — Z9013 Acquired absence of bilateral breasts and nipples: Secondary | ICD-10-CM | POA: Diagnosis not present

## 2023-03-16 DIAGNOSIS — Z79899 Other long term (current) drug therapy: Secondary | ICD-10-CM | POA: Diagnosis not present

## 2023-03-16 DIAGNOSIS — C50411 Malignant neoplasm of upper-outer quadrant of right female breast: Secondary | ICD-10-CM | POA: Diagnosis present

## 2023-03-16 DIAGNOSIS — Z923 Personal history of irradiation: Secondary | ICD-10-CM | POA: Insufficient documentation

## 2023-03-16 MED ORDER — ANASTROZOLE 1 MG PO TABS: 1.0000 mg | ORAL_TABLET | Freq: Every day | ORAL | 3 refills | Status: DC

## 2023-03-16 NOTE — Progress Notes (Signed)
Patient Care Team: Fatima Sanger, FNP as PCP - General (Internal Medicine) Donnelly Angelica, RN as Oncology Nurse Navigator Pershing Proud, RN as Oncology Nurse Navigator Serena Croissant, MD as Consulting Physician (Hematology and Oncology) Griselda Miner, MD as Consulting Physician (General Surgery) Lonie Peak, MD as Consulting Physician (Radiation Oncology)  DIAGNOSIS:  Encounter Diagnosis  Name Primary?   Malignant neoplasm of upper-outer quadrant of right breast in female, estrogen receptor negative (HCC) Yes    SUMMARY OF ONCOLOGIC HISTORY: Oncology History  Malignant neoplasm of upper-outer quadrant of right breast in female, estrogen receptor negative (HCC)  05/14/2022 Initial Diagnosis   Screening mammogram detected bilateral breast abnormalities. 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%, PR 0%, HER2 negative, Ki-67 less than 1%), 1.4 cm mass (not biopsied) axilla negative Right breast: Calcifications 3 cm biopsy: Grade 2 IDC with DCIS ER 0%, PR 0%, HER2 negative, Ki-67 0%, axilla negative, additional distortion to be biopsied today   06/02/2022 Genetic Testing   [facility] CancerNext-Expanded Panel+RNA was Negative. Report date is 06/03/2022.  The CancerNext-Expanded gene panel offered by [facility] and includes sequencing, rearrangement, and RNA analysis for the following 77 genes: AIP, ALK, APC, ATM, AXIN2, BAP1, BARD1, BLM, BMPR1A, BRCA1, BRCA2, BRIP1, CDC73, CDH1, CDK4, CDKN1B, CDKN2A, CHEK2, CTNNA1, DICER1, FANCC, FH, FLCN, GALNT12, KIF1B, LZTR1, MAX, MEN1, MET, MLH1, MSH2, MSH3, MSH6, MUTYH, NBN, NF1, NF2, NTHL1, PALB2, PHOX2B, PMS2, POT1, PRKAR1A, PTCH1, PTEN, RAD51C, RAD51D, RB1, RECQL, RET, SDHA, SDHAF2, SDHB, SDHC, SDHD, SMAD4, SMARCA4, SMARCB1, SMARCE1, STK11, SUFU, TMEM127, TP53, TSC1, TSC2,  VHL and XRCC2 (sequencing and deletion/duplication); EGFR, EGLN1, HOXB13, KIT, MITF, PDGFRA, POLD1, and POLE (sequencing only); EPCAM and GREM1 (deletion/duplication only).     07/19/2022 Surgery   Bilateral mastectomies:  Left mastectomy: Grade 2 IDC 6.8 cm with intermediate grade DCIS, margins negative, ER 100%, PR 5%, HER2 negative (2+ IHC, FISH neg), Ki-67 less than 1% 0/5 lymph nodes;  Right mastectomy: Grade 2 IDC 8.5 cm and 1.5 cm with high-grade DCIS, margins negative, ER 95% (on biopsy it was read as negative), PR 0%, HER2 negative, Ki-67 15% 0/3 lymph nodes   08/20/2022 - 10/22/2022 Chemotherapy   Patient is on Treatment Plan : BREAST Adjuvant CMF IV q21d     02/08/2023 - 03/15/2023 Radiation Therapy   Adjuvant radiation   Malignant neoplasm of overlapping sites of left breast in female, estrogen receptor positive (HCC)  05/24/2022 Initial Diagnosis   Malignant neoplasm of overlapping sites of left breast in female, estrogen receptor positive (HCC)   05/26/2022 Cancer Staging   Staging form: Breast, AJCC 8th Edition - Clinical: Stage IB (cT2, cN0, cM0, G2, ER+, PR+, HER2-) - Signed by Serena Croissant, MD on 05/26/2022 Histologic grading system: 3 grade system   06/02/2022 Genetic Testing   [facility] CancerNext-Expanded Panel+RNA was Negative. Report date is 06/03/2022.  The CancerNext-Expanded gene panel offered by [facility] and includes sequencing, rearrangement, and RNA analysis for the following 77 genes: AIP, ALK, APC, ATM, AXIN2, BAP1, BARD1, BLM, BMPR1A, BRCA1, BRCA2, BRIP1, CDC73, CDH1, CDK4, CDKN1B, CDKN2A, CHEK2, CTNNA1, DICER1, FANCC, FH, FLCN, GALNT12, KIF1B, LZTR1, MAX, MEN1, MET, MLH1, MSH2, MSH3, MSH6, MUTYH, NBN, NF1, NF2, NTHL1, PALB2, PHOX2B, PMS2, POT1, PRKAR1A, PTCH1, PTEN, RAD51C, RAD51D, RB1, RECQL, RET, SDHA, SDHAF2, SDHB, SDHC, SDHD, SMAD4, SMARCA4, SMARCB1, SMARCE1, STK11, SUFU,  TMEM127, TP53, TSC1, TSC2, VHL and XRCC2 (sequencing and deletion/duplication); EGFR,  EGLN1, HOXB13, KIT, MITF, PDGFRA, POLD1, and POLE (sequencing only); EPCAM and GREM1 (deletion/duplication only).     08/20/2022 - 10/22/2022 Chemotherapy   Patient is on Treatment Plan : BREAST Adjuvant CMF IV q21d       CHIEF COMPLIANT: Follow-up after radiation    History of Present Illness   The patient, with a history of breast cancer, recently completed radiation therapy. She reports skin changes and discomfort at the radiation site, but overall, she tolerated the treatment well. She has been experiencing hot flashes, which may be exacerbated by the upcoming hormone therapy.. She has been on a blood thinner due to a previous blood clot, but this medication is due to be stopped at the end of the month. The patient has lost weight, which she attributes to changes in her taste buds.        ALLERGIES:  is allergic to codeine phosphate, lipitor [atorvastatin], simvastatin, sulfamethoxazole, and tape.  MEDICATIONS:  Current Outpatient Medications  Medication Sig Dispense Refill   anastrozole (ARIMIDEX) 1 MG tablet Take 1 tablet (1 mg total) by mouth daily. 90 tablet 3   albuterol (VENTOLIN HFA) 108 (90 Base) MCG/ACT inhaler Inhale 2 puffs into the lungs every 6 (six) hours as needed for wheezing or shortness of breath. 6.7 g 0   ALPRAZolam (XANAX) 0.5 MG tablet Take 0.5 mg by mouth 2 (two) times daily as needed for sleep or anxiety.  2   amitriptyline (ELAVIL) 50 MG tablet Take 50 mg by mouth at bedtime.     amLODipine (NORVASC) 10 MG tablet Take 10 mg by mouth daily.     apixaban (ELIQUIS) 5 MG TABS tablet Take 1 tablet (5 mg total) by mouth 2 (two) times daily. 60 tablet 5   ezetimibe (ZETIA) 10 MG tablet Take 10 mg by mouth daily.     LIQUID ALLERGY RELIEF 12.5 MG/5ML liquid Take by mouth.     loratadine (CLARITIN) 10 MG tablet Take 10 mg by mouth daily.     metFORMIN (GLUCOPHAGE-XR) 500 MG 24 hr tablet Take 1,000 mg by mouth 2 (two) times daily.     MOUNJARO 5 MG/0.5ML Pen Inject 5 mg  into the skin every Monday.     olmesartan (BENICAR) 40 MG tablet Take 40 mg by mouth daily.     oxyCODONE (ROXICODONE) 5 MG immediate release tablet Take 1 tablet (5 mg total) by mouth every 6 (six) hours as needed for severe pain. 5 tablet 0   oxyCODONE ER (XTAMPZA ER) 36 MG C12A Take 36 mg by mouth in the morning and at bedtime.     oxyCODONE ER (XTAMPZA ER) 36 MG C12A Take 1 capsule (36 mg total) by mouth every 12 (twelve) hours. 60 capsule 0   oxyCODONE ER (XTAMPZA ER) 36 MG C12A Take 1 capsule (36 mg total) by mouth every 12 (twelve) hours. 60 capsule 0   oxyCODONE ER (XTAMPZA ER) 36 MG C12A Take 1 capsule (36 mg total) by mouth every 12 (twelve) hours. 60 capsule 0   oxyCODONE ER (XTAMPZA ER) 36 MG C12A Take 1 capsule (36 mg total) by mouth every 12 (twelve) hours. 60 capsule 0   pantoprazole (PROTONIX) 40 MG tablet Take 40 mg by mouth daily.     Pediatric Multivitamins-Iron (FLINTSTONES PLUS EXTRA IRON PO) Take 1 tablet by mouth daily.     pregabalin (LYRICA) 100 MG capsule Take 100 mg by mouth 2 (two)  times daily.     pregabalin (LYRICA) 100 MG capsule Take 1 capsule (100 mg total) by mouth 2 (two) times daily. 60 capsule 1   promethazine (PHENERGAN) 25 MG tablet TAKE 1 TABLET BY MOUTH EVERY 6 HOURS AS NEEDED FOR NAUSEA OR VOMITING. 30 tablet 0   sodium chloride flush 0.9 % SOLN injection Flush the drain with 5 mL every day 300 mL 3   No current facility-administered medications for this visit.    PHYSICAL EXAMINATION: ECOG PERFORMANCE STATUS: 1 - Symptomatic but completely ambulatory  Vitals:   03/16/23 1113  Pulse: 90  Resp: 18  Temp: (!) 97.3 F (36.3 C)  SpO2: 96%   Filed Weights   03/16/23 1113  Weight: 199 lb 9.6 oz (90.5 kg)    Physical Exam   MEASUREMENTS: WT- below 200     LABORATORY DATA:  I have reviewed the data as listed    Latest Ref Rng & Units 01/10/2023   10:30 AM 11/19/2022    7:56 AM 11/13/2022    3:40 AM  CMP  Glucose 70 - 99 mg/dL 865  784  696    BUN 8 - 23 mg/dL 20  25  14    Creatinine 0.44 - 1.00 mg/dL 2.95  2.84  1.32   Sodium 135 - 145 mmol/L 134  137  136   Potassium 3.5 - 5.1 mmol/L 4.8  4.5  4.0   Chloride 98 - 111 mmol/L 97  101  101   CO2 22 - 32 mmol/L 25  26  22    Calcium 8.9 - 10.3 mg/dL 9.5  9.5  8.7   Total Protein 6.5 - 8.1 g/dL  6.9  6.1   Total Bilirubin 0.3 - 1.2 mg/dL  0.3  0.7   Alkaline Phos 38 - 126 U/L  75  76   AST 15 - 41 U/L  16  20   ALT 0 - 44 U/L  16  13     Lab Results  Component Value Date   WBC 8.0 11/19/2022   HGB 10.7 (L) 11/19/2022   HCT 34.7 (L) 11/19/2022   MCV 94.3 11/19/2022   PLT 210 11/19/2022   NEUTROABS 6.3 11/19/2022    ASSESSMENT & PLAN:  Malignant neoplasm of upper-outer quadrant of right breast in female, estrogen receptor negative (HCC) Bilateral breast cancers Screening mammogram detected bilateral breast abnormalities. 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%, PR 0%, HER2 negative, Ki-67 less than 1%), 1.4 cm mass (not biopsied) axilla negative Right breast: Calcifications 3 cm biopsy: Grade 2 IDC with DCIS ER 0%, PR 0%, HER2 negative, Ki-67 0%, axilla negative, additional distortion to be biopsied today   Treatment plan: 07/19/2022: Bilateral mastectomies:  Left mastectomy: Grade 2 IDC 6.8 cm with intermediate grade DCIS, margins negative, ER 100%, PR 5%, HER2 negative (2+ IHC, FISH neg), Ki-67 less than 1% 0/5 lymph nodes;  Right mastectomy: Grade 2 IDC 8.5 cm and 1.5 cm with high-grade DCIS, margins negative, ER 95% (on biopsy it was read as negative), PR 0%, HER2 negative, Ki-67 15% 0/3 lymph nodes Adjuvant chemotherapy CMF x 4 cycles (selected because of her performance status) started 08/20/2022-10/22/2022 discontinued for febrile neutropenia and sepsis Postmastectomy radiation  02/08/2023-03/15/2023 Antiestrogen therapy started 03/29/2023   Patient needs an extremely unhealthy diet rich in cholesterol and  fats and sugar.  She does not seem to have any interest in changing her diet. ------------------------------------------------------------------------------------------------------------------- Anastrozole counseling: We discussed the risks and benefits of anti-estrogen therapy with aromatase inhibitors. These include but not limited to insomnia, hot flashes, mood changes, vaginal dryness, bone density loss, and weight gain. We strongly believe that the benefits far outweigh the risks. Patient understands these risks and consented to starting treatment. Planned treatment duration is 7 years.       Deep Vein Thrombosis On blood thinner due to previous blood clot in leg. Discussed the duration of anticoagulation therapy. -Stop blood thinner at the end of November 2024.  Weight Loss Noted weight loss below 200 lbs. Discussed potential reasons including improved eating habits and altered taste buds. -Encourage continued healthy eating habits.  Follow-up in 3 months with nurse practitioner Lillia Abed for survivorship care plan visit         No orders of the defined types were placed in this encounter.  The patient has a good understanding of the overall plan. she agrees with it. she will call with any problems that may develop before the next visit here. Total time spent: 30 mins including face to face time and time spent for planning, charting and co-ordination of care   Tamsen Meek, MD 03/16/23

## 2023-03-16 NOTE — Radiation Completion Notes (Signed)
Patient Name: [name], [name] MRN: [id number] Date of Birth: 02/09/1961 Referring Physician: Serena Croissant, M.D. Date of Service: 2023-03-16 Radiation Oncologist: Lonie Peak, M.D. [facility] [place]                             RADIATION ONCOLOGY END OF TREATMENT NOTE     Diagnosis: C50.411 Malignant neoplasm of upper-outer quadrant of right female breast; C50.812 Malignant neoplasm of overlapping sites of left female breast Staging on 2022-05-26: Malignant neoplasm of upper-outer quadrant of right breast in female, estrogen receptor negative (HCC) T=cT2, N=cN0, M=cM0 Staging on 2022-05-26: Malignant neoplasm of overlapping sites of left breast in female, estrogen receptor positive (HCC) T=cT2, N=cN0, M=cM0 Intent: Curative     ==========DELIVERED PLANS==========  First Treatment Date: 2023-02-07 - Last Treatment Date: 2023-03-15   Plan Name: CW_R_BO Site: Chest Wall, Right Technique: 3D Mode: Photon Dose Per Fraction: 2 Gy Prescribed Dose (Delivered / Prescribed): 26 Gy / 26 Gy Prescribed Fxs (Delivered / Prescribed): 13 / 13   Plan Name: CW_R Site: Chest Wall, Right Technique: 3D Mode: Photon Dose Per Fraction: 2 Gy Prescribed Dose (Delivered / Prescribed): 24 Gy / 24 Gy Prescribed Fxs (Delivered / Prescribed): 12 / 12   Plan Name: CW_L_BO Site: Chest Wall, Left Technique: 3D Mode: Photon Dose Per Fraction: 2 Gy Prescribed Dose (Delivered / Prescribed): 24 Gy / 26 Gy Prescribed Fxs (Delivered / Prescribed): 12 / 13   Plan Name: CW_L Site: Chest Wall, Left Technique: 3D Mode: Photon Dose Per Fraction: 2 Gy Prescribed Dose (Delivered / Prescribed): 24 Gy / 24 Gy Prescribed Fxs (Delivered / Prescribed): 12 / 12   Plan Name: CW_L:1 Site: Chest Wall, Left Technique: 3D Mode: Photon Dose Per Fraction: 2 Gy Prescribed Dose (Delivered / Prescribed): 2 Gy / 2 Gy Prescribed Fxs (Delivered / Prescribed): 1 / 1     ==========ON TREATMENT VISIT  DATES========== 2023-02-07, 2023-02-15, 2023-02-22, 2023-02-28, 2023-03-07, 2023-03-14     ==========UPCOMING VISITS==========       ==========APPENDIX - ON TREATMENT VISIT NOTES==========   See weekly On Treatment Notes in Epic for details.

## 2023-03-16 NOTE — Assessment & Plan Note (Signed)
Bilateral breast cancers Screening mammogram detected bilateral breast abnormalities. 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%, PR 0%, HER2 negative, Ki-67 less than 1%), 1.4 cm mass (not biopsied) axilla negative Right breast: Calcifications 3 cm biopsy: Grade 2 IDC with DCIS ER 0%, PR 0%, HER2 negative, Ki-67 0%, axilla negative, additional distortion to be biopsied today   Treatment plan: 07/19/2022: Bilateral mastectomies:  Left mastectomy: Grade 2 IDC 6.8 cm with intermediate grade DCIS, margins negative, ER 100%, PR 5%, HER2 negative (2+ IHC, FISH neg), Ki-67 less than 1% 0/5 lymph nodes;  Right mastectomy: Grade 2 IDC 8.5 cm and 1.5 cm with high-grade DCIS, margins negative, ER 95% (on biopsy it was read as negative), PR 0%, HER2 negative, Ki-67 15% 0/3 lymph nodes Adjuvant chemotherapy CMF x 4 cycles (selected because of her performance status) started 08/20/2022-10/22/2022 discontinued for febrile neutropenia and sepsis Postmastectomy radiation 02/08/2023-03/15/2023 Antiestrogen therapy started 03/29/2023   Patient needs an extremely unhealthy diet rich in cholesterol and fats and sugar.  She does not seem to have any interest in changing her diet. ------------------------------------------------------------------------------------------------------------------- Anastrozole counseling: We discussed the risks and benefits of anti-estrogen therapy with aromatase inhibitors. These include but not limited to insomnia, hot flashes, mood changes, vaginal dryness, bone density loss, and weight gain. We strongly believe that the benefits far outweigh the risks. Patient understands these risks and consented to starting treatment. Planned treatment duration is 7 years.   Return to clinic in 3 months for survivorship care plan visit

## 2023-03-18 ENCOUNTER — Encounter: Payer: Self-pay | Admitting: Hematology and Oncology

## 2023-03-28 ENCOUNTER — Other Ambulatory Visit (HOSPITAL_COMMUNITY): Payer: Self-pay

## 2023-03-28 ENCOUNTER — Encounter: Payer: Self-pay | Admitting: Hematology and Oncology

## 2023-03-28 MED ORDER — XTAMPZA ER 36 MG PO C12A
36.0000 mg | EXTENDED_RELEASE_CAPSULE | Freq: Two times a day (BID) | ORAL | 0 refills | Status: DC
Start: 2023-05-12 — End: 2023-03-28

## 2023-03-28 MED ORDER — XTAMPZA ER 36 MG PO C12A
36.0000 mg | EXTENDED_RELEASE_CAPSULE | Freq: Two times a day (BID) | ORAL | 0 refills | Status: DC
Start: 2023-04-13 — End: 2023-03-28

## 2023-04-20 ENCOUNTER — Telehealth: Payer: Self-pay

## 2023-04-20 NOTE — Telephone Encounter (Signed)
  Radiation Oncology         [phone] ________________________________  Name: [name] MRN: [id number]  Date of Service: 04/20/2023  DOB: 1960-11-10  Post Treatment Telephone Note  Diagnosis:  Malignant neoplasm of upper-outer quadrant of right female breast (as documented in provider EOT note)   The patient was available for call today.   Symptoms of fatigue have not improved much since completing therapy, but she feels she is managing well.  Symptoms of skin changes have improved since completing therapy. She reports the areas within the treatment field that blistered have all healed and skin is intact. She reports she is already using a cream that has vitamin E oil in it, and plans to continue to apply it to her breast for the next 2 months.  The patient was encouraged to avoid sun exposure in the area of prior treatment for up to one year following radiation with either sunscreen or by the style of clothing worn in the sun.  The patient has scheduled follow up with her medical oncology team on 07/11/2023 in the [facility] for ongoing surveillance, and was encouraged to call if she develops concerns or questions regarding radiation. Patient verbalized understanding and appreciation of call. Denied any other needs at this time.

## 2023-04-21 ENCOUNTER — Ambulatory Visit: Payer: PRIVATE HEALTH INSURANCE | Admitting: Radiation Oncology

## 2023-04-22 ENCOUNTER — Emergency Department (HOSPITAL_COMMUNITY): Payer: Commercial Managed Care - HMO

## 2023-04-22 ENCOUNTER — Encounter (HOSPITAL_COMMUNITY): Payer: Self-pay

## 2023-04-22 ENCOUNTER — Other Ambulatory Visit: Payer: Self-pay

## 2023-04-22 ENCOUNTER — Encounter: Payer: Self-pay | Admitting: Hematology and Oncology

## 2023-04-22 ENCOUNTER — Inpatient Hospital Stay (HOSPITAL_COMMUNITY)
Admission: EM | Admit: 2023-04-22 | Discharge: 2023-04-24 | DRG: 871 | Disposition: A | Discharge status: Home / Self Care | Payer: Commercial Managed Care - HMO | Attending: Internal Medicine | Admitting: Internal Medicine

## 2023-04-22 DIAGNOSIS — G8929 Other chronic pain: Secondary | ICD-10-CM | POA: Diagnosis present

## 2023-04-22 DIAGNOSIS — Z833 Family history of diabetes mellitus: Secondary | ICD-10-CM

## 2023-04-22 DIAGNOSIS — Z79899 Other long term (current) drug therapy: Secondary | ICD-10-CM

## 2023-04-22 DIAGNOSIS — D631 Anemia in chronic kidney disease: Secondary | ICD-10-CM | POA: Diagnosis present

## 2023-04-22 DIAGNOSIS — Z7984 Long term (current) use of oral hypoglycemic drugs: Secondary | ICD-10-CM

## 2023-04-22 DIAGNOSIS — Z9221 Personal history of antineoplastic chemotherapy: Secondary | ICD-10-CM

## 2023-04-22 DIAGNOSIS — Z853 Personal history of malignant neoplasm of breast: Secondary | ICD-10-CM

## 2023-04-22 DIAGNOSIS — N179 Acute kidney failure, unspecified: Secondary | ICD-10-CM | POA: Diagnosis present

## 2023-04-22 DIAGNOSIS — Z9013 Acquired absence of bilateral breasts and nipples: Secondary | ICD-10-CM | POA: Diagnosis not present

## 2023-04-22 DIAGNOSIS — E875 Hyperkalemia: Secondary | ICD-10-CM | POA: Diagnosis present

## 2023-04-22 DIAGNOSIS — E785 Hyperlipidemia, unspecified: Secondary | ICD-10-CM | POA: Diagnosis present

## 2023-04-22 DIAGNOSIS — J9601 Acute respiratory failure with hypoxia: Secondary | ICD-10-CM | POA: Diagnosis not present

## 2023-04-22 DIAGNOSIS — Z79811 Long term (current) use of aromatase inhibitors: Secondary | ICD-10-CM

## 2023-04-22 DIAGNOSIS — E1122 Type 2 diabetes mellitus with diabetic chronic kidney disease: Secondary | ICD-10-CM | POA: Diagnosis present

## 2023-04-22 DIAGNOSIS — Z885 Allergy status to narcotic agent status: Secondary | ICD-10-CM

## 2023-04-22 DIAGNOSIS — Z923 Personal history of irradiation: Secondary | ICD-10-CM | POA: Diagnosis not present

## 2023-04-22 DIAGNOSIS — R4182 Altered mental status, unspecified: Secondary | ICD-10-CM

## 2023-04-22 DIAGNOSIS — R6521 Severe sepsis with septic shock: Secondary | ICD-10-CM | POA: Diagnosis present

## 2023-04-22 DIAGNOSIS — Z8249 Family history of ischemic heart disease and other diseases of the circulatory system: Secondary | ICD-10-CM

## 2023-04-22 DIAGNOSIS — E871 Hypo-osmolality and hyponatremia: Secondary | ICD-10-CM | POA: Diagnosis present

## 2023-04-22 DIAGNOSIS — Z86718 Personal history of other venous thrombosis and embolism: Secondary | ICD-10-CM

## 2023-04-22 DIAGNOSIS — B349 Viral infection, unspecified: Secondary | ICD-10-CM | POA: Diagnosis present

## 2023-04-22 DIAGNOSIS — Z803 Family history of malignant neoplasm of breast: Secondary | ICD-10-CM

## 2023-04-22 DIAGNOSIS — N170 Acute kidney failure with tubular necrosis: Secondary | ICD-10-CM | POA: Diagnosis present

## 2023-04-22 DIAGNOSIS — R0902 Hypoxemia: Secondary | ICD-10-CM | POA: Diagnosis present

## 2023-04-22 DIAGNOSIS — F419 Anxiety disorder, unspecified: Secondary | ICD-10-CM | POA: Diagnosis present

## 2023-04-22 DIAGNOSIS — E669 Obesity, unspecified: Secondary | ICD-10-CM | POA: Diagnosis present

## 2023-04-22 DIAGNOSIS — A419 Sepsis, unspecified organism: Principal | ICD-10-CM | POA: Diagnosis present

## 2023-04-22 DIAGNOSIS — E1165 Type 2 diabetes mellitus with hyperglycemia: Secondary | ICD-10-CM

## 2023-04-22 DIAGNOSIS — E860 Dehydration: Secondary | ICD-10-CM | POA: Diagnosis present

## 2023-04-22 DIAGNOSIS — Z7901 Long term (current) use of anticoagulants: Secondary | ICD-10-CM

## 2023-04-22 DIAGNOSIS — Z7985 Long-term (current) use of injectable non-insulin antidiabetic drugs: Secondary | ICD-10-CM

## 2023-04-22 DIAGNOSIS — I129 Hypertensive chronic kidney disease with stage 1 through stage 4 chronic kidney disease, or unspecified chronic kidney disease: Secondary | ICD-10-CM | POA: Diagnosis present

## 2023-04-22 DIAGNOSIS — J4489 Other specified chronic obstructive pulmonary disease: Secondary | ICD-10-CM | POA: Diagnosis present

## 2023-04-22 DIAGNOSIS — Z91048 Other nonmedicinal substance allergy status: Secondary | ICD-10-CM

## 2023-04-22 DIAGNOSIS — Z87891 Personal history of nicotine dependence: Secondary | ICD-10-CM | POA: Diagnosis not present

## 2023-04-22 DIAGNOSIS — N1831 Chronic kidney disease, stage 3a: Secondary | ICD-10-CM | POA: Diagnosis present

## 2023-04-22 DIAGNOSIS — A4189 Other specified sepsis: Secondary | ICD-10-CM | POA: Diagnosis present

## 2023-04-22 DIAGNOSIS — Z807 Family history of other malignant neoplasms of lymphoid, hematopoietic and related tissues: Secondary | ICD-10-CM

## 2023-04-22 DIAGNOSIS — Z1152 Encounter for screening for COVID-19: Secondary | ICD-10-CM | POA: Diagnosis not present

## 2023-04-22 DIAGNOSIS — R6520 Severe sepsis without septic shock: Principal | ICD-10-CM

## 2023-04-22 DIAGNOSIS — G9341 Metabolic encephalopathy: Secondary | ICD-10-CM | POA: Diagnosis present

## 2023-04-22 DIAGNOSIS — K219 Gastro-esophageal reflux disease without esophagitis: Secondary | ICD-10-CM | POA: Diagnosis present

## 2023-04-22 DIAGNOSIS — Z6834 Body mass index (BMI) 34.0-34.9, adult: Secondary | ICD-10-CM

## 2023-04-22 DIAGNOSIS — J9621 Acute and chronic respiratory failure with hypoxia: Secondary | ICD-10-CM | POA: Diagnosis not present

## 2023-04-22 DIAGNOSIS — Z888 Allergy status to other drugs, medicaments and biological substances status: Secondary | ICD-10-CM

## 2023-04-22 DIAGNOSIS — Z880 Allergy status to penicillin: Secondary | ICD-10-CM

## 2023-04-22 LAB — COMPREHENSIVE METABOLIC PANEL
ALT: 29 U/L (ref 0–44)
AST: 22 U/L (ref 15–41)
Albumin: 3.3 g/dL — ABNORMAL LOW (ref 3.5–5.0)
Alkaline Phosphatase: 71 U/L (ref 38–126)
Anion gap: 15 (ref 5–15)
BUN: 42 mg/dL — ABNORMAL HIGH (ref 8–23)
CO2: 19 mmol/L — ABNORMAL LOW (ref 22–32)
Calcium: 8.3 mg/dL — ABNORMAL LOW (ref 8.9–10.3)
Chloride: 103 mmol/L (ref 98–111)
Creatinine, Ser: 3.79 mg/dL — ABNORMAL HIGH (ref 0.44–1.00)
GFR, Estimated: 13 mL/min — ABNORMAL LOW (ref >60)
Glucose, Bld: 175 mg/dL — ABNORMAL HIGH (ref 70–99)
Potassium: 5.5 mmol/L — ABNORMAL HIGH (ref 3.5–5.1)
Sodium: 137 mmol/L (ref 135–145)
Total Bilirubin: 0.5 mg/dL (ref <1.2)
Total Protein: 6.9 g/dL (ref 6.5–8.1)

## 2023-04-22 LAB — CBC WITH DIFFERENTIAL/PLATELET
Abs Immature Granulocytes: 0.04 10*3/uL (ref 0.00–0.07)
Basophils Absolute: 0 10*3/uL (ref 0.0–0.1)
Basophils Relative: 0 %
Eosinophils Absolute: 0.1 10*3/uL (ref 0.0–0.5)
Eosinophils Relative: 1 %
HCT: 38.2 % (ref 36.0–46.0)
Hemoglobin: 11.5 g/dL — ABNORMAL LOW (ref 12.0–15.0)
Immature Granulocytes: 1 %
Lymphocytes Relative: 5 %
Lymphs Abs: 0.4 10*3/uL — ABNORMAL LOW (ref 0.7–4.0)
MCH: 27.2 pg (ref 26.0–34.0)
MCHC: 30.1 g/dL (ref 30.0–36.0)
MCV: 90.3 fL (ref 80.0–100.0)
Monocytes Absolute: 0.4 10*3/uL (ref 0.1–1.0)
Monocytes Relative: 6 %
Neutro Abs: 5.8 10*3/uL (ref 1.7–7.7)
Neutrophils Relative %: 87 %
Platelets: 232 10*3/uL (ref 150–400)
RBC: 4.23 MIL/uL (ref 3.87–5.11)
RDW: 17.3 % — ABNORMAL HIGH (ref 11.5–15.5)
WBC: 6.7 10*3/uL (ref 4.0–10.5)
nRBC: 0 % (ref 0.0–0.2)

## 2023-04-22 LAB — HEMOGLOBIN A1C
Hgb A1c MFr Bld: 7.8 % — ABNORMAL HIGH (ref 4.8–5.6)
Mean Plasma Glucose: 177.16 mg/dL

## 2023-04-22 LAB — CBG MONITORING, ED: Glucose-Capillary: 167 mg/dL — ABNORMAL HIGH (ref 70–99)

## 2023-04-22 LAB — RESPIRATORY PANEL BY PCR

## 2023-04-22 LAB — PROCALCITONIN: Procalcitonin: 0.5 ng/mL

## 2023-04-22 LAB — URINALYSIS, W/ REFLEX TO CULTURE (INFECTION SUSPECTED)
Bilirubin Urine: NEGATIVE
Glucose, UA: NEGATIVE mg/dL
Hgb urine dipstick: NEGATIVE
Ketones, ur: NEGATIVE mg/dL
Nitrite: NEGATIVE
Protein, ur: NEGATIVE mg/dL
Specific Gravity, Urine: 1.025 (ref 1.005–1.030)
pH: 5 (ref 5.0–8.0)

## 2023-04-22 LAB — I-STAT VENOUS BLOOD GAS, ED
Acid-base deficit: 7 mmol/L — ABNORMAL HIGH (ref 0.0–2.0)
Bicarbonate: 20.3 mmol/L (ref 20.0–28.0)
Calcium, Ion: 1.14 mmol/L — ABNORMAL LOW (ref 1.15–1.40)
HCT: 36 % (ref 36.0–46.0)
Hemoglobin: 12.2 g/dL (ref 12.0–15.0)
O2 Saturation: 87 %
Potassium: 5.5 mmol/L — ABNORMAL HIGH (ref 3.5–5.1)
Sodium: 136 mmol/L (ref 135–145)
TCO2: 22 mmol/L (ref 22–32)
pCO2, Ven: 46.3 mm[Hg] (ref 44–60)
pH, Ven: 7.251 (ref 7.25–7.43)
pO2, Ven: 61 mm[Hg] — ABNORMAL HIGH (ref 32–45)

## 2023-04-22 LAB — I-STAT CHEM 8, ED
BUN: 43 mg/dL — ABNORMAL HIGH (ref 8–23)
Calcium, Ion: 1.13 mmol/L — ABNORMAL LOW (ref 1.15–1.40)
Chloride: 107 mmol/L (ref 98–111)
Creatinine, Ser: 3.9 mg/dL — ABNORMAL HIGH (ref 0.44–1.00)
Glucose, Bld: 175 mg/dL — ABNORMAL HIGH (ref 70–99)
HCT: 37 % (ref 36.0–46.0)
Hemoglobin: 12.6 g/dL (ref 12.0–15.0)
Potassium: 5.5 mmol/L — ABNORMAL HIGH (ref 3.5–5.1)
Sodium: 137 mmol/L (ref 135–145)
TCO2: 21 mmol/L — ABNORMAL LOW (ref 22–32)

## 2023-04-22 LAB — I-STAT CG4 LACTIC ACID, ED
Lactic Acid, Venous: 2 mmol/L (ref 0.5–1.9)
Lactic Acid, Venous: 1.3 mmol/L (ref 0.5–1.9)

## 2023-04-22 LAB — BASIC METABOLIC PANEL
Anion gap: 13 (ref 5–15)
BUN: 39 mg/dL — ABNORMAL HIGH (ref 8–23)
CO2: 19 mmol/L — ABNORMAL LOW (ref 22–32)
Calcium: 8.2 mg/dL — ABNORMAL LOW (ref 8.9–10.3)
Chloride: 102 mmol/L (ref 98–111)
Creatinine, Ser: 3.06 mg/dL — ABNORMAL HIGH (ref 0.44–1.00)
GFR, Estimated: 16 mL/min — ABNORMAL LOW (ref >60)
Glucose, Bld: 131 mg/dL — ABNORMAL HIGH (ref 70–99)
Potassium: 5.6 mmol/L — ABNORMAL HIGH (ref 3.5–5.1)
Sodium: 134 mmol/L — ABNORMAL LOW (ref 135–145)

## 2023-04-22 LAB — RESP PANEL BY RT-PCR (RSV, FLU A&B, COVID)  RVPGX2
Influenza A by PCR: NEGATIVE
Influenza B by PCR: NEGATIVE
Resp Syncytial Virus by PCR: NEGATIVE
SARS Coronavirus 2 by RT PCR: NEGATIVE

## 2023-04-22 LAB — STREP PNEUMONIAE URINARY ANTIGEN: Strep Pneumo Urinary Antigen: NEGATIVE

## 2023-04-22 LAB — PROTIME-INR
INR: 1.4 — ABNORMAL HIGH (ref 0.8–1.2)
Prothrombin Time: 16.9 s — ABNORMAL HIGH (ref 11.4–15.2)

## 2023-04-22 LAB — MRSA NEXT GEN BY PCR, NASAL: MRSA by PCR Next Gen: NOT DETECTED

## 2023-04-22 LAB — GLUCOSE, CAPILLARY
Glucose-Capillary: 155 mg/dL — ABNORMAL HIGH (ref 70–99)
Glucose-Capillary: 147 mg/dL — ABNORMAL HIGH (ref 70–99)
Glucose-Capillary: 162 mg/dL — ABNORMAL HIGH (ref 70–99)

## 2023-04-22 LAB — AMMONIA: Ammonia: 22 umol/L (ref 9–35)

## 2023-04-22 LAB — BRAIN NATRIURETIC PEPTIDE: B Natriuretic Peptide: 51.1 pg/mL (ref 0.0–100.0)

## 2023-04-22 LAB — APTT: aPTT: 40 s — ABNORMAL HIGH (ref 24–36)

## 2023-04-22 MED ORDER — CHLORHEXIDINE GLUCONATE CLOTH 2 % EX PADS
6.0000 | MEDICATED_PAD | Freq: Every day | CUTANEOUS | Status: DC
  Administered 2023-04-23: 6 via TOPICAL

## 2023-04-22 MED ORDER — ACETAMINOPHEN 650 MG RE SUPP
650.0000 mg | Freq: Once | RECTAL | Status: AC
Start: 2023-04-22 — End: 2023-04-22
  Administered 2023-04-22: 650 mg via RECTAL
  Filled 2023-04-22: qty 1

## 2023-04-22 MED ORDER — ALPRAZOLAM 0.5 MG PO TABS
0.2500 mg | ORAL_TABLET | Freq: Every evening | ORAL | Status: DC | PRN
  Administered 2023-04-22 – 2023-04-23 (×2): 0.25 mg via ORAL
  Filled 2023-04-22 (×2): qty 1

## 2023-04-22 MED ORDER — SODIUM CHLORIDE 0.9 % IV SOLN: INTRAVENOUS | Status: DC

## 2023-04-22 MED ORDER — OXYCODONE HCL 5 MG PO TABS
10.0000 mg | ORAL_TABLET | Freq: Four times a day (QID) | ORAL | Status: DC | PRN
  Administered 2023-04-22 – 2023-04-24 (×4): 10 mg via ORAL
  Filled 2023-04-22 (×4): qty 2

## 2023-04-22 MED ORDER — PIPERACILLIN-TAZOBACTAM 3.375 G IVPB 30 MIN
3.3750 g | Freq: Once | INTRAVENOUS | Status: AC
  Administered 2023-04-22: 3.375 g via INTRAVENOUS
  Filled 2023-04-22: qty 50

## 2023-04-22 MED ORDER — AMITRIPTYLINE HCL 10 MG PO TABS
10.0000 mg | ORAL_TABLET | Freq: Every day | ORAL | Status: DC
Start: 2023-04-22 — End: 2023-04-24
  Administered 2023-04-22 – 2023-04-23 (×2): 10 mg via ORAL
  Filled 2023-04-22 (×3): qty 1

## 2023-04-22 MED ORDER — DOCUSATE SODIUM 100 MG PO CAPS: 100.0000 mg | ORAL_CAPSULE | Freq: Two times a day (BID) | ORAL | Status: DC | PRN

## 2023-04-22 MED ORDER — INSULIN ASPART 100 UNIT/ML IJ SOLN
0.0000 [IU] | INTRAMUSCULAR | Status: DC
  Administered 2023-04-22: 2 [IU] via SUBCUTANEOUS

## 2023-04-22 MED ORDER — INSULIN ASPART 100 UNIT/ML IJ SOLN: 0.0000 [IU] | Freq: Every day | INTRAMUSCULAR | Status: DC

## 2023-04-22 MED ORDER — LACTATED RINGERS IV BOLUS (SEPSIS)
1000.0000 mL | Freq: Once | INTRAVENOUS | Status: AC
Start: 2023-04-22 — End: 2023-04-22
  Administered 2023-04-22: 1000 mL via INTRAVENOUS

## 2023-04-22 MED ORDER — LACTATED RINGERS IV SOLN: INTRAVENOUS | Status: DC

## 2023-04-22 MED ORDER — PANTOPRAZOLE SODIUM 40 MG PO TBEC
40.0000 mg | DELAYED_RELEASE_TABLET | Freq: Every day | ORAL | Status: DC
  Administered 2023-04-22 – 2023-04-24 (×3): 40 mg via ORAL
  Filled 2023-04-22 (×3): qty 1

## 2023-04-22 MED ORDER — CARMEX CLASSIC LIP BALM EX OINT
TOPICAL_OINTMENT | CUTANEOUS | Status: DC | PRN
  Filled 2023-04-22: qty 10

## 2023-04-22 MED ORDER — POLYETHYLENE GLYCOL 3350 17 G PO PACK: 17.0000 g | PACK | Freq: Every day | ORAL | Status: DC | PRN

## 2023-04-22 MED ORDER — VANCOMYCIN HCL IN DEXTROSE 1-5 GM/200ML-% IV SOLN
1000.0000 mg | Freq: Once | INTRAVENOUS | Status: AC
  Administered 2023-04-22: 1000 mg via INTRAVENOUS
  Filled 2023-04-22: qty 200

## 2023-04-22 MED ORDER — NOREPINEPHRINE 4 MG/250ML-% IV SOLN
0.0000 ug/min | INTRAVENOUS | Status: DC
  Administered 2023-04-22 (×2): 2 ug/min via INTRAVENOUS
  Administered 2023-04-23: 5 ug/min via INTRAVENOUS
  Filled 2023-04-22 (×3): qty 250

## 2023-04-22 MED ORDER — PIPERACILLIN-TAZOBACTAM IN DEX 2-0.25 GM/50ML IV SOLN
2.2500 g | Freq: Four times a day (QID) | INTRAVENOUS | Status: DC
  Administered 2023-04-22 – 2023-04-23 (×4): 2.25 g via INTRAVENOUS
  Filled 2023-04-22 (×5): qty 50

## 2023-04-22 MED ORDER — INSULIN ASPART 100 UNIT/ML IJ SOLN
0.0000 [IU] | Freq: Three times a day (TID) | INTRAMUSCULAR | Status: DC
  Administered 2023-04-22 – 2023-04-24 (×4): 1 [IU] via SUBCUTANEOUS

## 2023-04-22 MED ORDER — PREGABALIN 100 MG PO CAPS
100.0000 mg | ORAL_CAPSULE | Freq: Every day | ORAL | Status: DC
  Administered 2023-04-22 – 2023-04-24 (×3): 100 mg via ORAL
  Filled 2023-04-22 (×2): qty 2
  Filled 2023-04-22: qty 1

## 2023-04-22 MED ORDER — DEXTROSE 5 % IV SOLN
500.0000 mg | INTRAVENOUS | Status: DC
  Administered 2023-04-22 – 2023-04-23 (×2): 500 mg via INTRAVENOUS
  Filled 2023-04-22 (×2): qty 5

## 2023-04-22 MED ORDER — ANASTROZOLE 1 MG PO TABS
1.0000 mg | ORAL_TABLET | Freq: Every day | ORAL | Status: DC
  Administered 2023-04-22 – 2023-04-24 (×3): 1 mg via ORAL
  Filled 2023-04-22 (×3): qty 1

## 2023-04-22 MED ORDER — PREGABALIN 50 MG PO CAPS: 100.0000 mg | ORAL_CAPSULE | Freq: Every day | ORAL | Status: DC

## 2023-04-22 MED ORDER — EZETIMIBE 10 MG PO TABS
10.0000 mg | ORAL_TABLET | Freq: Every day | ORAL | Status: DC
  Administered 2023-04-22 – 2023-04-24 (×3): 10 mg via ORAL
  Filled 2023-04-22 (×3): qty 1

## 2023-04-22 NOTE — Progress Notes (Signed)
eLink Physician-Brief Progress Note Patient Name: [name] DOB: 08-03-1960 MRN: [id number]   Date of Service  04/22/2023  HPI/Events of Note  62 year old female with a history of chronic opiate dependence on OxyContin in the setting of breast cancer status post chemoradiation presented in remission who comes in with poor oral intake and confusion.  Concern for shock, presumed from sepsis  She has 10 out of 10 pain.  No opiates are currently ordered.  Pregabalin to start tomorrow morning.  eICU Interventions  Initiate oxycodone 10 mg every 6 hours as needed  Initiate Lyrica tonight     Intervention Category Intermediate Interventions: Pain - evaluation and management  Essance Gatti 04/22/2023, 8:19 PM

## 2023-04-22 NOTE — ED Notes (Addendum)
ICU provider bedside stated that though pressure is reading low on leg, it may not be accurate, and to hold vasopressors unless arm BP correlates. Also, they stated that we could take intermittent pressures on right arm, but not consistently due to risk of lymphedema

## 2023-04-22 NOTE — ED Notes (Signed)
Tried again to call report to [phone], they advised they were "trying to figure it out".

## 2023-04-22 NOTE — H&P (Signed)
NAME:  [name], MRN:  [id number], DOB:  Sep 14, 1960, LOS: 0 ADMISSION DATE:  04/22/2023, CONSULTATION DATE: 04/22/1999  REFERRING MD: Emergency department, CHIEF COMPLAINT: Hypotension  62 year old female with recent mastectomies that she has completed radiation and recent diarrhea and exposure to sick grandchildren.  She presents with hypotension although there is probably accurate blood pressures in her leg.  Pressures were checked and her arms were 120/70 and when checked on her lower extremities they are systolic of 70.  He said 3 L fluid resuscitation.  He will continue the fluid resuscitation with normal saline at 150 an hour we will avoid vasopressors if possible we will need to check intermittent arm pressures before starting any type of vasopressor support.  It may behoove [place] to place an arterial line just to confirm that blood pressures are accurate  Pertinent  Medical History   Past Medical History:  Diagnosis Date   Anemia    Arthritis    Asthma    Bladder spasms    Breast cancer (HCC)    Diabetes mellitus without complication (HCC)    GERD (gastroesophageal reflux disease)    Heart murmur    Hyperlipidemia    Hypertension      Significant Hospital Events: Including procedures, antibiotic start and stop dates in addition to other pertinent events     Interim History / Subjective:  Admitted with altered mental status  Objective   Blood pressure (!) 95/47, pulse (!) 107, temperature 100.1 F (37.8 C), resp. rate 19, height 5\' 4"  (1.626 m), weight 90.7 kg, last menstrual period 04/22/1997, SpO2 100%.        Intake/Output Summary (Last 24 hours) at 04/22/2023 1010 Last data filed at 04/22/2023 8295 Gross per 24 hour  Intake 3250 ml  Output --  Net 3250 ml   Filed Weights   04/22/23 0747  Weight: 90.7 kg    Examination: General: Obese female no acute distress HENT: No JVD lymphadenopathy is appreciated Lungs: The bases decreased Cardiovascular: Heart  sounds are regular Abdomen: Obese soft nontender Extremities: Warm dry with mild edema Neuro: Somewhat strange aphasic but otherwise intact GU: Foley catheter with dark urine  Resolved Hospital Problem list     Assessment & Plan:  Shock from presumed sepsis in a patient with recent treatment for breast cancer status post bilateral mastectomies completed chemotherapy and just completed radiation.  She denies fevers chills or sweats.  She has had some diarrhea status and hypotension.  Her hypotension is difficult to ascertain due to the fact they are not using her arm.  Her lower extremity and foot pressures are inaccurate.  Therefore Arm pressures before starting any vasopressors Empirical antimicrobial therapy with vancomycin and Zosyn Panculture Consider putting in an arterial line and to confirm her blood pressure status Fluid resuscitation note did her urine dark Will admit to the intensive care unit and she will need sophistication of the ICU to determine what her accurate blood pressure is  Recent treatment for bilateral breast cancer Monitor  Diabetes type 2 CBG (last 3)  Recent Labs    04/22/23 0805  GLUCAP 167*   H sliding-scale insulin protocol   Renal insufficiency Lab Results  Component Value Date   CREATININE 3.90 (H) 04/22/2023   CREATININE 3.79 (H) 04/22/2023   CREATININE 1.10 (H) 01/10/2023   CREATININE 1.19 (H) 11/19/2022   CREATININE 1.21 (H) 10/22/2022   CREATININE 1.02 (H) 10/01/2022  Fluid resuscitation Nephrotoxins        Best Practice (  right click and "Reselect all SmartList Selections" daily)   Diet/type: NPO DVT prophylaxis: not indicated GI prophylaxis: PPI Lines: N/A Foley:  N/A Code Status:  full code Last date of multidisciplinary goals of care discussion tbd Patient and family updated at bedside Labs   CBC: Recent Labs  Lab 04/22/23 0753 04/22/23 0824 04/22/23 0825  WBC 6.7  --   --   NEUTROABS 5.8  --   --   HGB 11.5*  12.2 12.6  HCT 38.2 36.0 37.0  MCV 90.3  --   --   PLT 232  --   --     Basic Metabolic Panel: Recent Labs  Lab 04/22/23 0753 04/22/23 0824 04/22/23 0825  NA 137 136 137  K 5.5* 5.5* 5.5*  CL 103  --  107  CO2 19*  --   --   GLUCOSE 175*  --  175*  BUN 42*  --  43*  CREATININE 3.79*  --  3.90*  CALCIUM 8.3*  --   --    GFR: Estimated Creatinine Clearance: 15.9 mL/min (A) (by C-G formula based on SCr of 3.9 mg/dL (H)). Recent Labs  Lab 04/22/23 0753 04/22/23 0827 04/22/23 1003  WBC 6.7  --   --   LATICACIDVEN  --  2.0* 1.3    Liver Function Tests: Recent Labs  Lab 04/22/23 0753  AST 22  ALT 29  ALKPHOS 71  BILITOT 0.5  PROT 6.9  ALBUMIN 3.3*   No results for input(s): "LIPASE", "AMYLASE" in the last 168 hours. Recent Labs  Lab 04/22/23 0756  AMMONIA 22    ABG    Component Value Date/Time   HCO3 20.3 04/22/2023 0824   TCO2 21 (L) 04/22/2023 0825   ACIDBASEDEF 7.0 (H) 04/22/2023 0824   O2SAT 87 04/22/2023 0824     Coagulation Profile: Recent Labs  Lab 04/22/23 0753  INR 1.4*    Cardiac Enzymes: No results for input(s): "CKTOTAL", "CKMB", "CKMBINDEX", "TROPONINI" in the last 168 hours.  HbA1C: Hgb A1c MFr Bld  Date/Time Value Ref Range Status  10/22/2022 08:45 AM 8.1 (H) 4.8 - 5.6 % Final    Comment:    (NOTE) Pre diabetes:          5.7%-6.4%  Diabetes:              >6.4%  Glycemic control for   <7.0% adults with diabetes   06/19/2022 07:59 PM 6.5 (H) 4.8 - 5.6 % Final    Comment:    (NOTE)         Prediabetes: 5.7 - 6.4         Diabetes: >6.4         Glycemic control for adults with diabetes: <7.0     CBG: Recent Labs  Lab 04/22/23 0805  GLUCAP 167*    Review of Systems:   10 point review of system taken, please see HPI for positives and negatives. Recent diarrhea Recent exposure to grandchildren with stomach virus Completed.  Radiation for breast cancer  Past Medical History:  She,  has a past medical history of  Anemia, Arthritis, Asthma, Bladder spasms, Breast cancer (HCC), Diabetes mellitus without complication (HCC), GERD (gastroesophageal reflux disease), Heart murmur, Hyperlipidemia, and Hypertension.   Surgical History:   Past Surgical History:  Procedure Laterality Date   BACK SURGERY  1989, 1990, 2006   BREAST BIOPSY Left 05/14/2022   [place] LT BREAST BX W LOC DEV 1ST LESION IMG BX SPEC US GUIDE 05/14/2022 [facility]  MAMMOGRAPHY   BREAST BIOPSY Right 05/14/2022   MM RT BREAST BX W LOC DEV 1ST LESION IMAGE BX SPEC STEREO GUIDE 05/14/2022 [facility] MAMMOGRAPHY   BREAST BIOPSY Left 05/14/2022   MM LT BREAST BX W LOC DEV 1ST LESION IMAGE BX SPEC STEREO GUIDE 05/14/2022 [facility] MAMMOGRAPHY   BREAST BIOPSY Right 05/26/2022   MM RT BREAST BX W LOC DEV 1ST LESION IMAGE BX SPEC STEREO GUIDE 05/26/2022 [facility] MAMMOGRAPHY   BREAST BIOPSY Right 05/26/2022   MM RT BREAST BX W LOC DEV EA AD LESION IMG BX SPEC STEREO GUIDE 05/26/2022 [facility] MAMMOGRAPHY   BREAST LUMPECTOMY  06/14/1974   bi-lat , both benign    CHOLECYSTECTOMY  06/14/1996   IR CATHETER TUBE CHANGE  11/26/2022   IR US GUIDANCE  11/26/2022   IR US GUIDE BX ASP/DRAIN  11/12/2022   LEG SURGERY  06/15/2007   work related injury   PARTIAL HYSTERECTOMY     still has ovaries   PORT-A-CATH REMOVAL Right 01/14/2023   Procedure: REMOVAL PORT-A-CATH;  Surgeon: Griselda Miner, MD;  Location: [name];  Service: General;  Laterality: Right;   PORTACATH PLACEMENT Right 08/12/2022   Procedure: INSERTION PORT-A-CATH;  Surgeon: Griselda Miner, MD;  Location: [facility];  Service: General;  Laterality: Right;   SENTINEL NODE BIOPSY Bilateral 07/19/2022   Procedure: BILATERAL SENTINEL NODE BIOPSY;  Surgeon: Griselda Miner, MD;  Location: [facility];  Service: General;  Laterality: Bilateral;   TOTAL MASTECTOMY Bilateral 07/19/2022   Procedure: BILATERAL TOTAL MASTECTOMY;  Surgeon: Griselda Miner, MD;  Location: [facility];  Service: General;  Laterality: Bilateral;      Social History:   reports that she quit smoking about 9 years ago. Her smoking use included cigarettes. She started smoking about 46 years ago. She has a 18.5 pack-year smoking history. She has never used smokeless tobacco. She reports that she does not drink alcohol and does not use drugs.   Family History:  Her family history includes Breast cancer (age of onset: 41) in her sister; Diabetes in her father and mother; Heart disease in her father and mother; Hypertension in her father and mother; Lymphoma in her paternal grandfather. There is no history of Neuropathy.   Allergies Allergies  Allergen Reactions   Codeine Phosphate Other (See Comments)    Gi upset   Lipitor [Atorvastatin]     Joint pain   Simvastatin     REACTION: muscle \\T \ joint pains   Sulfamethoxazole Hives   Tape Rash    Adhesive. Pt states "it takes skin off"  Not even paper tape     Home Medications  Prior to Admission medications   Medication Sig Start Date End Date Taking? Authorizing Provider  albuterol (VENTOLIN HFA) 108 (90 Base) MCG/ACT inhaler Inhale 2 puffs into the lungs every 6 (six) hours as needed for wheezing or shortness of breath. 07/16/20   Ghimire, Werner Lean, MD  ALPRAZolam Prudy Feeler) 0.5 MG tablet Take 0.5 mg by mouth 2 (two) times daily as needed for sleep or anxiety. 02/20/15   [provider]  amitriptyline (ELAVIL) 50 MG tablet Take 50 mg by mouth at bedtime.    [provider]  amLODipine (NORVASC) 10 MG tablet Take 10 mg by mouth daily. 05/05/20   [provider]  anastrozole (ARIMIDEX) 1 MG tablet Take 1 tablet (1 mg total) by mouth daily. 03/16/23   Serena Croissant, MD  apixaban (ELIQUIS) 5 MG TABS tablet Take 1 tablet (5  mg total) by mouth 2 (two) times daily. 12/20/22 06/18/23  Serena Croissant, MD  ezetimibe (ZETIA) 10 MG tablet Take 10 mg by mouth daily.    [provider]  hydrochlorothiazide (HYDRODIURIL) 12.5 MG tablet Take 12.5 mg by mouth daily. 03/01/23    [provider]  LIQUID ALLERGY RELIEF 12.5 MG/5ML liquid Take by mouth. 09/28/22   [provider]  loratadine (CLARITIN) 10 MG tablet Take 10 mg by mouth daily.    [provider]  metFORMIN (GLUCOPHAGE-XR) 500 MG 24 hr tablet Take 1,000 mg by mouth 2 (two) times daily. 06/09/20   [provider]  MOUNJARO 5 MG/0.5ML Pen Inject 5 mg into the skin every Monday. 11/01/22   [provider]  olmesartan (BENICAR) 40 MG tablet Take 40 mg by mouth daily.    [provider]  oxyCODONE (ROXICODONE) 5 MG immediate release tablet Take 1 tablet (5 mg total) by mouth every 6 (six) hours as needed for severe pain. 01/14/23   Griselda Miner, MD  oxyCODONE ER [facility] ER) 36 MG C12A Take 36 mg by mouth in the morning and at bedtime.    [provider]  oxyCODONE ER (XTAMPZA ER) 36 MG C12A Take 1 capsule (36 mg total) by mouth every 12 (twelve) hours. 12/01/22     oxyCODONE ER (XTAMPZA ER) 36 MG C12A Take 1 capsule (36 mg total) by mouth every 12 (twelve) hours. 12/29/22     oxyCODONE ER (XTAMPZA ER) 36 MG C12A Take 1 capsule (36 mg total) by mouth every 12 (twelve) hours. 03/11/23     oxyCODONE ER (XTAMPZA ER) 36 MG C12A Take 1 capsule (36 mg total) by mouth every 12 (twelve) hours. 02/10/23     OXYCONTIN 30 MG 12 hr tablet Take 30 mg by mouth every 12 (twelve) hours. 03/29/23   [provider]  pantoprazole (PROTONIX) 40 MG tablet Take 40 mg by mouth daily.    [provider]  Pediatric Multivitamins-Iron (FLINTSTONES PLUS EXTRA IRON PO) Take 1 tablet by mouth daily.    [provider]  pregabalin (LYRICA) 100 MG capsule Take 100 mg by mouth 2 (two) times daily. 11/14/22   [provider]  pregabalin (LYRICA) 100 MG capsule Take 1 capsule (100 mg total) by mouth 2 (two) times daily. 12/01/22     promethazine (PHENERGAN) 25 MG tablet TAKE 1 TABLET BY MOUTH EVERY 6 HOURS AS NEEDED FOR NAUSEA OR VOMITING. 10/18/22   Serena Croissant,  MD  sodium chloride flush 0.9 % SOLN injection Flush the drain with 5 mL every day 11/24/22   Villa Herb, PA-C     Critical care time: 34 min   Brett Canales Lawrence Mitch ACNP Acute Care Nurse Practitioner [facility] Please consult Amion 04/22/2023, 10:10 AM

## 2023-04-22 NOTE — ED Notes (Signed)
Attempted to call report to [phone] and they advised they would call me back

## 2023-04-22 NOTE — ED Provider Notes (Signed)
[place] EMERGENCY DEPARTMENT AT [facility] Provider Note   CSN: [id number] Arrival date & time: 04/22/23  0730     History  Chief Complaint  Patient presents with   Shortness of Breath   Urinary Retention   oxygen level decreased    [name] is a 62 y.o. female with past medical history of breast cancer, hyperlipidemia hypertension diabetes metabolic encephalopathy presented to emergency room today with 1 day of confusion, urinary retention and low oxygenation.  Patient reports waking up this morning with oxygen of 75% on room air.  Family member at bedside reports patient just finished radiation and chemo for breast cancer, patient is on Eliquis has not missed a dose.  States she has been acting confused and reports possible chills and fever as well.  Patient currently denies shortness of breath, chest pain.  Patient thought she was presenting to emergency room for [facility] removal, although patient had port removal 8/2.    Shortness of Breath      Home Medications Prior to Admission medications   Medication Sig Start Date End Date Taking? Authorizing Provider  albuterol (VENTOLIN HFA) 108 (90 Base) MCG/ACT inhaler Inhale 2 puffs into the lungs every 6 (six) hours as needed for wheezing or shortness of breath. 07/16/20   Ghimire, Werner Lean, MD  ALPRAZolam Prudy Feeler) 0.5 MG tablet Take 0.5 mg by mouth 2 (two) times daily as needed for sleep or anxiety. 02/20/15   [provider]  amitriptyline (ELAVIL) 50 MG tablet Take 50 mg by mouth at bedtime.    [provider]  amLODipine (NORVASC) 10 MG tablet Take 10 mg by mouth daily. 05/05/20   [provider]  anastrozole (ARIMIDEX) 1 MG tablet Take 1 tablet (1 mg total) by mouth daily. 03/16/23   Serena Croissant, MD  apixaban (ELIQUIS) 5 MG TABS tablet Take 1 tablet (5 mg total) by mouth 2 (two) times daily. 12/20/22 06/18/23  Serena Croissant, MD  ezetimibe (ZETIA) 10 MG tablet Take 10 mg by mouth daily.     [provider]  LIQUID ALLERGY RELIEF 12.5 MG/5ML liquid Take by mouth. 09/28/22   [provider]  loratadine (CLARITIN) 10 MG tablet Take 10 mg by mouth daily.    [provider]  metFORMIN (GLUCOPHAGE-XR) 500 MG 24 hr tablet Take 1,000 mg by mouth 2 (two) times daily. 06/09/20   [provider]  MOUNJARO 5 MG/0.5ML Pen Inject 5 mg into the skin every Monday. 11/01/22   [provider]  olmesartan (BENICAR) 40 MG tablet Take 40 mg by mouth daily.    [provider]  oxyCODONE (ROXICODONE) 5 MG immediate release tablet Take 1 tablet (5 mg total) by mouth every 6 (six) hours as needed for severe pain. 01/14/23   Griselda Miner, MD  oxyCODONE ER [facility] ER) 36 MG C12A Take 36 mg by mouth in the morning and at bedtime.    [provider]  oxyCODONE ER (XTAMPZA ER) 36 MG C12A Take 1 capsule (36 mg total) by mouth every 12 (twelve) hours. 12/01/22     oxyCODONE ER (XTAMPZA ER) 36 MG C12A Take 1 capsule (36 mg total) by mouth every 12 (twelve) hours. 12/29/22     oxyCODONE ER (XTAMPZA ER) 36 MG C12A Take 1 capsule (36 mg total) by mouth every 12 (twelve) hours. 03/11/23     oxyCODONE ER (XTAMPZA ER) 36 MG C12A Take 1 capsule (36 mg total) by mouth every 12 (twelve) hours. 02/10/23  pantoprazole (PROTONIX) 40 MG tablet Take 40 mg by mouth daily.    [provider]  Pediatric Multivitamins-Iron (FLINTSTONES PLUS EXTRA IRON PO) Take 1 tablet by mouth daily.    [provider]  pregabalin (LYRICA) 100 MG capsule Take 100 mg by mouth 2 (two) times daily. 11/14/22   [provider]  pregabalin (LYRICA) 100 MG capsule Take 1 capsule (100 mg total) by mouth 2 (two) times daily. 12/01/22     promethazine (PHENERGAN) 25 MG tablet TAKE 1 TABLET BY MOUTH EVERY 6 HOURS AS NEEDED FOR NAUSEA OR VOMITING. 10/18/22   Serena Croissant, MD  sodium chloride flush 0.9 % SOLN injection Flush the drain with 5 mL every day 11/24/22   Lynnette Caffey A, PA-C      Allergies    Codeine phosphate, Lipitor [atorvastatin], Simvastatin, Sulfamethoxazole, and Tape    Review of Systems   Review of Systems  Respiratory:  Positive for shortness of breath.     Physical Exam Updated Vital Signs BP (!) 103/52   Pulse (!) 117   Ht 5\' 4"  (1.626 m)   Wt 90.7 kg   LMP 04/22/1997   SpO2 (!) 67%   BMI 34.33 kg/m  Physical Exam Vitals and nursing note reviewed.  Constitutional:      General: She is in acute distress.     Appearance: She is ill-appearing. She is not toxic-appearing.     Comments: Not answering questions appropriately.  Alert.  Following commands.  HENT:     Head: Normocephalic and atraumatic.  Eyes:     General: No scleral icterus.    Conjunctiva/sclera: Conjunctivae normal.  Cardiovascular:     Rate and Rhythm: Normal rate and regular rhythm.     Pulses: Normal pulses.     Heart sounds: Normal heart sounds.  Pulmonary:     Effort: Pulmonary effort is normal. No respiratory distress.     Breath sounds: Rales present.  Chest:     Chest wall: No tenderness.  Abdominal:     General: Abdomen is flat. Bowel sounds are normal. There is distension.     Palpations: Abdomen is soft. There is no mass.     Tenderness: There is no abdominal tenderness.  Musculoskeletal:     Right lower leg: No edema.     Left lower leg: No edema.  Skin:    General: Skin is warm and dry.     Findings: No lesion.  Neurological:     General: No focal deficit present.     Mental Status: She is alert and oriented to person, place, and time. Mental status is at baseline.     ED Results / Procedures / Treatments   Labs (all labs ordered are listed, but only abnormal results are displayed) Labs Reviewed  COMPREHENSIVE METABOLIC PANEL - Abnormal; Notable for the following components:      Result Value   Potassium 5.5 (*)    CO2 19 (*)    Glucose, Bld 175 (*)    BUN 42 (*)    Creatinine, Ser 3.79 (*)    Calcium 8.3 (*)     Albumin 3.3 (*)    GFR, Estimated 13 (*)    All other components within normal limits  CBC WITH DIFFERENTIAL/PLATELET - Abnormal; Notable for the following components:   Hemoglobin 11.5 (*)    RDW 17.3 (*)    Lymphs Abs 0.4 (*)    All other components within normal limits  PROTIME-INR - Abnormal; Notable  for the following components:   Prothrombin Time 16.9 (*)    INR 1.4 (*)    All other components within normal limits  APTT - Abnormal; Notable for the following components:   aPTT 40 (*)    All other components within normal limits  URINALYSIS, W/ REFLEX TO CULTURE (INFECTION SUSPECTED) - Abnormal; Notable for the following components:   APPearance HAZY (*)    Leukocytes,Ua TRACE (*)    Bacteria, UA RARE (*)    All other components within normal limits  I-STAT CG4 LACTIC ACID, ED - Abnormal; Notable for the following components:   Lactic Acid, Venous 2.0 (*)    All other components within normal limits  I-STAT VENOUS BLOOD GAS, ED - Abnormal; Notable for the following components:   pO2, Ven 61 (*)    Acid-base deficit 7.0 (*)    Potassium 5.5 (*)    Calcium, Ion 1.14 (*)    All other components within normal limits  I-STAT CHEM 8, ED - Abnormal; Notable for the following components:   Potassium 5.5 (*)    BUN 43 (*)    Creatinine, Ser 3.90 (*)    Glucose, Bld 175 (*)    Calcium, Ion 1.13 (*)    TCO2 21 (*)    All other components within normal limits  CBG MONITORING, ED - Abnormal; Notable for the following components:   Glucose-Capillary 167 (*)    All other components within normal limits  RESP PANEL BY RT-PCR (RSV, FLU A&B, COVID)  RVPGX2  CULTURE, BLOOD (ROUTINE X 2)  CULTURE, BLOOD (ROUTINE X 2)  BRAIN NATRIURETIC PEPTIDE  AMMONIA  I-STAT CG4 LACTIC ACID, ED    EKG None  Radiology No results found.  Procedures .Critical Care  Performed by: Smitty Knudsen, PA-C Authorized by: Smitty Knudsen, PA-C   Critical care provider statement:    Critical care  time (minutes):  30   Critical care start time:  04/22/2023 10:51 AM   Critical care was necessary to treat or prevent imminent or life-threatening deterioration of the following conditions:  Circulatory failure, sepsis, shock, respiratory failure and cardiac failure   Critical care was time spent personally by me on the following activities:  Development of treatment plan with patient or surrogate, discussions with consultants, evaluation of patient's response to treatment, examination of patient, ordering and review of laboratory studies, ordering and review of radiographic studies, ordering and performing treatments and interventions, pulse oximetry, re-evaluation of patient's condition and review of old charts   I assumed direction of critical care for this patient from another provider in my specialty: yes     Care discussed with: admitting provider       Medications Ordered in ED Medications  lactated ringers infusion ( Intravenous New Bag/Given 04/22/23 0901)  norepinephrine (LEVOPHED) 4mg  in (0.016 mg/mL) premix infusion (has no administration in time range)  lactated ringers bolus 1,000 mL (0 mLs Intravenous Stopped 04/22/23 0852)    And  lactated ringers bolus 1,000 mL (0 mLs Intravenous Stopped 04/22/23 0852)    And  lactated ringers bolus 1,000 mL (0 mLs Intravenous Stopped 04/22/23 0859)  vancomycin (VANCOCIN) IVPB 1000 mg/200 mL premix (0 mg Intravenous Stopped 04/22/23 0927)  piperacillin-tazobactam (ZOSYN) IVPB 3.375 g (0 g Intravenous Stopped 04/22/23 0855)  acetaminophen (TYLENOL) suppository 650 mg (650 mg Rectal Given 04/22/23 2956)    ED Course/ Medical Decision Making/ A&P  Medical Decision Making Amount and/or Complexity of Data Reviewed Labs: ordered. Radiology: ordered. ECG/medicine tests: ordered.  Risk OTC drugs. Prescription drug management. Decision regarding hospitalization.   This patient presents to the ED for concern  of shortness of breath, this involves an extensive number of treatment options, and is a complaint that carries with it a high risk of complications and morbidity.  The differential diagnosis includes pneumonia, fluid overload, pulmonary embolism, right culture abnormality, sepsis, shock   Co morbidities that complicate the patient evaluation  breast cancer, hyperlipidemia hypertension diabetes metabolic encephalopathy, eliquis    Additional history obtained:  Additional history obtained from 04/16/23   Lab Tests:  I personally interpreted labs.  The pertinent results include:   CMP with no leukocytosis, hemoglobin 11.5.  CMP shows potassium of 5.5, significant BUN and creatinine bump with significant drop in GFR.  No anion gap.  ENT five 1.1, ammonia 22, PTT 40, PT 16.9, INR 1.4.  Respiratory panel negative, initial lactic of 2.0, repeat lactic of 1.3.  UA shows trace leukocytes otherwise unremarkable.   Imaging Studies ordered:  I ordered imaging studies including portable chest x-ray I independently visualized and interpreted imaging which showed pending I agree with the radiologist interpretation   Cardiac Monitoring: / EKG:  The patient was maintained on a cardiac monitor.  I personally viewed and interpreted the cardiac monitored which showed an underlying rhythm of: sinus tachycardia with RBBB   Consultations Obtained:  I requested consultation with the critical care,  and discussed lab and imaging findings as well as pertinent plan - they recommend: admit    Problem List / ED Course / Critical interventions / Medication management  Patient reporting to emergency room with 1 day of confusion reports associated urinary retention.  On arrival patient hypotensive tachycardic fever of 102 and satting 67% on room air.  Patient's oxygen saturation improved with 6 L nasal cannula.  Initiated sepsis workup and IV antibiotics.  Patient's family confirmed that patient has had  confusion and is not acting at her baseline.  Patient's husband at bedside reports patient typically has frequent urination in the evening however has not been able to urinate since she went to bed last night.  Concern for urinary tract infection, patient has urinary retention, place foley.  After 3 L bolus patient did not have improvement in blood pressure MAP thus started Levophed.  Patient responded well after Levophed initiated.  UA does not show urinary tract infection, labs show significant AKI and chest x-ray show vascular congestion.  Given patient's confusion, significant AKI and significant hypotension from presumed sepsis consulted critical care who agreed to see the patient. I ordered medication including 19mL/kg NS bolus, IV antibiotics, tylenol  Patient's blood pressure remains low after 3 L, initiated Levophed.  Reevaluation of the patient after these medicines showed that the patient stayed the same I have reviewed the patients home medicines and have made adjustments as needed   Plan  Admit for severe sepsis, HOTN, AMS, and AKI         Final Clinical Impression(s) / ED Diagnoses Final diagnoses:  None    Rx / DC Orders ED Discharge Orders     None         Raford Pitcher Evalee Jefferson 04/22/23 1109    Eber Hong, MD 04/22/23 2003

## 2023-04-22 NOTE — ED Notes (Signed)
Patient's BP cuff was located on right lower leg as she has had bilateral mastectomies. Family stated that she normally gets IV/phleb sticks in right arm, as they took less nodes from that side. They stated however she normally has BP taken on leg. BP readings on leg were consistently low, except for when patient was moving. PA Barrett notified and verbal order for levo obtained. When CCM NP Whitney came by, we switched BP cuff to right arm at her request which was reading normal. CCM MD Myrla Halsted bedside requested to get reading back on leg, as that is her normal site and baseline.

## 2023-04-22 NOTE — ED Triage Notes (Signed)
Husband stated, Her Oxygen level was not above 75 this morning. She has not peed all night. She has been confused since last night.

## 2023-04-22 NOTE — Sepsis Progress Note (Signed)
Sepsis protocol is being followed by eLink. 

## 2023-04-23 DIAGNOSIS — N179 Acute kidney failure, unspecified: Secondary | ICD-10-CM | POA: Diagnosis not present

## 2023-04-23 DIAGNOSIS — J9601 Acute respiratory failure with hypoxia: Secondary | ICD-10-CM | POA: Diagnosis not present

## 2023-04-23 DIAGNOSIS — R6521 Severe sepsis with septic shock: Secondary | ICD-10-CM | POA: Diagnosis not present

## 2023-04-23 DIAGNOSIS — A419 Sepsis, unspecified organism: Secondary | ICD-10-CM | POA: Diagnosis not present

## 2023-04-23 LAB — BASIC METABOLIC PANEL
Anion gap: 8 (ref 5–15)
BUN: 27 mg/dL — ABNORMAL HIGH (ref 8–23)
CO2: 24 mmol/L (ref 22–32)
Calcium: 8.4 mg/dL — ABNORMAL LOW (ref 8.9–10.3)
Chloride: 102 mmol/L (ref 98–111)
Creatinine, Ser: 1.86 mg/dL — ABNORMAL HIGH (ref 0.44–1.00)
GFR, Estimated: 30 mL/min — ABNORMAL LOW (ref >60)
Glucose, Bld: 138 mg/dL — ABNORMAL HIGH (ref 70–99)
Potassium: 4.8 mmol/L (ref 3.5–5.1)
Sodium: 134 mmol/L — ABNORMAL LOW (ref 135–145)

## 2023-04-23 LAB — URINALYSIS, W/ REFLEX TO CULTURE (INFECTION SUSPECTED)
Bacteria, UA: NONE SEEN
Bilirubin Urine: NEGATIVE
Glucose, UA: NEGATIVE mg/dL
Ketones, ur: NEGATIVE mg/dL
Leukocytes,Ua: NEGATIVE
Nitrite: NEGATIVE
Protein, ur: NEGATIVE mg/dL
Specific Gravity, Urine: 1.009 (ref 1.005–1.030)
pH: 5 (ref 5.0–8.0)

## 2023-04-23 LAB — CBC
HCT: 35.8 % — ABNORMAL LOW (ref 36.0–46.0)
Hemoglobin: 10.9 g/dL — ABNORMAL LOW (ref 12.0–15.0)
MCH: 27.3 pg (ref 26.0–34.0)
MCHC: 30.4 g/dL (ref 30.0–36.0)
MCV: 89.7 fL (ref 80.0–100.0)
Platelets: 178 10*3/uL (ref 150–400)
RBC: 3.99 MIL/uL (ref 3.87–5.11)
RDW: 16.6 % — ABNORMAL HIGH (ref 11.5–15.5)
WBC: 5 10*3/uL (ref 4.0–10.5)
nRBC: 0 % (ref 0.0–0.2)

## 2023-04-23 LAB — GLUCOSE, CAPILLARY
Glucose-Capillary: 140 mg/dL — ABNORMAL HIGH (ref 70–99)
Glucose-Capillary: 133 mg/dL — ABNORMAL HIGH (ref 70–99)
Glucose-Capillary: 118 mg/dL — ABNORMAL HIGH (ref 70–99)
Glucose-Capillary: 157 mg/dL — ABNORMAL HIGH (ref 70–99)

## 2023-04-23 LAB — MAGNESIUM: Magnesium: 1.7 mg/dL (ref 1.7–2.4)

## 2023-04-23 MED ORDER — INFLUENZA VIRUS VACC SPLIT PF (FLUZONE) 0.5 ML IM SUSY
0.5000 mL | PREFILLED_SYRINGE | INTRAMUSCULAR | Status: DC
  Filled 2023-04-23: qty 0.5

## 2023-04-23 MED ORDER — ALPRAZOLAM 0.5 MG PO TABS
0.2500 mg | ORAL_TABLET | Freq: Once | ORAL | Status: AC
  Administered 2023-04-23: 0.25 mg via ORAL
  Filled 2023-04-23: qty 1

## 2023-04-23 MED ORDER — MAGNESIUM SULFATE 2 GM/50ML IV SOLN
2.0000 g | Freq: Once | INTRAVENOUS | Status: AC
  Administered 2023-04-23: 2 g via INTRAVENOUS
  Filled 2023-04-23: qty 50

## 2023-04-23 MED ORDER — PIPERACILLIN-TAZOBACTAM 3.375 G IVPB
3.3750 g | Freq: Three times a day (TID) | INTRAVENOUS | Status: DC
  Administered 2023-04-23 – 2023-04-24 (×2): 3.375 g via INTRAVENOUS
  Filled 2023-04-23 (×3): qty 50

## 2023-04-23 MED ORDER — ALPRAZOLAM 0.5 MG PO TABS: 0.5000 mg | ORAL_TABLET | Freq: Every evening | ORAL | Status: DC | PRN

## 2023-04-23 MED ORDER — HEPARIN SODIUM (PORCINE) 5000 UNIT/ML IJ SOLN
5000.0000 [IU] | Freq: Three times a day (TID) | INTRAMUSCULAR | Status: DC
  Administered 2023-04-23 – 2023-04-24 (×3): 5000 [IU] via SUBCUTANEOUS
  Filled 2023-04-23 (×3): qty 1

## 2023-04-23 NOTE — Plan of Care (Signed)
Patient transferred to [facility] overnight from MICU. Patient is in no obvious distress. No maintenance infusions. Patient denies pain or SHOB.   Problem: Education: Goal: Ability to describe self-care measures that may prevent or decrease complications (Diabetes Survival Skills Education) will improve Outcome: Progressing Goal: Individualized Educational Video(s) Outcome: Progressing   Problem: Coping: Goal: Ability to adjust to condition or change in health will improve Outcome: Progressing   Problem: Fluid Volume: Goal: Ability to maintain a balanced intake and output will improve Outcome: Progressing   Problem: Health Behavior/Discharge Planning: Goal: Ability to identify and utilize available resources and services will improve Outcome: Progressing Goal: Ability to manage health-related needs will improve Outcome: Progressing   Problem: Metabolic: Goal: Ability to maintain appropriate glucose levels will improve Outcome: Progressing   Problem: Nutritional: Goal: Maintenance of adequate nutrition will improve Outcome: Progressing Goal: Progress toward achieving an optimal weight will improve Outcome: Progressing   Problem: Skin Integrity: Goal: Risk for impaired skin integrity will decrease Outcome: Progressing   Problem: Tissue Perfusion: Goal: Adequacy of tissue perfusion will improve Outcome: Progressing   Problem: Education: Goal: Knowledge of General Education information will improve Description: Including pain rating scale, medication(s)/side effects and non-pharmacologic comfort measures Outcome: Progressing   Problem: Health Behavior/Discharge Planning: Goal: Ability to manage health-related needs will improve Outcome: Progressing   Problem: Clinical Measurements: Goal: Ability to maintain clinical measurements within normal limits will improve Outcome: Progressing Goal: Will remain free from infection Outcome: Progressing Goal: Diagnostic test results  will improve Outcome: Progressing Goal: Respiratory complications will improve Outcome: Progressing Goal: Cardiovascular complication will be avoided Outcome: Progressing   Problem: Activity: Goal: Risk for activity intolerance will decrease Outcome: Progressing   Problem: Nutrition: Goal: Adequate nutrition will be maintained Outcome: Progressing   Problem: Coping: Goal: Level of anxiety will decrease Outcome: Progressing   Problem: Elimination: Goal: Will not experience complications related to bowel motility Outcome: Progressing Goal: Will not experience complications related to urinary retention Outcome: Progressing   Problem: Pain Management: Goal: General experience of comfort will improve Outcome: Progressing   Problem: Safety: Goal: Ability to remain free from injury will improve Outcome: Progressing   Problem: Skin Integrity: Goal: Risk for impaired skin integrity will decrease Outcome: Progressing

## 2023-04-23 NOTE — Progress Notes (Signed)
[facility] ADULT ICU REPLACEMENT PROTOCOL   The patient does apply for the [facility] Adult ICU Electrolyte Replacment Protocol based on the criteria listed below:   1.Exclusion criteria: TCTS, ECMO, Dialysis, and Myasthenia Gravis patients 2. Is GFR >/= 30 ml/min? Yes.    Patient's GFR today is 30 3. Is SCr </= 2? Yes.   Patient's SCr is 1.86 mg/dL 4. Did SCr increase >/= 0.5 in 24 hours? No. 5.Pt's weight >40kg  Yes.   6. Abnormal electrolyte(s): Mg = 1.7  7. Electrolytes replaced per protocol 8.  Call MD STAT for K+ </= 2.5, Phos </= 1, or Mag </= 1 Physician:  Delia Chimes, eMD  Suzan Slick Siyon Linck 04/23/2023 4:30 AM

## 2023-04-23 NOTE — Progress Notes (Signed)
eLink Physician-Brief Progress Note Patient Name: [name] DOB: [birth year] MRN: [id number]   Date of Service  04/23/2023  HPI/Events of Note  Significant baseline anxiety on 0.5 mg of Xanax at home twice daily as needed presented initially with hypotension.  eICU Interventions  Resume home dose Xanax-additional 0.25 mg dose tonight to match home dosing     Intervention Category Minor Interventions: Agitation / anxiety - evaluation and management  [name] 04/23/2023, 9:41 PM

## 2023-04-23 NOTE — Progress Notes (Signed)
NAME:  [name], MRN:  [id number], DOB:  05-12-1961, LOS: 1 ADMISSION DATE:  04/22/2023, CONSULTATION DATE: 04/22/1999  REFERRING MD: Emergency department, CHIEF COMPLAINT: Hypotension  62 year old female with recent mastectomies that she has completed radiation and recent diarrhea and exposure to sick grandchildren.  She presents with hypotension although there is probably accurate blood pressures in her leg.  Pressures were checked and her arms were 120/70 and when checked on her lower extremities they are systolic of 70.  He said 3 L fluid resuscitation.  He will continue the fluid resuscitation with normal saline at 150 an hour we will avoid vasopressors if possible we will need to check intermittent arm pressures before starting any type of vasopressor support.  It may behoove [place] to place an arterial line just to confirm that blood pressures are accurate  Pertinent  Medical History   Past Medical History:  Diagnosis Date   Anemia    Arthritis    Asthma    Bladder spasms    Breast cancer (HCC)    Diabetes mellitus without complication (HCC)    GERD (gastroesophageal reflux disease)    Heart murmur    Hyperlipidemia    Hypertension      Significant Hospital Events: Including procedures, antibiotic start and stop dates in addition to other pertinent events   11/8 admitted  Interim History / Subjective:  She feels better. She doesn't remember the events of yesterday. She denies pain and wants her foley removed.   Objective   Blood pressure 107/60, pulse 92, temperature (!) 101.1 F (38.4 C), resp. rate (!) 25, height 5\' 4"  (1.626 m), weight 89.9 kg, last menstrual period 04/22/1997, SpO2 97%.        Intake/Output Summary (Last 24 hours) at 04/23/2023 0744 Last data filed at 04/23/2023 9562 Gross per 24 hour  Intake 3842.78 ml  Output 3550 ml  Net 292.78 ml   Filed Weights   04/22/23 0747 04/22/23 1116 04/23/23 0610  Weight: 90.7 kg 91.1 kg 89.9 kg     Examination: General: elderly woman lying in bed in NAD HENT: [place]/AT, eyes anicteric Lungs: CTAB, breathing comfortably on 3L [employer]. No conversational dyspnea.  Cardiovascular: S1S2, RRR Chest: chronic scarring and dark skin pigmentation from mastectomy Abdomen:  obese, soft, NT Extremities: no cyanosis, minimal edema Neuro: awake, alert, answering questions appropriately. Moving all extremities.  GU: foley with amber urine  Na+ 134 BUN 27 Cr 1.86 WBC 5 H/H 10.9/35.8 Platelets 178 UA no WBC CXR personally reviewed> pulm edema   RVP negative Blood culture: NGTD  Resolved Hospital Problem list     Assessment & Plan:  Shock from presumed sepsis in a patient with recent treatment for breast cancer status post bilateral mastectomies completed chemotherapy and just completed radiation.  She denies fevers chills or sweats.  She has had some diarrhea and hypotension.  No UTI, no strong evidence of pneumonia clinically. She does not endorse pain. No recent OP BP measurements- question if she has been having hypotension at home from her BP meds, leading to AKI.  -con't holding olmesartan, hydrochlorothiazide, and amlodipine -weaning off NE- currently 3 mcg -follow cultures -con't antibiotics empirically  History of hypertension -hold PTA olmesartan, hydrochlorothiazide, and amlodipine  Acute metabolic encephalopathy- potentially due to drug accumulation with AKI or hypotension. Sepsis also possible, but no source -con't antibiotics empirically -maintain MAP >65 -awake enough to eat today- carb modified  Recent treatment for bilateral breast cancer -con't PTA anastrazole  Diabetes type 2 -  SSI PRN -hold PTA monjouro and metformin  H/o DVT- completed treatment and was taken off Elliquis  -chemical DVT prophylaxis  AKI hyponatremia -avoid Nephrotoxins, renally dose meds -strict I/O -avoid hypotonic fludis  Chronic pain -elavil, oxycontin, lyrica at reduced  doses  Anxiety -xanax PRN  Anemia -transfuse for Hb <7 or hemodynamically significant bleeding  GERD -PPI  Ambulate today D/c foley Husband updated with patient during rounds.  Start carb modified diet.     Best Practice (right click and "Reselect all SmartList Selections" daily)   Diet/type: NPO DVT prophylaxis: not indicated GI prophylaxis: PPI Lines: N/A Foley:  N/A Code Status:  full code Last date of multidisciplinary goals of care discussion tbd Patient and family updated at bedside Labs   CBC: Recent Labs  Lab 04/22/23 0753 04/22/23 0824 04/22/23 0825 04/23/23 0222  WBC 6.7  --   --  5.0  NEUTROABS 5.8  --   --   --   HGB 11.5* 12.2 12.6 10.9*  HCT 38.2 36.0 37.0 35.8*  MCV 90.3  --   --  89.7  PLT 232  --   --  178    Basic Metabolic Panel: Recent Labs  Lab 04/22/23 0753 04/22/23 0824 04/22/23 0825 04/22/23 1355 04/23/23 0222  NA 137 136 137 134* 134*  K 5.5* 5.5* 5.5* 5.6* 4.8  CL 103  --  107 102 102  CO2 19*  --   --  19* 24  GLUCOSE 175*  --  175* 131* 138*  BUN 42*  --  43* 39* 27*  CREATININE 3.79*  --  3.90* 3.06* 1.86*  CALCIUM 8.3*  --   --  8.2* 8.4*  MG  --   --   --   --  1.7   GFR: Estimated Creatinine Clearance: 33.2 mL/min (A) (by C-G formula based on SCr of 1.86 mg/dL (H)). Recent Labs  Lab 04/22/23 0753 04/22/23 0827 04/22/23 1003 04/22/23 1355 04/23/23 0222  PROCALCITON  --   --   --  0.50  --   WBC 6.7  --   --   --  5.0  LATICACIDVEN  --  2.0* 1.3  --   --       Critical care time:      This patient is critically ill with multiple organ system failure which requires frequent high complexity decision making, assessment, support, evaluation, and titration of therapies. This was completed through the application of advanced monitoring technologies and extensive interpretation of multiple databases. During this encounter critical care time was devoted to patient care services described in this note for 40  minutes.   Steffanie Dunn, DO 04/23/23 12:26 PM [facility]  For contact information, see Amion. If no response to pager, please call PCCM consult pager. After hours, 7PM- 7AM, please call Elink.

## 2023-04-24 DIAGNOSIS — J9621 Acute and chronic respiratory failure with hypoxia: Secondary | ICD-10-CM | POA: Diagnosis not present

## 2023-04-24 DIAGNOSIS — N179 Acute kidney failure, unspecified: Secondary | ICD-10-CM

## 2023-04-24 DIAGNOSIS — N170 Acute kidney failure with tubular necrosis: Secondary | ICD-10-CM

## 2023-04-24 DIAGNOSIS — A419 Sepsis, unspecified organism: Secondary | ICD-10-CM | POA: Diagnosis not present

## 2023-04-24 DIAGNOSIS — R6521 Severe sepsis with septic shock: Secondary | ICD-10-CM

## 2023-04-24 DIAGNOSIS — E1165 Type 2 diabetes mellitus with hyperglycemia: Secondary | ICD-10-CM

## 2023-04-24 LAB — BASIC METABOLIC PANEL
Anion gap: 8 (ref 5–15)
BUN: 16 mg/dL (ref 8–23)
CO2: 27 mmol/L (ref 22–32)
Calcium: 8.8 mg/dL — ABNORMAL LOW (ref 8.9–10.3)
Chloride: 100 mmol/L (ref 98–111)
Creatinine, Ser: 1.23 mg/dL — ABNORMAL HIGH (ref 0.44–1.00)
GFR, Estimated: 49 mL/min — ABNORMAL LOW (ref >60)
Glucose, Bld: 142 mg/dL — ABNORMAL HIGH (ref 70–99)
Potassium: 5.4 mmol/L — ABNORMAL HIGH (ref 3.5–5.1)
Sodium: 135 mmol/L (ref 135–145)

## 2023-04-24 LAB — CBC
HCT: 35.4 % — ABNORMAL LOW (ref 36.0–46.0)
Hemoglobin: 10.5 g/dL — ABNORMAL LOW (ref 12.0–15.0)
MCH: 26.6 pg (ref 26.0–34.0)
MCHC: 29.7 g/dL — ABNORMAL LOW (ref 30.0–36.0)
MCV: 89.8 fL (ref 80.0–100.0)
Platelets: 177 10*3/uL (ref 150–400)
RBC: 3.94 MIL/uL (ref 3.87–5.11)
RDW: 15.9 % — ABNORMAL HIGH (ref 11.5–15.5)
WBC: 4 10*3/uL (ref 4.0–10.5)
nRBC: 0 % (ref 0.0–0.2)

## 2023-04-24 LAB — MAGNESIUM: Magnesium: 1.8 mg/dL (ref 1.7–2.4)

## 2023-04-24 LAB — GLUCOSE, CAPILLARY: Glucose-Capillary: 133 mg/dL — ABNORMAL HIGH (ref 70–99)

## 2023-04-24 MED ORDER — AZITHROMYCIN 250 MG PO TABS: 250.0000 mg | ORAL_TABLET | Freq: Every day | ORAL | Status: DC

## 2023-04-24 MED ORDER — APIXABAN 5 MG PO TABS
5.0000 mg | ORAL_TABLET | Freq: Two times a day (BID) | ORAL | Status: DC
  Filled 2023-04-24: qty 1

## 2023-04-24 MED ORDER — AZITHROMYCIN 250 MG PO TABS: 500.0000 mg | ORAL_TABLET | Freq: Once | ORAL | Status: DC

## 2023-04-24 MED ORDER — METFORMIN HCL ER 500 MG PO TB24
1000.0000 mg | ORAL_TABLET | Freq: Two times a day (BID) | ORAL | Status: DC
  Filled 2023-04-24: qty 2

## 2023-04-24 MED ORDER — SODIUM ZIRCONIUM CYCLOSILICATE 10 G PO PACK
10.0000 g | PACK | Freq: Once | ORAL | Status: AC
  Administered 2023-04-24: 10 g via ORAL
  Filled 2023-04-24: qty 1

## 2023-04-24 NOTE — Discharge Summary (Signed)
Physician Discharge Summary   Patient: [name] MRN: [id number] DOB: 04/22/1961  Admit date:     04/22/2023  Discharge date: 04/24/23  Discharge Physician: Enedina Finner   PCP: Fatima Sanger, FNP   Recommendations at discharge:   follow-up oncology on your appointment. Use your oxygen as instructed Avoid Carbonated drinks high in potassium F/u with your PCP in 2 days to get BMP checked as out pt Oral hydration with WATER!  Discharge Diagnoses: Principal Problem:   Sepsis (HCC)  Darienne Baus is a 62 year old Caucasian female with history of asthma, remote history of smoking, breast cancer status post bilateral mastectomy, completed chemo radiation recently, history of DVT on anticoagulation came to the emergency room with recent diarrhea and exposure to stepgrandchildren.  She was hypotensive in the emergency room and received IV fluids per sepsis protocol. She had fever of 103. Started on broad-spectrum antibiotic with sepsis protocol. She was transferred to ICU received IV levophed. Patient was transferred out of ICU yesterday overall improving. Remains afebrile.  Septic shock suspected viral syndrome/dehydration with diarrhea and hypotension acute renal failure/ATN on CKD stage IIIa in the setting of dehydration with G.I. loss, blood pressure meds -- baseline creatinine 1.2-1.4 -- workup for sepsis remains negative. No source identified. -- Sepsis resolved. -Will-empiric Zithromax -- blood cultures remain negative -- patient's vital signs stable. -- Came in with creatinine of 3.9--- down to 1.2  Hypoxia with history of asthma/COPD and recent completion of radiation possible interstitial fibrosis -- BNP within normal limit -- patient decided to 85% transiently on room air with ambulation. Will set up for oxygen as outpatient and will be weaned off if able to by PCP. Patient advised to wear 2 L. -- Currently not smoking. Used to be a smoker before. -- Chest x-ray no  pneumonia  Recent treatment for bilateral breast cancer with mastectomy bilateral, radiation, chemo -- patient follows with Dr. Pamelia Hoit as out pt  Hyperkalemia -- patient has had history of borderline high potassium in the past. -- She is advised to avoid carbonated drinks high in potassium. She is also encouraged to drink water for hydration -- patient voice understanding- --received one dose of Lokelma -- patient advised to get metabolic panel checked in two days as outpatient to ensure it is stable  Hypertension -- blood pressure stable. Holding amlodipine and olmesartan -- patient advised to take blood pressure at home and follow-up PCP.  Type II diabetes with CKD-IIIa Obesity -- will resume metformin for now. Defer to PCP to keep an eye on kidney numbers and use alternate meds if needed in future --on Mounjaro  H/o DVT -- will resume eliquis. According to oncology note from 03/16/2023-- patient will be on eliquis till end of November   About was discussed with patient and patient's husband in the room. All questions were answered. Patient is eager to go home.     Pain control - [employer] Controlled Substance Reporting System database was reviewed. and patient was instructed, not to drive, operate heavy machinery, perform activities at heights, swimming or participation in water activities or provide baby-sitting services while on Pain, Sleep and Anxiety Medications; until their outpatient Physician has advised to do so again. Also recommended to not to take more than prescribed Pain, Sleep and Anxiety Medications.  Consultants: PCCM  Disposition: Home Diet recommendation:  Discharge Diet Orders (From admission, onward)     Start     Ordered   04/24/23 0000  Diet - low sodium heart healthy  04/24/23 1201   04/24/23 0000  Diet Carb Modified        04/24/23 1201           Cardiac and Carb modified diet DISCHARGE MEDICATION: Allergies as of 04/24/2023        Reactions   Bactrim [sulfamethoxazole-trimethoprim] Hives   Codeine Phosphate Other (See Comments)   GI upset   Lipitor [atorvastatin] Other (See Comments)   Arthralgias   Zocor [simvastatin] Other (See Comments)   Myalgias Arthralgias   Tape Other (See Comments)   Skin tears very easily - all tape will pull off skin        Medication List     STOP taking these medications    amLODipine 10 MG tablet Commonly known as: NORVASC   GOODY HEADACHE PO   hydrochlorothiazide 12.5 MG tablet Commonly known as: HYDRODIURIL   olmesartan 40 MG tablet Commonly known as: BENICAR       TAKE these medications    albuterol 108 (90 Base) MCG/ACT inhaler Commonly known as: VENTOLIN HFA Inhale 2 puffs into the lungs every 6 (six) hours as needed for wheezing or shortness of breath.   ALPRAZolam 0.5 MG tablet Commonly known as: XANAX Take 0.5 mg by mouth 2 (two) times daily.   amitriptyline 50 MG tablet Commonly known as: ELAVIL Take 50 mg by mouth at bedtime.   anastrozole 1 MG tablet Commonly known as: ARIMIDEX Take 1 tablet (1 mg total) by mouth daily.   apixaban 5 MG Tabs tablet Commonly known as: ELIQUIS Take 1 tablet (5 mg total) by mouth 2 (two) times daily.   ezetimibe 10 MG tablet Commonly known as: ZETIA Take 10 mg by mouth daily.   metFORMIN 500 MG 24 hr tablet Commonly known as: GLUCOPHAGE-XR Take 1,000 mg by mouth 2 (two) times daily.   Mounjaro 5 MG/0.5ML Pen Generic drug: tirzepatide Inject 5 mg into the skin every Friday.   ondansetron 8 MG tablet Commonly known as: ZOFRAN Take 8 mg by mouth every 8 (eight) hours as needed for nausea or vomiting.   OxyCONTIN 30 MG 12 hr tablet Generic drug: oxyCODONE Take 30 mg by mouth every 12 (twelve) hours.   pantoprazole 40 MG tablet Commonly known as: PROTONIX Take 40 mg by mouth daily.   pregabalin 100 MG capsule Commonly known as: LYRICA Take 1 capsule (100 mg total) by mouth 2 (two) times daily.                Durable Medical Equipment  (From admission, onward)           Start     Ordered   04/24/23 1143  For home use only DME oxygen  Once       Question Answer Comment  Length of Need 6 Months   Liters per Minute 2   Frequency Continuous (stationary and portable oxygen unit needed)   Oxygen conserving device Yes   Oxygen delivery system Gas      04/24/23 1142            Follow-up Information     Fatima Sanger, FNP. Call in 2 day(s).   Specialty: Internal Medicine Why: get BMP checked Contact information: [address] [place] [address] [phone]                Discharge Exam: Filed Weights   04/22/23 1116 04/23/23 0610 04/24/23 0424  Weight: 91.1 kg 89.9 kg 87.8 kg   Alert and oriented times  three. Respiratory clear to auscultation no respiratory distress, decreased breath sounds basis. Cardiovascular both heart sounds normal. Mild tachycardia no murmur abdomen soft benign nontender neuro- grossly intact.  Condition at discharge: fair  The results of significant diagnostics from this hospitalization (including imaging, microbiology, ancillary and laboratory) are listed below for reference.   Imaging Studies: DG Chest Port 1 View  Result Date: 04/22/2023 CLINICAL DATA:  Questionable sepsis EXAM: PORTABLE CHEST 1 VIEW COMPARISON:  X-ray 11/06/2022 FINDINGS: Calcified aorta. Normal cardiac silhouette. No pneumothorax or effusion. Mild vascular congestion with some interstitial changes. Overlapping cardiac leads. Surgical clips in the axillary regions. IMPRESSION: Vascular congestion with some interstitial changes. Recommend follow up Electronically Signed   By: Karen Kays M.D.   On: 04/22/2023 10:20    Microbiology: Results for orders placed or performed during the hospital encounter of 04/22/23  Resp panel by RT-PCR (RSV, Flu A&B, Covid) Anterior Nasal Swab     Status: None   Collection Time: 04/22/23  7:53  AM   Specimen: Anterior Nasal Swab  Result Value Ref Range Status   SARS Coronavirus 2 by RT PCR NEGATIVE NEGATIVE Final   Influenza A by PCR NEGATIVE NEGATIVE Final   Influenza B by PCR NEGATIVE NEGATIVE Final    Comment: (NOTE) The Xpert Xpress SARS-CoV-2/FLU/RSV plus assay is intended as an aid in the diagnosis of influenza from Nasopharyngeal swab specimens and should not be used as a sole basis for treatment. Nasal washings and aspirates are unacceptable for Xpert Xpress SARS-CoV-2/FLU/RSV testing.  Fact Sheet for Patients: BloggerCourse.com  Fact Sheet for Healthcare Providers: SeriousBroker.it  This test is not yet approved or cleared by the [place] FDA and has been authorized for detection and/or diagnosis of SARS-CoV-2 by FDA under an Emergency Use Authorization (EUA). This EUA will remain in effect (meaning this test can be used) for the duration of the COVID-19 declaration under Section 564(b)(1) of the Act, 21 U.S.C. section 360bbb-3(b)(1), unless the authorization is terminated or revoked.     Resp Syncytial Virus by PCR NEGATIVE NEGATIVE Final    Comment: (NOTE) Fact Sheet for Patients: BloggerCourse.com  Fact Sheet for Healthcare Providers: SeriousBroker.it  This test is not yet approved or cleared by the [place] FDA and has been authorized for detection and/or diagnosis of SARS-CoV-2 by FDA under an Emergency Use Authorization (EUA). This EUA will remain in effect (meaning this test can be used) for the duration of the COVID-19 declaration under Section 564(b)(1) of the Act, 21 U.S.C. section 360bbb-3(b)(1), unless the authorization is terminated or revoked.  Performed at [facility], [address] [place], [place] [address]   Blood Culture (routine x 2)     Status: None (Preliminary result)   Collection Time: 04/22/23  7:53 AM    Specimen: BLOOD RIGHT WRIST  Result Value Ref Range Status   Specimen Description BLOOD RIGHT WRIST  Final   Special Requests   Final    BOTTLES DRAWN AEROBIC AND ANAEROBIC Blood Culture results may not be optimal due to an excessive volume of blood received in culture bottles   Culture   Final    NO GROWTH 2 DAYS Performed at [facility], [address] [place], [place] [address]    Report Status PENDING  Incomplete  Blood Culture (routine x 2)     Status: None (Preliminary result)   Collection Time: 04/22/23  7:58 AM   Specimen: BLOOD  Result Value Ref Range Status   Specimen  Description BLOOD SITE NOT SPECIFIED  Final   Special Requests   Final    BOTTLES DRAWN AEROBIC AND ANAEROBIC Blood Culture results may not be optimal due to an excessive volume of blood received in culture bottles   Culture   Final    NO GROWTH 2 DAYS Performed at [facility], [address] [place], [place] [address]    Report Status PENDING  Incomplete  MRSA Next Gen by PCR, Nasal     Status: None   Collection Time: 04/22/23 11:12 AM   Specimen: Nasal Mucosa; Nasal Swab  Result Value Ref Range Status   MRSA by PCR Next Gen NOT DETECTED NOT DETECTED Final    Comment: (NOTE) The GeneXpert MRSA Assay (FDA approved for NASAL specimens only), is one component of a comprehensive MRSA colonization surveillance program. It is not intended to diagnose MRSA infection nor to guide or monitor treatment for MRSA infections. Test performance is not FDA approved in patients less than 43 years old. Performed at [facility], [address] [place], [place] [address]   Culture, blood (Routine X 2) w Reflex to ID Panel     Status: None (Preliminary result)   Collection Time: 04/22/23 12:24 PM   Specimen: BLOOD RIGHT HAND  Result Value Ref Range Status   Specimen Description BLOOD RIGHT HAND  Final   Special Requests   Final    BOTTLES DRAWN AEROBIC AND ANAEROBIC Blood Culture results may not  be optimal due to an inadequate volume of blood received in culture bottles   Culture   Final    NO GROWTH 2 DAYS Performed at [facility], [address], [place], [place] [address]    Report Status PENDING  Incomplete  Culture, blood (Routine X 2) w Reflex to ID Panel     Status: None (Preliminary result)   Collection Time: 04/22/23 12:34 PM   Specimen: BLOOD RIGHT HAND  Result Value Ref Range Status   Specimen Description BLOOD RIGHT HAND  Final   Special Requests   Final    BOTTLES DRAWN AEROBIC AND ANAEROBIC Blood Culture adequate volume   Culture   Final    NO GROWTH 2 DAYS Performed at [facility], [address], [place], [place] [address]    Report Status PENDING  Incomplete  Respiratory (~20 pathogens) panel by PCR     Status: None   Collection Time: 04/22/23 12:38 PM   Specimen: Nasopharyngeal Swab; Respiratory  Result Value Ref Range Status   Adenovirus NOT DETECTED NOT DETECTED Final   Coronavirus 229E NOT DETECTED NOT DETECTED Final    Comment: (NOTE) The Coronavirus on the Respiratory Panel, DOES NOT test for the novel  Coronavirus (2019 nCoV)    Coronavirus HKU1 NOT DETECTED NOT DETECTED Final   Coronavirus NL63 NOT DETECTED NOT DETECTED Final   Coronavirus OC43 NOT DETECTED NOT DETECTED Final   Metapneumovirus NOT DETECTED NOT DETECTED Final   Rhinovirus / Enterovirus NOT DETECTED NOT DETECTED Final   Influenza A NOT DETECTED NOT DETECTED Final   Influenza B NOT DETECTED NOT DETECTED Final   Parainfluenza Virus 1 NOT DETECTED NOT DETECTED Final   Parainfluenza Virus 2 NOT DETECTED NOT DETECTED Final   Parainfluenza Virus 3 NOT DETECTED NOT DETECTED Final   Parainfluenza Virus 4 NOT DETECTED NOT DETECTED Final   Respiratory Syncytial Virus NOT DETECTED NOT DETECTED Final   Bordetella pertussis NOT DETECTED NOT DETECTED Final   Bordetella Parapertussis  NOT DETECTED NOT DETECTED Final   Chlamydophila pneumoniae NOT DETECTED NOT DETECTED Final    Mycoplasma pneumoniae NOT DETECTED NOT DETECTED Final    Comment: Performed at [facility], [address] [place], [place] [address]    Labs: CBC: Recent Labs  Lab 04/22/23 0753 04/22/23 0824 04/22/23 0825 04/23/23 0222 04/24/23 0309  WBC 6.7  --   --  5.0 4.0  NEUTROABS 5.8  --   --   --   --   HGB 11.5* 12.2 12.6 10.9* 10.5*  HCT 38.2 36.0 37.0 35.8* 35.4*  MCV 90.3  --   --  89.7 89.8  PLT 232  --   --  178 177   Basic Metabolic Panel: Recent Labs  Lab 04/22/23 0753 04/22/23 0824 04/22/23 0825 04/22/23 1355 04/23/23 0222 04/24/23 0309  NA 137 136 137 134* 134* 135  K 5.5* 5.5* 5.5* 5.6* 4.8 5.4*  CL 103  --  107 102 102 100  CO2 19*  --   --  19* 24 27  GLUCOSE 175*  --  175* 131* 138* 142*  BUN 42*  --  43* 39* 27* 16  CREATININE 3.79*  --  3.90* 3.06* 1.86* 1.23*  CALCIUM 8.3*  --   --  8.2* 8.4* 8.8*  MG  --   --   --   --  1.7 1.8   Liver Function Tests: Recent Labs  Lab 04/22/23 0753  AST 22  ALT 29  ALKPHOS 71  BILITOT 0.5  PROT 6.9  ALBUMIN 3.3*   CBG: Recent Labs  Lab 04/23/23 0751 04/23/23 1127 04/23/23 1732 04/23/23 2052 04/24/23 0624  GLUCAP 140* 133* 118* 157* 133*    Discharge time spent: greater than 30 minutes.  Signed: Enedina Finner, MD [facility] 04/24/2023

## 2023-04-24 NOTE — Progress Notes (Addendum)
Nurse requested Mobility Specialist to perform oxygen saturation test with pt which includes removing pt from oxygen both at rest and while ambulating.  Below are the results from that testing.     Patient Saturations on Room Air at Rest = spO2 89%  Patient Saturations on Room Air while Ambulating = sp02 85% .  Rested and performed pursed lip breathing for 1 minute with sp02 at 87%.  Patient Saturations on 2 Liters of oxygen while Ambulating = sp02 92%   Reported results to nurse.

## 2023-04-24 NOTE — Progress Notes (Signed)
Mobility Specialist Progress Note    04/24/23 1133  Mobility  Activity Ambulated independently in hallway  Level of Assistance Standby assist, set-up cues, supervision of patient - no hands on  Assistive Device None  Distance Ambulated (ft) 200 ft  Activity Response Tolerated well  Mobility Referral Yes  $Mobility charge 1 Mobility  Mobility Specialist Start Time (ACUTE ONLY) 1114  Mobility Specialist Stop Time (ACUTE ONLY) 1132  Mobility Specialist Time Calculation (min) (ACUTE ONLY) 18 min   Pt received in room and agreeable. No complaints. Encouraged pursed lip breathing. SpO2 as low as 85% on RA. SpO2 >/=90% on 2LO2. Returned to rooom. RN notified.   [place] Mobility Specialist  Please Neurosurgeon or Rehab Office at [phone]

## 2023-04-24 NOTE — Discharge Instructions (Addendum)
Use your oxygen as instructed Avoid Carbonated drinks high in potassium F/u with your PCP in 2 days to get BMP checked as out pt Oral hydration with WATER!

## 2023-04-24 NOTE — TOC Transition Note (Signed)
Transition of Care [facility]) - CM/SW Discharge Note   Patient Details  Name: [name] MRN: [id number] Date of Birth: 02/27/1961  Transition of Care [facility]) CM/SW Contact:  Lawerance Sabal, RN Phone Number: 04/24/2023, 12:06 PM   Clinical Narrative:     Patient for DC today. Will need home oxygen. This has been ordered through [employer]. It will be delivered to her room, they are aware she is in a hurry to leave.   Final next level of care: Home/Self Care Barriers to Discharge: No Barriers Identified   Patient Goals and CMS Choice      Discharge Placement                         Discharge Plan and Services Additional resources added to the After Visit Summary for                  DME Arranged: Oxygen   Date DME Agency Contacted: 04/24/23 Time DME Agency Contacted: 1204 Representative spoke with at DME Agency: [employer]            Social Determinants of Health (SDOH) Interventions SDOH Screenings   Food Insecurity: No Food Insecurity (04/23/2023)  Housing: Low Risk  (04/23/2023)  Transportation Needs: No Transportation Needs (04/23/2023)  Utilities: Not At Risk (04/23/2023)  Depression (PHQ2-9): Low Risk  (12/27/2022)  Tobacco Use: Medium Risk (04/22/2023)     Readmission Risk Interventions     No data to display

## 2023-04-24 NOTE — Progress Notes (Signed)
Patient and spouse received discharge instructions to include follow up appointments and medication changes. Patient and spouse verbalized understanding of instructions.

## 2023-04-25 LAB — LEGIONELLA PNEUMOPHILA SEROGP 1 UR AG: L. pneumophila Serogp 1 Ur Ag: NEGATIVE

## 2023-04-27 LAB — CULTURE, BLOOD (ROUTINE X 2)
Culture: NO GROWTH
Culture: NO GROWTH
Culture: NO GROWTH
Culture: NO GROWTH
Special Requests: ADEQUATE

## 2023-05-05 ENCOUNTER — Encounter: Payer: Self-pay | Admitting: Hematology and Oncology

## 2023-05-24 ENCOUNTER — Encounter: Payer: Self-pay | Admitting: Hematology and Oncology

## 2023-05-24 ENCOUNTER — Telehealth: Payer: Self-pay | Admitting: Adult Health

## 2023-05-24 NOTE — Telephone Encounter (Signed)
 Patient aware of rescheduled appointment

## 2023-06-01 ENCOUNTER — Other Ambulatory Visit (HOSPITAL_COMMUNITY): Payer: Self-pay

## 2023-06-01 MED ORDER — OXYCONTIN 30 MG PO T12A
30.0000 mg | EXTENDED_RELEASE_TABLET | Freq: Two times a day (BID) | ORAL | 0 refills | Status: DC
  Filled 2023-06-01 – 2023-06-02 (×2): qty 60, 30d supply, fill #0

## 2023-06-02 ENCOUNTER — Encounter: Payer: Self-pay | Admitting: Hematology and Oncology

## 2023-06-02 ENCOUNTER — Other Ambulatory Visit (HOSPITAL_COMMUNITY): Payer: Self-pay

## 2023-06-29 ENCOUNTER — Other Ambulatory Visit (HOSPITAL_COMMUNITY): Payer: Self-pay

## 2023-06-29 DIAGNOSIS — D508 Other iron deficiency anemias: Secondary | ICD-10-CM | POA: Diagnosis not present

## 2023-06-29 DIAGNOSIS — I10 Essential (primary) hypertension: Secondary | ICD-10-CM | POA: Diagnosis not present

## 2023-06-29 DIAGNOSIS — E7800 Pure hypercholesterolemia, unspecified: Secondary | ICD-10-CM | POA: Diagnosis not present

## 2023-06-29 DIAGNOSIS — E039 Hypothyroidism, unspecified: Secondary | ICD-10-CM | POA: Diagnosis not present

## 2023-06-29 DIAGNOSIS — E559 Vitamin D deficiency, unspecified: Secondary | ICD-10-CM | POA: Diagnosis not present

## 2023-06-29 DIAGNOSIS — N390 Urinary tract infection, site not specified: Secondary | ICD-10-CM | POA: Diagnosis not present

## 2023-06-29 MED ORDER — OXYCONTIN 30 MG PO T12A
30.0000 mg | EXTENDED_RELEASE_TABLET | Freq: Two times a day (BID) | ORAL | 0 refills | Status: DC
  Filled 2023-07-01: qty 60, 30d supply, fill #0
  Filled ????-??-??: fill #0

## 2023-06-30 ENCOUNTER — Encounter: Payer: Self-pay | Admitting: Hematology and Oncology

## 2023-06-30 ENCOUNTER — Other Ambulatory Visit (HOSPITAL_COMMUNITY): Payer: Self-pay

## 2023-06-30 DIAGNOSIS — I10 Essential (primary) hypertension: Secondary | ICD-10-CM | POA: Diagnosis not present

## 2023-06-30 DIAGNOSIS — E7800 Pure hypercholesterolemia, unspecified: Secondary | ICD-10-CM | POA: Diagnosis not present

## 2023-06-30 DIAGNOSIS — M791 Myalgia, unspecified site: Secondary | ICD-10-CM | POA: Diagnosis not present

## 2023-06-30 DIAGNOSIS — E1165 Type 2 diabetes mellitus with hyperglycemia: Secondary | ICD-10-CM | POA: Diagnosis not present

## 2023-06-30 DIAGNOSIS — T466X5A Adverse effect of antihyperlipidemic and antiarteriosclerotic drugs, initial encounter: Secondary | ICD-10-CM | POA: Diagnosis not present

## 2023-07-01 ENCOUNTER — Other Ambulatory Visit (HOSPITAL_COMMUNITY): Payer: Self-pay

## 2023-07-04 ENCOUNTER — Encounter: Payer: Self-pay | Admitting: Hematology and Oncology

## 2023-07-06 ENCOUNTER — Encounter: Payer: Self-pay | Admitting: Registered Nurse

## 2023-07-06 DIAGNOSIS — E7800 Pure hypercholesterolemia, unspecified: Secondary | ICD-10-CM | POA: Diagnosis not present

## 2023-07-06 DIAGNOSIS — D509 Iron deficiency anemia, unspecified: Secondary | ICD-10-CM | POA: Diagnosis not present

## 2023-07-06 DIAGNOSIS — G720 Drug-induced myopathy: Secondary | ICD-10-CM | POA: Diagnosis not present

## 2023-07-06 DIAGNOSIS — T466X5A Adverse effect of antihyperlipidemic and antiarteriosclerotic drugs, initial encounter: Secondary | ICD-10-CM | POA: Diagnosis not present

## 2023-07-06 DIAGNOSIS — K219 Gastro-esophageal reflux disease without esophagitis: Secondary | ICD-10-CM | POA: Diagnosis not present

## 2023-07-06 DIAGNOSIS — E039 Hypothyroidism, unspecified: Secondary | ICD-10-CM | POA: Diagnosis not present

## 2023-07-06 DIAGNOSIS — E559 Vitamin D deficiency, unspecified: Secondary | ICD-10-CM | POA: Diagnosis not present

## 2023-07-06 DIAGNOSIS — E1165 Type 2 diabetes mellitus with hyperglycemia: Secondary | ICD-10-CM | POA: Diagnosis not present

## 2023-07-06 DIAGNOSIS — Z23 Encounter for immunization: Secondary | ICD-10-CM | POA: Diagnosis not present

## 2023-07-06 DIAGNOSIS — Z0000 Encounter for general adult medical examination without abnormal findings: Secondary | ICD-10-CM | POA: Diagnosis not present

## 2023-07-11 ENCOUNTER — Encounter: Payer: Self-pay | Admitting: Adult Health

## 2023-07-11 ENCOUNTER — Inpatient Hospital Stay: Payer: Medicare Other | Attending: Adult Health | Admitting: Adult Health

## 2023-07-11 VITALS — BP 147/68 | HR 90 | Temp 97.3°F | Resp 16 | Wt 197.4 lb

## 2023-07-11 DIAGNOSIS — Z87891 Personal history of nicotine dependence: Secondary | ICD-10-CM | POA: Insufficient documentation

## 2023-07-11 DIAGNOSIS — Z79811 Long term (current) use of aromatase inhibitors: Secondary | ICD-10-CM | POA: Insufficient documentation

## 2023-07-11 DIAGNOSIS — Z79899 Other long term (current) drug therapy: Secondary | ICD-10-CM | POA: Insufficient documentation

## 2023-07-11 DIAGNOSIS — C50812 Malignant neoplasm of overlapping sites of left female breast: Secondary | ICD-10-CM

## 2023-07-11 DIAGNOSIS — Z1732 Human epidermal growth factor receptor 2 negative status: Secondary | ICD-10-CM | POA: Diagnosis not present

## 2023-07-11 DIAGNOSIS — Z1722 Progesterone receptor negative status: Secondary | ICD-10-CM | POA: Insufficient documentation

## 2023-07-11 DIAGNOSIS — Z923 Personal history of irradiation: Secondary | ICD-10-CM | POA: Insufficient documentation

## 2023-07-11 DIAGNOSIS — Z170 Estrogen receptor positive status [ER+]: Secondary | ICD-10-CM

## 2023-07-11 DIAGNOSIS — Z807 Family history of other malignant neoplasms of lymphoid, hematopoietic and related tissues: Secondary | ICD-10-CM | POA: Insufficient documentation

## 2023-07-11 DIAGNOSIS — Z9221 Personal history of antineoplastic chemotherapy: Secondary | ICD-10-CM | POA: Insufficient documentation

## 2023-07-11 DIAGNOSIS — C50411 Malignant neoplasm of upper-outer quadrant of right female breast: Secondary | ICD-10-CM | POA: Diagnosis not present

## 2023-07-11 DIAGNOSIS — Z803 Family history of malignant neoplasm of breast: Secondary | ICD-10-CM | POA: Insufficient documentation

## 2023-07-11 DIAGNOSIS — Z171 Estrogen receptor negative status [ER-]: Secondary | ICD-10-CM | POA: Diagnosis not present

## 2023-07-11 DIAGNOSIS — Z1721 Progesterone receptor positive status: Secondary | ICD-10-CM | POA: Insufficient documentation

## 2023-07-11 DIAGNOSIS — Z9013 Acquired absence of bilateral breasts and nipples: Secondary | ICD-10-CM | POA: Diagnosis not present

## 2023-07-11 NOTE — Progress Notes (Signed)
SURVIVORSHIP VISIT:  BRIEF ONCOLOGIC HISTORY:  Oncology History  Malignant neoplasm of upper-outer quadrant of right breast in female, estrogen receptor negative (HCC)  05/14/2022 Initial Diagnosis   Screening mammogram detected bilateral breast abnormalities. 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%, PR 0%, HER2 negative, Ki-67 less than 1%), 1.4 cm mass (not biopsied) axilla negative Right breast: Calcifications 3 cm biopsy: Grade 2 IDC with DCIS ER 0%, PR 0%, HER2 negative, Ki-67 0%, axilla negative, additional distortion to be biopsied today   06/02/2022 Genetic Testing   [facility] CancerNext-Expanded Panel+RNA was Negative. Report date is 06/03/2022.  The CancerNext-Expanded gene panel offered by [facility] and includes sequencing, rearrangement, and RNA analysis for the following 77 genes: AIP, ALK, APC, ATM, AXIN2, BAP1, BARD1, BLM, BMPR1A, BRCA1, BRCA2, BRIP1, CDC73, CDH1, CDK4, CDKN1B, CDKN2A, CHEK2, CTNNA1, DICER1, FANCC, FH, FLCN, GALNT12, KIF1B, LZTR1, MAX, MEN1, MET, MLH1, MSH2, MSH3, MSH6, MUTYH, NBN, NF1, NF2, NTHL1, PALB2, PHOX2B, PMS2, POT1, PRKAR1A, PTCH1, PTEN, RAD51C, RAD51D, RB1, RECQL, RET, SDHA, SDHAF2, SDHB, SDHC, SDHD, SMAD4, SMARCA4, SMARCB1, SMARCE1, STK11, SUFU, TMEM127, TP53, TSC1, TSC2, VHL and XRCC2 (sequencing and deletion/duplication); EGFR, EGLN1, HOXB13, KIT, MITF, PDGFRA, POLD1, and POLE (sequencing only); EPCAM and GREM1 (deletion/duplication only).     07/19/2022 Surgery   Bilateral mastectomies:  Left mastectomy: Grade 2 IDC 6.8 cm with intermediate grade DCIS, margins negative, ER 100%, PR 5%, HER2 negative (2+ IHC, FISH neg), Ki-67 less than 1% 0/5 lymph nodes;  Right mastectomy: Grade 2 IDC 8.5 cm and 1.5 cm with high-grade DCIS, margins negative, ER 95% (on biopsy it was read as negative),  PR 0%, HER2 negative, Ki-67 15% 0/3 lymph nodes   07/19/2022 Cancer Staging   Staging form: Breast, AJCC 8th Edition - Pathologic stage from 07/19/2022: Stage IIIA (pT3, pN0, cM0, G3, ER-, PR-, HER2-) - Signed by Loa Socks, NP on 07/11/2023 Stage prefix: Initial diagnosis Histologic grading system: 3 grade system   08/20/2022 - 10/22/2022 Chemotherapy   Patient is on Treatment Plan : BREAST Adjuvant CMF IV q21d     02/08/2023 - 03/15/2023 Radiation Therapy   Plan Name: CW_R_BO Site: Chest Wall, Right Technique: 3D Mode: Photon Dose Per Fraction: 2 Gy Prescribed Dose (Delivered / Prescribed): 26 Gy / 26 Gy Prescribed Fxs (Delivered / Prescribed): 13 / 13   Plan Name: CW_R Site: Chest Wall, Right Technique: 3D Mode: Photon Dose Per Fraction: 2 Gy Prescribed Dose (Delivered / Prescribed): 24 Gy / 24 Gy Prescribed Fxs (Delivered / Prescribed): 12 / 12   Plan Name: CW_L_BO Site: Chest Wall, Left Technique: 3D Mode: Photon Dose Per Fraction: 2 Gy Prescribed Dose (Delivered / Prescribed): 24 Gy / 26 Gy Prescribed Fxs (Delivered / Prescribed): 12 / 13   Plan Name: CW_L Site: Chest Wall, Left Technique: 3D Mode: Photon Dose Per Fraction: 2 Gy Prescribed Dose (Delivered / Prescribed): 24 Gy / 24 Gy Prescribed Fxs (Delivered / Prescribed): 12 / 12   Plan Name: CW_L:1 Site: Chest Wall, Left Technique: 3D Mode: Photon Dose Per Fraction: 2 Gy Prescribed Dose (Delivered / Prescribed): 2 Gy / 2 Gy Prescribed Fxs (Delivered / Prescribed): 1 / 1   03/2023 -  Anti-estrogen oral therapy   Anastrozole   Malignant neoplasm of overlapping sites of left breast in female, estrogen receptor positive (HCC)  05/24/2022 Initial Diagnosis  Malignant neoplasm of overlapping sites of left breast in female, estrogen receptor positive (HCC)   05/26/2022 Cancer Staging   Staging form: Breast, AJCC 8th Edition - Clinical: Stage IB (cT2, cN0, cM0, G2, ER+, PR+, HER2-) - Signed by  Serena Croissant, MD on 05/26/2022 Histologic grading system: 3 grade system   06/02/2022 Genetic Testing   [facility] CancerNext-Expanded Panel+RNA was Negative. Report date is 06/03/2022.  The CancerNext-Expanded gene panel offered by [facility] and includes sequencing, rearrangement, and RNA analysis for the following 77 genes: AIP, ALK, APC, ATM, AXIN2, BAP1, BARD1, BLM, BMPR1A, BRCA1, BRCA2, BRIP1, CDC73, CDH1, CDK4, CDKN1B, CDKN2A, CHEK2, CTNNA1, DICER1, FANCC, FH, FLCN, GALNT12, KIF1B, LZTR1, MAX, MEN1, MET, MLH1, MSH2, MSH3, MSH6, MUTYH, NBN, NF1, NF2, NTHL1, PALB2, PHOX2B, PMS2, POT1, PRKAR1A, PTCH1, PTEN, RAD51C, RAD51D, RB1, RECQL, RET, SDHA, SDHAF2, SDHB, SDHC, SDHD, SMAD4, SMARCA4, SMARCB1, SMARCE1, STK11, SUFU, TMEM127, TP53, TSC1, TSC2, VHL and XRCC2 (sequencing and deletion/duplication); EGFR, EGLN1, HOXB13, KIT, MITF, PDGFRA, POLD1, and POLE (sequencing only); EPCAM and GREM1 (deletion/duplication only).     07/19/2022 Cancer Staging   Staging form: Breast, AJCC 8th Edition - Pathologic stage from 07/19/2022: Stage IIB (pT3, pN0, cM0, G2, ER+, PR-, HER2-) - Signed by Loa Socks, NP on 07/11/2023 Stage prefix: Initial diagnosis Histologic grading system: 3 grade system   08/20/2022 - 10/22/2022 Chemotherapy   Patient is on Treatment Plan : BREAST Adjuvant CMF IV q21d       INTERVAL HISTORY:  [name] to review her survivorship care plan detailing her treatment course for breast cancer, as well as monitoring long-term side effects of that treatment, education regarding health maintenance, screening, and overall wellness and health promotion.     Overall, [name] reports feeling quite well.  She is taking anastrozole daily.  She tolerates it well other than hot flashes that she describes as occurring all day and all night.  She is willing to stay on the medication however and see if this improves over the next couple of months.  REVIEW OF SYSTEMS:  Review of Systems   Constitutional:  Negative for appetite change, chills, fatigue, fever and unexpected weight change.  HENT:   Negative for hearing loss, lump/mass and trouble swallowing.   Eyes:  Negative for eye problems and icterus.  Respiratory:  Negative for chest tightness, cough and shortness of breath.   Cardiovascular:  Negative for chest pain, leg swelling and palpitations.  Gastrointestinal:  Negative for abdominal distention, abdominal pain, constipation, diarrhea, nausea and vomiting.  Endocrine: Positive for hot flashes.  Genitourinary:  Negative for difficulty urinating.   Musculoskeletal:  Negative for arthralgias.  Skin:  Negative for itching and rash.  Neurological:  Negative for dizziness, extremity weakness, headaches and numbness.  Hematological:  Negative for adenopathy. Does not bruise/bleed easily.  Psychiatric/Behavioral:  Negative for depression. The patient is not nervous/anxious.    Breast: Denies any new nodularity, masses, tenderness, nipple changes, or nipple discharge.       PAST MEDICAL/SURGICAL HISTORY:  Past Medical History:  Diagnosis Date   Anemia    Arthritis    Asthma    Bladder spasms    Breast cancer (HCC)    Diabetes mellitus without complication (HCC)    GERD (gastroesophageal reflux disease)    Heart murmur    Hyperlipidemia    Hypertension    Past Surgical History:  Procedure Laterality Date   BACK SURGERY  1989, 1990, 2006   BREAST BIOPSY Left 05/14/2022   [place] LT BREAST BX  W LOC DEV 1ST LESION IMG BX SPEC US GUIDE 05/14/2022 [facility]   BREAST BIOPSY Right 05/14/2022   MM RT BREAST BX W LOC DEV 1ST LESION IMAGE BX SPEC STEREO GUIDE 05/14/2022 [facility]   BREAST BIOPSY Left 05/14/2022   MM LT BREAST BX W LOC DEV 1ST LESION IMAGE BX SPEC STEREO GUIDE 05/14/2022 [facility]   BREAST BIOPSY Right 05/26/2022   MM RT BREAST BX W LOC DEV 1ST LESION IMAGE BX SPEC STEREO GUIDE 05/26/2022 [facility]   BREAST BIOPSY Right  05/26/2022   MM RT BREAST BX W LOC DEV EA AD LESION IMG BX SPEC STEREO GUIDE 05/26/2022 [facility]   BREAST LUMPECTOMY  06/14/1974   bi-lat , both benign    CHOLECYSTECTOMY  06/14/1996   IR CATHETER TUBE CHANGE  11/26/2022   IR US GUIDANCE  11/26/2022   IR US GUIDE BX ASP/DRAIN  11/12/2022   LEG SURGERY  06/15/2007   work related injury   PARTIAL HYSTERECTOMY     still has ovaries   PORT-A-CATH REMOVAL Right 01/14/2023   Procedure: REMOVAL PORT-A-CATH;  Surgeon: Griselda Miner, MD;  Location: [facility];  Service: General;  Laterality: Right;   PORTACATH PLACEMENT Right 08/12/2022   Procedure: INSERTION PORT-A-CATH;  Surgeon: Griselda Miner, MD;  Location: [facility];  Service: General;  Laterality: Right;   SENTINEL NODE BIOPSY Bilateral 07/19/2022   Procedure: BILATERAL SENTINEL NODE BIOPSY;  Surgeon: Griselda Miner, MD;  Location: [facility];  Service: General;  Laterality: Bilateral;   TOTAL MASTECTOMY Bilateral 07/19/2022   Procedure: BILATERAL TOTAL MASTECTOMY;  Surgeon: Griselda Miner, MD;  Location: [facility];  Service: General;  Laterality: Bilateral;     ALLERGIES:  Allergies  Allergen Reactions   Bactrim [Sulfamethoxazole-Trimethoprim] Hives   Codeine Phosphate Other (See Comments)    GI upset   Lipitor [Atorvastatin] Other (See Comments)    Arthralgias   Zocor [Simvastatin] Other (See Comments)    Myalgias Arthralgias   Tape Other (See Comments)    Skin tears very easily - all tape will pull off skin     CURRENT MEDICATIONS:  Outpatient Encounter Medications as of 07/11/2023  Medication Sig Note   Bempedoic Acid-Ezetimibe (NEXLIZET) 180-10 MG TABS Take 1 tablet by mouth daily.    VITAMIN D, CHOLECALCIFEROL, PO Take 5,000 Units by mouth daily.    albuterol (VENTOLIN HFA) 108 (90 Base) MCG/ACT inhaler Inhale 2 puffs into the lungs every 6 (six) hours as needed for wheezing or shortness of breath.    ALPRAZolam (XANAX) 0.5 MG tablet Take 0.5 mg by mouth 2 (two)  times daily.    amitriptyline (ELAVIL) 50 MG tablet Take 50 mg by mouth at bedtime.    anastrozole (ARIMIDEX) 1 MG tablet Take 1 tablet (1 mg total) by mouth daily.    metFORMIN (GLUCOPHAGE-XR) 500 MG 24 hr tablet Take 1,000 mg by mouth 2 (two) times daily.    MOUNJARO 5 MG/0.5ML Pen Inject 5 mg into the skin every Friday. Increasing to 10 mg on 07/29/43    ondansetron (ZOFRAN) 8 MG tablet Take 8 mg by mouth every 8 (eight) hours as needed for nausea or vomiting.    oxyCODONE (OXYCONTIN) 30 MG 12 hr tablet Take 1 tablet (30 mg total) by mouth every 12 (twelve) hours stop xtampza    pantoprazole (PROTONIX) 40 MG tablet Take 40 mg by mouth daily.    pregabalin (LYRICA) 100 MG capsule Take 1 capsule (100 mg  total) by mouth 2 (two) times daily.    [DISCONTINUED] apixaban (ELIQUIS) 5 MG TABS tablet Take 1 tablet (5 mg total) by mouth 2 (two) times daily.    [DISCONTINUED] ezetimibe (ZETIA) 10 MG tablet Take 10 mg by mouth daily.    [DISCONTINUED] oxyCODONE (OXYCONTIN) 30 MG 12 hr tablet Take 1 tablet (30 mg total) by mouth every 12 (twelve) hours (stop Xtampza)    [DISCONTINUED] OXYCONTIN 30 MG 12 hr tablet Take 30 mg by mouth every 12 (twelve) hours. 04/22/2023: Per spouse, pt changed from [place] to Oxycontin.   No facility-administered encounter medications on file as of 07/11/2023.     ONCOLOGIC FAMILY HISTORY:  Family History  Problem Relation Age of Onset   Diabetes Mother    Hypertension Mother    Heart disease Mother    Hypertension Father    Diabetes Father    Heart disease Father    Breast cancer Sister 33   Lymphoma Paternal Grandfather        dx after 67   Neuropathy Neg Hx      SOCIAL HISTORY:  Social History   Socioeconomic History   Marital status: Married    Spouse name: Ricky    Number of children: 2   Years of education: 9   Highest education level: Not on file  Occupational History   Occupation: Environmental manager  Tobacco Use   Smoking status: Former    Current  packs/day: 0.00    Average packs/day: 0.5 packs/day for 37.0 years (18.5 ttl pk-yrs)    Types: Cigarettes    Start date: 06/14/1976    Quit date: 06/14/2013    Years since quitting: 10.0   Smokeless tobacco: Never  Vaping Use   Vaping status: Never Used  Substance and Sexual Activity   Alcohol use: No   Drug use: No   Sexual activity: Never    Comment: 63 YEARS OLD, NO MORE THAN 5 PARTNERS  Other Topics Concern   Not on file  Social History Narrative   Lives at home with husband   Caffeine use: none   Social Drivers of Corporate investment banker Strain: Not on file  Food Insecurity: No Food Insecurity (04/23/2023)   Hunger Vital Sign    Worried About Running Out of Food in the Last Year: Never true    Ran Out of Food in the Last Year: Never true  Transportation Needs: No Transportation Needs (04/23/2023)   PRAPARE - Administrator, Civil Service (Medical): No    Lack of Transportation (Non-Medical): No  Physical Activity: Not on file  Stress: Not on file  Social Connections: Not on file  Intimate Partner Violence: Not At Risk (04/23/2023)   Humiliation, Afraid, Rape, and Kick questionnaire    Fear of Current or Ex-Partner: No    Emotionally Abused: No    Physically Abused: No    Sexually Abused: No     OBSERVATIONS/OBJECTIVE:  BP (!) 147/68 (BP Location: Right Leg, Patient Position: Sitting)   Pulse 90   Temp (!) 97.3 F (36.3 C) (Temporal)   Resp 16   Wt 197 lb 6.4 oz (89.5 kg)   LMP 04/22/1997   SpO2 97%   BMI 33.88 kg/m  GENERAL: Patient is a well appearing female in no acute distress HEENT:  Sclerae anicteric.  Oropharynx clear and moist. No ulcerations or evidence of oropharyngeal candidiasis. Neck is supple.  NODES:  No cervical, supraclavicular, or axillary lymphadenopathy palpated.  BREAST EXAM:  Status post bilateral mastectomies, status post bilateral radiation, no sign of local recurrence.   LUNGS:  Clear to auscultation bilaterally.  No  wheezes or rhonchi. HEART:  Regular rate and rhythm. No murmur appreciated. ABDOMEN:  Soft, nontender.  Positive, normoactive bowel sounds. No organomegaly palpated. MSK:  No focal spinal tenderness to palpation. Full range of motion bilaterally in the upper extremities. EXTREMITIES:  No peripheral edema.   SKIN:  Clear with no obvious rashes or skin changes. No nail dyscrasia. NEURO:  Nonfocal. Well oriented.  Appropriate affect.   LABORATORY DATA:  None for this visit.  DIAGNOSTIC IMAGING:  None for this visit.      ASSESSMENT AND PLAN:  Ms.. Morales is a pleasant 63 y.o. female with bilateral breast cancer, one ER positive, the other triple negative, diagnosed in 05/2022, treated with bilateral mastectomies, adjuvant chemotherapy, adjuvant radiation therapy, and anti-estrogen therapy with Anastrozole beginning in 03/2023.  She presents to the Survivorship Clinic for our initial meeting and routine follow-up post-completion of treatment for breast cancer.    1. Bilateral breast cancer:  [name] is continuing to recover from definitive treatment for breast cancer. She will follow-up with her medical oncologist, Dr.  Pamelia Hoit in 6 months with history and physical exam per surveillance protocol.  She will continue her anti-estrogen therapy with Anastrozole. Thus far, she is tolerating the Anastrozole well, with minimal side effects--see #2.  We discussed how with diabetes left breast stage II estrogen positive breast cancer she is eligible to take a pill called Ribociclib which has demonstrated a reduction in invasive disease-free survival.  She wants to read more about this before agreeing to take this and will call us with her decision.   Today, a comprehensive survivorship care plan and treatment summary was reviewed with the patient today detailing her breast cancer diagnosis, treatment course, potential late/long-term effects of treatment, appropriate follow-up care with recommendations  for the future, and patient education resources.  A copy of this summary, along with a letter will be sent to the patient's primary care provider via mail/fax/In Basket message after today's visit.    2.  Hot flashes: She is going to stay on anastrozole for couple more months and see if they start to improve.  We discussed if they do not her taking a 2-week break and then starting letrozole.  3. Bone health:  Given [name]'s age/history of breast cancer and her current treatment regimen including anti-estrogen therapy with Anastrozole, she is at risk for bone demineralization.  Tells me that she had bone density testing several years ago and it was normal.  She does not want to pursue bone density testing again at this time.  She was given education on specific activities to promote bone health.  4. Cancer screening:  Due to [name]'s history and her age, she should receive screening for skin cancers, colon cancer.  The information and recommendations are listed on the patient's comprehensive care plan/treatment summary and were reviewed in detail with the patient.    5. Health maintenance and wellness promotion: [name] was encouraged to consume 5-7 servings of fruits and vegetables per day. We reviewed the "Nutrition Rainbow" handout.  She was also encouraged to engage in moderate to vigorous exercise for 30 minutes per day most days of the week.  She was instructed to limit her alcohol consumption and continue to abstain from tobacco use.     6. Support services/counseling: It is not uncommon for this period of the  patient's cancer care trajectory to be one of many emotions and stressors.   She was given information regarding our available services and encouraged to contact me with any questions or for help enrolling in any of our support group/programs.    Follow up instructions:    -Return to cancer center in 6 months for f/u with Dr. Pamelia Hoit  -She is welcome to return back to the  Survivorship Clinic at any time; no additional follow-up needed at this time.  -Consider referral back to survivorship as a long-term survivor for continued surveillance  The patient was provided an opportunity to ask questions and all were answered. The patient agreed with the plan and demonstrated an understanding of the instructions.   Total encounter time:60 minutes*in face-to-face visit time, chart review, lab review, care coordination, order entry, and documentation of the encounter time.    Lillard Anes, NP 07/11/23 9:45 AM Medical Oncology and Hematology [facility] [address] [place], [place] [address] Tel. [phone]    Fax. [phone]  *Total Encounter Time as defined by the Centers for Medicare and Medicaid Services includes, in addition to the face-to-face time of a patient visit (documented in the note above) non-face-to-face time: obtaining and reviewing outside history, ordering and reviewing medications, tests or procedures, care coordination (communications with other health care professionals or caregivers) and documentation in the medical record.

## 2023-07-11 NOTE — Patient Instructions (Signed)
Ribociclib Tablets What is this medication? RIBOCICLIB (RYE boe SYE klib) treats breast cancer. It works by blocking a protein that causes cancer cells to grow and multiply. This helps to slow or stop the spread of cancer cells. This medicine may be used for other purposes; ask your health care provider or pharmacist if you have questions. COMMON BRAND NAME(S): KISQALI What should I tell my care team before I take this medication? They need to know if you have any of these conditions: Heart disease High or low levels of calcium, magnesium, potassium, or phosphorus in the blood Irregular heartbeat or rhythm Liver disease Low white blood cell levels Lung or breathing disease, such as asthma or COPD An unusual or allergic reaction to ribociclib, other medications, foods, dyes, or preservatives If you or your partner are pregnant or trying to get pregnant Breast-feeding How should I use this medication? Take this medication by mouth with water. Take it as directed on the prescription label at the same time every day. Do not cut, crush, or chew this medication. Swallow the tablets whole. You can take it with or without food. If it upsets your stomach, take it with food. Keep taking it unless your care team tells you to stop. Do not take this medication with grapefruit juice. This medication is taken in "cycles." There will be days you do not take it. Talk to your care team if you have questions about when to take your medication. It is very important to follow the exact schedule. Taking it more often than directed can cause serious side effects. Talk to your care team about the use of this medication in children. Special care may be needed. Overdosage: If you think you have taken too much of this medicine contact a poison control center or emergency room at once. NOTE: This medicine is only for you. Do not share this medicine with others. What if I miss a dose? If you miss a dose, skip it. Take  your next dose at the normal time. Do not take extra or 2 doses at the same time to make up for the missed dose. What may interact with this medication? Do not take this medication with any of the following: Alfuzosin Certain antivirals for HIV or hepatitis Certain medications for anxiety, such as alprazolam or triazolam Certain medications for cholesterol, such as lomitapide, lovastatin, simvastatin Certain medications for fungal infections, such as fluconazole, isavuconazonium, ketoconazole, posaconazole Certain medications for migraine headache, such as eletriptan or ubrogepant Cisapride Conivaptan Dronedarone Eplerenone Ergot alkaloids, such as dihydroergotamine or ergotamine Finerenone Flibanserin Ivabradine Levoketoconazole Lonafarnib Lurasidone Mavacamten Naloxegol Pacritinib Pimozide Ranolazine Silodosin Thioridazine Tolvaptan Voclosporin This medication may also interact with the following: Grapefruit juice Other medications that cause heart rhythm changes Tamoxifen This medication may affect how other medications work, and other medications may affect the way this medication works. Talk with your care team about all the medications you take. They may suggest changes to your treatment plan to lower the risk of side effects and to make sure your medications work as intended. This list may not describe all possible interactions. Give your health care provider a list of all the medicines, herbs, non-prescription drugs, or dietary supplements you use. Also tell them if you smoke, drink alcohol, or use illegal drugs. Some items may interact with your medicine. What should I watch for while using this medication? Your condition will be monitored carefully while you are receiving this medication. You may need blood work while taking this medication.  This medication may increase your risk of getting an infection. Call your care team for advice if you get a fever, chills, sore  throat, or other symptoms of a cold or flu. Do not treat yourself. Try to avoid being around people who are sick. Avoid taking medications that contain aspirin, acetaminophen, ibuprofen, naproxen, or ketoprofen unless instructed by your care team. These medications may hide a fever. This medication may increase your risk to bruise or bleed. Call your care team if you notice any unusual bleeding. Be careful brushing or flossing your teeth or using a toothpick because you may get an infection or bleed more easily. If you have any dental work done, tell your dentist you are receiving this medication. This medication may cause serious skin reactions. They can happen weeks to months after starting the medication. Contact your care team right away if you notice fevers or flu-like symptoms with a rash. The rash may be purple or red and then turn into blisters or peeling of the skin. You may also notice a red rash with swelling of the face, lips, or lymph nodes in your neck or under your arms. Talk to your care team if you may be pregnant. Serious birth defects can occur if you take this medication during pregnancy and for 3 weeks after the last dose. You will need a negative pregnancy test before starting this medication. Contraception is recommended while taking this medication and for 3 weeks after the last dose. Your care team can help you find the option that works for you. Do not breastfeed while taking this medication and for 3 weeks after the last dose. This medication may cause infertility. Talk to your care team if you are concerned about your fertility. What side effects may I notice from receiving this medication? Side effects that you should report to your care team as soon as possible: Allergic reactions--skin rash, itching, hives, swelling of the face, lips, tongue, or throat Dry cough, shortness of breath or trouble breathing Heart rhythm changes--fast or irregular heartbeat, dizziness, feeling  faint or lightheaded, chest pain, trouble breathing Infection--fever, chills, cough, or sore throat Liver injury--right upper belly pain, loss of appetite, nausea, light-colored stool, dark yellow or brown urine, yellowing skin or eyes, unusual weakness or fatigue Rash, fever, and swollen lymph nodes Redness, blistering, peeling, or loosening of the skin, including inside the mouth Side effects that usually do not require medical attention (report these to your care team if they continue or are bothersome): Diarrhea Fatigue Hair loss Joint pain Nausea This list may not describe all possible side effects. Call your doctor for medical advice about side effects. You may report side effects to FDA at 1-800-FDA-1088. Where should I keep my medication? Keep out of the reach of children and pets. Store at room temperature between 20 and 25 degrees C (68 and 77 degrees F). Keep this medication in the original packaging until you are ready to take it. Get rid of any unused medication after 2 months or after it expires, whichever is first. To get rid of medications that are no longer needed or have expired: Take the medication to a medication take-back program. Check with your pharmacy or law enforcement to find a location. If you cannot return the medication, check the label or package insert to see if the medication should be thrown out in the garbage or flushed down the toilet. If you are not sure, ask your care team. If it is safe to put it  in the trash, empty the medication out of the container. Mix the medication with cat litter, dirt, coffee grounds, or other unwanted substance. Seal the mixture in a bag or container. Put it in the trash. NOTE: This sheet is a summary. It may not cover all possible information. If you have questions about this medicine, talk to your doctor, pharmacist, or health care provider.  2024 [employer]vier/Gold Standard (2023-03-11 00:00:00)

## 2023-08-01 ENCOUNTER — Other Ambulatory Visit (HOSPITAL_COMMUNITY): Payer: Self-pay

## 2023-08-01 DIAGNOSIS — R079 Chest pain, unspecified: Secondary | ICD-10-CM | POA: Diagnosis not present

## 2023-08-01 DIAGNOSIS — G894 Chronic pain syndrome: Secondary | ICD-10-CM | POA: Diagnosis not present

## 2023-08-01 DIAGNOSIS — Z79891 Long term (current) use of opiate analgesic: Secondary | ICD-10-CM | POA: Diagnosis not present

## 2023-08-01 DIAGNOSIS — M961 Postlaminectomy syndrome, not elsewhere classified: Secondary | ICD-10-CM | POA: Diagnosis not present

## 2023-08-01 MED ORDER — OXYCONTIN 30 MG PO T12A
30.0000 mg | EXTENDED_RELEASE_TABLET | Freq: Two times a day (BID) | ORAL | 0 refills | Status: DC
  Filled 2023-08-29: qty 60, 30d supply, fill #0

## 2023-08-01 MED ORDER — OXYCONTIN 30 MG PO T12A
30.0000 mg | EXTENDED_RELEASE_TABLET | Freq: Two times a day (BID) | ORAL | 0 refills | Status: DC
  Filled 2023-08-01: qty 60, 30d supply, fill #0

## 2023-08-29 ENCOUNTER — Other Ambulatory Visit (HOSPITAL_COMMUNITY): Payer: Self-pay

## 2023-09-13 ENCOUNTER — Telehealth: Payer: Self-pay | Admitting: *Deleted

## 2023-09-13 NOTE — Telephone Encounter (Signed)
 Received call from pt with complaint of generalized joint pain and rash on bilateral arms. Pt states rash is starting to improve with OTC hydrocortisone cream.  Per MD, pt to stop Anastrozole x2 weeks and f/u with telephone visit. Pt educated, appt scheduled, and pt verbalized understanding.

## 2023-09-19 DIAGNOSIS — L309 Dermatitis, unspecified: Secondary | ICD-10-CM | POA: Diagnosis not present

## 2023-09-27 ENCOUNTER — Other Ambulatory Visit (HOSPITAL_COMMUNITY): Payer: Self-pay

## 2023-09-27 ENCOUNTER — Other Ambulatory Visit: Payer: Self-pay

## 2023-09-27 DIAGNOSIS — R079 Chest pain, unspecified: Secondary | ICD-10-CM | POA: Diagnosis not present

## 2023-09-27 DIAGNOSIS — Z79891 Long term (current) use of opiate analgesic: Secondary | ICD-10-CM | POA: Diagnosis not present

## 2023-09-27 DIAGNOSIS — M961 Postlaminectomy syndrome, not elsewhere classified: Secondary | ICD-10-CM | POA: Diagnosis not present

## 2023-09-27 DIAGNOSIS — G894 Chronic pain syndrome: Secondary | ICD-10-CM | POA: Diagnosis not present

## 2023-09-27 MED ORDER — PREGABALIN 100 MG PO CAPS
100.0000 mg | ORAL_CAPSULE | Freq: Two times a day (BID) | ORAL | 1 refills | Status: DC
Start: 2023-09-27 — End: 2024-01-09
  Filled 2023-09-27 – 2024-01-05 (×4): qty 60, 30d supply, fill #0

## 2023-09-27 MED ORDER — XTAMPZA ER 27 MG PO C12A
27.0000 mg | EXTENDED_RELEASE_CAPSULE | Freq: Two times a day (BID) | ORAL | 0 refills | Status: AC
  Filled 2023-09-27 (×3): qty 60, 30d supply, fill #0

## 2023-09-27 MED ORDER — XTAMPZA ER 27 MG PO C12A
1.0000 | EXTENDED_RELEASE_CAPSULE | Freq: Two times a day (BID) | ORAL | 0 refills | Status: DC
  Filled 2023-10-26: qty 60, 30d supply, fill #0

## 2023-09-28 ENCOUNTER — Inpatient Hospital Stay: Attending: Adult Health | Admitting: Hematology and Oncology

## 2023-09-28 DIAGNOSIS — C50411 Malignant neoplasm of upper-outer quadrant of right female breast: Secondary | ICD-10-CM | POA: Diagnosis not present

## 2023-09-28 DIAGNOSIS — Z171 Estrogen receptor negative status [ER-]: Secondary | ICD-10-CM | POA: Diagnosis not present

## 2023-09-28 NOTE — Progress Notes (Signed)
 HEMATOLOGY-ONCOLOGY TELEPHONE VISIT PROGRESS NOTE  I connected with our patient on 09/28/23 at 12:00 PM EDT by telephone and verified that I am speaking with the correct person using two identifiers.  I discussed the limitations, risks, security and privacy concerns of performing an evaluation and management service by telephone and the availability of in person appointments.  I also discussed with the patient that there may be a patient responsible charge related to this service. The patient expressed understanding and agreed to proceed.   History of Present Illness: Follow-up to discuss rash and back pain  History of Present Illness The patient, with a history of breast cancer, presents with a rash and joint pains. The rash, which was persistent and required a course of prednisone, has resolved since discontinuing anastrozole. She also reports new back pain, described as hurting when moving in certain ways. This pain is severe enough to cause concern and is not improving with her usual management strategies. The patient has a history of back surgery and was previously informed that no further surgical interventions could be performed. She also reports knee pain, but notes that her feet are currently symptom-free.    Oncology History  Malignant neoplasm of upper-outer quadrant of right breast in female, estrogen receptor negative (HCC)  05/14/2022 Initial Diagnosis   Screening mammogram detected bilateral breast abnormalities. 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%, PR 0%, HER2 negative, Ki-67 less than 1%), 1.4 cm mass (not biopsied) axilla negative Right breast: Calcifications 3 cm biopsy: Grade 2 IDC with DCIS ER 0%, PR 0%, HER2 negative, Ki-67 0%, axilla negative, additional distortion to be biopsied today   06/02/2022  Genetic Testing   [facility] CancerNext-Expanded Panel+RNA was Negative. Report date is 06/03/2022.  The CancerNext-Expanded gene panel offered by [facility] and includes sequencing, rearrangement, and RNA analysis for the following 77 genes: AIP, ALK, APC, ATM, AXIN2, BAP1, BARD1, BLM, BMPR1A, BRCA1, BRCA2, BRIP1, CDC73, CDH1, CDK4, CDKN1B, CDKN2A, CHEK2, CTNNA1, DICER1, FANCC, FH, FLCN, GALNT12, KIF1B, LZTR1, MAX, MEN1, MET, MLH1, MSH2, MSH3, MSH6, MUTYH, NBN, NF1, NF2, NTHL1, PALB2, PHOX2B, PMS2, POT1, PRKAR1A, PTCH1, PTEN, RAD51C, RAD51D, RB1, RECQL, RET, SDHA, SDHAF2, SDHB, SDHC, SDHD, SMAD4, SMARCA4, SMARCB1, SMARCE1, STK11, SUFU, TMEM127, TP53, TSC1, TSC2, VHL and XRCC2 (sequencing and deletion/duplication); EGFR, EGLN1, HOXB13, KIT, MITF, PDGFRA, POLD1, and POLE (sequencing only); EPCAM and GREM1 (deletion/duplication only).     07/19/2022 Surgery   Bilateral mastectomies:  Left mastectomy: Grade 2 IDC 6.8 cm with intermediate grade DCIS, margins negative, ER 100%, PR 5%, HER2 negative (2+ IHC, FISH neg), Ki-67 less than 1% 0/5 lymph nodes;  Right mastectomy: Grade 2 IDC 8.5 cm and 1.5 cm with high-grade DCIS, margins negative, ER 95% (on biopsy it was read as negative), PR 0%, HER2 negative, Ki-67 15% 0/3 lymph nodes   07/19/2022 Cancer Staging   Staging form: Breast, AJCC 8th Edition - Pathologic stage from 07/19/2022: Stage IIIA (pT3, pN0, cM0, G3, ER-, PR-, HER2-) - Signed by Loa Socks, NP on 07/11/2023 Stage prefix: Initial diagnosis Histologic grading system: 3 grade system   08/20/2022 - 10/22/2022 Chemotherapy   Patient is on Treatment Plan : BREAST Adjuvant CMF IV q21d     02/08/2023 - 03/15/2023 Radiation Therapy   Plan Name: CW_R_BO Site: Chest Wall, Right Technique: 3D Mode: Photon Dose Per Fraction: 2  Gy Prescribed Dose (Delivered / Prescribed): 26 Gy / 26 Gy Prescribed Fxs (Delivered / Prescribed): 13 / 13   Plan Name: CW_R Site: Chest Wall, Right Technique:  3D Mode: Photon Dose Per Fraction: 2 Gy Prescribed Dose (Delivered / Prescribed): 24 Gy / 24 Gy Prescribed Fxs (Delivered / Prescribed): 12 / 12   Plan Name: CW_L_BO Site: Chest Wall, Left Technique: 3D Mode: Photon Dose Per Fraction: 2 Gy Prescribed Dose (Delivered / Prescribed): 24 Gy / 26 Gy Prescribed Fxs (Delivered / Prescribed): 12 / 13   Plan Name: CW_L Site: Chest Wall, Left Technique: 3D Mode: Photon Dose Per Fraction: 2 Gy Prescribed Dose (Delivered / Prescribed): 24 Gy / 24 Gy Prescribed Fxs (Delivered / Prescribed): 12 / 12   Plan Name: CW_L:1 Site: Chest Wall, Left Technique: 3D Mode: Photon Dose Per Fraction: 2 Gy Prescribed Dose (Delivered / Prescribed): 2 Gy / 2 Gy Prescribed Fxs (Delivered / Prescribed): 1 / 1   03/2023 -  Anti-estrogen oral therapy   Anastrozole   Malignant neoplasm of overlapping sites of left breast in female, estrogen receptor positive (HCC)  05/24/2022 Initial Diagnosis   Malignant neoplasm of overlapping sites of left breast in female, estrogen receptor positive (HCC)   05/26/2022 Cancer Staging   Staging form: Breast, AJCC 8th Edition - Clinical: Stage IB (cT2, cN0, cM0, G2, ER+, PR+, HER2-) - Signed by Cameron Cea, MD on 05/26/2022 Histologic grading system: 3 grade system   06/02/2022 Genetic Testing   [facility] CancerNext-Expanded Panel+RNA was Negative. Report date is 06/03/2022.  The CancerNext-Expanded gene panel offered by [facility] and includes sequencing, rearrangement, and RNA analysis for the following 77 genes: AIP, ALK, APC, ATM, AXIN2, BAP1, BARD1, BLM, BMPR1A, BRCA1, BRCA2, BRIP1, CDC73, CDH1, CDK4, CDKN1B, CDKN2A, CHEK2, CTNNA1, DICER1, FANCC, FH, FLCN, GALNT12, KIF1B, LZTR1, MAX, MEN1, MET, MLH1, MSH2, MSH3, MSH6, MUTYH, NBN, NF1, NF2, NTHL1, PALB2, PHOX2B, PMS2, POT1, PRKAR1A, PTCH1, PTEN, RAD51C, RAD51D, RB1, RECQL, RET, SDHA, SDHAF2, SDHB, SDHC, SDHD, SMAD4, SMARCA4, SMARCB1, SMARCE1, STK11, SUFU, TMEM127,  TP53, TSC1, TSC2, VHL and XRCC2 (sequencing and deletion/duplication); EGFR, EGLN1, HOXB13, KIT, MITF, PDGFRA, POLD1, and POLE (sequencing only); EPCAM and GREM1 (deletion/duplication only).     07/19/2022 Cancer Staging   Staging form: Breast, AJCC 8th Edition - Pathologic stage from 07/19/2022: Stage IIB (pT3, pN0, cM0, G2, ER+, PR-, HER2-) - Signed by Percival Brace, NP on 07/11/2023 Stage prefix: Initial diagnosis Histologic grading system: 3 grade system   08/20/2022 - 10/22/2022 Chemotherapy   Patient is on Treatment Plan : BREAST Adjuvant CMF IV q21d       REVIEW OF SYSTEMS:   Constitutional: Denies fevers, chills or abnormal weight loss All other systems were reviewed with the patient and are negative. Observations/Objective:     Assessment Plan:  Malignant neoplasm of upper-outer quadrant of right breast in female, estrogen receptor negative (HCC) Bilateral breast cancers Screening mammogram detected bilateral breast abnormalities. 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%, PR 0%, HER2 negative, Ki-67 less than 1%), 1.4 cm mass (not biopsied) axilla negative Right breast: Calcifications 3 cm biopsy: Grade 2 IDC with DCIS ER 0%, PR 0%, HER2 negative, Ki-67 0%, axilla negative, additional distortion to be biopsied today   Treatment plan: 07/19/2022: Bilateral mastectomies:  Left mastectomy: Grade 2 IDC 6.8 cm with intermediate grade DCIS,  margins negative, ER 100%, PR 5%, HER2 negative (2+ IHC, FISH neg), Ki-67 less than 1% 0/5 lymph nodes;  Right mastectomy: Grade 2 IDC 8.5 cm and 1.5 cm with high-grade DCIS, margins negative, ER 95% (on biopsy it was read as negative), PR 0%, HER2 negative, Ki-67 15% 0/3 lymph nodes Adjuvant chemotherapy CMF x 4 cycles (selected because of her performance status) started  08/20/2022-10/22/2022 discontinued for febrile neutropenia and sepsis Postmastectomy radiation 02/08/2023-03/15/2023 Antiestrogen therapy started 03/29/2023   Patient needs an extremely unhealthy diet rich in cholesterol and fats and sugar.  She does not seem to have any interest in changing her diet. ------------------------------------------------------------------------------------------------------------------- Anastrozole Toxicities: Generalized joint pains Rash on bilateral arms: Improved with OTC hydrocortisone Anastrozole was discontinued --------------------------------- Assessment and Plan Assessment & Plan Back pain New onset back pain with concern for metastasis due to breast cancer. Previous surgical interventions exhausted. - Order bone scan to evaluate for metastasis. - Advise continuation of heat therapy. - Coordinate bone scan appointment at [facility]. - Follow up after bone scan results.  Joint pain Joint pain in knees possibly related to previous anastrozole use, now discontinued.  Rash Rash resolved after discontinuing anastrozole and prednisone course.  Bone scan coordination in progress. - Coordinate bone scan appointment at [facility]. - Follow up after bone scan results.      I discussed the assessment and treatment plan with the patient. The patient was provided an opportunity to ask questions and all were answered. The patient agreed with the plan and demonstrated an understanding of the instructions. The patient was advised to call back or seek an in-person evaluation if the symptoms worsen or if the condition fails to improve as anticipated.   I provided 20 minutes of non-face-to-face time during this encounter.  This includes time for charting and coordination of care   Margert Sheerer, MD

## 2023-09-28 NOTE — Assessment & Plan Note (Signed)
 Bilateral breast cancers Screening mammogram detected bilateral breast abnormalities. 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%, PR 0%, HER2 negative, Ki-67 less than 1%), 1.4 cm mass (not biopsied) axilla negative Right breast: Calcifications 3 cm biopsy: Grade 2 IDC with DCIS ER 0%, PR 0%, HER2 negative, Ki-67 0%, axilla negative, additional distortion to be biopsied today   Treatment plan: 07/19/2022: Bilateral mastectomies:  Left mastectomy: Grade 2 IDC 6.8 cm with intermediate grade DCIS, margins negative, ER 100%, PR 5%, HER2 negative (2+ IHC, FISH neg), Ki-67 less than 1% 0/5 lymph nodes;  Right mastectomy: Grade 2 IDC 8.5 cm and 1.5 cm with high-grade DCIS, margins negative, ER 95% (on biopsy it was read as negative), PR 0%, HER2 negative, Ki-67 15% 0/3 lymph nodes Adjuvant chemotherapy CMF x 4 cycles (selected because of her performance status) started 08/20/2022-10/22/2022 discontinued for febrile neutropenia and sepsis Postmastectomy radiation 02/08/2023-03/15/2023 Antiestrogen therapy started 03/29/2023   Patient needs an extremely unhealthy diet rich in cholesterol and fats and sugar.  She does not seem to have any interest in changing her diet. ------------------------------------------------------------------------------------------------------------------- Anastrozole Toxicities: Generalized joint pains Rash on bilateral arms: Improved with OTC hydrocortisone Anastrozole was discontinued

## 2023-09-29 ENCOUNTER — Telehealth: Payer: Self-pay | Admitting: Hematology and Oncology

## 2023-09-29 DIAGNOSIS — E1165 Type 2 diabetes mellitus with hyperglycemia: Secondary | ICD-10-CM | POA: Diagnosis not present

## 2023-09-29 DIAGNOSIS — E7800 Pure hypercholesterolemia, unspecified: Secondary | ICD-10-CM | POA: Diagnosis not present

## 2023-09-29 DIAGNOSIS — I10 Essential (primary) hypertension: Secondary | ICD-10-CM | POA: Diagnosis not present

## 2023-09-29 NOTE — Telephone Encounter (Signed)
 Left vm about scheduled appt date and time.

## 2023-10-10 ENCOUNTER — Other Ambulatory Visit (HOSPITAL_COMMUNITY)

## 2023-10-25 ENCOUNTER — Other Ambulatory Visit (HOSPITAL_COMMUNITY): Payer: Self-pay

## 2023-10-26 ENCOUNTER — Other Ambulatory Visit (HOSPITAL_COMMUNITY): Payer: Self-pay

## 2023-11-09 ENCOUNTER — Telehealth: Admitting: Hematology and Oncology

## 2023-11-16 ENCOUNTER — Encounter: Payer: Self-pay | Admitting: *Deleted

## 2023-11-16 NOTE — Progress Notes (Signed)
 Received message from central scheduling stating pt does not want to proceed with bone density at this time.  States she would like to f/u with MD and further discuss imaging during next visit.

## 2023-11-22 ENCOUNTER — Other Ambulatory Visit (HOSPITAL_COMMUNITY): Payer: Self-pay

## 2023-11-22 DIAGNOSIS — M961 Postlaminectomy syndrome, not elsewhere classified: Secondary | ICD-10-CM | POA: Diagnosis not present

## 2023-11-22 DIAGNOSIS — R079 Chest pain, unspecified: Secondary | ICD-10-CM | POA: Diagnosis not present

## 2023-11-22 DIAGNOSIS — Z79891 Long term (current) use of opiate analgesic: Secondary | ICD-10-CM | POA: Diagnosis not present

## 2023-11-22 DIAGNOSIS — G894 Chronic pain syndrome: Secondary | ICD-10-CM | POA: Diagnosis not present

## 2023-11-22 MED ORDER — PREGABALIN 100 MG PO CAPS
100.0000 mg | ORAL_CAPSULE | Freq: Three times a day (TID) | ORAL | 1 refills | Status: AC
  Filled 2023-11-23 – 2023-12-06 (×3): qty 90, 30d supply, fill #0
  Filled 2024-02-03: qty 90, 30d supply, fill #1

## 2023-11-22 MED ORDER — XTAMPZA ER 27 MG PO C12A
1.0000 | EXTENDED_RELEASE_CAPSULE | Freq: Two times a day (BID) | ORAL | 0 refills | Status: DC
  Filled 2023-11-24: qty 60, 30d supply, fill #0

## 2023-11-22 MED ORDER — XTAMPZA ER 27 MG PO C12A
1.0000 | EXTENDED_RELEASE_CAPSULE | Freq: Two times a day (BID) | ORAL | 0 refills | Status: DC
  Filled 2023-12-24: qty 60, 30d supply, fill #0
  Filled ????-??-??: fill #0

## 2023-11-23 ENCOUNTER — Other Ambulatory Visit (HOSPITAL_COMMUNITY): Payer: Self-pay

## 2023-11-23 ENCOUNTER — Other Ambulatory Visit: Payer: Self-pay

## 2023-11-24 ENCOUNTER — Other Ambulatory Visit: Payer: Self-pay

## 2023-11-24 ENCOUNTER — Other Ambulatory Visit (HOSPITAL_COMMUNITY): Payer: Self-pay

## 2023-12-01 ENCOUNTER — Other Ambulatory Visit (HOSPITAL_COMMUNITY): Payer: Self-pay

## 2023-12-06 ENCOUNTER — Other Ambulatory Visit (HOSPITAL_COMMUNITY): Payer: Self-pay

## 2023-12-24 ENCOUNTER — Other Ambulatory Visit (HOSPITAL_COMMUNITY): Payer: Self-pay

## 2023-12-26 ENCOUNTER — Other Ambulatory Visit (HOSPITAL_COMMUNITY): Payer: Self-pay

## 2023-12-26 ENCOUNTER — Other Ambulatory Visit: Payer: Self-pay

## 2023-12-27 DIAGNOSIS — D509 Iron deficiency anemia, unspecified: Secondary | ICD-10-CM | POA: Diagnosis not present

## 2023-12-27 DIAGNOSIS — I10 Essential (primary) hypertension: Secondary | ICD-10-CM | POA: Diagnosis not present

## 2023-12-27 DIAGNOSIS — E559 Vitamin D deficiency, unspecified: Secondary | ICD-10-CM | POA: Diagnosis not present

## 2023-12-27 DIAGNOSIS — E1165 Type 2 diabetes mellitus with hyperglycemia: Secondary | ICD-10-CM | POA: Diagnosis not present

## 2024-01-03 DIAGNOSIS — I10 Essential (primary) hypertension: Secondary | ICD-10-CM | POA: Diagnosis not present

## 2024-01-03 DIAGNOSIS — K219 Gastro-esophageal reflux disease without esophagitis: Secondary | ICD-10-CM | POA: Diagnosis not present

## 2024-01-03 DIAGNOSIS — R946 Abnormal results of thyroid function studies: Secondary | ICD-10-CM | POA: Diagnosis not present

## 2024-01-03 DIAGNOSIS — N390 Urinary tract infection, site not specified: Secondary | ICD-10-CM | POA: Diagnosis not present

## 2024-01-03 DIAGNOSIS — E559 Vitamin D deficiency, unspecified: Secondary | ICD-10-CM | POA: Diagnosis not present

## 2024-01-03 DIAGNOSIS — N1831 Chronic kidney disease, stage 3a: Secondary | ICD-10-CM | POA: Diagnosis not present

## 2024-01-03 DIAGNOSIS — E1165 Type 2 diabetes mellitus with hyperglycemia: Secondary | ICD-10-CM | POA: Diagnosis not present

## 2024-01-03 DIAGNOSIS — C50912 Malignant neoplasm of unspecified site of left female breast: Secondary | ICD-10-CM | POA: Diagnosis not present

## 2024-01-03 DIAGNOSIS — E7800 Pure hypercholesterolemia, unspecified: Secondary | ICD-10-CM | POA: Diagnosis not present

## 2024-01-03 DIAGNOSIS — C50911 Malignant neoplasm of unspecified site of right female breast: Secondary | ICD-10-CM | POA: Diagnosis not present

## 2024-01-04 ENCOUNTER — Encounter: Payer: Self-pay | Admitting: Registered Nurse

## 2024-01-05 ENCOUNTER — Other Ambulatory Visit (HOSPITAL_COMMUNITY): Payer: Self-pay

## 2024-01-09 ENCOUNTER — Inpatient Hospital Stay: Payer: Medicare Other | Attending: Adult Health | Admitting: Hematology and Oncology

## 2024-01-09 VITALS — BP 149/109 | HR 91 | Temp 98.2°F | Resp 17 | Wt 186.1 lb

## 2024-01-09 DIAGNOSIS — Z1732 Human epidermal growth factor receptor 2 negative status: Secondary | ICD-10-CM | POA: Insufficient documentation

## 2024-01-09 DIAGNOSIS — Z923 Personal history of irradiation: Secondary | ICD-10-CM | POA: Insufficient documentation

## 2024-01-09 DIAGNOSIS — M549 Dorsalgia, unspecified: Secondary | ICD-10-CM | POA: Diagnosis not present

## 2024-01-09 DIAGNOSIS — Z79899 Other long term (current) drug therapy: Secondary | ICD-10-CM | POA: Insufficient documentation

## 2024-01-09 DIAGNOSIS — Z171 Estrogen receptor negative status [ER-]: Secondary | ICD-10-CM | POA: Insufficient documentation

## 2024-01-09 DIAGNOSIS — Z1722 Progesterone receptor negative status: Secondary | ICD-10-CM | POA: Insufficient documentation

## 2024-01-09 DIAGNOSIS — Z170 Estrogen receptor positive status [ER+]: Secondary | ICD-10-CM | POA: Diagnosis not present

## 2024-01-09 DIAGNOSIS — Z9221 Personal history of antineoplastic chemotherapy: Secondary | ICD-10-CM | POA: Insufficient documentation

## 2024-01-09 DIAGNOSIS — E559 Vitamin D deficiency, unspecified: Secondary | ICD-10-CM | POA: Diagnosis not present

## 2024-01-09 DIAGNOSIS — C50812 Malignant neoplasm of overlapping sites of left female breast: Secondary | ICD-10-CM | POA: Insufficient documentation

## 2024-01-09 DIAGNOSIS — N921 Excessive and frequent menstruation with irregular cycle: Secondary | ICD-10-CM | POA: Diagnosis not present

## 2024-01-09 DIAGNOSIS — Z1721 Progesterone receptor positive status: Secondary | ICD-10-CM | POA: Insufficient documentation

## 2024-01-09 DIAGNOSIS — C50411 Malignant neoplasm of upper-outer quadrant of right female breast: Secondary | ICD-10-CM | POA: Diagnosis not present

## 2024-01-09 MED ORDER — EXEMESTANE 25 MG PO TABS: 25.0000 mg | ORAL_TABLET | Freq: Every day | ORAL | 0 refills | Status: DC

## 2024-01-09 NOTE — Progress Notes (Signed)
 Patient Care Team: Royden Ronal Czar, FNP as PCP - General (Internal Medicine) Odean Potts, MD as Consulting Physician (Hematology and Oncology) Curvin Deward MOULD, MD as Consulting Physician (General Surgery) Izell Domino, MD as Consulting Physician (Radiation Oncology)  DIAGNOSIS:  Encounter Diagnosis  Name Primary?   Malignant neoplasm of upper-outer quadrant of right breast in female, estrogen receptor negative (HCC) Yes    SUMMARY OF ONCOLOGIC HISTORY: Oncology History  Malignant neoplasm of upper-outer quadrant of right breast in female, estrogen receptor negative (HCC)  05/14/2022 Initial Diagnosis   Screening mammogram detected bilateral breast abnormalities. 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%, PR 0%, HER2 negative, Ki-67 less than 1%), 1.4 cm mass (not biopsied) axilla negative Right breast: Calcifications 3 cm biopsy: Grade 2 IDC with DCIS ER 0%, PR 0%, HER2 negative, Ki-67 0%, axilla negative, additional distortion to be biopsied today   06/02/2022 Genetic Testing   [facility] CancerNext-Expanded Panel+RNA was Negative. Report date is 06/03/2022.  The CancerNext-Expanded gene panel offered by [facility] and includes sequencing, rearrangement, and RNA analysis for the following 77 genes: AIP, ALK, APC, ATM, AXIN2, BAP1, BARD1, BLM, BMPR1A, BRCA1, BRCA2, BRIP1, CDC73, CDH1, CDK4, CDKN1B, CDKN2A, CHEK2, CTNNA1, DICER1, FANCC, FH, FLCN, GALNT12, KIF1B, LZTR1, MAX, MEN1, MET, MLH1, MSH2, MSH3, MSH6, MUTYH, NBN, NF1, NF2, NTHL1, PALB2, PHOX2B, PMS2, POT1, PRKAR1A, PTCH1, PTEN, RAD51C, RAD51D, RB1, RECQL, RET, SDHA, SDHAF2, SDHB, SDHC, SDHD, SMAD4, SMARCA4, SMARCB1, SMARCE1, STK11, SUFU, TMEM127, TP53, TSC1, TSC2, VHL and XRCC2 (sequencing and deletion/duplication); EGFR, EGLN1, HOXB13, KIT, MITF, PDGFRA, POLD1,  and POLE (sequencing only); EPCAM and GREM1 (deletion/duplication only).     07/19/2022 Surgery   Bilateral mastectomies:  Left mastectomy: Grade 2 IDC 6.8 cm with intermediate grade DCIS, margins negative, ER 100%, PR 5%, HER2 negative (2+ IHC, FISH neg), Ki-67 less than 1% 0/5 lymph nodes;  Right mastectomy: Grade 2 IDC 8.5 cm and 1.5 cm with high-grade DCIS, margins negative, ER 95% (on biopsy it was read as negative), PR 0%, HER2 negative, Ki-67 15% 0/3 lymph nodes   07/19/2022 Cancer Staging   Staging form: Breast, AJCC 8th Edition - Pathologic stage from 07/19/2022: Stage IIIA (pT3, pN0, cM0, G3, ER-, PR-, HER2-) - Signed by Crawford Morna Pickle, NP on 07/11/2023 Stage prefix: Initial diagnosis Histologic grading system: 3 grade system   08/20/2022 - 10/22/2022 Chemotherapy   Patient is on Treatment Plan : BREAST Adjuvant CMF IV q21d     02/08/2023 - 03/15/2023 Radiation Therapy   Plan Name: CW_R_BO Site: Chest Wall, Right Technique: 3D Mode: Photon Dose Per Fraction: 2 Gy Prescribed Dose (Delivered / Prescribed): 26 Gy / 26 Gy Prescribed Fxs (Delivered / Prescribed): 13 / 13   Plan Name: CW_R Site: Chest Wall, Right Technique: 3D Mode: Photon Dose Per Fraction: 2 Gy Prescribed Dose (Delivered / Prescribed): 24 Gy / 24 Gy Prescribed Fxs (Delivered / Prescribed): 12 / 12   Plan Name: CW_L_BO Site: Chest Wall, Left Technique: 3D Mode: Photon Dose Per Fraction: 2 Gy Prescribed Dose (Delivered / Prescribed): 24 Gy / 26 Gy Prescribed Fxs (Delivered / Prescribed): 12 / 13   Plan Name: CW_L Site: Chest Wall, Left Technique: 3D Mode: Photon Dose Per Fraction: 2 Gy Prescribed Dose (Delivered / Prescribed): 24 Gy / 24 Gy Prescribed Fxs (Delivered / Prescribed): 12 / 12  Plan Name: CW_L:1 Site: Chest Wall, Left Technique: 3D Mode: Photon Dose Per Fraction: 2 Gy Prescribed Dose (Delivered / Prescribed): 2 Gy / 2 Gy Prescribed Fxs (Delivered / Prescribed): 1 / 1   03/2023  -  Anti-estrogen oral therapy   Anastrozole    Malignant neoplasm of overlapping sites of left breast in female, estrogen receptor positive (HCC)  05/24/2022 Initial Diagnosis   Malignant neoplasm of overlapping sites of left breast in female, estrogen receptor positive (HCC)   05/26/2022 Cancer Staging   Staging form: Breast, AJCC 8th Edition - Clinical: Stage IB (cT2, cN0, cM0, G2, ER+, PR+, HER2-) - Signed by Odean Potts, MD on 05/26/2022 Histologic grading system: 3 grade system   06/02/2022 Genetic Testing   [facility] CancerNext-Expanded Panel+RNA was Negative. Report date is 06/03/2022.  The CancerNext-Expanded gene panel offered by [facility] and includes sequencing, rearrangement, and RNA analysis for the following 77 genes: AIP, ALK, APC, ATM, AXIN2, BAP1, BARD1, BLM, BMPR1A, BRCA1, BRCA2, BRIP1, CDC73, CDH1, CDK4, CDKN1B, CDKN2A, CHEK2, CTNNA1, DICER1, FANCC, FH, FLCN, GALNT12, KIF1B, LZTR1, MAX, MEN1, MET, MLH1, MSH2, MSH3, MSH6, MUTYH, NBN, NF1, NF2, NTHL1, PALB2, PHOX2B, PMS2, POT1, PRKAR1A, PTCH1, PTEN, RAD51C, RAD51D, RB1, RECQL, RET, SDHA, SDHAF2, SDHB, SDHC, SDHD, SMAD4, SMARCA4, SMARCB1, SMARCE1, STK11, SUFU, TMEM127, TP53, TSC1, TSC2, VHL and XRCC2 (sequencing and deletion/duplication); EGFR, EGLN1, HOXB13, KIT, MITF, PDGFRA, POLD1, and POLE (sequencing only); EPCAM and GREM1 (deletion/duplication only).     07/19/2022 Cancer Staging   Staging form: Breast, AJCC 8th Edition - Pathologic stage from 07/19/2022: Stage IIB (pT3, pN0, cM0, G2, ER+, PR-, HER2-) - Signed by Crawford Morna Pickle, NP on 07/11/2023 Stage prefix: Initial diagnosis Histologic grading system: 3 grade system   08/20/2022 - 10/22/2022 Chemotherapy   Patient is on Treatment Plan : BREAST Adjuvant CMF IV q21d       CHIEF COMPLIANT:   HISTORY OF PRESENT ILLNESS: Discussed the use of AI scribe software for clinical note transcription with the patient, who gave verbal consent to proceed.  History of  Present Illness [name] is a 63 year old female who presents for follow-up regarding her knee pain and medication management.  She has a history of breast cancer and has been on estradiol  for hot flashes for over ten years. Discontinuing estradiol  led to increased irritability. She is currently taking vitamin D  50,000 IU due to a deficiency identified in recent blood work. She has undergone surgery under her arms, with one side being more painful. She has had significant treatment for breast cancer, experiencing side effects from chemotherapy, and prefers non-chemotherapy treatments if needed in the future.     ALLERGIES:  is allergic to bactrim [sulfamethoxazole-trimethoprim], codeine phosphate, lipitor [atorvastatin ], zocor [simvastatin], and tape.  MEDICATIONS:  Current Outpatient Medications  Medication Sig Dispense Refill   albuterol  (VENTOLIN  HFA) 108 (90 Base) MCG/ACT inhaler Inhale 2 puffs into the lungs every 6 (six) hours as needed for wheezing or shortness of breath. 6.7 g 0   ALPRAZolam  (XANAX ) 0.5 MG tablet Take 0.5 mg by mouth 2 (two) times daily.  2   amitriptyline  (ELAVIL ) 50 MG tablet Take 50 mg by mouth at bedtime.     Bempedoic Acid-Ezetimibe  (NEXLIZET) 180-10 MG TABS Take 1 tablet by mouth daily.     exemestane  (AROMASIN ) 25 MG tablet Take 1 tablet (25 mg total) by mouth daily after breakfast. 30 tablet 0   metFORMIN  (GLUCOPHAGE -XR) 500 MG 24 hr tablet Take 1,000 mg by mouth 2 (two) times daily.  MOUNJARO 5 MG/0.5ML Pen Inject 5 mg into the skin every Friday. Increasing to 10 mg on 07/29/43     ondansetron  (ZOFRAN ) 8 MG tablet Take 8 mg by mouth every 8 (eight) hours as needed for nausea or vomiting.     oxyCODONE  ER (XTAMPZA  ER) 27 MG C12A Take 1 capsule by mouth every 12 (twelve) hours. 09/27/23 60 capsule 0   pantoprazole  (PROTONIX ) 40 MG tablet Take 40 mg by mouth daily.     pregabalin  (LYRICA ) 100 MG capsule Take 1 capsule (100 mg total) by mouth 2 (two) times  daily. 60 capsule 1   pregabalin  (LYRICA ) 100 MG capsule Take 1 capsule (100 mg total) by mouth 3 (three) times daily increased frequency 90 capsule 1   VITAMIN D , CHOLECALCIFEROL, PO Take 5,000 Units by mouth daily.     No current facility-administered medications for this visit.    PHYSICAL EXAMINATION: ECOG PERFORMANCE STATUS: 1 - Symptomatic but completely ambulatory  Vitals:   01/09/24 0918  BP: (!) 149/109  Pulse: 91  Resp: 17  Temp: 98.2 F (36.8 C)  SpO2: 96%   Filed Weights   01/09/24 0918  Weight: 186 lb 1.6 oz (84.4 kg)    LABORATORY DATA:  I have reviewed the data as listed    Latest Ref Rng & Units 04/24/2023    3:09 AM 04/23/2023    2:22 AM 04/22/2023    1:55 PM  CMP  Glucose 70 - 99 mg/dL 857  861  868   BUN 8 - 23 mg/dL 16  27  39   Creatinine 0.44 - 1.00 mg/dL 8.76  8.13  6.93   Sodium 135 - 145 mmol/L 135  134  134   Potassium 3.5 - 5.1 mmol/L 5.4  4.8  5.6   Chloride 98 - 111 mmol/L 100  102  102   CO2 22 - 32 mmol/L 27  24  19    Calcium  8.9 - 10.3 mg/dL 8.8  8.4  8.2     Lab Results  Component Value Date   WBC 4.0 04/24/2023   HGB 10.5 (L) 04/24/2023   HCT 35.4 (L) 04/24/2023   MCV 89.8 04/24/2023   PLT 177 04/24/2023   NEUTROABS 5.8 04/22/2023    ASSESSMENT & PLAN:  Malignant neoplasm of upper-outer quadrant of right breast in female, estrogen receptor negative (HCC) Bilateral breast cancers Screening mammogram detected bilateral breast abnormalities. 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%, PR 0%, HER2 negative, Ki-67 less than 1%), 1.4 cm mass (not biopsied) axilla negative Right breast: Calcifications 3 cm biopsy: Grade 2 IDC with DCIS ER 0%, PR 0%, HER2 negative, Ki-67 0%, axilla negative, additional distortion to be biopsied today   Treatment plan: 07/19/2022:  Bilateral mastectomies:  Left mastectomy: Grade 2 IDC 6.8 cm with intermediate grade DCIS, margins negative, ER 100%, PR 5%, HER2 negative (2+ IHC, FISH neg), Ki-67 less than 1% 0/5 lymph nodes;  Right mastectomy: Grade 2 IDC 8.5 cm and 1.5 cm with high-grade DCIS, margins negative, ER 95% (on biopsy it was read as negative), PR 0%, HER2 negative, Ki-67 15% 0/3 lymph nodes Adjuvant chemotherapy CMF x 4 cycles (selected because of her performance status) started 08/20/2022-10/22/2022 discontinued for febrile neutropenia and sepsis Postmastectomy radiation 02/08/2023-03/15/2023 Antiestrogen therapy started 03/29/2023   Patient needs an extremely unhealthy  diet rich in cholesterol and fats and sugar.  She does not seem to have any interest in changing her diet. ------------------------------------------------------------------------------------------------------------------- Anastrozole  Toxicities: Generalized joint pains: Improved after stopping anastrozole  Rash on bilateral arms: Improved with OTC hydrocortisone  and after discontinuing anastrozole .  We previously discussed obtaining a bone scan to evaluate the cause of her back pain.  However patient declined. Will plan for guardant reveal monitoring for MRD.  Recommended switching her to exemestane  I sent a new prescription for that.  I only sent 30-day supply.  If she is tolerating it well then she will call us  back so we can give her a 90-day supply.  Weight loss journey: Doing better on Mounjaro. Return to clinic in 1 year for follow-up ------------------------------------- Assessment and Plan Assessment & Plan Malignant neoplasm of breast Breast cancer with ER positive and negative components. Joint pain from previous medications. Exemestane  considered for recurrence prevention. Gardant test for cancer DNA monitoring. - Prescribe exemestane  if covered, 30 pills to assess tolerance. - Conduct Gardant test every six months. - Discuss  alternatives if exemestane  is intolerable or costly. - Consider imaging if Gardant test is positive. - Educated on exemestane  and Gardant test.  Knee pain Knee pain improved after stopping previous medication. Pregabalin  provides some relief. Pain likely from occupational history and injuries. - Continue pregabalin  100 mg TID. - Monitor pain and adjust treatment.  Vitamin D  deficiency Recent labs showed deficiency. Started on vitamin D  50,000 IU. - Continue vitamin D  50,000 IU. - Monitor levels and symptoms.      No orders of the defined types were placed in this encounter.  The patient has a good understanding of the overall plan. she agrees with it. she will call with any problems that may develop before the next visit here. Total time spent: 30 mins including face to face time and time spent for planning, charting and co-ordination of care   Naomi MARLA Chad, MD 01/09/24

## 2024-01-09 NOTE — Assessment & Plan Note (Signed)
 Bilateral breast cancers Screening mammogram detected bilateral breast abnormalities. 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%, PR 0%, HER2 negative, Ki-67 less than 1%), 1.4 cm mass (not biopsied) axilla negative Right breast: Calcifications 3 cm biopsy: Grade 2 IDC with DCIS ER 0%, PR 0%, HER2 negative, Ki-67 0%, axilla negative, additional distortion to be biopsied today   Treatment plan: 07/19/2022: Bilateral mastectomies:  Left mastectomy: Grade 2 IDC 6.8 cm with intermediate grade DCIS, margins negative, ER 100%, PR 5%, HER2 negative (2+ IHC, FISH neg), Ki-67 less than 1% 0/5 lymph nodes;  Right mastectomy: Grade 2 IDC 8.5 cm and 1.5 cm with high-grade DCIS, margins negative, ER 95% (on biopsy it was read as negative), PR 0%, HER2 negative, Ki-67 15% 0/3 lymph nodes Adjuvant chemotherapy CMF x 4 cycles (selected because of her performance status) started 08/20/2022-10/22/2022 discontinued for febrile neutropenia and sepsis Postmastectomy radiation 02/08/2023-03/15/2023 Antiestrogen therapy started 03/29/2023   Patient needs an extremely unhealthy diet rich in cholesterol and fats and sugar.  She does not seem to have any interest in changing her diet. ------------------------------------------------------------------------------------------------------------------- Anastrozole  Toxicities: Generalized joint pains Rash on bilateral arms: Improved with OTC hydrocortisone  and after discontinuing anastrozole .  We previously discussed obtaining a bone scan to evaluate the cause of her back pain.  However patient declined.

## 2024-01-12 DIAGNOSIS — E1169 Type 2 diabetes mellitus with other specified complication: Secondary | ICD-10-CM | POA: Diagnosis not present

## 2024-01-12 DIAGNOSIS — I10 Essential (primary) hypertension: Secondary | ICD-10-CM | POA: Diagnosis not present

## 2024-01-12 DIAGNOSIS — N1831 Chronic kidney disease, stage 3a: Secondary | ICD-10-CM | POA: Diagnosis not present

## 2024-01-12 DIAGNOSIS — E7800 Pure hypercholesterolemia, unspecified: Secondary | ICD-10-CM | POA: Diagnosis not present

## 2024-01-13 ENCOUNTER — Telehealth: Payer: Self-pay

## 2024-01-13 NOTE — Telephone Encounter (Signed)
 Per md orders entered for Guardant Reveal and all supported documents faxed to [phone]. Faxed confirmation was received.

## 2024-01-18 ENCOUNTER — Other Ambulatory Visit (HOSPITAL_COMMUNITY): Payer: Self-pay

## 2024-01-18 DIAGNOSIS — M961 Postlaminectomy syndrome, not elsewhere classified: Secondary | ICD-10-CM | POA: Diagnosis not present

## 2024-01-18 DIAGNOSIS — R079 Chest pain, unspecified: Secondary | ICD-10-CM | POA: Diagnosis not present

## 2024-01-18 DIAGNOSIS — Z79891 Long term (current) use of opiate analgesic: Secondary | ICD-10-CM | POA: Diagnosis not present

## 2024-01-18 DIAGNOSIS — G894 Chronic pain syndrome: Secondary | ICD-10-CM | POA: Diagnosis not present

## 2024-01-18 MED ORDER — PREGABALIN 100 MG PO CAPS
100.0000 mg | ORAL_CAPSULE | Freq: Three times a day (TID) | ORAL | 1 refills | Status: AC
  Filled 2024-03-02: qty 90, 30d supply, fill #0
  Filled 2024-05-07: qty 90, 30d supply, fill #1

## 2024-01-18 MED ORDER — XTAMPZA ER 27 MG PO C12A
1.0000 | EXTENDED_RELEASE_CAPSULE | Freq: Two times a day (BID) | ORAL | 0 refills | Status: AC
  Filled 2024-02-23: qty 60, 30d supply, fill #0

## 2024-01-18 MED ORDER — XTAMPZA ER 27 MG PO C12A
27.0000 mg | EXTENDED_RELEASE_CAPSULE | Freq: Two times a day (BID) | ORAL | 0 refills | Status: AC
  Filled 2024-01-24: qty 60, 30d supply, fill #0

## 2024-01-20 ENCOUNTER — Encounter: Payer: Self-pay | Admitting: *Deleted

## 2024-01-20 NOTE — Progress Notes (Signed)
 Per MD request RN placed call to pt with recent Guardant Reveal results being negative.  Pt educated and verbalized understanding.

## 2024-01-24 ENCOUNTER — Other Ambulatory Visit (HOSPITAL_COMMUNITY): Payer: Self-pay

## 2024-01-30 ENCOUNTER — Other Ambulatory Visit (HOSPITAL_COMMUNITY): Payer: Self-pay

## 2024-01-31 ENCOUNTER — Other Ambulatory Visit: Payer: Self-pay | Admitting: *Deleted

## 2024-01-31 MED ORDER — LETROZOLE 2.5 MG PO TABS: 2.5000 mg | ORAL_TABLET | Freq: Every day | ORAL | 0 refills | Status: DC

## 2024-01-31 NOTE — Progress Notes (Signed)
 Received call from pt with complaint of chest tightness and generalized muscle cramps while on Exemestane .  Pt states she stopped taking the medication for 2 weeks and feels much better with no side effects now. RN reviewed with MD and verbal orders received and placed for pt to start Letrozole  2.5 mg p.o tablet daily and f/u in office in 4-6 weeks.  Prescription sent to pharmacy on file, f/u appt scheduled, pt educated and verbalized understanding.

## 2024-02-03 ENCOUNTER — Other Ambulatory Visit (HOSPITAL_COMMUNITY): Payer: Self-pay

## 2024-02-03 ENCOUNTER — Other Ambulatory Visit: Payer: Self-pay

## 2024-02-10 DIAGNOSIS — C50911 Malignant neoplasm of unspecified site of right female breast: Secondary | ICD-10-CM | POA: Diagnosis not present

## 2024-02-14 ENCOUNTER — Other Ambulatory Visit (HOSPITAL_COMMUNITY): Payer: Self-pay

## 2024-02-15 ENCOUNTER — Telehealth: Payer: Self-pay

## 2024-02-15 NOTE — Telephone Encounter (Signed)
 Called pt per MD to advise Guardant testing was negative/not detected. Pt verbalized understanding of results and knows [facility] will be in touch to schedule 6 mo repeat lab.

## 2024-02-17 ENCOUNTER — Encounter: Payer: Self-pay | Admitting: Hematology and Oncology

## 2024-02-23 ENCOUNTER — Other Ambulatory Visit: Payer: Self-pay | Admitting: Hematology and Oncology

## 2024-02-23 ENCOUNTER — Other Ambulatory Visit (HOSPITAL_COMMUNITY): Payer: Self-pay

## 2024-03-02 ENCOUNTER — Other Ambulatory Visit (HOSPITAL_COMMUNITY): Payer: Self-pay

## 2024-03-12 ENCOUNTER — Ambulatory Visit: Admitting: Hematology and Oncology

## 2024-03-21 ENCOUNTER — Other Ambulatory Visit (HOSPITAL_COMMUNITY): Payer: Self-pay

## 2024-03-21 DIAGNOSIS — M961 Postlaminectomy syndrome, not elsewhere classified: Secondary | ICD-10-CM | POA: Diagnosis not present

## 2024-03-21 DIAGNOSIS — Z79891 Long term (current) use of opiate analgesic: Secondary | ICD-10-CM | POA: Diagnosis not present

## 2024-03-21 DIAGNOSIS — R079 Chest pain, unspecified: Secondary | ICD-10-CM | POA: Diagnosis not present

## 2024-03-21 DIAGNOSIS — G894 Chronic pain syndrome: Secondary | ICD-10-CM | POA: Diagnosis not present

## 2024-03-21 MED ORDER — XTAMPZA ER 27 MG PO C12A
27.0000 mg | EXTENDED_RELEASE_CAPSULE | Freq: Two times a day (BID) | ORAL | 0 refills | Status: AC
  Filled 2024-03-22 (×2): qty 60, 30d supply, fill #0

## 2024-03-21 MED ORDER — PREGABALIN 100 MG PO CAPS
100.0000 mg | ORAL_CAPSULE | Freq: Three times a day (TID) | ORAL | 1 refills | Status: AC
  Filled 2024-03-21 – 2024-04-02 (×2): qty 90, 30d supply, fill #0
  Filled 2024-06-05: qty 90, 30d supply, fill #1

## 2024-03-21 MED ORDER — XTAMPZA ER 27 MG PO C12A
27.0000 mg | EXTENDED_RELEASE_CAPSULE | Freq: Two times a day (BID) | ORAL | 0 refills | Status: DC
  Filled 2024-04-24: qty 60, 30d supply, fill #0

## 2024-03-22 ENCOUNTER — Other Ambulatory Visit: Payer: Self-pay

## 2024-03-22 ENCOUNTER — Other Ambulatory Visit (HOSPITAL_BASED_OUTPATIENT_CLINIC_OR_DEPARTMENT_OTHER): Payer: Self-pay

## 2024-03-22 ENCOUNTER — Other Ambulatory Visit (HOSPITAL_COMMUNITY): Payer: Self-pay

## 2024-03-28 DIAGNOSIS — Z23 Encounter for immunization: Secondary | ICD-10-CM | POA: Diagnosis not present

## 2024-04-02 ENCOUNTER — Other Ambulatory Visit (HOSPITAL_COMMUNITY): Payer: Self-pay

## 2024-04-24 ENCOUNTER — Other Ambulatory Visit (HOSPITAL_COMMUNITY): Payer: Self-pay

## 2024-05-07 ENCOUNTER — Other Ambulatory Visit (HOSPITAL_COMMUNITY): Payer: Self-pay

## 2024-05-23 ENCOUNTER — Other Ambulatory Visit: Payer: Self-pay

## 2024-05-23 ENCOUNTER — Other Ambulatory Visit (HOSPITAL_COMMUNITY): Payer: Self-pay

## 2024-05-23 MED ORDER — XTAMPZA ER 27 MG PO C12A
27.0000 mg | EXTENDED_RELEASE_CAPSULE | Freq: Two times a day (BID) | ORAL | 0 refills | Status: AC
  Filled 2024-05-23: qty 60, 30d supply, fill #0

## 2024-05-23 MED ORDER — PREGABALIN 100 MG PO CAPS
100.0000 mg | ORAL_CAPSULE | Freq: Three times a day (TID) | ORAL | 1 refills | Status: AC
  Filled 2024-05-23 – 2024-06-05 (×7): qty 90, 30d supply, fill #0
  Filled 2024-07-02: qty 90, 30d supply, fill #1

## 2024-05-23 MED ORDER — XTAMPZA ER 27 MG PO C12A
27.0000 mg | EXTENDED_RELEASE_CAPSULE | Freq: Two times a day (BID) | ORAL | 0 refills | Status: AC
  Filled 2024-06-23: qty 60, 30d supply, fill #0

## 2024-05-29 ENCOUNTER — Other Ambulatory Visit (HOSPITAL_COMMUNITY): Payer: Self-pay

## 2024-05-30 ENCOUNTER — Other Ambulatory Visit (HOSPITAL_COMMUNITY): Payer: Self-pay

## 2024-06-04 ENCOUNTER — Other Ambulatory Visit: Payer: Self-pay

## 2024-06-04 ENCOUNTER — Other Ambulatory Visit (HOSPITAL_COMMUNITY): Payer: Self-pay

## 2024-06-05 ENCOUNTER — Other Ambulatory Visit (HOSPITAL_COMMUNITY): Payer: Self-pay

## 2024-06-19 ENCOUNTER — Other Ambulatory Visit (HOSPITAL_COMMUNITY): Payer: Self-pay

## 2024-06-20 ENCOUNTER — Other Ambulatory Visit: Payer: Self-pay

## 2024-06-20 ENCOUNTER — Other Ambulatory Visit (HOSPITAL_COMMUNITY): Payer: Self-pay

## 2024-06-23 ENCOUNTER — Other Ambulatory Visit (HOSPITAL_COMMUNITY): Payer: Self-pay

## 2024-06-24 ENCOUNTER — Other Ambulatory Visit: Payer: Self-pay

## 2024-06-25 ENCOUNTER — Other Ambulatory Visit (HOSPITAL_COMMUNITY): Payer: Self-pay

## 2024-07-02 ENCOUNTER — Other Ambulatory Visit (HOSPITAL_COMMUNITY): Payer: Self-pay

## 2024-07-02 ENCOUNTER — Other Ambulatory Visit: Payer: Self-pay

## 2024-07-03 ENCOUNTER — Other Ambulatory Visit (HOSPITAL_COMMUNITY): Payer: Self-pay

## 2025-01-09 ENCOUNTER — Ambulatory Visit: Admitting: Hematology and Oncology
